# Patient Record
Sex: Female | Born: 1940
Health system: Southern US, Community
[De-identification: ages and names within clinical notes are randomized; demographics above are authoritative.]

## PROBLEM LIST (undated history)

## (undated) DIAGNOSIS — I6529 Occlusion and stenosis of unspecified carotid artery: Secondary | ICD-10-CM

## (undated) DIAGNOSIS — R011 Cardiac murmur, unspecified: Secondary | ICD-10-CM

## (undated) DIAGNOSIS — I639 Cerebral infarction, unspecified: Secondary | ICD-10-CM

## (undated) DIAGNOSIS — K920 Hematemesis: Secondary | ICD-10-CM

## (undated) DIAGNOSIS — G8190 Hemiplegia, unspecified affecting unspecified side: Secondary | ICD-10-CM

## (undated) DIAGNOSIS — E785 Hyperlipidemia, unspecified: Secondary | ICD-10-CM

## (undated) DIAGNOSIS — K2970 Gastritis, unspecified, without bleeding: Secondary | ICD-10-CM

## (undated) DIAGNOSIS — E079 Disorder of thyroid, unspecified: Secondary | ICD-10-CM

## (undated) DIAGNOSIS — K219 Gastro-esophageal reflux disease without esophagitis: Secondary | ICD-10-CM

## (undated) DIAGNOSIS — I2510 Atherosclerotic heart disease of native coronary artery without angina pectoris: Secondary | ICD-10-CM

## (undated) DIAGNOSIS — F349 Persistent mood [affective] disorder, unspecified: Secondary | ICD-10-CM

## (undated) DIAGNOSIS — I10 Essential (primary) hypertension: Secondary | ICD-10-CM

## (undated) DIAGNOSIS — K2210 Ulcer of esophagus without bleeding: Secondary | ICD-10-CM

## (undated) DIAGNOSIS — J309 Allergic rhinitis, unspecified: Secondary | ICD-10-CM

## (undated) DIAGNOSIS — I129 Hypertensive chronic kidney disease with stage 1 through stage 4 chronic kidney disease, or unspecified chronic kidney disease: Secondary | ICD-10-CM

## (undated) DIAGNOSIS — H3530 Unspecified macular degeneration: Secondary | ICD-10-CM

## (undated) DIAGNOSIS — F0390 Unspecified dementia without behavioral disturbance: Secondary | ICD-10-CM

## (undated) HISTORY — DX: Hemiplegia, unspecified affecting unspecified side: G81.90

## (undated) HISTORY — DX: Persistent mood (affective) disorder, unspecified: F34.9

## (undated) HISTORY — PX: TONSILLECTOMY: SUR1361

## (undated) HISTORY — DX: Hypertensive chronic kidney disease with stage 1 through stage 4 chronic kidney disease, or unspecified chronic kidney disease: I12.9

## (undated) HISTORY — DX: Atherosclerotic heart disease of native coronary artery without angina pectoris: I25.10

## (undated) HISTORY — PX: APPENDECTOMY: SHX54

## (undated) HISTORY — DX: Disorder of thyroid, unspecified: E07.9

## (undated) HISTORY — DX: Gastritis, unspecified, without bleeding: K29.70

## (undated) HISTORY — DX: Unspecified macular degeneration: H35.30

## (undated) HISTORY — DX: Hematemesis: K92.0

## (undated) HISTORY — DX: Occlusion and stenosis of unspecified carotid artery: I65.29

## (undated) HISTORY — DX: Ulcer of esophagus without bleeding: K22.10

## (undated) HISTORY — DX: Allergic rhinitis, unspecified: J30.9

## (undated) SURGERY — Surgical Case
Anesthesia: *Unknown

---

## 2003-06-11 ENCOUNTER — Encounter: Admission: RE | Admit: 2003-06-11 | Discharge: 2003-09-09 | Payer: Self-pay | Admitting: Internal Medicine

## 2003-09-13 ENCOUNTER — Encounter: Admission: RE | Admit: 2003-09-13 | Discharge: 2003-12-12 | Payer: Self-pay | Admitting: Internal Medicine

## 2004-01-01 ENCOUNTER — Encounter: Admission: RE | Admit: 2004-01-01 | Discharge: 2004-01-01 | Payer: Self-pay | Admitting: Internal Medicine

## 2004-02-25 ENCOUNTER — Encounter (INDEPENDENT_AMBULATORY_CARE_PROVIDER_SITE_OTHER): Payer: Self-pay | Admitting: *Deleted

## 2004-02-25 ENCOUNTER — Ambulatory Visit (HOSPITAL_COMMUNITY): Admission: RE | Admit: 2004-02-25 | Discharge: 2004-02-25 | Payer: Self-pay | Admitting: Gastroenterology

## 2004-08-02 ENCOUNTER — Emergency Department (HOSPITAL_COMMUNITY): Admission: EM | Admit: 2004-08-02 | Discharge: 2004-08-03 | Payer: Self-pay | Admitting: Emergency Medicine

## 2004-08-19 ENCOUNTER — Emergency Department (HOSPITAL_COMMUNITY): Admission: EM | Admit: 2004-08-19 | Discharge: 2004-08-19 | Payer: Self-pay | Admitting: Emergency Medicine

## 2005-07-03 ENCOUNTER — Encounter: Admission: RE | Admit: 2005-07-03 | Discharge: 2005-07-03 | Payer: Self-pay | Admitting: Internal Medicine

## 2006-08-24 ENCOUNTER — Encounter: Admission: RE | Admit: 2006-08-24 | Discharge: 2006-08-24 | Payer: Self-pay | Admitting: Internal Medicine

## 2007-08-13 ENCOUNTER — Emergency Department (HOSPITAL_COMMUNITY): Admission: EM | Admit: 2007-08-13 | Discharge: 2007-08-13 | Payer: Self-pay | Admitting: Emergency Medicine

## 2007-09-03 ENCOUNTER — Encounter: Admission: RE | Admit: 2007-09-03 | Discharge: 2007-09-03 | Payer: Self-pay | Admitting: Neurosurgery

## 2008-09-07 ENCOUNTER — Encounter: Admission: RE | Admit: 2008-09-07 | Discharge: 2008-09-07 | Payer: Self-pay | Admitting: Gastroenterology

## 2009-01-08 ENCOUNTER — Encounter: Admission: RE | Admit: 2009-01-08 | Discharge: 2009-01-08 | Payer: Self-pay | Admitting: Family Medicine

## 2009-01-16 ENCOUNTER — Encounter: Admission: RE | Admit: 2009-01-16 | Discharge: 2009-01-16 | Payer: Self-pay | Admitting: Family Medicine

## 2009-02-06 ENCOUNTER — Ambulatory Visit: Payer: Self-pay | Admitting: Vascular Surgery

## 2009-02-15 ENCOUNTER — Ambulatory Visit: Payer: Self-pay | Admitting: Vascular Surgery

## 2009-02-15 ENCOUNTER — Ambulatory Visit (HOSPITAL_COMMUNITY): Admission: RE | Admit: 2009-02-15 | Discharge: 2009-02-15 | Payer: Self-pay | Admitting: Vascular Surgery

## 2009-02-15 DIAGNOSIS — I6529 Occlusion and stenosis of unspecified carotid artery: Secondary | ICD-10-CM

## 2009-02-15 HISTORY — DX: Occlusion and stenosis of unspecified carotid artery: I65.29

## 2009-03-04 ENCOUNTER — Encounter: Admission: RE | Admit: 2009-03-04 | Discharge: 2009-03-04 | Payer: Self-pay | Admitting: Family Medicine

## 2009-08-28 ENCOUNTER — Ambulatory Visit: Payer: Self-pay | Admitting: Vascular Surgery

## 2009-12-05 ENCOUNTER — Encounter (HOSPITAL_BASED_OUTPATIENT_CLINIC_OR_DEPARTMENT_OTHER): Admission: RE | Admit: 2009-12-05 | Discharge: 2010-03-04 | Payer: Self-pay | Admitting: Internal Medicine

## 2009-12-23 ENCOUNTER — Ambulatory Visit: Payer: Self-pay | Admitting: Surgery

## 2010-03-25 ENCOUNTER — Encounter: Admission: RE | Admit: 2010-03-25 | Discharge: 2010-03-25 | Payer: Self-pay | Admitting: Family Medicine

## 2010-08-11 ENCOUNTER — Encounter: Payer: Self-pay | Admitting: Family Medicine

## 2010-09-11 ENCOUNTER — Other Ambulatory Visit: Payer: Self-pay

## 2010-09-25 ENCOUNTER — Other Ambulatory Visit (INDEPENDENT_AMBULATORY_CARE_PROVIDER_SITE_OTHER): Payer: Medicare Other

## 2010-09-25 DIAGNOSIS — I6529 Occlusion and stenosis of unspecified carotid artery: Secondary | ICD-10-CM

## 2010-10-03 NOTE — Procedures (Unsigned)
CAROTID DUPLEX EXAM  INDICATION:  Follow up carotid artery disease.  HISTORY: Diabetes:  Yes. Cardiac:  Heart murmur. Hypertension:  Yes. Smoking:  Previous. Previous Surgery:  No. CV History:  Patient is currently asymptomatic. Amaurosis Fugax No, Paresthesias No, Hemiparesis No.                                      RIGHT             LEFT Brachial systolic pressure:         160               162 Brachial Doppler waveforms:         WNL               WNL Vertebral direction of flow:        Antegrade         Antegrade DUPLEX VELOCITIES (cm/sec) CCA peak systolic                   33                55 ECA peak systolic                   106               107 ICA peak systolic                   Occluded          133 ICA end diastolic                                     48 PLAQUE MORPHOLOGY:                  Heterogenous      Heterogenous PLAQUE AMOUNT:                      Severe            Mild-to-moderate PLAQUE LOCATION:                    ICA               ICA, ECA  IMPRESSION: 1. Absent end-diastolic velocity within the right common carotid     artery, suggestive of stenosis distally. 2. Occluded right internal carotid with minimal flow detected     proximally. 3. Bilateral intimal thickening within the common carotid arteries. 4. Increased velocities within the right vertebral at 2.63 m/s,     suggestive of compensation flow due to occlusion.  The right     vertebral is prominent compared to the left. 5. The left carotid system appears hyperemic compared to the right,     suggestive of occlusion on the contralateral side. 6. The left internal carotid artery is 40% to 59% stenosis by Doppler     criteria distally, again suggestive of compensation due to     occlusion of the contralateral side. 7. Study is stable compared to previous.  ___________________________________________ Janetta Hora Fields, MD  OD/MEDQ  D:  09/25/2010  T:  09/25/2010   Job:  [id number]

## 2010-10-26 LAB — POCT I-STAT, CHEM 8
BUN: 29 mg/dL — ABNORMAL HIGH (ref 6–23)
Chloride: 106 mEq/L (ref 96–112)
Creatinine, Ser: 1.3 mg/dL — ABNORMAL HIGH (ref 0.4–1.2)
Glucose, Bld: 71 mg/dL (ref 70–99)
Hemoglobin: 14.3 g/dL (ref 12.0–15.0)
Potassium: 3.4 mEq/L — ABNORMAL LOW (ref 3.5–5.1)
Sodium: 141 mEq/L (ref 135–145)

## 2010-10-26 LAB — GLUCOSE, CAPILLARY

## 2010-12-02 NOTE — Op Note (Signed)
NAMEEVERLEY, EVORA              ACCOUNT NO.:  [id number]   MEDICAL RECORD NO.:  [id number]          PATIENT TYPE:  AMB   LOCATION:  SDS                          FACILITY:  [facility]   PHYSICIAN:  Charles E. Fields, MD  DATE OF BIRTH:  Dec 10, 1940   DATE OF PROCEDURE:  02/15/2009  DATE OF DISCHARGE:  02/15/2009                               OPERATIVE REPORT   PROCEDURES:  1. Arch aortogram.  2. Right and left carotid angiogram.   PREOPERATIVE DIAGNOSIS:  Possible right internal carotid artery  occlusion.   POSTOPERATIVE DIAGNOSIS:  Patent right internal carotid artery with  diffuse non-reconstructable distal disease.   INDICATIONS:  The patient has a history of prior stroke.  Recent carotid  duplex exam suggested a right internal carotid artery occlusion, but  this could not be definitively determined.  The patient presents today  for carotid angiogram for further delineation of her carotid anatomy.   OPERATIVE DETAILS:  After obtaining informed consent, the patient was  taken to the [facility] lab.  The patient was placed in supine position on the  angio table.  Right groin was prepped and draped in usual sterile  fashion.  Local anesthesia was infiltrated over the right common femoral  artery.  An introducer needle was used to cannulate the right common  femoral artery and a 0.035 Wholey wire advanced into the abdominal aorta  under fluoroscopic guidance.  Next, a 5-French sheath was placed over  the guidewire in the right common femoral artery.  This was thoroughly  flushed with heparinized saline.  A 5-French pigtail catheter was then  placed over the guidewire and these were advanced as a unit up into the  aortic arch.  Arch aortogram was obtained.  This shows a patent  innominate left common carotid and left subclavian artery.  The left  vertebral artery fills antegrade.  The right vertebral artery is quite  large approximately twice the diameter of the left vertebral artery, and  this was also patent.  The origin of the right common carotid artery is  also patent.   Next, the 5-French pigtail catheter was removed over guidewire and an H1  catheter advanced up into the aortic arch and the right innominate  artery was selectively catheterized.  Attempts were made using the H1  catheter to selectively catheterize the right common carotid artery;  however, the catheter kept advancing into the subclavian artery.  Therefore, the H1 catheter was removed over a guidewire and this was  exchanged for a Berenstein catheter.  The Berenstein catheter was easily  advanced up into the innominate artery and angiogram was performed,  which showed that the tip of the catheter was actually in the right  subclavian artery.  The vertebral artery was patent at its origin and  was quite large with multiple segments of 25%-50% stenosis in its  midportion.  The Berenstein catheter was thoroughly irrigated and  flushed after aspiration of blood to clear the catheter.  Catheter was  then pulled back down into the innominate artery and I was at this point  able to selectively catheterize the right  common carotid artery.  Selective injection of the right common carotid artery was obtained in  an AP and lateral projection.  Intracranial views in an AP and lateral  projection were also obtained.  Oblique views were also obtained of the  carotid artery to further define the anatomy.  This shows a patent right  common carotid artery and a patent, but diffusely diseased right  internal carotid artery.  The carotid bifurcation fills briskly.  However, the vessel distally is quite small and diffusely diseased.  There is minimal filling of the right cerebral hemisphere.  The vessel  is less than 1 mm in diameter and this diameter extends from the level  of the mandible all the way into the intracranial portion of the  internal carotid artery.  Again on several projections, there is minimal   bifurcation stenosis of the right carotid system, diffusely diseased  multisegmental stenosis, unreconstructable right internal carotid  artery.  The best view is the oblique view, which shows again a patent  right internal carotid artery with diffuse disease distally.   Next, the Berenstein catheter was pulled back down the aortic arch and  the left common carotid artery was selectively catheterized.  AP and  lateral projections were obtained of the carotid system as well as the  intracranial views.  The intracranial views to be interpreted later by  the neuroradiologist.  The left carotid bifurcation has minimal  stenosis.  There is a slight ulceration of the proximal aspect of this,  approximately 1 mm in depth.  The distal internal carotid artery is  relatively disease-free.   At this point, the Berenstein catheter was pulled back over a guidewire  and removed.  The 5-French sheath was thoroughly flushed with  heparinized saline.  Sheath was left in place to be pulled in the  holding area.  The patient tolerated the procedure well and there were  no complications.  The patient was taken to the holding area in stable  condition and at her neurologic baseline.   OPERATIVE FINDINGS:  1. Diffusely diseased right internal carotid artery with no      significant bifurcation stenosis, but unreconstructable distal      right internal carotid artery occlusive disease.  2. No significant left internal carotid artery stenosis.  3. Normal arch anatomy with no significant inflow stenosis.  4. Patent bilateral antegrade vertebral arteries with right vertebral      artery twice the diameter of the left vertebral artery and      dominant.      Janetta Hora. Fields, MD  Electronically Signed     CEF/MEDQ  D:  02/18/2009  T:  02/19/2009  Job:  [id number]

## 2010-12-02 NOTE — Assessment & Plan Note (Signed)
OFFICE VISIT   SANYIAH, KANZLER  DOB:  1940/10/13                                       02/06/2009  CHART#:[id number]   Ms. Krasner is a 70 year old female referred for evaluation of carotid  disease.  In 1998 she had a stroke which manifested itself as a right  hemiplegia.  She has no real significant residual at this point.  She  denies any symptoms of TIA, amaurosis or stroke recently.  She does have  a history of diabetes since 1998.  She has a history of smoking but quit  20 years ago.  Other atherosclerotic risk factors include elevated  cholesterol, hypertension.   PAST SURGICAL HISTORY:  She had a laser ablation in her right eye.  She  denies any other operations.   MEDICATIONS:  1. Glipizide 10 mg twice a day.  2. Metformin 500 with mg twice a day.  3. Pravastatin 20 mg half-tablet once a day.  4. Plavix 75 mg every other day.  5. Metoprolol 50 mg once a day.  6. Omeprazole 40 mg once a day.  7. Multivitamin.   ALLERGIES:  She states allergy to Allegra which causes swelling and  itching.   PAST MEDICAL HISTORY:  Also significant for macular degeneration.   FAMILY HISTORY:  Unremarkable.   SOCIAL HISTORY:  She is single.  Smoking history is as mentioned above.  She does not consume alcohol.   REVIEW OF SYSTEMS:  She is 4 feet 10 inches and 159 pounds.  She has had  some recent anorexia.  CARDIAC:  She has shortness of breath when lying flat occasionally.  GI:  She has some occasional dysphagia, diarrhea, constipation.  RENAL:  She has some urinary frequency.  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  ORTHOPEDIC:  She has multiple joint pain in her hands.  ENT:  She has some recent changes in her eye sight and hearing, both of  which are in decline.   PHYSICAL EXAM:  Vital signs:  Blood pressure 149/81 in the left arm,  138/67 right arm, pulse 75 and regular.  HEENT:  Unremarkable.   Neck has  2+ carotid pulses without bruit.  Chest:  Clear to auscultation.  Cardiac exam is regular rate and rhythm without murmur.  Abdomen:  Soft,  nontender, nondistended.  No masses.  Extremities:  She has 2+ brachial,  radial pulses bilaterally.  She has 2+ femoral pulses bilaterally.  She  has absent popliteal and pedal pulses bilaterally.  Neurologic:  Exam  shows upper extremity and lower extremity motor strength is essentially  5/5 with some slight discoordination of her right hand.  She also has a  mild right facial droop.   I reviewed her carotid duplex exam from [facility] dated January 16, 2009.  This suggested a 40-60% left internal carotid artery stenosis  with possible right internal carotid artery occlusion.  However, they  could not determine whether or not this was critical stenosis or  occlusion.   I believe the best option for Ms. Alton would be to do a carotid  arteriogram to make sure that the artery is occluded rather than just a  high-grade stenosis.  I did discuss with the patient and her daughter  today risks, benefits, possible complications of carotid arteriogram  including not limited to bleeding, infection, possible stroke.  They  understand and agree to proceed.  If the artery is occluded I do not  believe she would need any further intervention, if the artery has a  high-grade stenosis we need to consider carotid endarterectomy.  As far  as her lower extremities are concerned she did have absent popliteal and  pedal pulses bilaterally.  Does occasionally describe some pain in her  feet.  I do not believe she has critical limb ischemia but I believe  that she would benefit from bilateral ABIs and we will schedule this in  six months' time at time of a repeat carotid duplex exam.  We will  schedule a carotid duplex exam in advance, whether or not this would be  necessary will be based on her arteriographic findings.   Janetta Hora. Fields, MD   Electronically Signed   CEF/MEDQ  D:  02/07/2009  T:  02/07/2009  Job:  [id number]   cc:   Dr. Elizabeth Palau, [facility]

## 2010-12-02 NOTE — Procedures (Signed)
CAROTID DUPLEX EXAM   INDICATION:  Followup carotid artery disease.   HISTORY:  Diabetes:  Yes.  Cardiac:  No.  Hypertension:  Yes.  Smoking:  Previous.  Previous Surgery:  No.  CV History:  Stroke in 1998, asymptomatic now.  Amaurosis Fugax No, Paresthesias No, Hemiparesis No                                       RIGHT             LEFT  Brachial systolic pressure:         136               142  Brachial Doppler waveforms:         WNL               WNL  Vertebral direction of flow:        Antegrade         Antegrade  DUPLEX VELOCITIES (cm/sec)  CCA peak systolic                   42                67  ECA peak systolic                   79                109  ICA peak systolic                   55                131  ICA end diastolic                   8                 46  PLAQUE MORPHOLOGY:                  Homogeneous       Calcific  PLAQUE AMOUNT:                      Severe            Mild / moderate  PLAQUE LOCATION:                    ICA / ECA / CCA   ICA / ECA / CCA   IMPRESSION:  1. Right internal carotid artery shows evidence of narrow flow channel      with low, abnormal flow, consistent with angiogram 02/15/2009.  2. Left internal carotid artery shows evidence of 40%-59% stenosis      (low end of range).   ___________________________________________  Janetta Hora Fields, MD   AS/MEDQ  D:  08/28/2009  T:  08/28/2009  Job:  [id number]

## 2010-12-02 NOTE — Procedures (Signed)
DUPLEX DEEP VENOUS EXAM - LOWER EXTREMITY   INDICATION:  Questionable venous insufficiency.   HISTORY:  Edema:  No.  Trauma/Surgery:  Bruises on bilateral knees from a fall.  Pain:  Yes.  PE:  No.  Previous DVT:  No.  Anticoagulants:  No.  Other:  No.   DUPLEX EXAM:                CFV   SFV   PopV  PTV    GSV                R  L  R  L  R  L  R   L  R  L  Thrombosis    o  o  o  o  o  o  o   o  o  o  Spontaneous   +  +  +  +  +  +  +   +  +  +  Phasic        +  +  +  +  +  +  +   +  +  +  Augmentation  +  +  +  +  +  +  +   +  +  +  Compressible  +  +  +  +  +  +  +   +  +  +  Competent     +  +  +  +  +  +  +   +  +  0   Legend:  + - yes  o - no  p - partial  D - decreased   IMPRESSION:  There does not appear to be any evidence of deep venous  thrombus noted in the bilateral lower extremities.  There does appear to  be reflux noted in the left greater saphenous vein.    _____________________________  V. Charlena Cross, MD   CB/MEDQ  D:  12/23/2009  T:  12/23/2009  Job:  [id number]

## 2010-12-02 NOTE — Consult Note (Signed)
NAMELEGACY, CARRENDER              ACCOUNT NO.:  [id number]   MEDICAL RECORD NO.:  [id number]          PATIENT TYPE:  AMB   LOCATION:  SDS                          FACILITY:  [facility]   PHYSICIAN:  Marin Roberts, MDDATE OF BIRTH:  Aug 28, 1940   DATE OF CONSULTATION:  DATE OF DISCHARGE:  02/15/2009                                 CONSULTATION   REFERRING PHYSICIAN:  Janetta Hora. Fields, MD   REASON FOR CONSULTATION:  Intracranial interpretation of bilateral  carotid arteriogram, performed on February 15, 2009.   FINDINGS:  Injection of the right common carotid artery demonstrates a  markedly narrowed high cervical and skull base ICA.  There is focal  narrowing within the petrous segment, which may be slightly greater than  50%.  The supraclinoid right internal carotid artery terminates into  small branch vessels.  These do not have the appearance of the middle  cerebral artery, but rather small collateral vessels, compatible with a  moyamoya picture.  The ophthalmic artery is visualized.  No significant  A1 or M1 segment is noted.  The right external carotid artery branches  are intact.  There is no significant pial collateralization.  There is  some blush of contrast distal to the ICA, again compatible with moyamoya  picture.   Injection of the left common carotid artery demonstrates a normal distal  ICA segment within the skull base.  The A1 and M1 segments are normal.  The anterior communicating artery is patent.  The distal ACA vessels on  the right are normal.  There is some collateralization to MCA branch  vessels from the right ACA vessels.  The dural sinuses fill normally.  The left MCA branch vessels are unremarkable.   IMPRESSIONS:  1. Essentially occluded distal ICA at the level of the ICA terminus      with 2 small branch vessels and some blush, suggesting moyamoya.  2. Diffuse narrowing of the entire internal carotid artery, likely due      to a low-perfusion state  relative to the distal occlusion.  3. Potential high-grade stenosis within the petrous segment.  This is      not the limiting lesion.  4. Collateral flow to MCA branches from the right ACA branches which      fill via a patent anterior communicating artery.  The collateral      flow is via pial collaterals.  No intact right A1 segment is      identified.  5. No significant left-sided disease.      Marin Roberts, MD  Electronically Signed     CM/MEDQ  D:  02/15/2009  T:  02/16/2009  Job:  [id number]

## 2010-12-05 NOTE — Op Note (Signed)
[name] [name], [name] [name]                          ACCOUNT NO.:  [id number]   MEDICAL RECORD NO.:  [id number]                   PATIENT TYPE:  AMB   LOCATION:  ENDO                                 FACILITY:  [facility]   PHYSICIAN:  Anselmo Rod, M.D.               DATE OF BIRTH:  12-20-1940   DATE OF PROCEDURE:  02/25/2004  DATE OF DISCHARGE:                                 OPERATIVE REPORT   PROCEDURE PERFORMED:  Colonoscopy with biopsies x3.   ENDOSCOPIST:  Anselmo Rod, M.D.   INSTRUMENT USED:  Olympus video colonoscope.   INDICATION FOR PROCEDURE:  A 70 year old African-American female undergoing  a screening colonoscopy.  Rule out colonic polyps, masses, etc.   PREPROCEDURE PREPARATION:  Informed consent was procured from the patient.  The patient was fasted for eight hours prior to the procedure and prepped  with a bottle of magnesium citrate and a gallon of GoLYTELY the night prior  to the procedure.   PREPROCEDURE PHYSICAL:  VITAL SIGNS:  The patient had stable vital signs.  NECK:  Supple.  CHEST:  Clear to auscultation.  S1, S2 regular.  ABDOMEN:  Soft with normal bowel sounds.   DATE OF PROCEDURE:  The patient was placed in the left lateral decubitus  position and sedated with 60 mg of Demerol and 6 mg of Versed in slow  incremental doses.  Once the patient was adequately sedate and maintained on  low-flow oxygen and continuous cardiac monitoring, the Olympus video  colonoscope was advanced from the rectum to the cecum.  The appendiceal  orifice and the ileocecal valve were clearly visualized and photographed.  There was a large amount of residual stool in the colon.  Multiple washes  were done.  The patient's position was changed from the left lateral to the  supine position with gentle application of abdominal pressure to visualize  the rest of the colonic mucosa.  A small sessile polyp was biopsied from the  cecal base, but no other abnormalities were noted.   Retroflexion in the  rectum revealed small internal hemorrhoids.  There was no evidence of  diverticulosis.  The patient tolerated the procedure well without  complications.   IMPRESSION:  1. Small, nonbleeding internal hemorrhoids.  2. Small sessile polyp biopsied from the cecal base.  3. Large amount of residual stool in the colon, small lesions could have     been missed.   RECOMMENDATIONS:  1. Await pathology results.  2. Avoid nonsteroidals, including aspirin, for the next two weeks.  3. Outpatient follow-up in the next two weeks for further recommendations.     The patient is advised to restart her Plavix on February 27, 2004.  Anselmo Rod, M.D.    JNM/MEDQ  D:  02/25/2004  T:  02/25/2004  Job:  [id number]   cc:   Merlene Laughter. Renae Gloss, M.D.  [address]  [address]  [place]  [place] [address]  Fax: [phone]

## 2011-03-02 ENCOUNTER — Other Ambulatory Visit: Payer: Self-pay | Admitting: Family Medicine

## 2011-03-02 DIAGNOSIS — Z1231 Encounter for screening mammogram for malignant neoplasm of breast: Secondary | ICD-10-CM

## 2011-03-31 ENCOUNTER — Ambulatory Visit
Admission: RE | Admit: 2011-03-31 | Discharge: 2011-03-31 | Disposition: A | Discharge status: Home / Self Care | Payer: Medicare Other | Source: Ambulatory Visit | Attending: Family Medicine | Admitting: Family Medicine

## 2011-03-31 DIAGNOSIS — Z1231 Encounter for screening mammogram for malignant neoplasm of breast: Secondary | ICD-10-CM

## 2011-04-10 LAB — CBC
Hemoglobin: 13.2
MCV: 82.4
RBC: 4.7
WBC: 7.3

## 2011-04-10 LAB — BASIC METABOLIC PANEL
Chloride: 107
Creatinine, Ser: 1.14
GFR calc Af Amer: 58 — ABNORMAL LOW
Sodium: 140

## 2011-04-20 ENCOUNTER — Ambulatory Visit: Payer: Medicare Other | Attending: Family Medicine

## 2011-04-20 DIAGNOSIS — R5381 Other malaise: Secondary | ICD-10-CM | POA: Insufficient documentation

## 2011-04-20 DIAGNOSIS — R262 Difficulty in walking, not elsewhere classified: Secondary | ICD-10-CM | POA: Insufficient documentation

## 2011-04-20 DIAGNOSIS — IMO0001 Reserved for inherently not codable concepts without codable children: Secondary | ICD-10-CM | POA: Insufficient documentation

## 2011-05-27 ENCOUNTER — Emergency Department (HOSPITAL_COMMUNITY)
Admission: EM | Admit: 2011-05-27 | Discharge: 2011-05-27 | Disposition: A | Discharge status: Home / Self Care | Payer: Medicare Other | Attending: Emergency Medicine | Admitting: Emergency Medicine

## 2011-05-27 ENCOUNTER — Emergency Department (HOSPITAL_COMMUNITY): Payer: Medicare Other

## 2011-05-27 DIAGNOSIS — Z8673 Personal history of transient ischemic attack (TIA), and cerebral infarction without residual deficits: Secondary | ICD-10-CM | POA: Insufficient documentation

## 2011-05-27 DIAGNOSIS — E1165 Type 2 diabetes mellitus with hyperglycemia: Secondary | ICD-10-CM

## 2011-05-27 DIAGNOSIS — I10 Essential (primary) hypertension: Secondary | ICD-10-CM | POA: Insufficient documentation

## 2011-05-27 DIAGNOSIS — E119 Type 2 diabetes mellitus without complications: Secondary | ICD-10-CM | POA: Insufficient documentation

## 2011-05-27 DIAGNOSIS — N289 Disorder of kidney and ureter, unspecified: Secondary | ICD-10-CM | POA: Insufficient documentation

## 2011-05-27 DIAGNOSIS — R011 Cardiac murmur, unspecified: Secondary | ICD-10-CM | POA: Insufficient documentation

## 2011-05-27 HISTORY — DX: Gastro-esophageal reflux disease without esophagitis: K21.9

## 2011-05-27 HISTORY — DX: Essential (primary) hypertension: I10

## 2011-05-27 HISTORY — DX: Cerebral infarction, unspecified: I63.9

## 2011-05-27 HISTORY — DX: Hyperlipidemia, unspecified: E78.5

## 2011-05-27 LAB — BASIC METABOLIC PANEL
BUN: 40 mg/dL — ABNORMAL HIGH (ref 6–23)
Chloride: 96 mEq/L (ref 96–112)
Creatinine, Ser: 1.36 mg/dL — ABNORMAL HIGH (ref 0.50–1.10)
GFR calc non Af Amer: 38 mL/min — ABNORMAL LOW (ref >90)
Glucose, Bld: 479 mg/dL — ABNORMAL HIGH (ref 70–99)
Potassium: 4 mEq/L (ref 3.5–5.1)

## 2011-05-27 LAB — URINALYSIS, ROUTINE W REFLEX MICROSCOPIC
Bilirubin Urine: NEGATIVE
Ketones, ur: 15 mg/dL — AB
Specific Gravity, Urine: 1.01 (ref 1.005–1.030)
pH: 6 (ref 5.0–8.0)

## 2011-05-27 LAB — DIFFERENTIAL
Lymphs Abs: 1.5 10*3/uL (ref 0.7–4.0)
Monocytes Relative: 7 % (ref 3–12)
Neutro Abs: 4.2 10*3/uL (ref 1.7–7.7)
Neutrophils Relative %: 67 % (ref 43–77)

## 2011-05-27 LAB — CBC
HCT: 39.6 % (ref 36.0–46.0)
Hemoglobin: 13.5 g/dL (ref 12.0–15.0)
MCH: 27.7 pg (ref 26.0–34.0)
RBC: 4.87 MIL/uL (ref 3.87–5.11)

## 2011-05-27 LAB — GLUCOSE, CAPILLARY: Glucose-Capillary: 486 mg/dL — ABNORMAL HIGH (ref 70–99)

## 2011-05-27 LAB — URINE MICROSCOPIC-ADD ON

## 2011-05-27 MED ORDER — SODIUM CHLORIDE 0.9 % IV BOLUS (SEPSIS)
1000.0000 mL | Freq: Once | INTRAVENOUS | Status: AC
  Administered 2011-05-27: 1000 mL via INTRAVENOUS

## 2011-05-27 MED ORDER — INSULIN GLARGINE 100 UNIT/ML ~~LOC~~ SOLN
SUBCUTANEOUS | Status: AC
  Filled 2011-05-27: qty 1

## 2011-05-27 MED ORDER — SODIUM CHLORIDE 0.9 % IV BOLUS (SEPSIS)
500.0000 mL | Freq: Once | INTRAVENOUS | Status: AC
  Administered 2011-05-27 (×2): 500 mL via INTRAVENOUS

## 2011-05-27 MED ORDER — INSULIN REGULAR HUMAN 100 UNIT/ML IJ SOLN
10.0000 [IU] | Freq: Once | INTRAMUSCULAR | Status: AC
  Administered 2011-05-27: 10 [IU] via INTRAVENOUS

## 2011-05-27 NOTE — ED Notes (Signed)
Daughter at bedside left to do blood work and will return

## 2011-05-27 NOTE — ED Notes (Signed)
Family at bedside. 

## 2011-05-27 NOTE — ED Provider Notes (Signed)
History     CSN: [id number] Arrival date & time: 05/27/2011  9:43 AM   First MD Initiated Contact with Patient 05/27/11 1006      Chief Complaint  Patient presents with  . Hyperglycemia    (Consider location/radiation/quality/duration/timing/severity/associated sxs/prior treatment) The history is provided by the patient and a relative.  Pt is a 70 y/o F with long-standing hx Type 2 DM and HTN who c/o high blood sugar over the last several days. She does not take insulin regularly but does take glipizide and [place], which she states she has been compliant with taking. Her blood sugar typically runs in the 170s or lower but has been above 300 over the last several days. There has been no change in her diet, which she reports is "fairly health". No known recent illness. Denies chest pain, nausea, vomiting, abdominal pain. The patient denies any recent change in vision or numbness to her feet.   Past Medical History  Diagnosis Date  . Diabetes mellitus   . Hypertension   . CVA (cerebral infarction)   . GERD (gastroesophageal reflux disease)   . HLD (hyperlipidemia)     History reviewed. No pertinent past surgical history.  No family history on file.  History  Substance Use Topics  . Smoking status: Never Smoker   . Smokeless tobacco: Not on file  . Alcohol Use: No     Review of Systems  Constitutional: Negative for fever, chills and appetite change.  HENT: Negative for congestion, sore throat, neck pain and tinnitus.   Eyes: Negative for pain and redness.  Respiratory: Negative for cough, chest tightness and shortness of breath.   Cardiovascular: Negative for chest pain and leg swelling.  Gastrointestinal: Negative for nausea, vomiting, abdominal pain, diarrhea and constipation.  Genitourinary: Negative for dysuria and hematuria.  Skin: Negative for rash and wound.  Neurological: Positive for headaches. Negative for dizziness, weakness and numbness.  Hematological: Does  not bruise/bleed easily.  Psychiatric/Behavioral: Negative for behavioral problems and confusion.    Allergies  Allegra  Home Medications   Current Outpatient Rx  Name Route Sig Dispense Refill  . ACETAMINOPHEN ER 650 MG PO TBCR Oral Take 1,300 mg by mouth every 8 (eight) hours as needed. pain     . GLIPIZIDE 10 MG PO TABS Oral Take 20 mg by mouth daily.     Marland Kitchen HYDROCHLOROTHIAZIDE 25 MG PO TABS Oral Take 25 mg by mouth daily.      Marland Kitchen LOSARTAN POTASSIUM 50 MG PO TABS Oral Take 50 mg by mouth daily.      Marland Kitchen LOVASTATIN 20 MG PO TABS Oral Take 20 mg by mouth daily.      Marland Kitchen OMEPRAZOLE 40 MG PO CPDR Oral Take 40 mg by mouth daily.      Marland Kitchen SITAGLIPTIN PHOSPHATE 50 MG PO TABS Oral Take 50 mg by mouth daily.        BP 163/42  Pulse 92  Temp(Src) 97.9 F (36.6 C) (Oral)  SpO2 97%  Physical Exam  Constitutional: She is oriented to person, place, and time. She appears well-developed and well-nourished. No distress.  HENT:  Head: Normocephalic and atraumatic.  Eyes: EOM are normal. Pupils are equal, round, and reactive to light.  Neck: Normal range of motion. Neck supple.  Cardiovascular: Normal rate and regular rhythm.   Murmur heard.  Systolic murmur is present  Pulmonary/Chest: Effort normal and breath sounds normal. She has no wheezes.  Abdominal: Soft. Bowel sounds are normal. She exhibits no  distension. There is no tenderness.  Musculoskeletal: She exhibits no edema and no tenderness.  Lymphadenopathy:    She has no cervical adenopathy.  Neurological: She is alert and oriented to person, place, and time. She has normal strength. No cranial nerve deficit or sensory deficit. Coordination normal.  Skin: Skin is warm and dry. No rash noted.  Psychiatric: She has a normal mood and affect. Her behavior is normal.    ED Course  Procedures (including critical care time)  Labs Reviewed  GLUCOSE, CAPILLARY - Abnormal; Notable for the following:    Glucose-Capillary 486 (*)    All other  components within normal limits  BASIC METABOLIC PANEL - Abnormal; Notable for the following:    Sodium 133 (*)    Glucose, Bld 479 (*)    BUN 40 (*)    Creatinine, Ser 1.36 (*)    GFR calc non Af Amer 38 (*)    GFR calc Af Amer 45 (*)    All other components within normal limits  URINALYSIS, ROUTINE W REFLEX MICROSCOPIC - Abnormal; Notable for the following:    Color, Urine STRAW (*)    Glucose, UA >1000 (*)    Hgb urine dipstick TRACE (*)    Ketones, ur 15 (*)    All other components within normal limits  GLUCOSE, CAPILLARY - Abnormal; Notable for the following:    Glucose-Capillary 455 (*)    All other components within normal limits  URINE MICROSCOPIC-ADD ON - Abnormal; Notable for the following:    Bacteria, UA FEW (*)    All other components within normal limits  GLUCOSE, CAPILLARY - Abnormal; Notable for the following:    Glucose-Capillary 224 (*)    All other components within normal limits  CBC  DIFFERENTIAL  POCT CBG MONITORING  HEMOGLOBIN A1C   Dg Chest 2 View  05/27/2011  *RADIOLOGY REPORT*  Clinical Data: Shortness of breath, weakness  CHEST - 2 VIEW  Comparison: None  Findings: Atheromatous aorta.  Heart size upper limits normal. Lungs are clear.  No effusion.  Minimal spondylitic changes in the mid thoracic spine.  IMPRESSION: 1.  No acute disease  Original Report Authenticated By: Thora Lance III, M.D.     1. Type 2 diabetes mellitus with hyperglycemia   2. Renal insufficiency       MDM  Pt with hx of T2DM with no insulin tx presented with hyperglycemia. Trending blood sugars increased over last couple of weeks. Responded appropriately to IV insulin and NS bolus. Also with slight renal insufficieny with hx of same. Spoke with Dr Zachery Dauer (pt PMD) who will see pt in office tomorrow to re-evaluate medications and add insulin therapy.        Elwyn Reach [place], [place] 05/27/11 [phone]

## 2011-05-27 NOTE — ED Notes (Signed)
Pt's CBG 224 not 252 as charted

## 2011-05-27 NOTE — ED Notes (Signed)
Per ems...the patient requested transport due to high blood sugar.  EMS cbg 507.  Last night had nausea but has hx GERD.  20 g left AC.  300 cc NS in.

## 2011-05-27 NOTE — ED Notes (Addendum)
Pt c/o elevated blood sugar x2 days, pt noticed her sugar was "mildly" elevated yesterday, pt used at home medication as prescribed w/no relief, pt checked her BS this am and noticed it continued to elevate and felt her sugar was "too high." Pt also c/o fatigue, weakness, polyuria, and increased thirst x2 wks. Pt sts "my sugar has just been off for the last 2 wks."

## 2011-05-27 NOTE — ED Notes (Signed)
AVW:[id number]<XB> Expected date:05/27/11<BR> Expected time: 9:21 AM<BR> Means of arrival:Ambulance<BR> Comments:<BR>

## 2011-05-27 NOTE — ED Notes (Signed)
Pt's CBG has been >300 for the past few days.  EMS today was 507.  No N/V/D. No pain.

## 2011-05-27 NOTE — ED Provider Notes (Signed)
Medical screening examination/treatment/procedure(s) were performed by non-physician practitioner and as supervising physician I was immediately available for consultation/collaboration.   Lyanne Co, MD 05/27/11 [phone]

## 2011-05-27 NOTE — ED Notes (Signed)
Patient is resting comfortably. 

## 2011-09-22 ENCOUNTER — Encounter (HOSPITAL_COMMUNITY): Payer: Self-pay | Admitting: Emergency Medicine

## 2011-09-22 ENCOUNTER — Emergency Department (HOSPITAL_COMMUNITY): Payer: Medicare Other

## 2011-09-22 ENCOUNTER — Encounter (HOSPITAL_COMMUNITY): Payer: Self-pay | Admitting: *Deleted

## 2011-09-22 ENCOUNTER — Emergency Department (INDEPENDENT_AMBULATORY_CARE_PROVIDER_SITE_OTHER)
Admission: EM | Admit: 2011-09-22 | Discharge: 2011-09-22 | Disposition: A | Discharge status: Nursing Home (Includes ICF) | Payer: Medicare Other | Source: Home / Self Care | Attending: Emergency Medicine | Admitting: Emergency Medicine

## 2011-09-22 ENCOUNTER — Inpatient Hospital Stay (HOSPITAL_COMMUNITY)
Admission: EM | Admit: 2011-09-22 | Discharge: 2011-09-28 | DRG: 074 | Disposition: A | Discharge status: Skilled Nursing Facility | Payer: Medicare Other | Attending: Internal Medicine | Admitting: Internal Medicine

## 2011-09-22 DIAGNOSIS — Z79899 Other long term (current) drug therapy: Secondary | ICD-10-CM

## 2011-09-22 DIAGNOSIS — K3184 Gastroparesis: Secondary | ICD-10-CM | POA: Diagnosis present

## 2011-09-22 DIAGNOSIS — N189 Chronic kidney disease, unspecified: Secondary | ICD-10-CM | POA: Diagnosis present

## 2011-09-22 DIAGNOSIS — E1149 Type 2 diabetes mellitus with other diabetic neurological complication: Principal | ICD-10-CM | POA: Diagnosis present

## 2011-09-22 DIAGNOSIS — R112 Nausea with vomiting, unspecified: Secondary | ICD-10-CM | POA: Diagnosis present

## 2011-09-22 DIAGNOSIS — E860 Dehydration: Secondary | ICD-10-CM

## 2011-09-22 DIAGNOSIS — I10 Essential (primary) hypertension: Secondary | ICD-10-CM | POA: Diagnosis present

## 2011-09-22 DIAGNOSIS — E876 Hypokalemia: Secondary | ICD-10-CM | POA: Diagnosis not present

## 2011-09-22 DIAGNOSIS — I129 Hypertensive chronic kidney disease with stage 1 through stage 4 chronic kidney disease, or unspecified chronic kidney disease: Secondary | ICD-10-CM | POA: Diagnosis present

## 2011-09-22 DIAGNOSIS — K219 Gastro-esophageal reflux disease without esophagitis: Secondary | ICD-10-CM | POA: Diagnosis present

## 2011-09-22 DIAGNOSIS — E785 Hyperlipidemia, unspecified: Secondary | ICD-10-CM | POA: Diagnosis present

## 2011-09-22 DIAGNOSIS — E119 Type 2 diabetes mellitus without complications: Secondary | ICD-10-CM | POA: Diagnosis present

## 2011-09-22 DIAGNOSIS — Z8673 Personal history of transient ischemic attack (TIA), and cerebral infarction without residual deficits: Secondary | ICD-10-CM

## 2011-09-22 DIAGNOSIS — E1310 Other specified diabetes mellitus with ketoacidosis without coma: Secondary | ICD-10-CM | POA: Diagnosis present

## 2011-09-22 DIAGNOSIS — N390 Urinary tract infection, site not specified: Secondary | ICD-10-CM

## 2011-09-22 HISTORY — DX: Cardiac murmur, unspecified: R01.1

## 2011-09-22 HISTORY — DX: Cerebral infarction, unspecified: I63.9

## 2011-09-22 LAB — POCT URINALYSIS DIP (DEVICE)
Bilirubin Urine: NEGATIVE
Glucose, UA: 500 mg/dL — AB
Ketones, ur: 80 mg/dL — AB
Leukocytes, UA: NEGATIVE
Nitrite: NEGATIVE
Protein, ur: 30 mg/dL — AB
Specific Gravity, Urine: 1.015 (ref 1.005–1.030)
Urobilinogen, UA: 0.2 mg/dL (ref 0.0–1.0)
pH: 6 (ref 5.0–8.0)

## 2011-09-22 LAB — COMPREHENSIVE METABOLIC PANEL WITH GFR
ALT: 16 U/L (ref 0–35)
AST: 20 U/L (ref 0–37)
Albumin: 4 g/dL (ref 3.5–5.2)
Alkaline Phosphatase: 105 U/L (ref 39–117)
BUN: 19 mg/dL (ref 6–23)
CO2: 22 meq/L (ref 19–32)
Calcium: 9.6 mg/dL (ref 8.4–10.5)
Chloride: 98 meq/L (ref 96–112)
Creatinine, Ser: 0.94 mg/dL (ref 0.50–1.10)
GFR calc Af Amer: 69 mL/min — ABNORMAL LOW
GFR calc non Af Amer: 60 mL/min — ABNORMAL LOW
Glucose, Bld: 315 mg/dL — ABNORMAL HIGH (ref 70–99)
Potassium: 3.6 meq/L (ref 3.5–5.1)
Sodium: 137 meq/L (ref 135–145)
Total Bilirubin: 0.5 mg/dL (ref 0.3–1.2)
Total Protein: 8.3 g/dL (ref 6.0–8.3)

## 2011-09-22 LAB — POCT I-STAT 3, ART BLOOD GAS (G3+)
Acid-base deficit: 3 mmol/L — ABNORMAL HIGH (ref 0.0–2.0)
O2 Saturation: 96 %
TCO2: 21 mmol/L (ref 0–100)
pCO2 arterial: 32.4 mmHg — ABNORMAL LOW (ref 35.0–45.0)

## 2011-09-22 LAB — GLUCOSE, CAPILLARY

## 2011-09-22 LAB — DIFFERENTIAL
Eosinophils Absolute: 0 10*3/uL (ref 0.0–0.7)
Eosinophils Relative: 0 % (ref 0–5)
Lymphs Abs: 0.7 10*3/uL (ref 0.7–4.0)

## 2011-09-22 LAB — LACTIC ACID, PLASMA: Lactic Acid, Venous: 1.9 mmol/L (ref 0.5–2.2)

## 2011-09-22 LAB — CBC
MCH: 28.6 pg (ref 26.0–34.0)
MCV: 80.5 fL (ref 78.0–100.0)
Platelets: 178 10*3/uL (ref 150–400)
RBC: 5.18 MIL/uL — ABNORMAL HIGH (ref 3.87–5.11)

## 2011-09-22 LAB — POCT I-STAT TROPONIN I: Troponin i, poc: 0 ng/mL (ref 0.00–0.08)

## 2011-09-22 MED ORDER — ONDANSETRON HCL 4 MG PO TABS: 4.0000 mg | ORAL_TABLET | Freq: Four times a day (QID) | ORAL | Status: DC

## 2011-09-22 MED ORDER — ONDANSETRON 4 MG PO TBDP
4.0000 mg | ORAL_TABLET | Freq: Once | ORAL | Status: AC
  Administered 2011-09-22: 4 mg via ORAL

## 2011-09-22 MED ORDER — SODIUM CHLORIDE 0.9 % IV SOLN
INTRAVENOUS | Status: DC
  Administered 2011-09-22: 15:00:00 via INTRAVENOUS

## 2011-09-22 MED ORDER — SODIUM CHLORIDE 0.9 % IV BOLUS (SEPSIS)
1000.0000 mL | Freq: Once | INTRAVENOUS | Status: AC
  Administered 2011-09-22: 1000 mL via INTRAVENOUS

## 2011-09-22 MED ORDER — ONDANSETRON HCL 4 MG/2ML IJ SOLN
4.0000 mg | Freq: Once | INTRAMUSCULAR | Status: AC
  Administered 2011-09-22: 4 mg via INTRAVENOUS
  Filled 2011-09-22: qty 2

## 2011-09-22 MED ORDER — GI COCKTAIL ~~LOC~~
30.0000 mL | Freq: Once | ORAL | Status: AC
  Administered 2011-09-22: 30 mL via ORAL
  Filled 2011-09-22: qty 30

## 2011-09-22 MED ORDER — ONDANSETRON 4 MG PO TBDP
ORAL_TABLET | ORAL | Status: AC
  Filled 2011-09-22: qty 1

## 2011-09-22 NOTE — ED Notes (Signed)
[id number] Ready 

## 2011-09-22 NOTE — ED Notes (Signed)
[facility] here for transport

## 2011-09-22 NOTE — ED Notes (Signed)
Pt was brought in by [facility] from the urgent care with HTN and elevated blood gulcose. Pt alert, awake, oriented x4, skin is warm and dry, respiration is even and unlabored.

## 2011-09-22 NOTE — ED Provider Notes (Signed)
Chief Complaint  Patient presents with  . Emesis    History of Present Illness:   Mrs. [name] [name] is a 71 year old female with hypertension and diabetes. Since yesterday she had nausea and vomiting all by mouth intake. She has some lower abdominal pain, fever up to 100.3, chills, generalized weakness, fatigue, and slightly increased confusion. She's had a slight cough and shortness of breath. She denies any chest pain or hematemesis. She hasn't had any diarrhea. She denies any urinary symptoms. She has not been able to take any of her medications today including her insulin.  Review of Systems:  Other than noted above, the patient denies any of the following symptoms. Systemic:  No fever, chills, sweats, fatigue, myalgias, headache, or anorexia. Eye:  No redness, pain or drainage. ENT:  No earache, nasal congestion, rhinorrhea, sinus pressure, or sore throat. Lungs:  No cough, sputum production, wheezing, shortness of breath.  Cardiovascular:  No chest pain, palpitations, or syncope. GI:  No nausea, vomiting, abdominal pain or diarrhea. GU:  No dysuria, frequency, or hematuria. Skin:  No rash or pruritis.  PMFSH:  Past medical history, family history, social history, meds, and allergies were reviewed.  Physical Exam:   Vital signs:  BP 190/100  Pulse 102  Temp(Src) 99 F (37.2 C) (Oral)  Resp 22  SpO2 100% General:  Alert, in no distress, she appears very weak and listless. Eye:  PERRL, full EOMs.  Lids and conjunctivas were normal. ENT:  TMs and canals were normal, without erythema or inflammation.  Nasal mucosa was clear and uncongested, without drainage.  Mucous membranes were moist.  Pharynx was clear, without exudate or drainage.  There were no oral ulcerations or lesions. Neck:  Supple, no adenopathy, tenderness or mass. Thyroid was normal. Lungs:  No respiratory distress.  Lungs were clear to auscultation, without wheezes, rales or rhonchi.  Breath sounds were clear and equal  bilaterally. Heart:  Regular rhythm, without gallops, murmers or rubs. Abdomen:  Soft, flat, and non-tender to palpation.  No hepatosplenomagaly or mass. Skin:  Clear, warm, and dry, without rash or lesions.  Labs:   Results for orders placed during the hospital encounter of 09/22/11  GLUCOSE, CAPILLARY      Component Value Range   Glucose-Capillary 317 (*) 70 - 99 (mg/dL)   Comment 1 Documented in Chart     Comment 2 Notify RN    POCT URINALYSIS DIP (DEVICE)      Component Value Range   Glucose, UA 500 (*) NEGATIVE (mg/dL)   Bilirubin Urine NEGATIVE  NEGATIVE    Ketones, ur 80 (*) NEGATIVE (mg/dL)   Specific Gravity, Urine 1.015  1.005 - 1.030    Hgb urine dipstick TRACE (*) NEGATIVE    pH 6.0  5.0 - 8.0    Protein, ur 30 (*) NEGATIVE (mg/dL)   Urobilinogen, UA 0.2  0.0 - 1.0 (mg/dL)   Nitrite NEGATIVE  NEGATIVE    Leukocytes, UA NEGATIVE  NEGATIVE      Course in Urgent Care Center:   She was vomiting frequently at the urgent care Center, so she was given Zofran 4 mg sublingually. An IV was started with normal saline at 75 mL per hour and she will be transferred to the emergency department via [facility].   Assessment:   Diagnoses that have been ruled out:  None  Diagnoses that are still under consideration:  None  Final diagnoses:  UTI (lower urinary tract infection)  Dehydration  Hypertension  Type 2 diabetes mellitus  Plan:   1.  The patient will be sent to the emergency department via [facility].  Roque Lias, MD 09/22/11 1452

## 2011-09-22 NOTE — H&P (Signed)
[name] [name] is an 71 y.o. female.   PCP - Dr.Elizabeth Zachery Dauer.       Chief Complaint: Nausea and vomiting. HPI: 71 year female with history of diabetes mellitus type 2, hypertension, previous history of CVA, hyperlipidemia presented to the ER because of persistent nausea and vomiting over the last 2 days. Patient stated that she is not been able take anything in, as she throws up. In addition patient has been also having headache which is mostly in the frontal area but has no focal deficits or visual symptoms. Patient also has been experiencing some chills and fever. In the ER patient's exam was showing a benign abdomen x-rays did not show anything acute. Patient has been admitted for further workup. Patient denies any chest pain shortness of breath abdominal pain or diarrhea. Denies any focal deficits.  Past Medical History  Diagnosis Date  . Diabetes mellitus   . Hypertension   . CVA (cerebral infarction)   . GERD (gastroesophageal reflux disease)   . HLD (hyperlipidemia)   . Stroke     History reviewed. No pertinent past surgical history.  Family History  Problem Relation Age of Onset  . Diabetes Mother   . Diabetes Father   . Diabetes Sister    Social History:  reports that she has never smoked. She does not have any smokeless tobacco history on file. She reports that she does not drink alcohol or use illicit drugs.  Allergies:  Allergies  Allergen Reactions  . Allegra Rash    Medications Prior to Admission  Medication Dose Route Frequency Provider Last Rate Last Dose  . gi cocktail  30 mL Oral Once Particia Lather, MD   30 mL at 09/22/11 2013  . ondansetron (ZOFRAN) injection 4 mg  4 mg Intravenous Once Particia Lather, MD   4 mg at 09/22/11 1636  . ondansetron (ZOFRAN-ODT) disintegrating tablet 4 mg  4 mg Oral Once Roque Lias, MD   4 mg at 09/22/11 1421  . sodium chloride 0.9 % bolus 1,000 mL  1,000 mL Intravenous Once Particia Lather, MD   1,000 mL at 09/22/11 1641  .  DISCONTD: 0.9 %  sodium chloride infusion   Intravenous Continuous Roque Lias, MD 75 mL/hr at 09/22/11 1504     Medications Prior to Admission  Medication Sig Dispense Refill  . acetaminophen (TYLENOL ARTHRITIS PAIN) 650 MG CR tablet Take 650 mg by mouth every 8 (eight) hours as needed. For pain.      Marland Kitchen glipiZIDE (GLUCOTROL) 10 MG tablet Take 10 mg by mouth 2 (two) times daily.       . hydrochlorothiazide (HYDRODIURIL) 25 MG tablet Take 25 mg by mouth daily.        Marland Kitchen losartan (COZAAR) 50 MG tablet Take 50 mg by mouth daily.        Marland Kitchen lovastatin (MEVACOR) 20 MG tablet Take 20 mg by mouth daily.        Marland Kitchen omeprazole (PRILOSEC) 40 MG capsule Take 40 mg by mouth daily.          Results for orders placed during the hospital encounter of 09/22/11 (from the past 48 hour(s))  CBC     Status: Abnormal   Collection Time   09/22/11  4:31 PM      Component Value Range Comment   WBC 9.1  4.0 - 10.5 (K/uL)    RBC 5.18 (*) 3.87 - 5.11 (MIL/uL)    Hemoglobin 14.8  12.0 - 15.0 (g/dL)  HCT 41.7  36.0 - 46.0 (%)    MCV 80.5  78.0 - 100.0 (fL)    MCH 28.6  26.0 - 34.0 (pg)    MCHC 35.5  30.0 - 36.0 (g/dL)    RDW 16.1  09.6 - 04.5 (%)    Platelets 178  150 - 400 (K/uL)   DIFFERENTIAL     Status: Abnormal   Collection Time   09/22/11  4:31 PM      Component Value Range Comment   Neutrophils Relative 91 (*) 43 - 77 (%)    Neutro Abs 8.3 (*) 1.7 - 7.7 (K/uL)    Lymphocytes Relative 8 (*) 12 - 46 (%)    Lymphs Abs 0.7  0.7 - 4.0 (K/uL)    Monocytes Relative 1 (*) 3 - 12 (%)    Monocytes Absolute 0.1  0.1 - 1.0 (K/uL)    Eosinophils Relative 0  0 - 5 (%)    Eosinophils Absolute 0.0  0.0 - 0.7 (K/uL)    Basophils Relative 0  0 - 1 (%)    Basophils Absolute 0.0  0.0 - 0.1 (K/uL)   COMPREHENSIVE METABOLIC PANEL     Status: Abnormal   Collection Time   09/22/11  4:31 PM      Component Value Range Comment   Sodium 137  135 - 145 (mEq/L)    Potassium 3.6  3.5 - 5.1 (mEq/L)    Chloride 98  96 - 112  (mEq/L)    CO2 22  19 - 32 (mEq/L)    Glucose, Bld 315 (*) 70 - 99 (mg/dL)    BUN 19  6 - 23 (mg/dL)    Creatinine, Ser 4.09  0.50 - 1.10 (mg/dL)    Calcium 9.6  8.4 - 10.5 (mg/dL)    Total Protein 8.3  6.0 - 8.3 (g/dL)    Albumin 4.0  3.5 - 5.2 (g/dL)    AST 20  0 - 37 (U/L)    ALT 16  0 - 35 (U/L)    Alkaline Phosphatase 105  39 - 117 (U/L)    Total Bilirubin 0.5  0.3 - 1.2 (mg/dL)    GFR calc non Af Amer 60 (*) >90 (mL/min)    GFR calc Af Amer 69 (*) >90 (mL/min)   LACTIC ACID, PLASMA     Status: Normal   Collection Time   09/22/11  4:31 PM      Component Value Range Comment   Lactic Acid, Venous 1.9  0.5 - 2.2 (mmol/L)   POCT I-STAT TROPONIN I     Status: Normal   Collection Time   09/22/11  4:41 PM      Component Value Range Comment   Troponin i, poc 0.00  0.00 - 0.08 (ng/mL)    Comment 3            GLUCOSE, CAPILLARY     Status: Abnormal   Collection Time   09/22/11  4:51 PM      Component Value Range Comment   Glucose-Capillary 306 (*) 70 - 99 (mg/dL)    Dg Abd 1 View  02/17/1913  *RADIOLOGY REPORT*  Clinical Data: Fever, vomiting  ABDOMEN - 1 VIEW  Comparison: None.  Findings: Scattered air and stool throughout the bowel.  Negative for definite obstruction or ileus.  Lung bases clear. Atherosclerosis of the aorta and iliac bifurcation.  Mild degenerative changes of the spine.  No acute osseous finding.  No abnormal calcifications.  IMPRESSION: Nonobstructive bowel gas  pattern.  Original Report Authenticated By: Judie Petit. Ruel Favors, M.D.   Dg Chest Port 1 View  09/22/2011  *RADIOLOGY REPORT*  Clinical Data: Low grade fever and weakness.  PORTABLE CHEST - 1 VIEW  Comparison: Chest x-ray 05/27/2011.  Findings: Lung volumes are normal.  No consolidative airspace disease.  No pleural effusions.  No pneumothorax.  No pulmonary nodule or mass noted.  Pulmonary vasculature and the cardiomediastinal silhouette are within normal limits. Atherosclerotic calcifications within the aortic arch.  Multiple calcified granulomas in the apices bilaterally.  IMPRESSION: 1.  No radiographic evidence of acute cardiopulmonary disease. 2.  Atherosclerosis.  Original Report Authenticated By: Florencia Reasons, M.D.    Review of Systems  Constitutional: Positive for fever and chills.  Eyes: Negative.   Respiratory: Negative.   Cardiovascular: Negative.   Gastrointestinal: Positive for nausea and vomiting.  Genitourinary: Negative.   Musculoskeletal: Negative.   Skin: Negative.   Neurological: Positive for headaches.  Endo/Heme/Allergies: Negative.   Psychiatric/Behavioral: Negative.     Blood pressure 188/61, pulse 106, temperature 99.1 F (37.3 C), temperature source Oral, resp. rate 16, SpO2 100.00%. Physical Exam  Constitutional: She is oriented to person, place, and time. She appears well-developed and well-nourished. No distress.  HENT:  Head: Normocephalic and atraumatic.  Right Ear: External ear normal.  Left Ear: External ear normal.  Nose: Nose normal.  Mouth/Throat: Oropharynx is clear and moist. No oropharyngeal exudate.  Eyes: Conjunctivae are normal. Pupils are equal, round, and reactive to light. Right eye exhibits no discharge. Left eye exhibits no discharge. No scleral icterus.  Neck: Normal range of motion. Neck supple.  Cardiovascular: Normal rate and regular rhythm.   Respiratory: Effort normal and breath sounds normal. No respiratory distress. She has no wheezes. She has no rales.  GI: Soft. Bowel sounds are normal. She exhibits no distension. There is no tenderness. There is no rebound.  Musculoskeletal: Normal range of motion. She exhibits no edema and no tenderness.  Neurological: She is alert and oriented to person, place, and time.       Moves all extremities 5/5.  Skin: Skin is warm and dry. No rash noted. She is not diaphoretic. No erythema.  Psychiatric: Her behavior is normal.     Assessment/Plan #1. Nausea and vomiting - this most likely could be  viral. Her LFTs are normal. And she does not complain of any abdominal pain or diarrhea and abdomen on exam is benign. We will check a lipase level. At this time will place patient on clear liquid diet and advanced as tolerated. Hydrate to IV fluids. #2. Uncontrolled diabetes mellitus2 - patient does have mild anion gap and ketones in the urine. Her ABG shows a pH of 7.41. I think patient may not be in DKA but at this time and increase her Levemir dose from 7 units at bedtime to Lantus 10 units with sliding scale coverage and overnight and will check CBGs every 4 hours until a.m. after which may change to a.c. and at bedtime. Patient states he did not miss medications. We'll also continue hydration. #3. Headache - could be part of a viral process. We will check a CT head as her blood pressure is also uncontrolled. Check sedimentation rate. #4. Subjective feeling of fever and chills - patient is mildly febrile but has no leukocytosis or chest x-ray does not show any acute with urinalysis showing no features of UTI. At this time I will get blood cultures. #5. Uncontrolled hypertension  - continue her losartan  for now but hold off HCTZ. Patient will be on when necessary IV labetalol for systolic blood pressure more than 160. Her blood pressure may be uncontrolled due to the nausea and vomiting. #6. History of hyperlipidemia and previous stroke.  CODE STATUS  - full code.   Eduard Clos 09/22/2011, 9:51 PM

## 2011-09-22 NOTE — ED Provider Notes (Signed)
History     CSN: [id number]  Arrival date & time 09/22/11  1541   First MD Initiated Contact with Patient 09/22/11 1557      Chief Complaint  Patient presents with  . Fever    (Consider location/radiation/quality/duration/timing/severity/associated sxs/prior treatment) HPI History provided by the patient.  71 year old female with history of diabetes and hypertension presenting with complaint of nausea and vomiting. Patient's symptoms began gradually yesterday morning, have been constant, and moderate to severe. Patient reports frequent episodes of nausea and vomiting without bilious emesis or hematemesis, worse after eating. Patient reports associated generalized weakness, low-grade fever of 100.3 yesterday, poor PO intake with inability to take her medications, sweats and chills. Patient denies associated chest pain, shortness of breath, cough, abdominal pain, diarrhea, hematochezia, or urinary symptoms. Patient denies similar symptoms in the past and sick contacts. Patient was seen at urgent care earlier today, given Zofran ODT without significant improvement; thus, pt transferred here secondary to persistent vomiting and questionable UTI. Urgentcare UA with negative nitrite and leukocyte esterase and no micro performed; no documentation of antibiotics given.   Past Medical History  Diagnosis Date  . Diabetes mellitus   . Hypertension   . CVA (cerebral infarction)   . GERD (gastroesophageal reflux disease)   . HLD (hyperlipidemia)     No past surgical history on file.  Family History  Problem Relation Age of Onset  . Diabetes Mother   . Diabetes Father   . Diabetes Sister     History  Substance Use Topics  . Smoking status: Never Smoker   . Smokeless tobacco: Not on file  . Alcohol Use: No    OB History    Grav Para Term Preterm Abortions TAB SAB Ect Mult Living                  Review of Systems  Constitutional: Positive for fever, chills, diaphoresis, appetite  change and fatigue.  HENT: Positive for rhinorrhea. Negative for congestion and sore throat.   Eyes: Negative for pain and visual disturbance.  Respiratory: Negative for wheezing.        Pt denies cough and SOB, unlike noted in urgent care note.  Cardiovascular: Negative for chest pain and palpitations.  Gastrointestinal: Positive for nausea and vomiting. Negative for abdominal pain, diarrhea and blood in stool.  Genitourinary: Negative for dysuria, frequency and hematuria.  Musculoskeletal: Negative for back pain and gait problem.  Skin: Negative for rash and wound.  Neurological: Positive for weakness (generalized) and light-headedness. Negative for dizziness, syncope, numbness and headaches.  Psychiatric/Behavioral: Negative for confusion and agitation.  All other systems reviewed and are negative.    Allergies  Allegra  Home Medications   Current Outpatient Rx  Name Route Sig Dispense Refill  . ACETAMINOPHEN ER 650 MG PO TBCR Oral Take 650 mg by mouth every 8 (eight) hours as needed. For pain.    Marland Kitchen VITAMIN D3 2000 UNITS PO TABS Oral Take 1 tablet by mouth daily.    Marland Kitchen GLIPIZIDE 10 MG PO TABS Oral Take 10 mg by mouth 2 (two) times daily.     Marland Kitchen HYDROCHLOROTHIAZIDE 25 MG PO TABS Oral Take 25 mg by mouth daily.      . INSULIN DETEMIR 100 UNIT/ML [place] SOLN Subcutaneous Inject 7 Units into the skin every morning.     Marland Kitchen LOSARTAN POTASSIUM 50 MG PO TABS Oral Take 50 mg by mouth daily.      Marland Kitchen LOVASTATIN 20 MG PO TABS Oral Take  20 mg by mouth daily.      Marland Kitchen OMEPRAZOLE 40 MG PO CPDR Oral Take 40 mg by mouth daily.        BP 208/90  Pulse 99  Resp 16  SpO2 99%  Physical Exam  Nursing note and vitals reviewed. Constitutional: She is oriented to person, place, and time.       Mildly obese, awake but appears fatigued, GCS 15  HENT:  Head: Normocephalic and atraumatic.  Right Ear: External ear normal.  Left Ear: External ear normal.  Nose: Nose normal.       Mildly dry mucous membranes   Eyes: Conjunctivae are normal. Pupils are equal, round, and reactive to light. Right eye exhibits no discharge. Left eye exhibits no discharge. No scleral icterus.       Strabismus  Neck: Normal range of motion. Neck supple.  Cardiovascular: Regular rhythm and intact distal pulses.   Murmur (faint systolic murmur) heard.      Borderline tachycardic  Pulmonary/Chest: Effort normal and breath sounds normal. No respiratory distress. She has no wheezes. She has no rales. She exhibits no tenderness.  Abdominal: Soft. Bowel sounds are normal. She exhibits no distension. There is no tenderness. There is no guarding.       Obese  Musculoskeletal: Normal range of motion. She exhibits no edema.  Neurological: She is alert and oriented to person, place, and time.  Skin: Skin is warm and dry. No rash noted. She is not diaphoretic.  Psychiatric: She has a normal mood and affect. Judgment normal.    ED Course  Procedures (including critical care time)   Date: 09/22/2011  Rate: 99  Rhythm: normal sinus rhythm  QRS Axis: normal  Intervals: borderline QTc prolongation and first degree AV block, dif from prior on 02/16/09  ST/T Wave abnormalities: TW flattening in aVL; otherwise, no TW or ST abnl.    Labs Reviewed  CBC - Abnormal; Notable for the following:    RBC 5.18 (*)    All other components within normal limits  DIFFERENTIAL - Abnormal; Notable for the following:    Neutrophils Relative 91 (*)    Neutro Abs 8.3 (*)    Lymphocytes Relative 8 (*)    Monocytes Relative 1 (*)    All other components within normal limits  COMPREHENSIVE METABOLIC PANEL - Abnormal; Notable for the following:    Glucose, Bld 315 (*)    GFR calc non Af Amer 60 (*)    GFR calc Af Amer 69 (*)    All other components within normal limits  GLUCOSE, CAPILLARY - Abnormal; Notable for the following:    Glucose-Capillary 306 (*)    All other components within normal limits  POCT I-STAT 3, BLOOD GAS (G3+) - Abnormal;  Notable for the following:    pH, Arterial 7.410 (*)    pCO2 arterial 32.4 (*)    Acid-base deficit 3.0 (*)    All other components within normal limits  LACTIC ACID, PLASMA  POCT I-STAT TROPONIN I  BLOOD GAS, ARTERIAL   Dg Abd 1 View  09/22/2011  *RADIOLOGY REPORT*  Clinical Data: Fever, vomiting  ABDOMEN - 1 VIEW  Comparison: None.  Findings: Scattered air and stool throughout the bowel.  Negative for definite obstruction or ileus.  Lung bases clear. Atherosclerosis of the aorta and iliac bifurcation.  Mild degenerative changes of the spine.  No acute osseous finding.  No abnormal calcifications.  IMPRESSION: Nonobstructive bowel gas pattern.  Original Report Authenticated By: Judie Petit.  Ruel Favors, M.D.    Dg Chest Port 1 View  09/22/2011  *RADIOLOGY REPORT*  Clinical Data: Low grade fever and weakness.  PORTABLE CHEST - 1 VIEW  Comparison: Chest x-ray 05/27/2011.  Findings: Lung volumes are normal.  No consolidative airspace disease.  No pleural effusions.  No pneumothorax.  No pulmonary nodule or mass noted.  Pulmonary vasculature and the cardiomediastinal silhouette are within normal limits. Atherosclerotic calcifications within the aortic arch. Multiple calcified granulomas in the apices bilaterally.  IMPRESSION: 1.  No radiographic evidence of acute cardiopulmonary disease. 2.  Atherosclerosis.  Original Report Authenticated By: Florencia Reasons, M.D.     1. Nausea & vomiting   2. Dehydration       MDM  71 y.o. F with DM sent from Urgent Care 2/2 N/V, gen wkness, and poor PO intake.  Low grade fever to 100.3.  No CP, abd pain or urinary c/o.  Exam as above, appears fatigued and dehydrated; borderline tachy but HTN.  Initial glu 306 without Sn of DKA.  Labs otherwise with Nl WBC, lytes, creat (which is improved from prior), and Hgb.  EKG and trop without Sn of acute ischemia and pt without Sx's to suggest ACS.  Bolus IVF and zofran ordered.  Pt still nauseated and tachy after IVF; will  admit to [facility] for rehydration.  AAS ordered/pending.  Images without Sn of obstruction.  No acute issues prior to admission.      Particia Lather, MD 09/23/11 [phone]

## 2011-09-22 NOTE — ED Notes (Signed)
Onset yesterday N and V--several times--and temp of 100.3.She denies diarrhea.  No sick contacts.

## 2011-09-22 NOTE — ED Notes (Signed)
Patient returned from CT  Report called  Patient to floor

## 2011-09-22 NOTE — ED Notes (Signed)
[facility] called, ETA 15 minutes, per Dr Lorenz Coaster:  OK to wait for  them

## 2011-09-22 NOTE — ED Notes (Signed)
Pt arrived via EMS, ([facility]) from [facility] for UTI fever

## 2011-09-22 NOTE — Discharge Instructions (Signed)
We have determined that your problem requires further evaluation in the emergency department.  We will take care of your transport there.  Once at the emergency department, you will be evaluated by a provider and they will order whatever treatment or tests they deem necessary.  We cannot guarantee that they will do any specific test or do any specific treatment.  ° °

## 2011-09-22 NOTE — ED Notes (Signed)
Pt ambulated to and from restroom with 1 assist, pt given PO fluids

## 2011-09-22 NOTE — ED Notes (Signed)
Pt claimed that she feels a little better but felt nauseous with the fluid challenge

## 2011-09-22 NOTE — ED Notes (Signed)
Dr. Lorenz Coaster informed of this pt's VS, CBG, & condition. Pt attempting to drink a 2oz bottle of Pedialyte at this time.

## 2011-09-22 NOTE — ED Notes (Signed)
Waiting for internal med to come and write orders for admission.[name] [name]

## 2011-09-23 ENCOUNTER — Encounter (HOSPITAL_COMMUNITY): Payer: Self-pay | Admitting: General Practice

## 2011-09-23 LAB — LIPASE, BLOOD: Lipase: 29 U/L (ref 11–59)

## 2011-09-23 LAB — GLUCOSE, CAPILLARY
Glucose-Capillary: 251 mg/dL — ABNORMAL HIGH (ref 70–99)
Glucose-Capillary: 197 mg/dL — ABNORMAL HIGH (ref 70–99)
Glucose-Capillary: 118 mg/dL — ABNORMAL HIGH (ref 70–99)
Glucose-Capillary: 164 mg/dL — ABNORMAL HIGH (ref 70–99)

## 2011-09-23 LAB — COMPREHENSIVE METABOLIC PANEL
Albumin: 3.7 g/dL (ref 3.5–5.2)
BUN: 20 mg/dL (ref 6–23)
Creatinine, Ser: 1 mg/dL (ref 0.50–1.10)
Total Protein: 7.6 g/dL (ref 6.0–8.3)

## 2011-09-23 LAB — CBC
HCT: 41.3 % (ref 36.0–46.0)
Hemoglobin: 14 g/dL (ref 12.0–15.0)
MCV: 81 fL (ref 78.0–100.0)
RBC: 5.1 MIL/uL (ref 3.87–5.11)
WBC: 6.3 10*3/uL (ref 4.0–10.5)

## 2011-09-23 LAB — INFLUENZA PANEL BY PCR (TYPE A & B): Influenza A By PCR: NEGATIVE

## 2011-09-23 LAB — CARDIAC PANEL(CRET KIN+CKTOT+MB+TROPI)
Relative Index: INVALID (ref 0.0–2.5)
Troponin I: <0.3 ng/mL (ref <0.30)

## 2011-09-23 LAB — URINE CULTURE: Culture  Setup Time: 201303052313

## 2011-09-23 LAB — DIFFERENTIAL
Eosinophils Absolute: 0 10*3/uL (ref 0.0–0.7)
Eosinophils Relative: 0 % (ref 0–5)
Lymphs Abs: 0.9 10*3/uL (ref 0.7–4.0)
Monocytes Absolute: 0.4 10*3/uL (ref 0.1–1.0)
Monocytes Relative: 6 % (ref 3–12)

## 2011-09-23 LAB — HEMOGLOBIN A1C: Mean Plasma Glucose: 197 mg/dL — ABNORMAL HIGH (ref <117)

## 2011-09-23 LAB — SEDIMENTATION RATE: Sed Rate: 10 mm/hr (ref 0–22)

## 2011-09-23 MED ORDER — ACETAMINOPHEN 650 MG RE SUPP: 650.0000 mg | Freq: Four times a day (QID) | RECTAL | Status: DC | PRN

## 2011-09-23 MED ORDER — INSULIN ASPART 100 UNIT/ML ~~LOC~~ SOLN
0.0000 [IU] | Freq: Three times a day (TID) | SUBCUTANEOUS | Status: DC
  Administered 2011-09-24: 3 [IU] via SUBCUTANEOUS
  Administered 2011-09-24: 5 [IU] via SUBCUTANEOUS
  Administered 2011-09-25: 2 [IU] via SUBCUTANEOUS
  Administered 2011-09-25: 8 [IU] via SUBCUTANEOUS
  Administered 2011-09-25: 3 [IU] via SUBCUTANEOUS
  Administered 2011-09-26 (×2): 2 [IU] via SUBCUTANEOUS
  Administered 2011-09-26: 8 [IU] via SUBCUTANEOUS
  Administered 2011-09-27: 2 [IU] via SUBCUTANEOUS
  Administered 2011-09-27: 3 [IU] via SUBCUTANEOUS
  Administered 2011-09-27: 5 [IU] via SUBCUTANEOUS
  Administered 2011-09-28: 8 [IU] via SUBCUTANEOUS

## 2011-09-23 MED ORDER — SODIUM CHLORIDE 0.9 % IV SOLN
INTRAVENOUS | Status: DC
  Administered 2011-09-23: 01:00:00 via INTRAVENOUS

## 2011-09-23 MED ORDER — SODIUM CHLORIDE 0.9 % IV SOLN
INTRAVENOUS | Status: DC
  Administered 2011-09-23: 19:00:00 via INTRAVENOUS

## 2011-09-23 MED ORDER — INSULIN ASPART 100 UNIT/ML ~~LOC~~ SOLN
0.0000 [IU] | Freq: Three times a day (TID) | SUBCUTANEOUS | Status: DC
  Administered 2011-09-23: 9 [IU] via SUBCUTANEOUS
  Filled 2011-09-23: qty 3

## 2011-09-23 MED ORDER — SIMVASTATIN 20 MG PO TABS
20.0000 mg | ORAL_TABLET | Freq: Every day | ORAL | Status: DC
  Administered 2011-09-23 – 2011-09-27 (×5): 20 mg via ORAL
  Filled 2011-09-23 (×6): qty 1

## 2011-09-23 MED ORDER — INSULIN ASPART 100 UNIT/ML ~~LOC~~ SOLN
0.0000 [IU] | Freq: Every day | SUBCUTANEOUS | Status: DC
  Administered 2011-09-24: 2 [IU] via SUBCUTANEOUS
  Administered 2011-09-27: 3 [IU] via SUBCUTANEOUS

## 2011-09-23 MED ORDER — ONDANSETRON HCL 4 MG PO TABS: 4.0000 mg | ORAL_TABLET | Freq: Four times a day (QID) | ORAL | Status: DC | PRN

## 2011-09-23 MED ORDER — LABETALOL HCL 5 MG/ML IV SOLN
10.0000 mg | INTRAVENOUS | Status: DC | PRN
  Administered 2011-09-23: 10 mg via INTRAVENOUS
  Filled 2011-09-23: qty 4

## 2011-09-23 MED ORDER — SODIUM CHLORIDE 0.9 % IJ SOLN
3.0000 mL | Freq: Two times a day (BID) | INTRAMUSCULAR | Status: DC
  Administered 2011-09-23 – 2011-09-28 (×10): 3 mL via INTRAVENOUS

## 2011-09-23 MED ORDER — INSULIN REGULAR BOLUS VIA INFUSION
0.0000 [IU] | Freq: Three times a day (TID) | INTRAVENOUS | Status: DC
  Filled 2011-09-23: qty 10

## 2011-09-23 MED ORDER — INSULIN GLARGINE 100 UNIT/ML ~~LOC~~ SOLN: 15.0000 [IU] | Freq: Every day | SUBCUTANEOUS | Status: DC

## 2011-09-23 MED ORDER — SODIUM CHLORIDE 0.9 % IV SOLN
INTRAVENOUS | Status: DC
  Administered 2011-09-23: 15:00:00 via INTRAVENOUS
  Filled 2011-09-23: qty 1

## 2011-09-23 MED ORDER — GLIPIZIDE 10 MG PO TABS
10.0000 mg | ORAL_TABLET | Freq: Two times a day (BID) | ORAL | Status: DC
  Administered 2011-09-23: 10 mg via ORAL
  Filled 2011-09-23 (×3): qty 1

## 2011-09-23 MED ORDER — ONDANSETRON HCL 4 MG/2ML IJ SOLN
4.0000 mg | Freq: Four times a day (QID) | INTRAMUSCULAR | Status: DC | PRN
  Administered 2011-09-23 (×2): 4 mg via INTRAVENOUS
  Filled 2011-09-23 (×2): qty 2

## 2011-09-23 MED ORDER — INSULIN GLARGINE 100 UNIT/ML ~~LOC~~ SOLN
10.0000 [IU] | Freq: Every day | SUBCUTANEOUS | Status: DC
  Administered 2011-09-23: 10 [IU] via SUBCUTANEOUS
  Filled 2011-09-23: qty 3

## 2011-09-23 MED ORDER — DEXTROSE-NACL 5-0.45 % IV SOLN
INTRAVENOUS | Status: DC
  Administered 2011-09-23: 18:00:00 via INTRAVENOUS

## 2011-09-23 MED ORDER — METOCLOPRAMIDE HCL 5 MG/ML IJ SOLN
5.0000 mg | Freq: Three times a day (TID) | INTRAMUSCULAR | Status: DC
  Administered 2011-09-23 – 2011-09-24 (×2): 5 mg via INTRAVENOUS
  Filled 2011-09-23 (×6): qty 1

## 2011-09-23 MED ORDER — ACETAMINOPHEN 325 MG PO TABS
650.0000 mg | ORAL_TABLET | Freq: Four times a day (QID) | ORAL | Status: DC | PRN
  Filled 2011-09-23: qty 2

## 2011-09-23 MED ORDER — INSULIN GLARGINE 100 UNIT/ML ~~LOC~~ SOLN
15.0000 [IU] | Freq: Every day | SUBCUTANEOUS | Status: DC
  Administered 2011-09-23 – 2011-09-27 (×5): 15 [IU] via SUBCUTANEOUS

## 2011-09-23 MED ORDER — INSULIN GLARGINE 100 UNIT/ML ~~LOC~~ SOLN: 10.0000 [IU] | Freq: Every day | SUBCUTANEOUS | Status: DC

## 2011-09-23 MED ORDER — LOSARTAN POTASSIUM 50 MG PO TABS
50.0000 mg | ORAL_TABLET | Freq: Every day | ORAL | Status: DC
  Administered 2011-09-23 – 2011-09-28 (×6): 50 mg via ORAL
  Filled 2011-09-23 (×6): qty 1

## 2011-09-23 MED ORDER — DEXTROSE 50 % IV SOLN: 25.0000 mL | INTRAVENOUS | Status: DC | PRN

## 2011-09-23 MED ORDER — PANTOPRAZOLE SODIUM 40 MG PO TBEC
40.0000 mg | DELAYED_RELEASE_TABLET | Freq: Every day | ORAL | Status: DC
  Administered 2011-09-24 – 2011-09-28 (×5): 40 mg via ORAL
  Filled 2011-09-23 (×4): qty 1

## 2011-09-23 NOTE — Progress Notes (Signed)
Pt. Admits that she feels some better. Family present at bedside. PCR neg for influenza.

## 2011-09-23 NOTE — ED Provider Notes (Addendum)
I saw and evaluated the patient, reviewed the resident's note and I agree with the findings and plan.    Lyanne Co, MD 09/23/11 4098  Lyanne Co, MD 09/23/11 [phone]

## 2011-09-23 NOTE — Progress Notes (Signed)
Pt on glucomander #3 cbg  118. Dr. Joellen Jersey for triad hospitalist page.

## 2011-09-23 NOTE — Progress Notes (Signed)
Utilization review completed.  

## 2011-09-23 NOTE — Progress Notes (Signed)
Orders from Dr. Benjamine Mola to d/c iv D51/2 ns  And start ns @75mlhr . Order also to give Lantus 15 units subc.

## 2011-09-23 NOTE — Progress Notes (Signed)
Subjective: Patient still not feeling well, with nausea and vomiting.  Not able to keep anything down   Objective: Vital signs in last 24 hours: Filed Vitals:   09/22/11 2300 09/22/11 2354 09/23/11 0313 09/23/11 0639  BP: 199/75 189/83 156/71 126/69  Pulse: 113 111 101 86  Temp:  99.3 F (37.4 C) 100.3 F (37.9 C) 98.9 F (37.2 C)  TempSrc:  Oral Oral Oral  Resp:  20 18 18   Height:  4\' 10"  (1.473 m)    Weight:  57.471 kg (126 lb 11.2 oz)  57.561 kg (126 lb 14.4 oz)  SpO2: 100% 97% 97% 95%   Weight change:   Intake/Output Summary (Last 24 hours) at 09/23/11 0903 Last data filed at 09/23/11 0850  Gross per 24 hour  Intake 1546.67 ml  Output    450 ml  Net 1096.67 ml    Physical Exam: General: Awake, Oriented, appears sick HEENT: EOMI. Neck: Supple, no JVD CV: S1 and S2, RRR Lungs: Clear to ascultation bilaterally, no wheezing Abdomen: Soft, Nontender, Nondistended, +bowel sounds. Ext: Good pulses. Trace edema.   Lab Results:  [facility] 09/23/11 0131 09/22/11 1631  NA 134* 137  K 3.7 3.6  CL 96 98  CO2 20 22  GLUCOSE 359* 315*  BUN 20 19  CREATININE 1.00 0.94  CALCIUM 9.4 9.6  MG -- --  PHOS -- --    Basename 09/23/11 0131 09/22/11 1631  AST 16 20  ALT 15 16  ALKPHOS 92 105  BILITOT 0.4 0.5  PROT 7.6 8.3  ALBUMIN 3.7 4.0    Basename 09/23/11 0131  LIPASE 29  AMYLASE --    Basename 09/23/11 0131 09/22/11 1631  WBC 6.3 9.1  NEUTROABS 5.0 8.3*  HGB 14.0 14.8  HCT 41.3 41.7  MCV 81.0 80.5  PLT 179 178    Basename 09/23/11 0131  CKTOTAL 70  CKMB 1.9  CKMBINDEX --  TROPONINI <0.30   No components found with this basename: POCBNP:3 No results found for this basename: DDIMER:2 in the last 72 hours No results found for this basename: HGBA1C:2 in the last 72 hours No results found for this basename: CHOL:2,HDL:2,LDLCALC:2,TRIG:2,CHOLHDL:2,LDLDIRECT:2 in the last 72 hours No results found for this basename: TSH,T4TOTAL,FREET3,T3FREE,THYROIDAB  in the last 72 hours No results found for this basename: VITAMINB12:2,FOLATE:2,FERRITIN:2,TIBC:2,IRON:2,RETICCTPCT:2 in the last 72 hours  Micro Results: No results found for this or any previous visit (from the past 240 hour(s)).  Studies/Results: Dg Abd 1 View  09/22/2011  *RADIOLOGY REPORT*  Clinical Data: Fever, vomiting  ABDOMEN - 1 VIEW  Comparison: None.  Findings: Scattered air and stool throughout the bowel.  Negative for definite obstruction or ileus.  Lung bases clear. Atherosclerosis of the aorta and iliac bifurcation.  Mild degenerative changes of the spine.  No acute osseous finding.  No abnormal calcifications.  IMPRESSION: Nonobstructive bowel gas pattern.  Original Report Authenticated By: Judie Petit. Ruel Favors, M.D.   Ct Head Wo Contrast  09/22/2011  *RADIOLOGY REPORT*  Clinical Data: Headache.  Weakness.  Previous stroke.  CT HEAD WITHOUT CONTRAST  Technique:  Contiguous axial images were obtained from the base of the skull through the vertex without contrast.  Comparison: None.  Findings: There is no acute intracranial hemorrhage, infarction, or mass lesion.  There are multi focal old infarcts in the right middle cerebral artery distribution with secondary encephalomalacia. There are also small scattered areas of periventricular white matter lucency in the left cerebral hemisphere consistent with small vessel ischemic disease.  Mild  diffuse cerebral cortical atrophy.  Moderate cerebellar atrophy.  No osseous abnormality.  IMPRESSION:  1.  No acute intracranial abnormality. 2.  Old right middle cerebral artery infarcts with chronic small vessel ischemic changes in the left cerebral hemisphere.  Original Report Authenticated By: Gwynn Burly, M.D.   Dg Chest Port 1 View  09/22/2011  *RADIOLOGY REPORT*  Clinical Data: Low grade fever and weakness.  PORTABLE CHEST - 1 VIEW  Comparison: Chest x-ray 05/27/2011.  Findings: Lung volumes are normal.  No consolidative airspace disease.  No pleural  effusions.  No pneumothorax.  No pulmonary nodule or mass noted.  Pulmonary vasculature and the cardiomediastinal silhouette are within normal limits. Atherosclerotic calcifications within the aortic arch. Multiple calcified granulomas in the apices bilaterally.  IMPRESSION: 1.  No radiographic evidence of acute cardiopulmonary disease. 2.  Atherosclerosis.  Original Report Authenticated By: Florencia Reasons, M.D.    Medications: I have reviewed the patient's current medications. Scheduled Meds:   . gi cocktail  30 mL Oral Once  . glipiZIDE  10 mg Oral BID AC  . insulin aspart  0-9 Units Subcutaneous TID WC  . insulin glargine  10 Units Subcutaneous QHS  . losartan  50 mg Oral Daily  . ondansetron (ZOFRAN) IV  4 mg Intravenous Once  . pantoprazole  40 mg Oral Q1200  . simvastatin  20 mg Oral q1800  . sodium chloride  1,000 mL Intravenous Once  . sodium chloride  3 mL Intravenous Q12H  . DISCONTD: insulin glargine  10 Units Subcutaneous QHS   Continuous Infusions:   . sodium chloride 100 mL/hr at 09/23/11 0108   PRN Meds:.acetaminophen, acetaminophen, labetalol, ondansetron (ZOFRAN) IV, ondansetron  Assessment/Plan: 1. Nausea and vomiting - still having,  this most likely could be viral. Her LFTs are normal. And she does not complain of any abdominal pain or diarrhea and abdomen on exam is benign. IVF  #2. Uncontrolled diabetes mellitus 2 with pro DKA/HONK - patient does have anion gap and ketones in the urine. I think patient may  be in DKA with a gap of 18, will start insulin gtt and DKA protocol  #3. Headache - could be part of a viral process. Check influenza PCR  #4. Subjective feeling of fever and chills - r/o influenza .  #5. Uncontrolled hypertension - continue her losartan for now but hold off HCTZ. Patient will be on when necessary IV labetalol for systolic blood pressure more than 160. Her blood pressure may be uncontrolled due to the nausea and vomiting.   #6. History  of hyperlipidemia and previous stroke.   Will need PT consult as well  CODE STATUS - full code.    LOS: 1 day  Toccara Alford, DO 09/23/2011, 9:03 AM

## 2011-09-24 LAB — CBC
HCT: 41.1 % (ref 36.0–46.0)
MCV: 79.8 fL (ref 78.0–100.0)
RBC: 5.15 MIL/uL — ABNORMAL HIGH (ref 3.87–5.11)
WBC: 9.8 10*3/uL (ref 4.0–10.5)

## 2011-09-24 LAB — MAGNESIUM: Magnesium: 1.7 mg/dL (ref 1.5–2.5)

## 2011-09-24 LAB — GLUCOSE, CAPILLARY: Glucose-Capillary: 238 mg/dL — ABNORMAL HIGH (ref 70–99)

## 2011-09-24 LAB — BASIC METABOLIC PANEL
BUN: 13 mg/dL (ref 6–23)
CO2: 26 mEq/L (ref 19–32)
Chloride: 102 mEq/L (ref 96–112)
Creatinine, Ser: 0.95 mg/dL (ref 0.50–1.10)
Potassium: 2.8 mEq/L — ABNORMAL LOW (ref 3.5–5.1)

## 2011-09-24 MED ORDER — POTASSIUM CHLORIDE 10 MEQ/100ML IV SOLN
10.0000 meq | INTRAVENOUS | Status: AC
  Administered 2011-09-24 (×3): 10 meq via INTRAVENOUS
  Filled 2011-09-24 (×3): qty 100

## 2011-09-24 MED ORDER — METOCLOPRAMIDE HCL 5 MG PO TABS
5.0000 mg | ORAL_TABLET | Freq: Three times a day (TID) | ORAL | Status: DC
  Administered 2011-09-24 – 2011-09-28 (×17): 5 mg via ORAL
  Filled 2011-09-24 (×21): qty 1

## 2011-09-24 MED ORDER — HYDROCHLOROTHIAZIDE 25 MG PO TABS
25.0000 mg | ORAL_TABLET | Freq: Every day | ORAL | Status: DC
  Administered 2011-09-24 – 2011-09-25 (×2): 25 mg via ORAL
  Filled 2011-09-24 (×3): qty 1

## 2011-09-24 MED ORDER — POTASSIUM CHLORIDE CRYS ER 20 MEQ PO TBCR
40.0000 meq | EXTENDED_RELEASE_TABLET | Freq: Two times a day (BID) | ORAL | Status: DC
  Administered 2011-09-24 – 2011-09-28 (×9): 40 meq via ORAL
  Filled 2011-09-24 (×11): qty 2

## 2011-09-24 MED ORDER — INSULIN ASPART 100 UNIT/ML ~~LOC~~ SOLN
3.0000 [IU] | Freq: Three times a day (TID) | SUBCUTANEOUS | Status: DC
  Administered 2011-09-25 – 2011-09-28 (×7): 3 [IU] via SUBCUTANEOUS

## 2011-09-24 MED ORDER — LIVING WELL WITH DIABETES BOOK
Freq: Once | Status: AC
  Administered 2011-09-24: 1
  Filled 2011-09-24: qty 1

## 2011-09-24 NOTE — Progress Notes (Addendum)
Inpatient Diabetes Program Recommendations  AACE/ADA: New Consensus Statement on Inpatient Glycemic Control (2009)  Target Ranges:  Prepandial:   less than 140 mg/dL      Peak postprandial:   less than 180 mg/dL (1-2 hours)      Critically ill patients:  140 - 180 mg/dL    Inpatient Diabetes Program Recommendations Insulin - Meal Coverage: Add 3 units with meals for post prandial elevations  Thank you  Piedad Climes RN,BSN,CDE Inpatient Diabetes Coordinator   Add: inpatient education orders placed for patient to view DM videos on patient education network and 'Living Well with Diabetes' patient education workbook.

## 2011-09-24 NOTE — Progress Notes (Signed)
Utilization review complete 

## 2011-09-24 NOTE — Progress Notes (Signed)
Text Page to Verner Mould MD notified of K 2.8 orders received Marisa Cyphers RN

## 2011-09-24 NOTE — Progress Notes (Signed)
Subjective: Patient feeling better, able to eat clears- no nausea, still having lots of saliva    Objective: Vital signs in last 24 hours: Filed Vitals:   09/23/11 0639 09/23/11 1500 09/23/11 2039 09/24/11 0401  BP: 126/69 110/70 184/85 173/82  Pulse: 86 88 93 89  Temp: 98.9 F (37.2 C) 98.9 F (37.2 C) 99.1 F (37.3 C) 99.3 F (37.4 C)  TempSrc: Oral  Oral Oral  Resp: 18 18 18 18   Height:      Weight: 57.561 kg (126 lb 14.4 oz)   58.015 kg (127 lb 14.4 oz)  SpO2: 95% 95% 99% 98%   Weight change: 0.544 kg (1 lb 3.2 oz)  Intake/Output Summary (Last 24 hours) at 09/24/11 0850 Last data filed at 09/24/11 0646  Gross per 24 hour  Intake 1226.2 ml  Output   1750 ml  Net -523.8 ml    Physical Exam: General: Awake, Oriented, no acute distress today HEENT: EOMI. Neck: Supple, no JVD CV: S1 and S2, RRR Lungs: Clear to ascultation bilaterally, no wheezing Abdomen: Soft, Nontender, Nondistended, +bowel sounds. Ext: Good pulses. Trace edema.   Lab Results:  [facility] 09/24/11 0612 09/23/11 0131  NA 138 134*  K 2.8* 3.7  CL 102 96  CO2 26 20  GLUCOSE 102* 359*  BUN 13 20  CREATININE 0.95 1.00  CALCIUM 8.5 9.4  MG -- --  PHOS -- --    Basename 09/23/11 0131 09/22/11 1631  AST 16 20  ALT 15 16  ALKPHOS 92 105  BILITOT 0.4 0.5  PROT 7.6 8.3  ALBUMIN 3.7 4.0    Basename 09/23/11 0131  LIPASE 29  AMYLASE --    Basename 09/24/11 0612 09/23/11 0131 09/22/11 1631  WBC 9.8 6.3 --  NEUTROABS -- 5.0 8.3*  HGB 14.2 14.0 --  HCT 41.1 41.3 --  MCV 79.8 81.0 --  PLT 187 179 --    Basename 09/23/11 0131  CKTOTAL 70  CKMB 1.9  CKMBINDEX --  TROPONINI <0.30   No components found with this basename: POCBNP:3 No results found for this basename: DDIMER:2 in the last 72 hours  Basename 09/23/11 0131  HGBA1C 8.5*   No results found for this basename: CHOL:2,HDL:2,LDLCALC:2,TRIG:2,CHOLHDL:2,LDLDIRECT:2 in the last 72 hours No results found for this basename:  TSH,T4TOTAL,FREET3,T3FREE,THYROIDAB in the last 72 hours No results found for this basename: VITAMINB12:2,FOLATE:2,FERRITIN:2,TIBC:2,IRON:2,RETICCTPCT:2 in the last 72 hours  Micro Results: Recent Results (from the past 240 hour(s))  URINE CULTURE     Status: Normal   Collection Time   09/22/11  2:12 PM      Component Value Range Status Comment   Specimen Description URINE, CLEAN CATCH   Final    Special Requests NONE   Final    Culture  Setup Time [id number]   Final    Colony Count 50,000 COLONIES/ML   Final    Culture     Final    Value: Multiple bacterial morphotypes present, none predominant. Suggest appropriate recollection if clinically indicated.   Report Status 09/23/2011 FINAL   Final   CULTURE, BLOOD (ROUTINE X 2)     Status: Normal (Preliminary result)   Collection Time   09/23/11  1:30 AM      Component Value Range Status Comment   Specimen Description BLOOD RIGHT ARM   Final    Special Requests BOTTLES DRAWN AEROBIC AND ANAEROBIC [facility]   Final    Culture  Setup Time [id number]   Final    Culture  Final    Value:        BLOOD CULTURE RECEIVED NO GROWTH TO DATE CULTURE WILL BE HELD FOR 5 DAYS BEFORE ISSUING A FINAL NEGATIVE REPORT   Report Status PENDING   Incomplete   CULTURE, BLOOD (ROUTINE X 2)     Status: Normal (Preliminary result)   Collection Time   09/23/11  1:40 AM      Component Value Range Status Comment   Specimen Description BLOOD LEFT HAND   Final    Special Requests BOTTLES DRAWN AEROBIC AND ANAEROBIC [facility] EACH   Final    Culture  Setup Time [id number]   Final    Culture     Final    Value:        BLOOD CULTURE RECEIVED NO GROWTH TO DATE CULTURE WILL BE HELD FOR 5 DAYS BEFORE ISSUING A FINAL NEGATIVE REPORT   Report Status PENDING   Incomplete     Studies/Results: Dg Abd 1 View  09/22/2011  *RADIOLOGY REPORT*  Clinical Data: Fever, vomiting  ABDOMEN - 1 VIEW  Comparison: None.  Findings: Scattered air and stool throughout the bowel.  Negative for  definite obstruction or ileus.  Lung bases clear. Atherosclerosis of the aorta and iliac bifurcation.  Mild degenerative changes of the spine.  No acute osseous finding.  No abnormal calcifications.  IMPRESSION: Nonobstructive bowel gas pattern.  Original Report Authenticated By: Judie Petit. Ruel Favors, M.D.   Ct Head Wo Contrast  09/22/2011  *RADIOLOGY REPORT*  Clinical Data: Headache.  Weakness.  Previous stroke.  CT HEAD WITHOUT CONTRAST  Technique:  Contiguous axial images were obtained from the base of the skull through the vertex without contrast.  Comparison: None.  Findings: There is no acute intracranial hemorrhage, infarction, or mass lesion.  There are multi focal old infarcts in the right middle cerebral artery distribution with secondary encephalomalacia. There are also small scattered areas of periventricular white matter lucency in the left cerebral hemisphere consistent with small vessel ischemic disease.  Mild diffuse cerebral cortical atrophy.  Moderate cerebellar atrophy.  No osseous abnormality.  IMPRESSION:  1.  No acute intracranial abnormality. 2.  Old right middle cerebral artery infarcts with chronic small vessel ischemic changes in the left cerebral hemisphere.  Original Report Authenticated By: Gwynn Burly, M.D.   Dg Chest Port 1 View  09/22/2011  *RADIOLOGY REPORT*  Clinical Data: Low grade fever and weakness.  PORTABLE CHEST - 1 VIEW  Comparison: Chest x-ray 05/27/2011.  Findings: Lung volumes are normal.  No consolidative airspace disease.  No pleural effusions.  No pneumothorax.  No pulmonary nodule or mass noted.  Pulmonary vasculature and the cardiomediastinal silhouette are within normal limits. Atherosclerotic calcifications within the aortic arch. Multiple calcified granulomas in the apices bilaterally.  IMPRESSION: 1.  No radiographic evidence of acute cardiopulmonary disease. 2.  Atherosclerosis.  Original Report Authenticated By: Florencia Reasons, M.D.    Medications: I  have reviewed the patient's current medications. Scheduled Meds:    . insulin aspart  0-15 Units Subcutaneous TID WC  . insulin aspart  0-5 Units Subcutaneous QHS  . insulin glargine  15 Units Subcutaneous QHS  . losartan  50 mg Oral Daily  . metoCLOPramide  5 mg Oral TID AC & HS  . pantoprazole  40 mg Oral Q1200  . potassium chloride  10 mEq Intravenous Q1 Hr x 3  . potassium chloride  40 mEq Oral BID  . simvastatin  20 mg Oral q1800  . sodium  chloride  3 mL Intravenous Q12H  . DISCONTD: glipiZIDE  10 mg Oral BID AC  . DISCONTD: insulin aspart  0-9 Units Subcutaneous TID WC  . DISCONTD: insulin glargine  10 Units Subcutaneous QHS  . DISCONTD: insulin glargine  15 Units Subcutaneous QHS  . DISCONTD: insulin regular  0-10 Units Intravenous TID WC  . DISCONTD: metoCLOPramide (REGLAN) injection  5 mg Intravenous TID AC & HS   Continuous Infusions:    . sodium chloride 75 mL/hr at 09/23/11 1859  . DISCONTD: sodium chloride 100 mL/hr at 09/23/11 0108  . DISCONTD: dextrose 5 % and 0.45% NaCl 75 mL/hr at 09/23/11 1745  . DISCONTD: insulin (NOVOLIN-R) infusion 2.1 mL/hr at 09/23/11 1504   PRN Meds:.acetaminophen, acetaminophen, dextrose, labetalol, ondansetron (ZOFRAN) IV, ondansetron  Assessment/Plan: 1. Nausea and vomiting - this most likely could be viral. Her LFTs are normal. And she does not complain of any abdominal pain or diarrhea and abdomen on exam is benign. IVF  #2. Uncontrolled diabetes mellitus 2 with pro DKA/HONK - patient does have anion gap and ketones in the urine.  Probable DKA with a gap of 18 since resolved on insulin gtt- restarted lantus, SSI- Hga1c was 12 now 8.5 so patient is better controlled since 11/12  #3. Headache - could be part of a viral process. Check influenza PCR (negative)- resolved  #4. Subjective feeling of fever and chills - resolevd- ? Viral illness .  #5. Uncontrolled hypertension - continue her losartan and HCTZ. Patient will be on when  necessary IV labetalol for systolic blood pressure more than 160. Her blood pressure may be uncontrolled due to the nausea and vomiting.   #6. History of hyperlipidemia and previous stroke.   #7 hypokalemia- repleat and recheck, check Mg  Will need PT consult as well  CODE STATUS - full code.    LOS: 2 days  Virgie Chery, DO 09/24/2011, 8:50 AM

## 2011-09-24 NOTE — Progress Notes (Signed)
MD asked nursing staff to observe pt drawing up and giving her insulin to observe technique. Pt states she uses the PEN at home. Drew up 2 as opposed to 3 units so that was corrected. Pt did wipe off area properly on her leg with alcohol. Pt did not press very hard to ensure that needle was completely engaged under the skin and had to help pt push Insulin pen more firm to skin . Pt did expel all insulin needed and did count to ten. Needs reinforcement. Recommend that nursing continue to let pt give herself the insulin injections for further observation. Marisa Cyphers RN

## 2011-09-25 DIAGNOSIS — N189 Chronic kidney disease, unspecified: Secondary | ICD-10-CM | POA: Diagnosis present

## 2011-09-25 LAB — BASIC METABOLIC PANEL
CO2: 25 mEq/L (ref 19–32)
Calcium: 9.6 mg/dL (ref 8.4–10.5)
Creatinine, Ser: 1.31 mg/dL — ABNORMAL HIGH (ref 0.50–1.10)
Glucose, Bld: 134 mg/dL — ABNORMAL HIGH (ref 70–99)

## 2011-09-25 LAB — GLUCOSE, CAPILLARY
Glucose-Capillary: 199 mg/dL — ABNORMAL HIGH (ref 70–99)
Glucose-Capillary: 288 mg/dL — ABNORMAL HIGH (ref 70–99)

## 2011-09-25 MED ORDER — HYDRALAZINE HCL 25 MG PO TABS
25.0000 mg | ORAL_TABLET | Freq: Three times a day (TID) | ORAL | Status: DC
  Administered 2011-09-25 – 2011-09-27 (×6): 25 mg via ORAL
  Filled 2011-09-25 (×9): qty 1

## 2011-09-25 MED ORDER — INSULIN PEN STARTER KIT
1.0000 | Freq: Once | Status: AC
  Administered 2011-09-25: 1
  Filled 2011-09-25: qty 1

## 2011-09-25 NOTE — Progress Notes (Signed)
Gave Pt Diabetes kit and had pt draw up and administer Insulin with pt needles. Pt did much better than day before . Was able to draw up correct amount and administer all of Insulin dose using proper technique. Daughter here earlier spoke to her about getting her mother ( pt) a current eye exam & getting new glasses so she can see better to administer correct dosage of Insulin. Daughter stated pt lost her latest pair of glasses that were probably 72 year old & pt has been using a pair of glasses approx. 71 years old. Marisa Cyphers RN

## 2011-09-25 NOTE — Progress Notes (Signed)
   CARE MANAGEMENT NOTE 09/25/2011  Patient:  [name] [name],[name] [name]   Account Number:  [id number]  Date Initiated:  09/24/2011  Documentation initiated by:  Letha Cape  Subjective/Objective Assessment:   dx n/v  admit as observation- from home with family.     Action/Plan:   PT eval   Anticipated DC Date:  09/24/2011   Anticipated DC Plan:  HOME/SELF CARE  In-house referral  Clinical Social Worker      DC Planning Services  CM consult      Choice offered to / List presented to:             Status of service:  In process, will continue to follow Medicare Important Message given?   (If response is "NO", the following Medicare IM given date fields will be blank) Date Medicare IM given:   Date Additional Medicare IM given:    Discharge Disposition:    Per UR Regulation:    Comments:  09/25/11- 1415- Donn Pierini RN, BSN [phone] per PT notes recommending SNF for rehab, CSW consulted for possible SNF placement. CM to continue to follow.  09/24/11 9:30 Letha Cape RN, BSN [phone] patient is from home with family.  NCM will continue to follow for dc needs.

## 2011-09-25 NOTE — Progress Notes (Signed)
CSW spoke with pt and pt's dtr [name] [name].  Plan for SNF at d/c, dtr [place] int [place].  Provided list and began SNF process.  Full yellow assessment in chart.

## 2011-09-25 NOTE — Evaluation (Signed)
Physical Therapy Evaluation Patient Details Name: [name] [name] MRN: [id number] DOB: 05-08-1941 Today's Date: 09/25/2011  Problem List:  Patient Active Problem List  Diagnoses  . Nausea and vomiting  . HTN (hypertension)  . H/O: CVA (cardiovascular accident)  . Diabetes mellitus, type 2  . CKD (chronic kidney disease)    Past Medical History:  Past Medical History  Diagnosis Date  . Diabetes mellitus   . Hypertension   . CVA (cerebral infarction)   . GERD (gastroesophageal reflux disease)   . HLD (hyperlipidemia)   . Stroke   . Heart murmur    Past Surgical History:  Past Surgical History  Procedure Date  . Appendectomy   . Tonsillectomy     PT Assessment/Plan/Recommendation PT Assessment Clinical Impression Statement: Patient admitted with N/V and ? virus. Having difficulty learning to give herself insulin.  has visual problems due to needing new glasses.  Patient with poor safety awareness overall with poor judgement as well.  Patient will benefit from NHP to address safety issues prior to d/c home alone.   PT Recommendation/Assessment: Patient will need skilled PT in the acute care venue PT Problem List: Decreased activity tolerance;Decreased balance;Decreased mobility;Decreased safety awareness Barriers to Discharge: Decreased caregiver support PT Therapy Diagnosis : Difficulty walking;Generalized weakness PT Plan PT Frequency: Min 3X/week PT Treatment/Interventions: DME instruction;Gait training;Functional mobility training;Therapeutic activities;Therapeutic exercise;Balance training;Patient/family education PT Recommendation Follow Up Recommendations: Skilled nursing facility;Supervision/Assistance - 24 hour Equipment Recommended: Defer to next venue PT Goals  Acute Rehab PT Goals PT Goal Formulation: With patient Time For Goal Achievement: 2 weeks Pt will go Supine/Side to Sit: Independently PT Goal: Supine/Side to Sit - Progress: Goal set today Pt will go  Sit to Stand: Independently PT Goal: Sit to Stand - Progress: Goal set today Pt will go Stand to Sit: Independently PT Goal: Stand to Sit - Progress: Goal set today Pt will Ambulate: >150 feet;with modified independence;with least restrictive assistive device PT Goal: Ambulate - Progress: Goal set today Pt will Perform Home Exercise Program: Independently PT Goal: Perform Home Exercise Program - Progress: Goal set today  PT Evaluation Precautions/Restrictions  Precautions Precautions: Fall Required Braces or Orthoses: No Restrictions Weight Bearing Restrictions: No Prior Functioning  Home Living Lives With: Alone Type of Home: Apartment Home Layout: Multi-level;Full bath on main level;Able to live on main level with bedroom/bathroom (lives on second floor) Home Access: Elevator Bathroom Shower/Tub: Heritage manager Toilet: Standard Home Adaptive Equipment: Engineer, drilling - four wheeled Prior Function Level of Independence: Independent with basic ADLs;Independent with homemaking with ambulation;Independent with gait;Independent with transfers Driving: No Vocation: Retired Producer, television/film/video: Awake/alert Overall Cognitive Status: Appears within functional limits for tasks assessed Orientation Level: Oriented X4 Sensation/Coordination Sensation Light Touch: Appears Intact Stereognosis: Not tested Hot/Cold: Not tested Proprioception: Not tested Coordination Gross Motor Movements are Fluid and Coordinated: Yes Fine Motor Movements are Fluid and Coordinated: Yes Extremity Assessment RUE Assessment RUE Assessment: Within Functional Limits LUE Assessment LUE Assessment: Within Functional Limits RLE Assessment RLE Assessment: Within Functional Limits LLE Assessment LLE Assessment: Within Functional Limits Mobility (including Balance) Bed Mobility Bed Mobility: Yes Rolling Left: 5: Supervision Left Sidelying to Sit: 5:  Supervision Transfers Transfers: Yes Sit to Stand: 4: Min assist;With upper extremity assist;From bed (guard assist) Sit to Stand Details (indicate cue type and reason): Cues needed for safety - patient lacks safety awareness when ambulating and performing tasks Stand to Sit: 4: Min assist;With upper extremity assist;With armrests;To chair/3-in-1 Stand to Sit Details:  Needed min assist to control descent and to reach back with patient demonstrating poor safety awareness Ambulation/Gait Ambulation/Gait: Yes Ambulation/Gait Assistance: 4: Min assist Ambulation/Gait Assistance Details (indicate cue type and reason): Patient uses 4 wheeled walker and needs cues for safety with RW as she pushes it too far at times.  Poor safety with turns as well.  Patient reported that she normally does not use anything in her apartment as it is "cluttered" and the RW will not fit.  Patient very unsteady without the RW.   Ambulation Distance (Feet): 250 Feet Assistive device: 4-wheeled walker Gait Pattern: Step-through pattern;Decreased stride length;Trunk flexed Gait velocity: .75 ft/sec suggesting patient at risk of falls.   Stairs: No Corporate treasurer: No  Posture/Postural Control Posture/Postural Control: Postural limitations Postural Limitations: Flexed posture overall Balance Balance Assessed: Yes Static Standing Balance Static Standing - Balance Support: Bilateral upper extremity supported;During functional activity Static Standing - Level of Assistance: 5: Stand by assistance Static Standing - Comment/# of Minutes: Static stance with device is functional however without the device patient needs min assist   End of Session PT - End of Session Equipment Utilized During Treatment: Gait belt Activity Tolerance: Patient tolerated treatment well Patient left: in chair;with call bell in reach Nurse Communication: Mobility status for ambulation;Mobility status for  transfers General Behavior During Session: [name] for tasks performed Cognition: Impaired Cognitive Impairment: poor safety awareness and poor judgment.  Does not use good judgment and is unaware of her deficits especially with vision.  [name],[name] Bardsley 09/25/2011, 1:45 PM  [facility] Acute Rehabilitation [phone] (pager)

## 2011-09-25 NOTE — Progress Notes (Signed)
Insulin administration  Pt was instructed to perform self injection at administration time.  She turned the dial on the lantus to 13 units instead of the ordered 15 units.  She cleaned the site appropriately but she did not press the syringe into her skin enough to actually inject all of the medication.  This nurse was present at bedside and reinforced the correct dose of insulin and the correct placement of the insulin syringe for injection.  When administering the medication, she had a hard time pressing the dial completely down.  Again this was reinforced by this nurse.  Proper demonstration was then performed by this nurse as a reinforcement of education.  Patient continues to need further assistance with managing her insulin administration. Royden Purl RN

## 2011-09-25 NOTE — Progress Notes (Signed)
Subjective: Feeling much better, tolerating food Having trouble giving herself insulin Agreeable for SNF    Objective: Vital signs in last 24 hours: Filed Vitals:   09/24/11 0401 09/24/11 1346 09/24/11 2323 09/25/11 0523  BP: 173/82 175/87 159/96 157/60  Pulse: 89 100 100 98  Temp: 99.3 F (37.4 C) 98.5 F (36.9 C) 98.5 F (36.9 C) 99.3 F (37.4 C)  TempSrc: Oral  Oral Oral  Resp: 18 20 20 20   Height:      Weight: 58.015 kg (127 lb 14.4 oz)   57.153 kg (126 lb)  SpO2: 98% 99% 100% 100%   Weight change: -0.862 kg (-1 lb 14.4 oz)  Intake/Output Summary (Last 24 hours) at 09/25/11 1257 Last data filed at 09/25/11 1243  Gross per 24 hour  Intake    903 ml  Output   1305 ml  Net   -402 ml    Physical Exam: General: Awake, Oriented, no acute distress today HEENT: EOMI. Neck: Supple, no JVD CV: S1 and S2, RRR Lungs: Clear to ascultation bilaterally, no wheezing Abdomen: Soft, Nontender, Nondistended, +bowel sounds. Ext: Good pulses. Trace edema.   Lab Results:  Basename 09/25/11 0620 09/24/11 0905 09/24/11 0612  NA 139 -- 138  K 4.0 -- 2.8*  CL 104 -- 102  CO2 25 -- 26  GLUCOSE 134* -- 102*  BUN 16 -- 13  CREATININE 1.31* -- 0.95  CALCIUM 9.6 -- 8.5  MG -- 1.7 --  PHOS -- -- --    Basename 09/23/11 0131 09/22/11 1631  AST 16 20  ALT 15 16  ALKPHOS 92 105  BILITOT 0.4 0.5  PROT 7.6 8.3  ALBUMIN 3.7 4.0    Basename 09/23/11 0131  LIPASE 29  AMYLASE --    Basename 09/24/11 0612 09/23/11 0131 09/22/11 1631  WBC 9.8 6.3 --  NEUTROABS -- 5.0 8.3*  HGB 14.2 14.0 --  HCT 41.1 41.3 --  MCV 79.8 81.0 --  PLT 187 179 --    Basename 09/23/11 0131  CKTOTAL 70  CKMB 1.9  CKMBINDEX --  TROPONINI <0.30   No components found with this basename: POCBNP:3 No results found for this basename: DDIMER:2 in the last 72 hours  Basename 09/23/11 0131  HGBA1C 8.5*   No results found for this basename: CHOL:2,HDL:2,LDLCALC:2,TRIG:2,CHOLHDL:2,LDLDIRECT:2 in  the last 72 hours No results found for this basename: TSH,T4TOTAL,FREET3,T3FREE,THYROIDAB in the last 72 hours No results found for this basename: VITAMINB12:2,FOLATE:2,FERRITIN:2,TIBC:2,IRON:2,RETICCTPCT:2 in the last 72 hours  Micro Results: Recent Results (from the past 240 hour(s))  URINE CULTURE     Status: Normal   Collection Time   09/22/11  2:12 PM      Component Value Range Status Comment   Specimen Description URINE, CLEAN CATCH   Final    Special Requests NONE   Final    Culture  Setup Time [id number]   Final    Colony Count 50,000 COLONIES/ML   Final    Culture     Final    Value: Multiple bacterial morphotypes present, none predominant. Suggest appropriate recollection if clinically indicated.   Report Status 09/23/2011 FINAL   Final   CULTURE, BLOOD (ROUTINE X 2)     Status: Normal (Preliminary result)   Collection Time   09/23/11  1:30 AM      Component Value Range Status Comment   Specimen Description BLOOD RIGHT ARM   Final    Special Requests BOTTLES DRAWN AEROBIC AND ANAEROBIC 10CC EACH   Final  Culture  Setup Time [id number]   Final    Culture     Final    Value:        BLOOD CULTURE RECEIVED NO GROWTH TO DATE CULTURE WILL BE HELD FOR 5 DAYS BEFORE ISSUING A FINAL NEGATIVE REPORT   Report Status PENDING   Incomplete   CULTURE, BLOOD (ROUTINE X 2)     Status: Normal (Preliminary result)   Collection Time   09/23/11  1:40 AM      Component Value Range Status Comment   Specimen Description BLOOD LEFT HAND   Final    Special Requests BOTTLES DRAWN AEROBIC AND ANAEROBIC [facility] EACH   Final    Culture  Setup Time [id number]   Final    Culture     Final    Value: GRAM POSITIVE COCCI IN PAIRS AND CHAINS     Note: Gram Stain Report Called to,Read Back By and Verified With: TROCE HOOD RN @0800  ON 09/25/2011 BY MCLET   Report Status PENDING   Incomplete     Studies/Results: No results found.  Medications: I have reviewed the patient's current medications. Scheduled  Meds:    . hydrochlorothiazide  25 mg Oral Daily  . insulin aspart  0-15 Units Subcutaneous TID WC  . insulin aspart  0-5 Units Subcutaneous QHS  . insulin aspart  3 Units Subcutaneous TID WC  . insulin glargine  15 Units Subcutaneous QHS  . living well with diabetes book   Does not apply Once  . losartan  50 mg Oral Daily  . metoCLOPramide  5 mg Oral TID AC & HS  . pantoprazole  40 mg Oral Q1200  . potassium chloride  10 mEq Intravenous Q1 Hr x 3  . potassium chloride  40 mEq Oral BID  . simvastatin  20 mg Oral q1800  . sodium chloride  3 mL Intravenous Q12H   Continuous Infusions:    . DISCONTD: sodium chloride 75 mL/hr at 09/23/11 1859   PRN Meds:.acetaminophen, acetaminophen, dextrose, labetalol, ondansetron (ZOFRAN) IV, ondansetron  Assessment/Plan: 1. Nausea and vomiting - this most likely could be viral. Her LFTs are normal. And she does not complain of any abdominal pain or diarrhea and abdomen on exam is benign. IVF- resolved on 3/8  #2. Uncontrolled diabetes mellitus 2 with pro DKA/HONK - patient does have anion gap and ketones in the urine.  Probable DKA with a gap of 18 since resolved on insulin gtt- restarted lantus, SSI- Hga1c was 12 now 8.5 so patient is better controlled since 11/12- unable to safely give self medications, no family able to help so patient will need SNF  #3. Headache - could be part of a viral process. Check influenza PCR (negative)- resolved  #4. Subjective feeling of fever and chills - resolved- ? Viral illness .  #5. Uncontrolled hypertension - losartan and hydralazine- stop HCTZ due to CKD   #6. History of hyperlipidemia and previous stroke.   #7 hypokalemia- repleat and recheck, check Mg  #8 CKD- baseline per old labs about 1.36  #9: + BC 1/2- most likely contaminate, no fever, no WBCS- will continue to monitor   Will need PT consult   CODE STATUS - full code.   DISPO SNF when bed available    LOS: 3 days  Amalie Koran,  DO 09/25/2011, 12:57 PM

## 2011-09-26 NOTE — Progress Notes (Signed)
Observed pt administering insulin. Pt has difficulty pressing syringe to engage needle and has difficulty pressing plunger to adm insulin. She is able to correctly dial in the proper dose though.

## 2011-09-26 NOTE — Progress Notes (Signed)
Subjective: Eating small amounts of food Having trouble giving herself insulin- has trouble with her vision Agreeable for SNF    Objective: Vital signs in last 24 hours: Filed Vitals:   09/25/11 2039 09/25/11 2256 09/26/11 0414 09/26/11 0646  BP: 135/82 143/65 148/73 133/58  Pulse: 96  101   Temp: 99.2 F (37.3 C)  99.9 F (37.7 C)   TempSrc: Oral  Oral   Resp: 18  20   Height:      Weight:   57.289 kg (126 lb 4.8 oz)   SpO2: 100%  100%    Weight change: 0.136 kg (4.8 oz)  Intake/Output Summary (Last 24 hours) at 09/26/11 1107 Last data filed at 09/26/11 7829  Gross per 24 hour  Intake    700 ml  Output    575 ml  Net    125 ml    Physical Exam: General: Awake, Oriented, no acute distress today- soft spoken HEENT: EOMI. Neck: Supple, no JVD CV: S1 and S2, RRR Lungs: Clear to ascultation bilaterally, no wheezing Abdomen: Soft, Nontender, Nondistended, +bowel sounds. Ext: Good pulses. Trace edema.   Lab Results:  Basename 09/25/11 0620 09/24/11 0905 09/24/11 0612  NA 139 -- 138  K 4.0 -- 2.8*  CL 104 -- 102  CO2 25 -- 26  GLUCOSE 134* -- 102*  BUN 16 -- 13  CREATININE 1.31* -- 0.95  CALCIUM 9.6 -- 8.5  MG -- 1.7 --  PHOS -- -- --   No results found for this basename: AST:2,ALT:2,ALKPHOS:2,BILITOT:2,PROT:2,ALBUMIN:2 in the last 72 hours No results found for this basename: LIPASE:2,AMYLASE:2 in the last 72 hours  Basename 09/24/11 0612  WBC 9.8  NEUTROABS --  HGB 14.2  HCT 41.1  MCV 79.8  PLT 187   No results found for this basename: CKTOTAL:3,CKMB:3,CKMBINDEX:3,TROPONINI:3 in the last 72 hours No components found with this basename: POCBNP:3 No results found for this basename: DDIMER:2 in the last 72 hours No results found for this basename: HGBA1C:2 in the last 72 hours No results found for this basename: CHOL:2,HDL:2,LDLCALC:2,TRIG:2,CHOLHDL:2,LDLDIRECT:2 in the last 72 hours No results found for this basename: TSH,T4TOTAL,FREET3,T3FREE,THYROIDAB  in the last 72 hours No results found for this basename: VITAMINB12:2,FOLATE:2,FERRITIN:2,TIBC:2,IRON:2,RETICCTPCT:2 in the last 72 hours  Micro Results: Recent Results (from the past 240 hour(s))  URINE CULTURE     Status: Normal   Collection Time   09/22/11  2:12 PM      Component Value Range Status Comment   Specimen Description URINE, CLEAN CATCH   Final    Special Requests NONE   Final    Culture  Setup Time [id number]   Final    Colony Count 50,000 COLONIES/ML   Final    Culture     Final    Value: Multiple bacterial morphotypes present, none predominant. Suggest appropriate recollection if clinically indicated.   Report Status 09/23/2011 FINAL   Final   CULTURE, BLOOD (ROUTINE X 2)     Status: Normal (Preliminary result)   Collection Time   09/23/11  1:30 AM      Component Value Range Status Comment   Specimen Description BLOOD RIGHT ARM   Final    Special Requests BOTTLES DRAWN AEROBIC AND ANAEROBIC [facility]   Final    Culture  Setup Time [id number]   Final    Culture     Final    Value:        BLOOD CULTURE RECEIVED NO GROWTH TO DATE CULTURE WILL BE HELD FOR  5 DAYS BEFORE ISSUING A FINAL NEGATIVE REPORT   Report Status PENDING   Incomplete   CULTURE, BLOOD (ROUTINE X 2)     Status: Normal (Preliminary result)   Collection Time   09/23/11  1:40 AM      Component Value Range Status Comment   Specimen Description BLOOD LEFT HAND   Final    Special Requests BOTTLES DRAWN AEROBIC AND ANAEROBIC [facility] EACH   Final    Culture  Setup Time [id number]   Final    Culture     Final    Value: GRAM POSITIVE COCCI IN PAIRS AND CHAINS     Note: Gram Stain Report Called to,Read Back By and Verified With: TROCE HOOD RN @0800  ON 09/25/2011 BY MCLET   Report Status PENDING   Incomplete     Studies/Results: No results found.  Medications: I have reviewed the patient's current medications. Scheduled Meds:    . Flexpen Starter Kit  1 kit Other Once  . hydrALAZINE  25 mg Oral Q8H  .  insulin aspart  0-15 Units Subcutaneous TID WC  . insulin aspart  0-5 Units Subcutaneous QHS  . insulin aspart  3 Units Subcutaneous TID WC  . insulin glargine  15 Units Subcutaneous QHS  . losartan  50 mg Oral Daily  . metoCLOPramide  5 mg Oral TID AC & HS  . pantoprazole  40 mg Oral Q1200  . potassium chloride  40 mEq Oral BID  . simvastatin  20 mg Oral q1800  . sodium chloride  3 mL Intravenous Q12H  . DISCONTD: hydrochlorothiazide  25 mg Oral Daily   Continuous Infusions:   PRN Meds:.acetaminophen, acetaminophen, dextrose, ondansetron (ZOFRAN) IV, ondansetron, DISCONTD: labetalol  Assessment/Plan: 1. Nausea and vomiting - this most likely could be viral. Her LFTs are normal. And she does not complain of any abdominal pain or diarrhea and abdomen on exam is benign. IVF- resolved on 3/8  #2. Uncontrolled diabetes mellitus 2 with pro DKA/HONK - patient does have anion gap and ketones in the urine.  Probable DKA with a gap of 18 since resolved on insulin gtt- restarted lantus, SSI- Hga1c was 12 now 8.5 so patient is better controlled since 11/12- unable to safely give self medications, no family able to help so patient will need SNF  #3. Headache - could be part of a viral process. Check influenza PCR (negative)- resolved  #4. Subjective feeling of fever and chills - resolved- ? Viral illness .  #5. Uncontrolled hypertension - losartan and hydralazine- stop HCTZ due to CKD   #6. History of hyperlipidemia and previous stroke.   #7 hypokalemia- repleat and recheck, check Mg  #8 CKD- baseline per old labs about 1.36- recheck BMP  #9: + BC 1/2- most likely contaminate, no fever, no WBCS- will continue to monitor    CODE STATUS - full code.   DISPO SNF when bed available    LOS: 4 days  Tameka Hoiland, DO 09/26/2011, 11:07 AM

## 2011-09-27 LAB — GLUCOSE, CAPILLARY
Glucose-Capillary: 143 mg/dL — ABNORMAL HIGH (ref 70–99)
Glucose-Capillary: 266 mg/dL — ABNORMAL HIGH (ref 70–99)
Glucose-Capillary: 147 mg/dL — ABNORMAL HIGH (ref 70–99)
Glucose-Capillary: 223 mg/dL — ABNORMAL HIGH (ref 70–99)
Glucose-Capillary: 143 mg/dL — ABNORMAL HIGH (ref 70–99)

## 2011-09-27 LAB — BASIC METABOLIC PANEL
BUN: 21 mg/dL (ref 6–23)
CO2: 21 mEq/L (ref 19–32)
Calcium: 9.3 mg/dL (ref 8.4–10.5)
Glucose, Bld: 180 mg/dL — ABNORMAL HIGH (ref 70–99)
Sodium: 133 mEq/L — ABNORMAL LOW (ref 135–145)

## 2011-09-27 MED ORDER — METOPROLOL TARTRATE 12.5 MG HALF TABLET
12.5000 mg | ORAL_TABLET | Freq: Two times a day (BID) | ORAL | Status: DC
  Administered 2011-09-27 – 2011-09-28 (×3): 12.5 mg via ORAL
  Filled 2011-09-27 (×4): qty 1

## 2011-09-27 MED ORDER — POLYETHYLENE GLYCOL 3350 17 G PO PACK
17.0000 g | PACK | Freq: Every day | ORAL | Status: DC
  Administered 2011-09-27 – 2011-09-28 (×2): 17 g via ORAL
  Filled 2011-09-27 (×2): qty 1

## 2011-09-27 MED ORDER — GLYCERIN (LAXATIVE) 2.1 G RE SUPP
1.0000 | Freq: Every day | RECTAL | Status: DC | PRN
  Administered 2011-09-27: 18:00:00 via RECTAL
  Filled 2011-09-27: qty 1

## 2011-09-27 MED ORDER — HYDRALAZINE HCL 10 MG PO TABS
10.0000 mg | ORAL_TABLET | Freq: Three times a day (TID) | ORAL | Status: DC
  Administered 2011-09-27 – 2011-09-28 (×4): 10 mg via ORAL
  Filled 2011-09-27 (×6): qty 1

## 2011-09-27 NOTE — Progress Notes (Signed)
Subjective: Eating small amounts of food Having trouble giving herself insulin- has trouble with her vision Agreeable for SNF    Objective: Vital signs in last 24 hours: Filed Vitals:   09/26/11 0646 09/26/11 1400 09/26/11 2004 09/27/11 0427  BP: 133/58 140/63 126/73 151/89  Pulse:  103 85 99  Temp:  98.3 F (36.8 C) 98.9 F (37.2 C) 99.1 F (37.3 C)  TempSrc:  Oral Oral   Resp:  20 20 20   Height:      Weight:    57.8 kg (127 lb 6.8 oz)  SpO2:  100% 100% 100%   Weight change: 0.511 kg (1 lb 2 oz)  Intake/Output Summary (Last 24 hours) at 09/27/11 1133 Last data filed at 09/27/11 0600  Gross per 24 hour  Intake    590 ml  Output    926 ml  Net   -336 ml    Physical Exam: General: Awake, Oriented, no acute distress today- soft spoken HEENT: EOMI. Neck: Supple, no JVD CV: S1 and S2, RRR Lungs: Clear to ascultation bilaterally, no wheezing Abdomen: Soft, Nontender, Nondistended, +bowel sounds. Ext: Good pulses. Trace edema.   Lab Results:  Basename 09/27/11 0514 09/25/11 0620  NA 133* 139  K 4.8 4.0  CL 101 104  CO2 21 25  GLUCOSE 180* 134*  BUN 21 16  CREATININE 1.36* 1.31*  CALCIUM 9.3 9.6  MG -- --  PHOS -- --   No results found for this basename: AST:2,ALT:2,ALKPHOS:2,BILITOT:2,PROT:2,ALBUMIN:2 in the last 72 hours No results found for this basename: LIPASE:2,AMYLASE:2 in the last 72 hours No results found for this basename: WBC:2,NEUTROABS:2,HGB:2,HCT:2,MCV:2,PLT:2 in the last 72 hours No results found for this basename: CKTOTAL:3,CKMB:3,CKMBINDEX:3,TROPONINI:3 in the last 72 hours No components found with this basename: POCBNP:3 No results found for this basename: DDIMER:2 in the last 72 hours No results found for this basename: HGBA1C:2 in the last 72 hours No results found for this basename: CHOL:2,HDL:2,LDLCALC:2,TRIG:2,CHOLHDL:2,LDLDIRECT:2 in the last 72 hours No results found for this basename: TSH,T4TOTAL,FREET3,T3FREE,THYROIDAB in the last 72  hours No results found for this basename: VITAMINB12:2,FOLATE:2,FERRITIN:2,TIBC:2,IRON:2,RETICCTPCT:2 in the last 72 hours  Micro Results: Recent Results (from the past 240 hour(s))  URINE CULTURE     Status: Normal   Collection Time   09/22/11  2:12 PM      Component Value Range Status Comment   Specimen Description URINE, CLEAN CATCH   Final    Special Requests NONE   Final    Culture  Setup Time [id number]   Final    Colony Count 50,000 COLONIES/ML   Final    Culture     Final    Value: Multiple bacterial morphotypes present, none predominant. Suggest appropriate recollection if clinically indicated.   Report Status 09/23/2011 FINAL   Final   CULTURE, BLOOD (ROUTINE X 2)     Status: Normal (Preliminary result)   Collection Time   09/23/11  1:30 AM      Component Value Range Status Comment   Specimen Description BLOOD RIGHT ARM   Final    Special Requests BOTTLES DRAWN AEROBIC AND ANAEROBIC [facility] EACH   Final    Culture  Setup Time [id number]   Final    Culture     Final    Value:        BLOOD CULTURE RECEIVED NO GROWTH TO DATE CULTURE WILL BE HELD FOR 5 DAYS BEFORE ISSUING A FINAL NEGATIVE REPORT   Report Status PENDING   Incomplete   CULTURE, BLOOD (  ROUTINE X 2)     Status: Normal (Preliminary result)   Collection Time   09/23/11  1:40 AM      Component Value Range Status Comment   Specimen Description BLOOD LEFT HAND   Final    Special Requests BOTTLES DRAWN AEROBIC AND ANAEROBIC [facility]   Final    Culture  Setup Time [id number]   Final    Culture     Final    Value: GRAM POSITIVE COCCI IN PAIRS AND CHAINS     Note: Gram Stain Report Called to,Read Back By and Verified With: TROCE HOOD RN @0800  ON 09/25/2011 BY MCLET   Report Status PENDING   Incomplete     Studies/Results: No results found.  Medications: I have reviewed the patient's current medications. Scheduled Meds:    . hydrALAZINE  10 mg Oral Q8H  . insulin aspart  0-15 Units Subcutaneous TID WC  . insulin  aspart  0-5 Units Subcutaneous QHS  . insulin aspart  3 Units Subcutaneous TID WC  . insulin glargine  15 Units Subcutaneous QHS  . losartan  50 mg Oral Daily  . metoCLOPramide  5 mg Oral TID AC & HS  . metoprolol tartrate  12.5 mg Oral BID  . pantoprazole  40 mg Oral Q1200  . potassium chloride  40 mEq Oral BID  . simvastatin  20 mg Oral q1800  . sodium chloride  3 mL Intravenous Q12H  . DISCONTD: hydrALAZINE  25 mg Oral Q8H   Continuous Infusions:   PRN Meds:.acetaminophen, acetaminophen, dextrose, ondansetron (ZOFRAN) IV, ondansetron  Assessment/Plan: 1. Nausea and vomiting - this most likely could be viral. Her LFTs are normal. And she does not complain of any abdominal pain or diarrhea and abdomen on exam is benign. IVF- resolved on 3/8  #2. Uncontrolled diabetes mellitus 2 with pro DKA/HONK - patient does have anion gap and ketones in the urine.  Probable DKA with a gap of 18 since resolved on insulin gtt- restarted lantus, SSI- Hga1c was 12 now 8.5 so patient is better controlled since 11/12- unable to safely give self medications, no family able to help so patient will need SNF  #3. Headache - could be part of a viral process. Check influenza PCR (negative)- resolved  #4. Subjective feeling of fever and chills - resolved- ? Viral illness .  #5. Uncontrolled hypertension - losartan and hydralazine- stop HCTZ due to CKD   #6. History of hyperlipidemia and previous stroke.   #7 hypokalemia- repleat and recheck, check Mg  #8 CKD- baseline per old labs about 1.36- recheck BMP  #9: + BC 1/2- most likely contaminate, no fever, no WBCS- will continue to monitor    CODE STATUS - full code.   DISPO SNF when bed available    LOS: 5 days  [name] Saperstein, DO 09/27/2011, 11:33 AM

## 2011-09-27 NOTE — Progress Notes (Signed)
Bed Offers given.  Pt stated she would share with family. Milus Banister MSW,LCSW w/e Coverage [phone]

## 2011-09-27 NOTE — Progress Notes (Signed)
Again observed pt giving insulin . Seems confused at times as to how to dial up the insulin. Then is unable to press syringe hard enough to engage needle. Pt much more alert today. Pi ambulated down hall with aid of rolling walker without distress.

## 2011-09-28 LAB — GLUCOSE, CAPILLARY
Glucose-Capillary: 286 mg/dL — ABNORMAL HIGH (ref 70–99)
Glucose-Capillary: 103 mg/dL — ABNORMAL HIGH (ref 70–99)
Glucose-Capillary: 253 mg/dL — ABNORMAL HIGH (ref 70–99)

## 2011-09-28 MED ORDER — INSULIN GLARGINE 100 UNIT/ML ~~LOC~~ SOLN: 15.0000 [IU] | Freq: Every day | SUBCUTANEOUS | Status: DC

## 2011-09-28 MED ORDER — METOCLOPRAMIDE HCL 5 MG PO TABS: 5.0000 mg | ORAL_TABLET | Freq: Three times a day (TID) | ORAL | Status: DC

## 2011-09-28 MED ORDER — HYDRALAZINE HCL 10 MG PO TABS: 10.0000 mg | ORAL_TABLET | Freq: Three times a day (TID) | ORAL | Status: AC

## 2011-09-28 MED ORDER — GLYCERIN (LAXATIVE) 2.1 G RE SUPP: 1.0000 | Freq: Every day | RECTAL | Status: DC | PRN

## 2011-09-28 MED ORDER — METOPROLOL TARTRATE 12.5 MG HALF TABLET: 12.5000 mg | ORAL_TABLET | Freq: Two times a day (BID) | ORAL | Status: DC

## 2011-09-28 MED ORDER — INSULIN ASPART 100 UNIT/ML ~~LOC~~ SOLN: 3.0000 [IU] | Freq: Three times a day (TID) | SUBCUTANEOUS | Status: DC

## 2011-09-28 MED ORDER — INSULIN ASPART 100 UNIT/ML ~~LOC~~ SOLN: 0.0000 [IU] | Freq: Three times a day (TID) | SUBCUTANEOUS | Status: DC

## 2011-09-28 MED ORDER — INSULIN ASPART 100 UNIT/ML ~~LOC~~ SOLN: 0.0000 [IU] | Freq: Every day | SUBCUTANEOUS | Status: DC

## 2011-09-28 NOTE — Discharge Summary (Addendum)
Discharge Summary  [name] [name] MR#: [id number]  DOB:Dec 13, 1940  Date of Admission: 09/22/2011 Date of Discharge: 09/28/2011  Patient's PCP: Gaye Alken, MD, MD  Attending Physician:Odalis Jordan     Discharge Diagnoses: Gastroparesis Nausea/Vomiting resolved Diabetes- uncontrolled, insulin dependant HTN CKD   Brief Admitting History and Physical 71 year female with history of diabetes mellitus type 2, hypertension, previous history of CVA, hyperlipidemia presented to the ER because of persistent nausea and vomiting over the last 2 days. Patient stated that she is not been able take anything in, as she throws up. In addition patient has been also having headache which is mostly in the frontal area but has no focal deficits or visual symptoms. Patient also has been experiencing some chills and fever. In the ER patient's exam was showing a benign abdomen x-rays did not show anything acute. Patient has been admitted for further workup. Patient denies any chest pain shortness of breath abdominal pain or diarrhea. Denies any focal deficits   Discharge Medications Medication List  As of 09/28/2011 12:44 PM   STOP taking these medications         glipiZIDE 10 MG tablet      hydrochlorothiazide 25 MG tablet      insulin detemir 100 UNIT/ML injection         TAKE these medications         Glycerin (Adult) 2.1 G Supp   Place 1 suppository rectally daily as needed.      hydrALAZINE 10 MG tablet   Commonly known as: APRESOLINE   Take 1 tablet (10 mg total) by mouth every 8 (eight) hours.      insulin aspart 100 UNIT/ML injection   Commonly known as: novoLOG   Inject 0-15 Units into the skin 3 (three) times daily with meals.      insulin aspart 100 UNIT/ML injection   Commonly known as: novoLOG   Inject 0-5 Units into the skin at bedtime.      insulin aspart 100 UNIT/ML injection   Commonly known as: novoLOG   Inject 3 Units into the skin 3 (three) times daily  with meals.      insulin glargine 100 UNIT/ML injection   Commonly known as: LANTUS   Inject 15 Units into the skin at bedtime.      losartan 50 MG tablet   Commonly known as: COZAAR   Take 50 mg by mouth daily.      lovastatin 20 MG tablet   Commonly known as: MEVACOR   Take 20 mg by mouth daily.      metoCLOPramide 5 MG tablet   Commonly known as: REGLAN   Take 1 tablet (5 mg total) by mouth 4 (four) times daily -  before meals and at bedtime.      metoprolol tartrate 12.5 mg Tabs   Commonly known as: LOPRESSOR   Take 0.5 tablets (12.5 mg total) by mouth 2 (two) times daily.      omeprazole 40 MG capsule   Commonly known as: PRILOSEC   Take 40 mg by mouth daily.      TYLENOL ARTHRITIS PAIN 650 MG CR tablet   Generic drug: acetaminophen   Take 650 mg by mouth every 8 (eight) hours as needed. For pain.      Vitamin D3 2000 UNITS Tabs   Take 1 tablet by mouth daily.            Hospital Course: #1. Nausea and vomiting - resolved, this most likely  could be viral. Her LFTs are normal. And she does not complain of any abdominal pain or diarrhea and abdomen on exam is benign. IVF- resolved on 3/8 or could have been a component of diabetic gastroparesis  #2. Uncontrolled diabetes mellitus 2 with pro DKA/HONK - patient did have anion gap and ketones in the urine. Probable DKA with a gap of 18 since resolved on insulin gtt- restarted lantus, SSI- Hga1c was 12 now 8.5 so patient is better controlled since 11/12- unable to safely give self medications, no family able to help so patient will need SNF  #3. Headache - could be part of a viral process. Check influenza PCR (negative)- resolved  #4. Subjective feeling of fever and chills - resolved- ? Viral illness  #5. Uncontrolled hypertension - losartan and hydralazine and metoprolol- stop HCTZ due to CKD  #6. History of hyperlipidemia and previous stroke.  #7 hypokalemia- repleat and recheck, check Mg  #8 CKD-stage 3 baseline per old  labs about 1.36- recheck BMP  #9: + BC 1/2- most likely contaminate, no fever, no WBCS- will continue to monitor     Day of Discharge BP 143/78  Pulse 80  Temp(Src) 99.3 F (37.4 C) (Oral)  Resp 19  Ht 4\' 10"  (1.473 m)  Wt 57.199 kg (126 lb 1.6 oz)  BMI 26.36 kg/m2  SpO2 100%  Results for orders placed during the hospital encounter of 09/22/11 (from the past 48 hour(s))  GLUCOSE, CAPILLARY     Status: Abnormal   Collection Time   09/26/11  4:09 PM      Component Value Range Comment   Glucose-Capillary 147 (*) 70 - 99 (mg/dL)    Comment 1 Documented in Chart      Comment 2 Notify RN     GLUCOSE, CAPILLARY     Status: Abnormal   Collection Time   09/26/11  9:03 PM      Component Value Range Comment   Glucose-Capillary 132 (*) 70 - 99 (mg/dL)    Comment 1 Notify RN     BASIC METABOLIC PANEL     Status: Abnormal   Collection Time   09/27/11  5:14 AM      Component Value Range Comment   Sodium 133 (*) 135 - 145 (mEq/L)    Potassium 4.8  3.5 - 5.1 (mEq/L)    Chloride 101  96 - 112 (mEq/L)    CO2 21  19 - 32 (mEq/L)    Glucose, Bld 180 (*) 70 - 99 (mg/dL)    BUN 21  6 - 23 (mg/dL)    Creatinine, Ser 1.61 (*) 0.50 - 1.10 (mg/dL)    Calcium 9.3  8.4 - 10.5 (mg/dL)    GFR calc non Af Amer 38 (*) >90 (mL/min)    GFR calc Af Amer 44 (*) >90 (mL/min)   GLUCOSE, CAPILLARY     Status: Abnormal   Collection Time   09/27/11  6:13 AM      Component Value Range Comment   Glucose-Capillary 173 (*) 70 - 99 (mg/dL)    Comment 1 Notify RN     GLUCOSE, CAPILLARY     Status: Abnormal   Collection Time   09/27/11 11:10 AM      Component Value Range Comment   Glucose-Capillary 223 (*) 70 - 99 (mg/dL)    Comment 1 Documented in Chart      Comment 2 Notify RN     GLUCOSE, CAPILLARY     Status: Abnormal  Collection Time   09/27/11  4:16 PM      Component Value Range Comment   Glucose-Capillary 143 (*) 70 - 99 (mg/dL)    Comment 1 Documented in Chart      Comment 2 Notify RN     GLUCOSE,  CAPILLARY     Status: Abnormal   Collection Time   09/27/11 10:21 PM      Component Value Range Comment   Glucose-Capillary 286 (*) 70 - 99 (mg/dL)   GLUCOSE, CAPILLARY     Status: Abnormal   Collection Time   09/28/11  6:13 AM      Component Value Range Comment   Glucose-Capillary 103 (*) 70 - 99 (mg/dL)    Comment 1 Notify RN      Comment 2 Documented in Chart     GLUCOSE, CAPILLARY     Status: Abnormal   Collection Time   09/28/11 11:42 AM      Component Value Range Comment   Glucose-Capillary 253 (*) 70 - 99 (mg/dL)    Comment 1 Notify RN       Dg Abd 1 View  09/22/2011  *RADIOLOGY REPORT*  Clinical Data: Fever, vomiting  ABDOMEN - 1 VIEW  Comparison: None.  Findings: Scattered air and stool throughout the bowel.  Negative for definite obstruction or ileus.  Lung bases clear. Atherosclerosis of the aorta and iliac bifurcation.  Mild degenerative changes of the spine.  No acute osseous finding.  No abnormal calcifications.  IMPRESSION: Nonobstructive bowel gas pattern.  Original Report Authenticated By: Judie Petit. Ruel Favors, M.D.   Ct Head Wo Contrast  09/22/2011  *RADIOLOGY REPORT*  Clinical Data: Headache.  Weakness.  Previous stroke.  CT HEAD WITHOUT CONTRAST  Technique:  Contiguous axial images were obtained from the base of the skull through the vertex without contrast.  Comparison: None.  Findings: There is no acute intracranial hemorrhage, infarction, or mass lesion.  There are multi focal old infarcts in the right middle cerebral artery distribution with secondary encephalomalacia. There are also small scattered areas of periventricular white matter lucency in the left cerebral hemisphere consistent with small vessel ischemic disease.  Mild diffuse cerebral cortical atrophy.  Moderate cerebellar atrophy.  No osseous abnormality.  IMPRESSION:  1.  No acute intracranial abnormality. 2.  Old right middle cerebral artery infarcts with chronic small vessel ischemic changes in the left cerebral  hemisphere.  Original Report Authenticated By: Gwynn Burly, M.D.   Dg Chest Port 1 View  09/22/2011  *RADIOLOGY REPORT*  Clinical Data: Low grade fever and weakness.  PORTABLE CHEST - 1 VIEW  Comparison: Chest x-ray 05/27/2011.  Findings: Lung volumes are normal.  No consolidative airspace disease.  No pleural effusions.  No pneumothorax.  No pulmonary nodule or mass noted.  Pulmonary vasculature and the cardiomediastinal silhouette are within normal limits. Atherosclerotic calcifications within the aortic arch. Multiple calcified granulomas in the apices bilaterally.  IMPRESSION: 1.  No radiographic evidence of acute cardiopulmonary disease. 2.  Atherosclerosis.  Original Report Authenticated By: Florencia Reasons, M.D.     Disposition: SNF  Diet: cardiac/diabetic  Activity: as tolerated   Follow-up Appts: PCP once released from SNF  Discharge Orders    Future Appointments: Provider: Department: Dept Phone: Center:   10/08/2011 1:00 PM [facility] [phone] [facility]   10/08/2011 2:00 PM Sherren Kerns, MD [facility] [phone] [facility]     Future Orders Please Complete By Expires   Diet - low sodium heart healthy  Diet Carb Modified      Increase activity slowly      Discharge instructions      Comments:   Check blood sugar QID  Lantus+ novolog+ SSI       Time spent on discharge, talking to the patient, and coordinating care: 38 mins.   SignedMarlin Canary, DO 09/28/2011, 12:44 PM

## 2011-09-28 NOTE — Progress Notes (Addendum)
Subjective: Eating small amounts of food Having trouble giving herself insulin- has trouble with her vision Agreeable for SNF    Objective: Vital signs in last 24 hours: Filed Vitals:   09/27/11 0427 09/27/11 1400 09/27/11 2245 09/28/11 0514  BP: 151/89 121/61 124/88 143/78  Pulse: 99 88 82 80  Temp: 99.1 F (37.3 C) 98.2 F (36.8 C) 97.9 F (36.6 C) 99.3 F (37.4 C)  TempSrc:  Oral Oral Oral  Resp: 20 18 18 19   Height:      Weight: 57.8 kg (127 lb 6.8 oz)   57.199 kg (126 lb 1.6 oz)  SpO2: 100% 100% 98% 100%   Weight change: -0.601 kg (-1 lb 5.2 oz)  Intake/Output Summary (Last 24 hours) at 09/28/11 1022 Last data filed at 09/28/11 0857  Gross per 24 hour  Intake    600 ml  Output    500 ml  Net    100 ml    Physical Exam: General: Awake, Oriented, no acute distress today- soft spoken HEENT: EOMI. Neck: Supple, no JVD CV: S1 and S2, RRR Lungs: Clear to ascultation bilaterally, no wheezing Abdomen: Soft, Nontender, Nondistended, +bowel sounds. Ext: Good pulses. Trace edema.   Lab Results:  [facility] 09/27/11 0514  NA 133*  K 4.8  CL 101  CO2 21  GLUCOSE 180*  BUN 21  CREATININE 1.36*  CALCIUM 9.3  MG --  PHOS --   No results found for this basename: AST:2,ALT:2,ALKPHOS:2,BILITOT:2,PROT:2,ALBUMIN:2 in the last 72 hours No results found for this basename: LIPASE:2,AMYLASE:2 in the last 72 hours No results found for this basename: WBC:2,NEUTROABS:2,HGB:2,HCT:2,MCV:2,PLT:2 in the last 72 hours No results found for this basename: CKTOTAL:3,CKMB:3,CKMBINDEX:3,TROPONINI:3 in the last 72 hours No components found with this basename: POCBNP:3 No results found for this basename: DDIMER:2 in the last 72 hours No results found for this basename: HGBA1C:2 in the last 72 hours No results found for this basename: CHOL:2,HDL:2,LDLCALC:2,TRIG:2,CHOLHDL:2,LDLDIRECT:2 in the last 72 hours No results found for this basename: TSH,T4TOTAL,FREET3,T3FREE,THYROIDAB in the  last 72 hours No results found for this basename: VITAMINB12:2,FOLATE:2,FERRITIN:2,TIBC:2,IRON:2,RETICCTPCT:2 in the last 72 hours  Micro Results: Recent Results (from the past 240 hour(s))  URINE CULTURE     Status: Normal   Collection Time   09/22/11  2:12 PM      Component Value Range Status Comment   Specimen Description URINE, CLEAN CATCH   Final    Special Requests NONE   Final    Culture  Setup Time [id number]   Final    Colony Count 50,000 COLONIES/ML   Final    Culture     Final    Value: Multiple bacterial morphotypes present, none predominant. Suggest appropriate recollection if clinically indicated.   Report Status 09/23/2011 FINAL   Final   CULTURE, BLOOD (ROUTINE X 2)     Status: Normal (Preliminary result)   Collection Time   09/23/11  1:30 AM      Component Value Range Status Comment   Specimen Description BLOOD RIGHT ARM   Final    Special Requests BOTTLES DRAWN AEROBIC AND ANAEROBIC 10CC EACH   Final    Culture  Setup Time [id number]   Final    Culture     Final    Value:        BLOOD CULTURE RECEIVED NO GROWTH TO DATE CULTURE WILL BE HELD FOR 5 DAYS BEFORE ISSUING A FINAL NEGATIVE REPORT   Report Status PENDING   Incomplete   CULTURE, BLOOD (ROUTINE X 2)  Status: Normal (Preliminary result)   Collection Time   09/23/11  1:40 AM      Component Value Range Status Comment   Specimen Description BLOOD LEFT HAND   Final    Special Requests BOTTLES DRAWN AEROBIC AND ANAEROBIC [facility] EACH   Final    Culture  Setup Time [id number]   Final    Culture     Final    Value: GRAM POSITIVE COCCI IN PAIRS AND CHAINS     Note: Gram Stain Report Called to,Read Back By and Verified With: TROCE HOOD RN @0800  ON 09/25/2011 BY MCLET   Report Status PENDING   Incomplete     Studies/Results: No results found.  Medications: I have reviewed the patient's current medications. Scheduled Meds:    . hydrALAZINE  10 mg Oral Q8H  . insulin aspart  0-15 Units Subcutaneous TID WC  .  insulin aspart  0-5 Units Subcutaneous QHS  . insulin aspart  3 Units Subcutaneous TID WC  . insulin glargine  15 Units Subcutaneous QHS  . losartan  50 mg Oral Daily  . metoCLOPramide  5 mg Oral TID AC & HS  . metoprolol tartrate  12.5 mg Oral BID  . pantoprazole  40 mg Oral Q1200  . polyethylene glycol  17 g Oral Daily  . potassium chloride  40 mEq Oral BID  . simvastatin  20 mg Oral q1800  . sodium chloride  3 mL Intravenous Q12H  . DISCONTD: hydrALAZINE  25 mg Oral Q8H   Continuous Infusions:   PRN Meds:.acetaminophen, acetaminophen, dextrose, Glycerin (Adult), ondansetron (ZOFRAN) IV, ondansetron  Assessment/Plan: 1. Nausea and vomiting - resolved,  this most likely could be viral. Her LFTs are normal. And she does not complain of any abdominal pain or diarrhea and abdomen on exam is benign. IVF- resolved on 3/8 or could have been a component of diabetic gastroparesis  #2. Uncontrolled diabetes mellitus 2 with pro DKA/HONK - patient does have anion gap and ketones in the urine.  Probable DKA with a gap of 18 since resolved on insulin gtt- restarted lantus, SSI- Hga1c was 12 now 8.5 so patient is better controlled since 11/12- unable to safely give self medications, no family able to help so patient will need SNF  #3. Headache - could be part of a viral process. Check influenza PCR (negative)- resolved  #4. Subjective feeling of fever and chills - resolved- ? Viral illness .  #5. Uncontrolled hypertension - losartan and hydralazine- stop HCTZ due to CKD   #6. History of hyperlipidemia and previous stroke.   #7 hypokalemia- repleat and recheck, check Mg  #8 CKD- stage III- baseline per old labs about 1.36- recheck BMP  #9: + BC 1/2- most likely contaminate, no fever, no WBCS- will continue to monitor    CODE STATUS - full code.   DISPO SNF when bed available    LOS: 6 days  Stefen Juba, DO 09/28/2011, 10:22 AM

## 2011-09-28 NOTE — Plan of Care (Signed)
Problem: Problem: Diabetes Management Progression Goal: INCREASE DIABETES KNOWLEDGE Outcome: Adequate for Discharge 3/7 Pt given Diabetes booklet went over S/S of high and lows and how to treat. Got starter kit from pharmacy in order to get pt. Needles for pens. Watching and observing pt correctly dialing up Insulin and administering it to herself. Wrote up in progress notes on how pt did. Marisa Cyphers RN

## 2011-09-28 NOTE — Progress Notes (Signed)
Clinical Child psychotherapist submitted all appropriate paperwork to SNF-[facility], [place].  CSW confirmed dc time with pt, family, RN, and SNF.  CSW arranged transport.  CSW to sign off at time of dc.  Angelia Mould, MSW, [place] [phone]

## 2011-09-28 NOTE — Progress Notes (Signed)
Pt discarged to [facility] nursing facility per ambulance with all belongings. Daughter here aware of pending D/C. Went over all D/C info, med rec, follow up appts, and goals for Pt getting stronger with PT, injecting herself with insulin at least twice a day, and her daughter taking her to get new glasses w/n next 2 weeks. All paperwork given to ambulance crew. Marisa Cyphers RN

## 2011-09-28 NOTE — Clinical Documentation Improvement (Signed)
CKD DOCUMENTATION CLARIFICATION QUERY   THIS DOCUMENT IS NOT A PERMANENT PART OF THE MEDICAL RECORD  Please update your documentation within the medical record to reflect your response to this query.                                                                                        09/28/11   Dr. Benjamine Mola and/or Associates,  In a better effort to capture your patient's severity of illness, reflect appropriate length of stay and utilization of resources, a review of the patient medical record has revealed the following indicators.    Based on your clinical judgment, please document the STAGE of the patient's CKD in the progress notes and discharge summary:   - CKD Stage I -  GFR > OR = 90  - CKD Stage II - GFR 60-80  - CKD Stage III - GFR 30-59  - CKD Stage IV - GFR 15-29  - CKD Stage V - GFR < 15  - ESRD (End Stage Renal Disease)  - Other condition  - Unable Clinically determine  Clinical Indicators:  CKD- baseline per old labs about 1.36- recheck BMP Brettany Sydney, DO  09/28/2011, 10:22 AM    In responding to this query please exercise your independent judgment.  The fact that a query is asked, does not imply that any particular answer is desired or expected.    Reviewed: additional documentation in the medical record   Thank You,  Jerral Ralph  RN BSN Certified Clinical Documentation Specialist: Cell   [phone]  Health Information Management [place]  TO RESPOND TO THE THIS QUERY, FOLLOW THE INSTRUCTIONS BELOW:  1. If needed, update documentation for the patient's encounter via the notes activity.  2. Access this query again and click edit on the In [employer].  3. After updating, or not, click F2 to complete all highlighted (required) fields concerning your review. Select "additional documentation in the medical record" OR "no additional documentation provided".  4. Click Sign note button.  5. The deficiency will fall out of your In Basket *Please  let us know if you are not able to complete this workflow by phone or e-mail (listed below).

## 2011-09-28 NOTE — Progress Notes (Signed)
Clinical Social Worker phoned pt's dtr Okey Regal [phone]) to review bed offers.  Dtr stated she resided in the [facility] area and was interested in this area.  CSW reviewed [facility] and faxed pt out to [facility]. Dtr expressed interest in [facility] (contracted with pt's insurance) and [facility] (NOT contracted with pt's insurance).  CSW phoned both facilities and relayed that pt was ready for dc and they were preferred facilities.  Both to phone CSW back and inform of bed offer.  CSW to continue to follow and assist as needed.  Angelia Mould, MSW, [place] [phone]

## 2011-09-28 NOTE — Plan of Care (Signed)
Problem: Phase II Progression Outcomes Goal: Progress activity as tolerated unless otherwise ordered Outcome: Completed/Met Date Met:  09/28/11 Amb with walker 2 to 3 times a day in hallway

## 2011-09-28 NOTE — Plan of Care (Signed)
Problem: Phase III Progression Outcomes Goal: Other Phase III Outcomes/Goals Outcome: Adequate for Discharge Draw up and inject self with correct amt of Insulin using insulin pen needs reinforcement T Print production planner

## 2011-09-28 NOTE — Progress Notes (Signed)
Physical Therapy Treatment Patient Details Name: [name] [name] MRN: [id number] DOB: 1941/07/10 Today's Date: 09/28/2011  PT Assessment/Plan  PT - Assessment/Plan Comments on Treatment Session: Improvements made in strength, activity tolerance and overall safety, but could still benefit from ST SNF for rehab, before home independent. PT Plan: Discharge plan remains appropriate Follow Up Recommendations: Skilled nursing facility Equipment Recommended: Defer to next venue PT Goals  Acute Rehab PT Goals PT Goal Formulation: With patient PT Goal: Supine/Side to Sit - Progress: Other (comment) Pt will go Sit to Stand: with upper extremity assist PT Goal: Sit to Stand - Progress: Progressing toward goal PT Goal: Stand to Sit - Progress: Progressing toward goal PT Goal: Ambulate - Progress: Progressing toward goal PT Goal: Perform Home Exercise Program - Progress: Not met  PT Treatment Precautions/Restrictions  Precautions Precautions: Fall Required Braces or Orthoses: No Restrictions Weight Bearing Restrictions: No Mobility (including Balance) Bed Mobility Bed Mobility: No Transfers Sit to Stand: 5: Supervision Sit to Stand Details (indicate cue type and reason): no safety cues needed today, used safe hand placement Stand to Sit: 5: Supervision Ambulation/Gait Ambulation/Gait: Yes Ambulation/Gait Assistance: 5: Supervision Ambulation/Gait Assistance Details (indicate cue type and reason): used RW approp. including setting brakes to sit on the rollator seat Ambulation Distance (Feet): 240 Feet Assistive device: Parallel bars;4-wheeled walker Gait Pattern: Step-through pattern;Decreased step length - left;Decreased stance time - left;Decreased stride length (very mild HP gait) Gait velocity: very little discernable change in speed when asked  Posture/Postural Control Posture/Postural Control: No significant limitations Postural Limitations: less flexed than on  eval Balance Balance Assessed: Yes Static Sitting Balance Static Sitting - Balance Support: No upper extremity supported;Feet supported;Feet unsupported Static Sitting - Level of Assistance: 6: Modified independent (Device/Increase time) Static Sitting - Comment/# of Minutes: 5 Static Standing Balance Static Standing - Balance Support: Left upper extremity supported;Right upper extremity supported;During functional activity Static Standing - Level of Assistance: 5: Stand by assistance Exercise    End of Session PT - End of Session Activity Tolerance: Patient tolerated treatment well Patient left: in chair;with call bell in reach Nurse Communication: Mobility status for transfers;Mobility status for ambulation General Behavior During Session: [facility] for tasks performed Cognition: [facility] for tasks performed Cognitive Impairment: improved signs of safety awareness and judgement  Crista Nuon, Eliseo Gum 09/28/2011, 10:47 AM  09/28/2011  Kirkwood Bing, PT [phone] (pager)

## 2011-09-28 NOTE — Progress Notes (Signed)
Report called to [facility] nursing facility in [place]. Spoke with Loraine Leriche, RN. Marisa Cyphers RN

## 2011-09-29 LAB — CULTURE, BLOOD (ROUTINE X 2): Culture  Setup Time: 201303060905

## 2011-09-29 NOTE — Progress Notes (Signed)
   CARE MANAGEMENT NOTE 09/29/2011  Patient:  Canipe,Yessenia A   Account Number:  [id number]  Date Initiated:  09/24/2011  Documentation initiated by:  Letha Cape  Subjective/Objective Assessment:   dx n/v  admit as observation- from home with family.     Action/Plan:   PT eval   Anticipated DC Date:  09/24/2011   Anticipated DC Plan:  HOME/SELF CARE  In-house referral  Clinical Social Worker      DC Planning Services  CM consult      Choice offered to / List presented to:             Status of service:  Completed, signed off Medicare Important Message given?   (If response is "NO", the following Medicare IM given date fields will be blank) Date Medicare IM given:   Date Additional Medicare IM given:    Discharge Disposition:  SKILLED NURSING FACILITY  Per UR Regulation:    If discussed at Long Length of Stay Meetings, dates discussed:    Comments:  09/28/11- Donn Pierini RN, BSN [phone] Pt discharged to SNF, CSW followed for placement needs.  09/25/11- 1415- Donn Pierini RN, BSN [phone] per PT notes recommending SNF for rehab, CSW consulted for possible SNF placement. CM to continue to follow.  09/24/11 9:30 Letha Cape RN, BSN [phone] patient is from home with family.  NCM will continue to follow for dc needs.

## 2011-09-30 LAB — CULTURE, BLOOD (ROUTINE X 2): Culture  Setup Time: 201303060905

## 2011-10-01 ENCOUNTER — Encounter: Payer: Self-pay | Admitting: Vascular Surgery

## 2011-10-07 ENCOUNTER — Encounter: Payer: Self-pay | Admitting: Vascular Surgery

## 2011-10-08 ENCOUNTER — Ambulatory Visit (INDEPENDENT_AMBULATORY_CARE_PROVIDER_SITE_OTHER): Payer: Medicare Other | Admitting: *Deleted

## 2011-10-08 ENCOUNTER — Encounter: Payer: Self-pay | Admitting: Neurosurgery

## 2011-10-08 ENCOUNTER — Ambulatory Visit (INDEPENDENT_AMBULATORY_CARE_PROVIDER_SITE_OTHER): Payer: Medicare Other | Admitting: Neurosurgery

## 2011-10-08 VITALS — BP 147/77 | HR 78 | Resp 16 | Ht 59.0 in | Wt 134.0 lb

## 2011-10-08 DIAGNOSIS — I6529 Occlusion and stenosis of unspecified carotid artery: Secondary | ICD-10-CM

## 2011-10-08 NOTE — Progress Notes (Signed)
[facility] HISTORY AND PHYSICAL   CC: Dr. Dareen Piano Referring Physician:Fields  History of Present Illness: This is a 71 year old female patient of Dr. fields that is seen today for her one-year carotid duplex. The patient reports no change in her condition although she is a poor historian. She apparently is residing in a SNF but has indicated to our staff she is "homeless". The patient did have a left brain CVA in 1998 which resulted in right hemiparesis. She also was 100% occluded on the right when she was here last year for her carotid duplex.  Past Medical History  Diagnosis Date  . Diabetes mellitus   . Hypertension   . CVA (cerebral infarction)   . GERD (gastroesophageal reflux disease)   . HLD (hyperlipidemia)   . Heart murmur   . Carotid artery occlusion 02/15/2009  . Stroke     ROS: [x]  Positive   [ ]  Denies    General: [ ]  Weight loss, [ ]  Fever, [ ]  chills Neurologic: [ ]  Dizziness, [ ]  Blackouts, [ ]  Seizure [ ]  Stroke, [ ]  "Mini stroke", [ ]  Slurred speech, [ ]  Temporary blindness; [ ]  weakness in arms or legs, [ ]  Hoarseness Cardiac: [ ]  Chest pain/pressure, [ ]  Shortness of breath at rest [ ]  Shortness of breath with exertion, [ ]  Atrial fibrillation or irregular heartbeat Vascular: [ ]  Pain in legs with walking, [ ]  Pain in legs at rest, [ ]  Pain in legs at night,  [ ]  Non-healing ulcer, [ ]  Blood clot in vein/DVT,   Pulmonary: [ ]  Home oxygen, [ ]  Productive cough, [ ]  Coughing up blood, [ ]  Asthma,  [ ]  Wheezing Musculoskeletal:  [ ]  Arthritis, [ ]  Low back pain, [ ]  Joint pain Hematologic: [ ]  Easy Bruising, [ ]  Anemia; [ ]  Hepatitis Gastrointestinal: [ ]  Blood in stool, [ ]  Gastroesophageal Reflux/heartburn, [ ]  Trouble swallowing Urinary: [ ]  chronic Kidney disease, [ ]  on HD - [ ]  MWF or [ ]  TTHS, [ ]  Burning with urination, [ ]  Difficulty urinating Skin: [ ]  Rashes, [ ]  Wounds Psychological: [ ]  Anxiety, [ ]  Depression   Social  History History  Substance Use Topics  . Smoking status: Former Smoker    Types: Cigarettes    Quit date: 07/20/1985  . Smokeless tobacco: Never Used  . Alcohol Use: No    Family History Family History  Problem Relation Age of Onset  . Diabetes Mother   . Hypertension Mother   . Diabetes Father   . Hypertension Father   . Diabetes Sister   . Hypertension Sister     Allergies  Allergen Reactions  . Allegra Rash    Current Outpatient Prescriptions  Medication Sig Dispense Refill  . acetaminophen (TYLENOL ARTHRITIS PAIN) 650 MG CR tablet Take 650 mg by mouth every 8 (eight) hours as needed. For pain.      . Cholecalciferol (VITAMIN D3) 2000 UNITS TABS Take 1 tablet by mouth daily.      . hydrALAZINE (APRESOLINE) 10 MG tablet Take 1 tablet (10 mg total) by mouth every 8 (eight) hours.      . insulin aspart (NOVOLOG) 100 UNIT/ML injection Inject 0-15 Units into the skin 3 (three) times daily with meals.  1 vial    . insulin aspart (NOVOLOG) 100 UNIT/ML injection Inject 0-5 Units into the skin at bedtime.  1 vial    . insulin aspart (NOVOLOG) 100 UNIT/ML injection Inject 3  Units into the skin 3 (three) times daily with meals.  1 vial    . insulin glargine (LANTUS) 100 UNIT/ML injection Inject 15 Units into the skin at bedtime.  10 mL    . losartan (COZAAR) 50 MG tablet Take 50 mg by mouth daily.        Marland Kitchen lovastatin (MEVACOR) 20 MG tablet Take 20 mg by mouth daily.        . metoCLOPramide (REGLAN) 5 MG tablet Take 1 tablet (5 mg total) by mouth 4 (four) times daily -  before meals and at bedtime.      . metoprolol tartrate (LOPRESSOR) 12.5 mg TABS Take 0.5 tablets (12.5 mg total) by mouth 2 (two) times daily.      Marland Kitchen omeprazole (PRILOSEC) 40 MG capsule Take 40 mg by mouth daily.        . Glycerin, Adult, 2.1 G SUPP Place 1 suppository rectally daily as needed.        Physical Examination  Filed Vitals:   10/08/11 1413  BP: 147/77  Pulse: 78  Resp:     Body mass index is  27.06 kg/(m^2).  General:  WDWN in NAD Gait: Normal HEENT: WNL Eyes: Pupils equal Pulmonary: normal non-labored breathing , without Rales, rhonchi,  wheezing Cardiac: RRR, without  Murmurs, rubs or gallops; Abdomen: soft, NT, no masses Skin: no rashes, ulcers noted  Vascular Exam Pulses: 1+ bilateral radial pulses. Carotid bruits there is a mild carotid bruit here heard on the right as well as a significant bruit on the left. Extremities without ischemic changes, no Gangrene , no cellulitis; no open wounds;  Musculoskeletal: no muscle wasting or atrophy   Neurologic: A&O X 3; Appropriate Affect ; SENSATION: normal; MOTOR FUNCTION:  moving all extremities equally. Speech is fluent/normal  Non-Invasive Vascular Imaging CAROTID DUPLEX 10/08/2011  Right ICA 0ccluded stenosis Left ICA 20 - 39 % stenosis   ASSESSMENT/PLAN: 71 year-old female with mild dementia and history of CVA seen for carotid duplex followup which indicates a total occlusion of the right with no ICA flow detectable, on the left her peak systolic is 82 her end-diastolic is 33. She will return in one year for repeat carotid duplex and will be seen in my clinic. She will continue antiplatelet therapy until that time under the care of her primary care doctor.  Lauree Chandler ANP   Clinic MD: Darrick Penna

## 2011-10-19 NOTE — Procedures (Unsigned)
CAROTID DUPLEX EXAM  INDICATION:  Follow up carotid disease history with occlusion of right ICA with possible recanalization.  HISTORY: Diabetes:  Yes Cardiac:  No Hypertension:  Yes Smoking:  Previous Previous Surgery:  No CV History: Amaurosis Fugax: No, Paresthesias: No, Hemiparesis: No                                      RIGHT             LEFT Brachial systolic pressure:         180               180 Brachial Doppler waveforms:         WNL               WNL Vertebral direction of flow:        Antegrade         Antegrade DUPLEX VELOCITIES (cm/sec) CCA peak systolic                   39                59 ECA peak systolic                   110               148 ICA peak systolic                   0                 82 ICA end diastolic                   0                 33 PLAQUE MORPHOLOGY:                  Soft/heterogeneous                  Heterogeneous PLAQUE AMOUNT:                      100%              Mild PLAQUE LOCATION:                    ICA               ICA  IMPRESSION: 1. No detectable flow in the right ICA with significant soft plaque     observed at the origin. 2. Right CCA is highly resistant. 3. Left ICA is 1% to 39% stenosed. 4. Bilateral vertebral arteries are within normal limits.  ___________________________________________ Janetta Hora Fields, MD  LT/MEDQ  D:  10/08/2011  T:  10/08/2011  Job:  [id number]

## 2012-09-29 ENCOUNTER — Other Ambulatory Visit: Payer: Self-pay | Admitting: *Deleted

## 2012-10-13 ENCOUNTER — Other Ambulatory Visit: Payer: Medicare Other

## 2012-10-13 ENCOUNTER — Ambulatory Visit: Payer: Medicare Other | Admitting: Vascular Surgery

## 2012-11-10 ENCOUNTER — Emergency Department: Payer: Self-pay | Admitting: Emergency Medicine

## 2012-11-10 LAB — CBC
HCT: 40 % (ref 35.0–47.0)
HGB: 12.9 g/dL (ref 12.0–16.0)
MCV: 84 fL (ref 80–100)
RBC: 4.79 10*6/uL (ref 3.80–5.20)
RDW: 14.5 % (ref 11.5–14.5)
WBC: 5.7 10*3/uL (ref 3.6–11.0)

## 2012-11-10 LAB — URINALYSIS, COMPLETE
Bilirubin,UR: NEGATIVE
Glucose,UR: >=500 mg/dL (ref 0–75)
Nitrite: NEGATIVE
Ph: 6 (ref 4.5–8.0)
Protein: NEGATIVE
Specific Gravity: 1.013 (ref 1.003–1.030)
Squamous Epithelial: 2
WBC UR: 8 /HPF (ref 0–5)

## 2012-11-10 LAB — COMPREHENSIVE METABOLIC PANEL
Albumin: 3.3 g/dL — ABNORMAL LOW (ref 3.4–5.0)
Alkaline Phosphatase: 134 U/L (ref 50–136)
Anion Gap: 4 — ABNORMAL LOW (ref 7–16)
Co2: 28 mmol/L (ref 21–32)
EGFR (African American): >60
EGFR (Non-African Amer.): >60
Glucose: 169 mg/dL — ABNORMAL HIGH (ref 65–99)
Potassium: 4.2 mmol/L (ref 3.5–5.1)

## 2012-11-10 LAB — TROPONIN I: Troponin-I: <0.02 ng/mL

## 2013-04-05 ENCOUNTER — Ambulatory Visit: Payer: Self-pay | Admitting: Internal Medicine

## 2013-04-11 ENCOUNTER — Ambulatory Visit: Payer: Self-pay | Admitting: Internal Medicine

## 2013-05-18 ENCOUNTER — Inpatient Hospital Stay: Payer: Self-pay | Admitting: Internal Medicine

## 2013-05-18 LAB — URINALYSIS, COMPLETE
Blood: NEGATIVE
Glucose,UR: >=500 mg/dL (ref 0–75)
Ph: 5 (ref 4.5–8.0)

## 2013-05-18 LAB — CBC WITH DIFFERENTIAL/PLATELET
Eosinophil %: 1 %
HCT: 42.4 % (ref 35.0–47.0)
MCH: 27.1 pg (ref 26.0–34.0)
MCV: 82 fL (ref 80–100)
Monocyte %: 8.9 %
Neutrophil %: 73.8 %
Platelet: 177 10*3/uL (ref 150–440)
RDW: 14.5 % (ref 11.5–14.5)
WBC: 6.5 10*3/uL (ref 3.6–11.0)

## 2013-05-18 LAB — BASIC METABOLIC PANEL
Calcium, Total: 9 mg/dL (ref 8.5–10.1)
EGFR (African American): 42 — ABNORMAL LOW
Glucose: 447 mg/dL — ABNORMAL HIGH (ref 65–99)
Potassium: 3.9 mmol/L (ref 3.5–5.1)

## 2013-05-18 LAB — HEMOGLOBIN A1C: Hemoglobin A1C: 9.3 % — ABNORMAL HIGH (ref 4.2–6.3)

## 2013-05-19 DIAGNOSIS — I359 Nonrheumatic aortic valve disorder, unspecified: Secondary | ICD-10-CM

## 2013-05-19 LAB — BASIC METABOLIC PANEL
Calcium, Total: 8.5 mg/dL (ref 8.5–10.1)
Co2: 27 mmol/L (ref 21–32)
Sodium: 141 mmol/L (ref 136–145)

## 2013-05-19 LAB — CBC WITH DIFFERENTIAL/PLATELET
Eosinophil #: 0.2 10*3/uL (ref 0.0–0.7)
Eosinophil %: 3 %
HCT: 36.8 % (ref 35.0–47.0)
Lymphocyte %: 25.9 %
MCH: 27 pg (ref 26.0–34.0)
MCV: 81 fL (ref 80–100)
Monocyte %: 11.5 %
Neutrophil #: 3.3 10*3/uL (ref 1.4–6.5)
Platelet: 165 10*3/uL (ref 150–440)
RDW: 14.5 % (ref 11.5–14.5)

## 2013-05-19 LAB — LIPID PANEL
Cholesterol: 124 mg/dL (ref 0–200)
HDL Cholesterol: 51 mg/dL (ref 40–60)
Ldl Cholesterol, Calc: 59 mg/dL (ref 0–100)

## 2013-05-20 LAB — URINE CULTURE

## 2013-05-22 LAB — PLATELET COUNT: Platelet: 196 10*3/uL (ref 150–440)

## 2013-11-15 ENCOUNTER — Emergency Department: Payer: Self-pay | Admitting: Emergency Medicine

## 2013-11-15 LAB — CBC WITH DIFFERENTIAL/PLATELET
BASOS PCT: 1.1 %
Basophil #: 0.1 10*3/uL (ref 0.0–0.1)
Eosinophil #: 0.1 10*3/uL (ref 0.0–0.7)
Eosinophil %: 2.2 %
HCT: 42.1 % (ref 35.0–47.0)
HGB: 13.3 g/dL (ref 12.0–16.0)
LYMPHS PCT: 27.8 %
Lymphocyte #: 1.8 10*3/uL (ref 1.0–3.6)
MCH: 28.5 pg (ref 26.0–34.0)
MCHC: 31.6 g/dL — ABNORMAL LOW (ref 32.0–36.0)
MCV: 90 fL (ref 80–100)
MONO ABS: 0.8 x10 3/mm (ref 0.2–0.9)
MONOS PCT: 11.9 %
NEUTROS ABS: 3.7 10*3/uL (ref 1.4–6.5)
NEUTROS PCT: 57 %
PLATELETS: 172 10*3/uL (ref 150–440)
RBC: 4.66 10*6/uL (ref 3.80–5.20)
RDW: 13.6 % (ref 11.5–14.5)
WBC: 6.5 10*3/uL (ref 3.6–11.0)

## 2013-11-15 LAB — COMPREHENSIVE METABOLIC PANEL
ALBUMIN: 3.5 g/dL (ref 3.4–5.0)
ANION GAP: 4 — AB (ref 7–16)
Alkaline Phosphatase: 116 U/L
BILIRUBIN TOTAL: 0.2 mg/dL (ref 0.2–1.0)
BUN: 34 mg/dL — AB (ref 7–18)
Calcium, Total: 8.8 mg/dL (ref 8.5–10.1)
Chloride: 106 mmol/L (ref 98–107)
Co2: 32 mmol/L (ref 21–32)
Creatinine: 1.56 mg/dL — ABNORMAL HIGH (ref 0.60–1.30)
EGFR (Non-African Amer.): 33 — ABNORMAL LOW
GFR CALC AF AMER: 38 — AB
Glucose: 223 mg/dL — ABNORMAL HIGH (ref 65–99)
OSMOLALITY: 298 (ref 275–301)
Potassium: 4.4 mmol/L (ref 3.5–5.1)
SGOT(AST): 25 U/L (ref 15–37)
SGPT (ALT): 26 U/L (ref 12–78)
Sodium: 142 mmol/L (ref 136–145)
Total Protein: 7.5 g/dL (ref 6.4–8.2)

## 2013-11-15 LAB — TROPONIN I

## 2013-11-15 LAB — LIPASE, BLOOD: Lipase: 123 U/L (ref 73–393)

## 2013-11-16 LAB — URINALYSIS, COMPLETE
BACTERIA: NONE SEEN
BILIRUBIN, UR: NEGATIVE
BLOOD: NEGATIVE
Glucose,UR: 150 mg/dL (ref 0–75)
KETONE: NEGATIVE
LEUKOCYTE ESTERASE: NEGATIVE
Nitrite: NEGATIVE
PH: 5 (ref 4.5–8.0)
PROTEIN: NEGATIVE
RBC, UR: NONE SEEN /HPF (ref 0–5)
SPECIFIC GRAVITY: 1.019 (ref 1.003–1.030)
Squamous Epithelial: 1
WBC UR: 1 /HPF (ref 0–5)

## 2014-01-03 IMAGING — US US CAROTID DUPLEX BILAT
1 series · 14 of 24 positions shown · non-contrast
Comparison: none

REASON FOR EXAM: CVA/TIA
COMMENTS:

[Series 1: us carotid duplex bilat · 0.07mm/px · 14 of 72 slices shown]
[im 1/72]
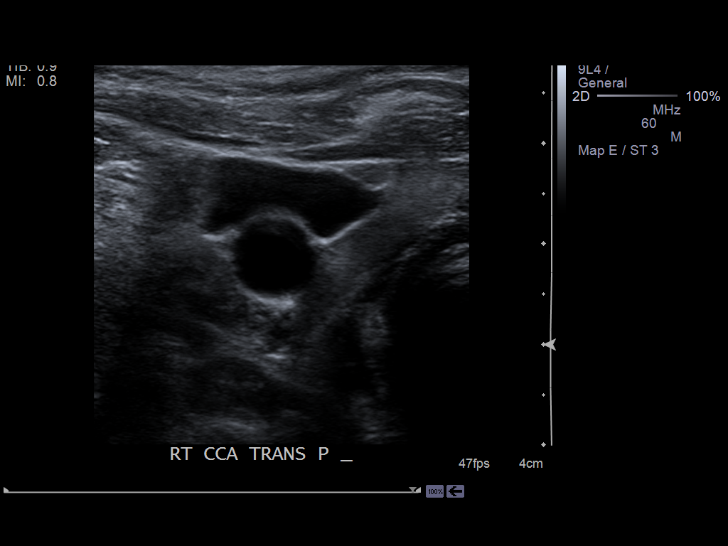
[im 7/72]
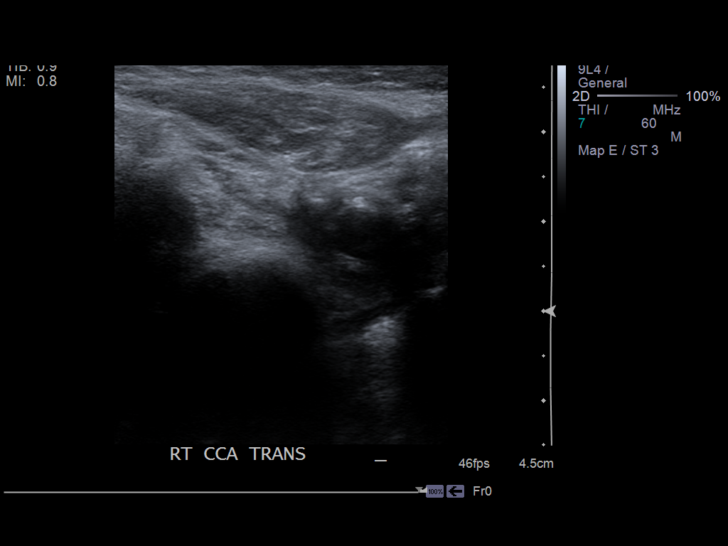
[im 13/72]
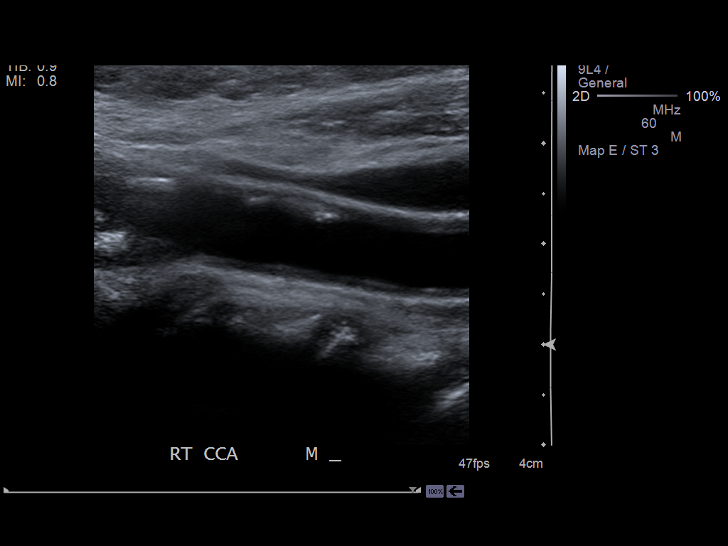
[im 19/72]
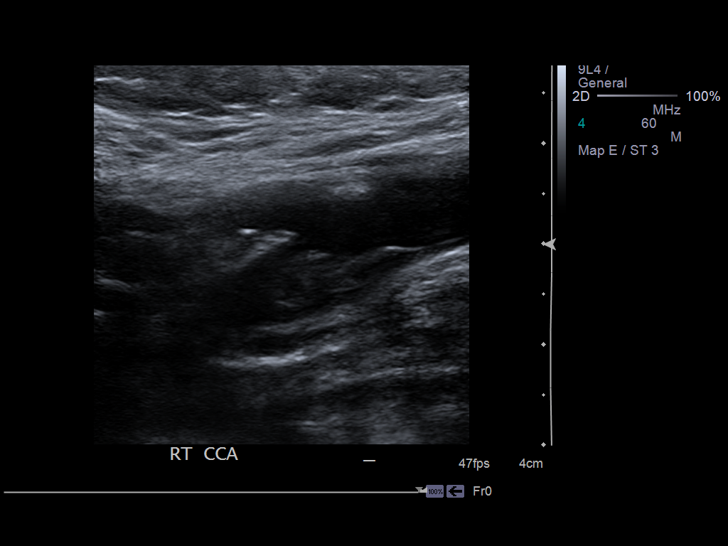
[im 22/72]
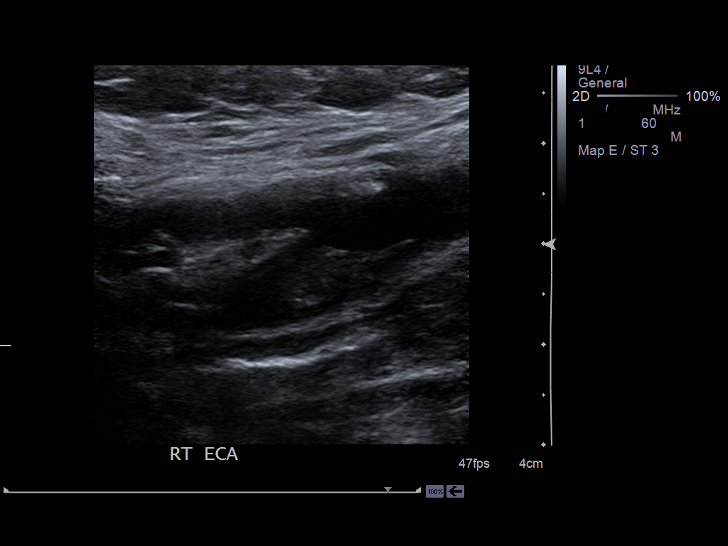
[im 28/72]
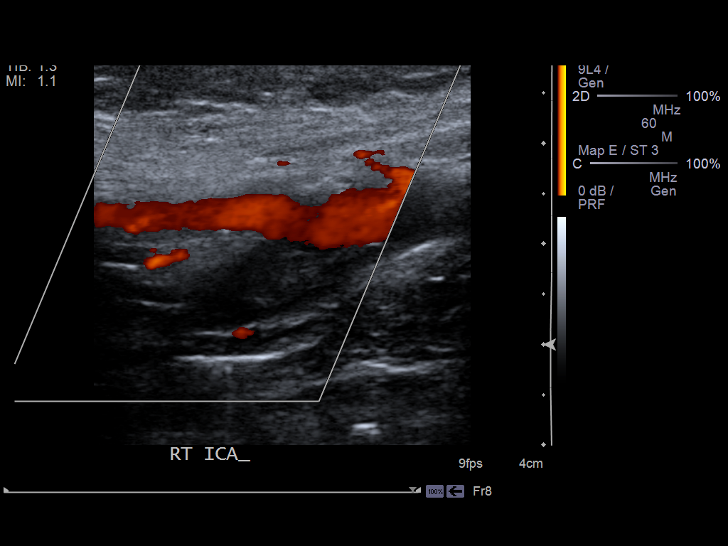
[im 34/72]
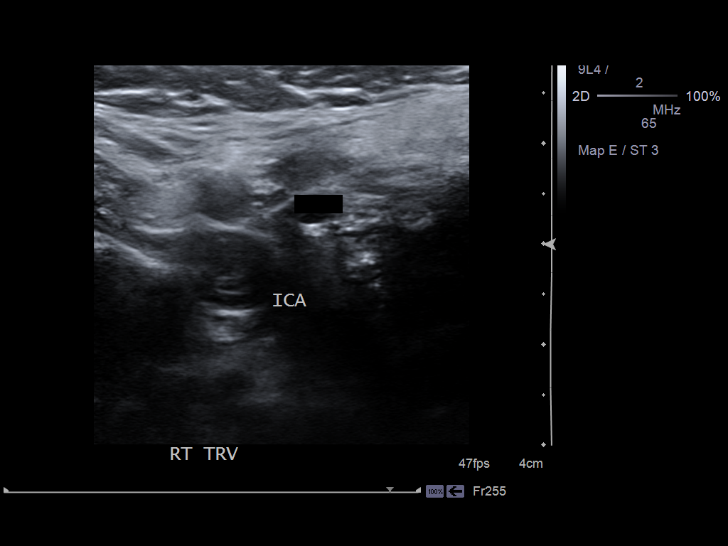
[im 38/72]
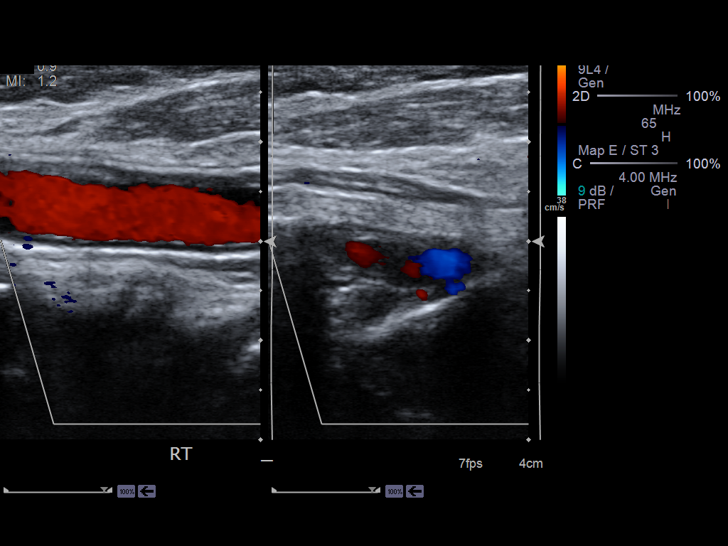
[im 44/72]
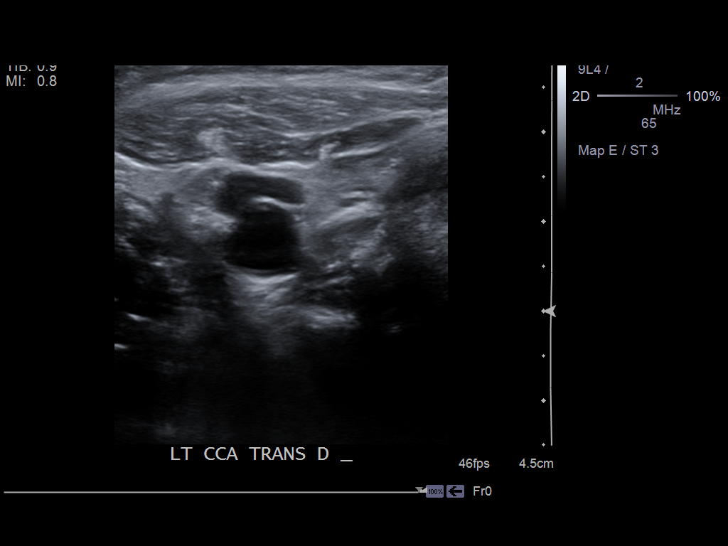
[im 50/72]
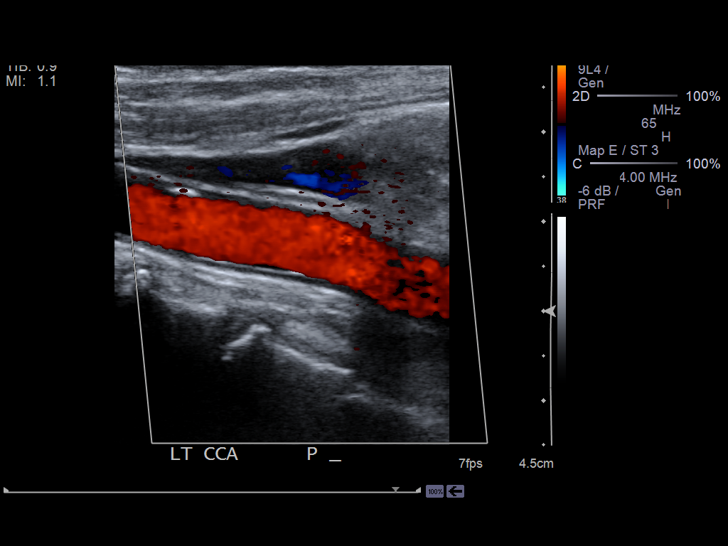
[im 56/72]
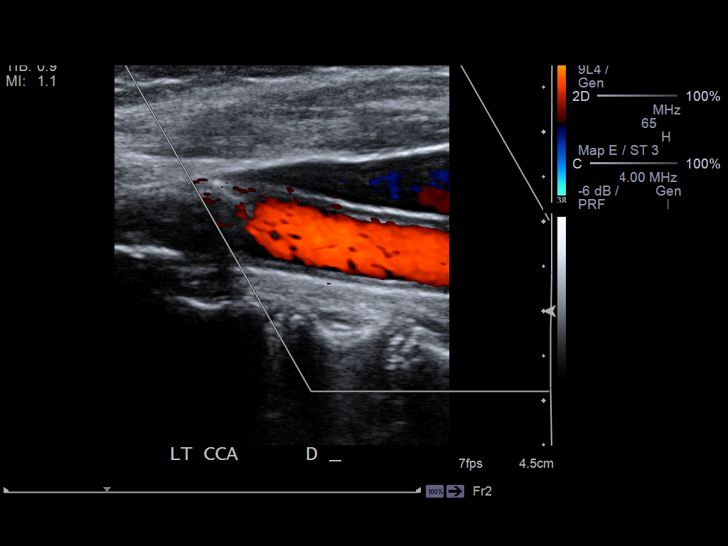
[im 59/72]
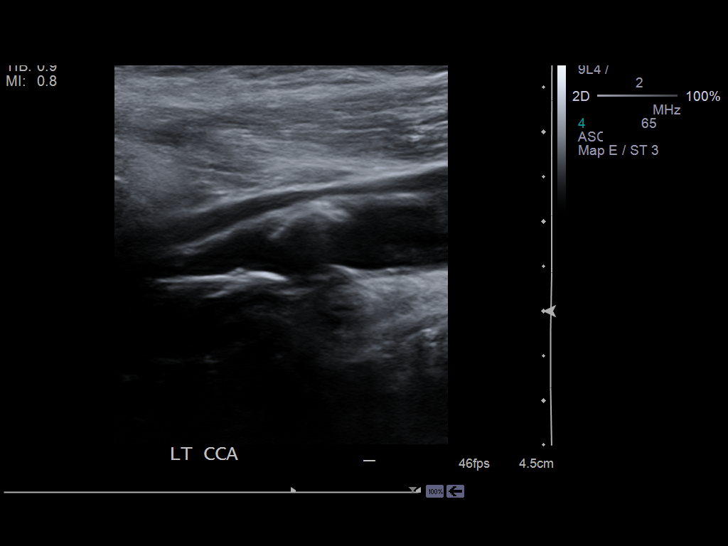
[im 65/72]
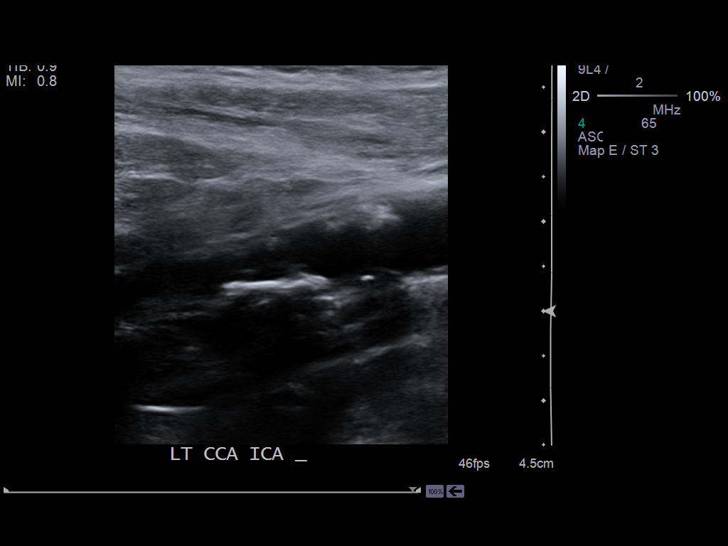
[im 72/72]
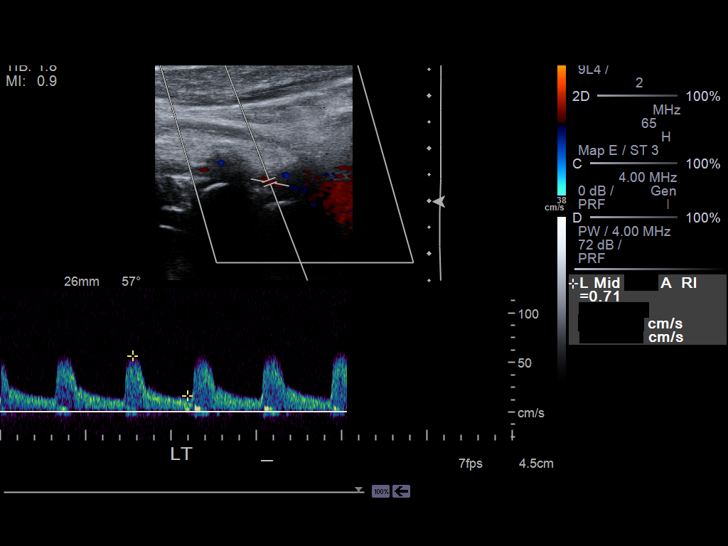

[14 of 24 positions shown; findings below may reference images not displayed]

PROCEDURE:     US  - US CAROTID DOPPLER BILATERAL  - May 18, 2013  [DATE]

RESULT:     Carotid Doppler interrogation is performed. The study
demonstrates antegrade flow in the right common carotid artery to the
carotid bifurcation and continuing into the external carotid with complete
occlusion demonstrated in the right internal carotid origin. The left
carotid system shows prominent plaque in the left carotid bulb extending
into the left internal carotid which appears to be less than 50%. The peak
systolic velocities on the right are not useful given the occlusion of the
right internal carotid. The left carotid peak systolic velocities are normal
with an internal to common carotid peak systolic velocity ratio of 1.15.
There is antegrade flow in both vertebral arteries without evidence of flow
reversal.
IMPRESSION: 1. Complete occlusion of the right internal carotid artery. Atherosclerotic
disease in the left carotid system without a high degree of stenosis. Close
monitoring and followup is recommended given the occlusion on the right.
Antegrade flow seen in both vertebrals without flow reversal.

[REDACTED]

## 2014-08-18 ENCOUNTER — Emergency Department: Payer: Self-pay | Admitting: Student

## 2014-08-18 LAB — BASIC METABOLIC PANEL
ANION GAP: 5 — AB (ref 7–16)
BUN: 22 mg/dL — ABNORMAL HIGH (ref 7–18)
CALCIUM: 8.5 mg/dL (ref 8.5–10.1)
CHLORIDE: 107 mmol/L (ref 98–107)
Co2: 29 mmol/L (ref 21–32)
Creatinine: 1.33 mg/dL — ABNORMAL HIGH (ref 0.60–1.30)
EGFR (African American): 50 — ABNORMAL LOW
EGFR (Non-African Amer.): 41 — ABNORMAL LOW
Glucose: 184 mg/dL — ABNORMAL HIGH (ref 65–99)
OSMOLALITY: 289 (ref 275–301)
POTASSIUM: 3.8 mmol/L (ref 3.5–5.1)
Sodium: 141 mmol/L (ref 136–145)

## 2014-08-18 LAB — CBC
HCT: 38.1 % (ref 35.0–47.0)
HGB: 12.3 g/dL (ref 12.0–16.0)
MCH: 28.5 pg (ref 26.0–34.0)
MCHC: 32.2 g/dL (ref 32.0–36.0)
MCV: 88 fL (ref 80–100)
Platelet: 169 10*3/uL (ref 150–440)
RBC: 4.31 10*6/uL (ref 3.80–5.20)
RDW: 15.5 % — ABNORMAL HIGH (ref 11.5–14.5)
WBC: 13.4 10*3/uL — ABNORMAL HIGH (ref 3.6–11.0)

## 2014-09-26 ENCOUNTER — Emergency Department: Payer: Self-pay | Admitting: Emergency Medicine

## 2014-11-09 NOTE — H&P (Signed)
PATIENT NAME:  [name] [name], [name] [name] MR#:  [id number] DATE OF BIRTH:  [birth year]  DATE OF ADMISSION:  05/18/2013  PRIMARY CARE PHYSICIAN: Dr. Juluis RainierElizabeth Barnes.   CHIEF COMPLAINT: Headache, blurry vision and altered mental status.   HISTORY OF PRESENT ILLNESS: This is a 74 year old female who resides at [facility]. Presents to the hospital due to having difficulty seeing out of her right eye along with persistent headache and being more confused than usual. The patient says that she has had a headache now for almost a few months, but the blurry vision has gotten worse in the past few days. According to the daughter, the patient also seems more confused than usual. The patient's daughter called EMS. When EMS arrived, the patient was noted to be significantly hypertensive with systolic blood pressures in the 170s. She was also noted to be severely hyperglycemic with blood sugars over 400. She was brought to the ER for further evaluation. The patient was noted to have a urinary tract infection, and her CT scan of the head also showed a possible subacute CVA in the left cerebellar hemisphere. Hospitalist service was contacted for further treatment and evaluation.   REVIEW OF SYSTEMS:  CONSTITUTIONAL: No documented fever. No weight gain. No weight loss.  EYES: Positive blurry double vision.  ENT: No tinnitus. No postnasal drip. No redness of the oropharynx.  RESPIRATORY: No cough. No wheeze. No hemoptysis. No dyspnea.  CARDIOVASCULAR: No chest pain. No orthopnea. No palpitations. No syncope.  GASTROINTESTINAL: No nausea. No vomiting. No diarrhea. No abdominal pain. No melena or hematochezia.  GENITOURINARY: No dysuria. No hematuria.  ENDOCRINE: No polyuria or nocturia. No heat or cold intolerance.  HEMATOLOGIC: No anemia. No bruising. No bleeding.  INTEGUMENTARY: No rashes. No lesions.  MUSCULOSKELETAL: No arthritis. No swelling. No gout.  NEUROLOGIC: No numbness or tingling. No ataxia.  No seizure-type activity.  PSYCHIATRIC: No anxiety. No insomnia. No ADD.   PAST MEDICAL HISTORY: Consistent with diabetes, hypertension, hyperlipidemia, history of previous CVA, diabetic neuropathy, GERD.   ALLERGIES: ALLEGRA which causes swelling.   SOCIAL HISTORY: Used to be a smoker, quit many, many years ago. No alcohol abuse. No illicit drug abuse. [facility].   FAMILY HISTORY: Both mother and father are deceased. Both mother and father had diabetes and high blood pressure.   CURRENT MEDICATIONS: As follows: Neurontin 300 mg at bedtime, Lantus 18 units at bedtime, lovastatin 20 mg daily, Reglan 5 mg q.i.d., metoprolol tartrate 12.5 mg b.i.d., NovoLog Flex Pen 8 units with breakfast, 6 units with lunch and 8 units with dinner, omeprazole 40 mg daily.   PHYSICAL EXAMINATION: Presently is as follows:  VITAL SIGNS: Temperature is 98.5, pulse 79, respirations 18, blood pressure 148/71, sat is 100% on room air.  GENERAL: She is a pleasant-appearing female in no apparent distress.  HEENT: Atraumatic, normocephalic. Her extraocular muscles are intact. Her pupils are equal and reactive to light. Sclerae anicteric. No conjunctival injection. No pharyngeal erythema.  NECK: Supple. There is no jugular venous distention. No bruits. No lymphadenopathy or thyromegaly.  HEART: Regular rate and rhythm. No murmurs. No rubs. No clicks.  LUNGS: Clear to auscultation bilaterally. No rales or rhonchi. No wheezes.  ABDOMEN: Soft, flat, nontender, nondistended. Has good bowel sounds. No hepatosplenomegaly appreciated.  EXTREMITIES: No evidence of any cyanosis, clubbing or peripheral edema. Has +2 pedal and radial pulses bilaterally.  NEUROLOGICAL: The patient is alert, awake and oriented x 3. No focal motor or sensory deficits  appreciated bilaterally.  SKIN: Moist and warm with no rashes appreciated.  LYMPHATIC: There is no cervical or axillary lymphadenopathy.    LABORATORY EXAM: Shows serum glucose of 447, BUN 20, creatinine 1.4, sodium 133, potassium 3.9, chloride 101, bicarb 26. White cell count 6.5, hemoglobin 14, hematocrit 42.4, platelet count 177. Urinalysis shows trace leukocyte esterase with 21 white cells and 2+ bacteria. The patient also had a CT of the head done without contrast which showed changes of atrophy and chronic microvascular ischemic disease and old-appearing cerebral infarcts. Possible subacute infarct in the left cerebral hemisphere. Old small left occipital infarct.   ASSESSMENT AND PLAN: This is a 74 year old female with a history of diabetes, hypertension, hyperlipidemia, history of previous cerebrovascular accident, diabetic neuropathy and gastroesophageal reflux disease who presents to the hospital with blurry vision, headache and also altered mental status and a CT showing a subacute cerebellar cerebrovascular accident.   1. Altered mental status with headache and blurry vision: The exact etiology of this is unclear. Suspicious for stroke as her CT head shows a subacute cerebellar stroke, but it does not go along with her blurry vision. She did have an old occipital infarct. Also wondering if her mental status changes are related to metabolic encephalopathy from her urinary tract infection. For now, will continue aspirin and statin for the stroke. Will get an MRI of her brain. Get a carotid duplex and a 2-dimensional echocardiogram. Follow q.4 hour neurologic checks. Will get a physical therapy consult also to assess her mobility.  2. Urinary tract infection: I will give her intravenous ceftriaxone. Follow urine cultures.  3. Diabetes: The patient is noncompliant with her medications. Continue Lantus and sliding scale insulin for now. Check a hemoglobin A1c.  4. Hyperlipidemia: Continue lovastatin.  5. Gastroesophageal reflux disease: Continue omeprazole.  6. Diabetic neuropathy: Continue Neurontin.   As per the daughter and the  family, the patient probably needs a higher level of care. She currently resides at [facility] independent living. I will get case management involved for discharge planning and get a physical therapy consult to assess her mobility. The patient is a FULL CODE.   TIME SPENT: 50 minutes.   ____________________________ Rolly Pancake. Cherlynn Kaiser, MD vjs:gb D: 05/18/2013 18:17:20 ET T: 05/18/2013 18:36:23 ET JOB#: [id number]  cc: Rolly Pancake. Cherlynn Kaiser, MD, <Dictator> Houston Siren MD ELECTRONICALLY SIGNED 05/19/2013 14:36

## 2014-11-09 NOTE — Discharge Summary (Signed)
PATIENT NAME:  [name] [name], [name] [name] MR#:  [id number] DATE OF BIRTH:  February 13, 1941  DATE OF ADMISSION:  05/18/2013 DATE OF DISCHARGE:  05/23/2013    DISPOSITION: The patient is being discharged to a skilled rehab facility.   PRESENTING COMPLAINT: Unsteady gait and right eye vision loss.   DISCHARGE DIAGNOSES:  1. Altered mental status with headache and blurred vision due to multifocal subacute bilateral cerebellar cerebrovascular accident and right occipital subacute cerebrovascular accident, improved.  2. Escherichia coli urinary tract infection.  3. Type 2 diabetes.  4. Hyperlipidemia.  5. Diabetic neuropathy.  6. Gastroesophageal reflux disease.  7. Hypertension.   CONDITION ON DISCHARGE: Fair. Vitals stable. Blood pressure 128/74, saturation is 98% on room air, pulse is 74.   CODE STATUS: Full code.   LABORATORY DATA AT DISCHARGE: CBC within normal limits. Basic metabolic panel within normal limits except potassium of 3.3. Lipid profile within normal limits.   RADIOLOGICAL STUDIES:  CT of the head shows changes of atrophy with chronic microvascular ischemic disease, appearing old cerebral infarct.  Ultrasound carotid Doppler: Complete occlusion of the right internal carotid artery. Atherosclerotic disease of the left system without a high degree of stenosis. It is about 50%. MRI of the brain shows multifocal subacute ischemic changes in bilateral cerebellar hemisphere and right occipital region. Changes of atrophy noted.  Echo Doppler showed EF of 55% to 60%. Normal global left ventricular function. Trivial pericardial effusion, mild mitral valve regurgitation, mild aortic valve stenosis, impaired relaxation of LV diastolic filling.   MEDICATIONS AT DISCHARGE: 1. Tylenol 650 p.o. q.4 p.r.n.  2. Aspirin 325 p.o. daily.  3. Mevacor 20 mg with supper.  4. Prilosec 40 mg daily.  5. Neurontin 300 mg at bedtime.  6. Sliding scale insulin.  7. Amlodipine 10 mg daily.  8. Lopressor 25 mg  b.i.d.  9. Insulin aspart 8 units subcutaneous a.c. supper.  10. Insulin detemir 25 units q.24.  11. Keflex 250 b.i.d. for 3 more days for urinary tract infection.   CONSULTATION: None.  PROCEDURE: None.   BRIEF SUMMARY OF HOSPITAL COURSE: [name] [name] is a 74 year old Caucasian female with history of hypertension, diabetes, hyperlipidemia and history of previous CVA in the past along with diabetic neuropathy, who comes in from [facility] with blurred vision, headache and altered mental status. She was admitted with:   1. Multifocal subacute bilateral cerebellar cerebrovascular accident and right occipital subacute cerebrovascular accident. The patient had some altered mental status changes; however, her mentation has improved. She was started on aspirin, and her statins were continued for acute stroke. MRI of the brain results as above were noted. Cardiac Doppler showed complete right ICA stenosis which is known and left ICA 50% stenosis on previous carotid ultrasound. Echo showed EF of 60%. Physical therapy saw the patient and recommends rehab. The patient does not have much neuro deficits. Her right eye vision improved remarkably.  2. Escherichia coli urinary tract infection. The patient will complete a course with Keflex.  3. Diabetes. The patient was continued on insulin detemir and sliding scale. Her A1c is 9. 4. Hyperlipidemia. Continue lovastatin.  5. Gastroesophageal reflux disease. Continue omeprazole.  6. Diabetic neuropathy, on Neurontin.   7. Hospital stay otherwise remained stable.   CODE STATUS: The patient remains a full code.   The patient will be discharged to rehab upon bed availability.    TIME SPENT: 40 minutes.   ____________________________ Wylie Hail Allena Katz, MD sap:lb D: 05/23/2013 13:41:27 ET T: 05/23/2013 13:48:43 ET JOB#:  [id number]  cc: Lyndy Russman A. Allena KatzPatel, MD, <Dictator> Juluis RainierElizabeth Barnes, MD Willow OraSONA A Lorynn Moeser MD ELECTRONICALLY SIGNED 05/25/2013  13:32

## 2014-12-10 ENCOUNTER — Encounter: Payer: Self-pay | Admitting: *Deleted

## 2014-12-10 ENCOUNTER — Other Ambulatory Visit: Payer: Self-pay

## 2014-12-10 ENCOUNTER — Emergency Department
Admission: EM | Admit: 2014-12-10 | Discharge: 2014-12-10 | Disposition: A | Discharge status: Home / Self Care | Payer: Medicare Other | Attending: Emergency Medicine | Admitting: Emergency Medicine

## 2014-12-10 DIAGNOSIS — Z7982 Long term (current) use of aspirin: Secondary | ICD-10-CM | POA: Diagnosis not present

## 2014-12-10 DIAGNOSIS — Z87891 Personal history of nicotine dependence: Secondary | ICD-10-CM | POA: Diagnosis not present

## 2014-12-10 DIAGNOSIS — Z79899 Other long term (current) drug therapy: Secondary | ICD-10-CM | POA: Insufficient documentation

## 2014-12-10 DIAGNOSIS — E119 Type 2 diabetes mellitus without complications: Secondary | ICD-10-CM | POA: Diagnosis not present

## 2014-12-10 DIAGNOSIS — Z794 Long term (current) use of insulin: Secondary | ICD-10-CM | POA: Diagnosis not present

## 2014-12-10 DIAGNOSIS — N189 Chronic kidney disease, unspecified: Secondary | ICD-10-CM | POA: Diagnosis not present

## 2014-12-10 DIAGNOSIS — I129 Hypertensive chronic kidney disease with stage 1 through stage 4 chronic kidney disease, or unspecified chronic kidney disease: Secondary | ICD-10-CM | POA: Diagnosis not present

## 2014-12-10 DIAGNOSIS — R42 Dizziness and giddiness: Secondary | ICD-10-CM

## 2014-12-10 LAB — BASIC METABOLIC PANEL
Anion gap: 7 (ref 5–15)
BUN: 21 mg/dL — AB (ref 6–20)
CALCIUM: 8.6 mg/dL — AB (ref 8.9–10.3)
CO2: 30 mmol/L (ref 22–32)
CREATININE: 1.33 mg/dL — AB (ref 0.44–1.00)
Chloride: 104 mmol/L (ref 101–111)
GFR, EST AFRICAN AMERICAN: 44 mL/min — AB (ref >60)
GFR, EST NON AFRICAN AMERICAN: 38 mL/min — AB (ref >60)
GLUCOSE: 248 mg/dL — AB (ref 65–99)
Potassium: 4.4 mmol/L (ref 3.5–5.1)
Sodium: 141 mmol/L (ref 135–145)

## 2014-12-10 LAB — URINALYSIS COMPLETE WITH MICROSCOPIC (ARMC ONLY)
Bilirubin Urine: NEGATIVE
GLUCOSE, UA: 50 mg/dL — AB
Hgb urine dipstick: NEGATIVE
KETONES UR: NEGATIVE mg/dL
Leukocytes, UA: NEGATIVE
NITRITE: NEGATIVE
Protein, ur: NEGATIVE mg/dL
SPECIFIC GRAVITY, URINE: 1.006 (ref 1.005–1.030)
pH: 7 (ref 5.0–8.0)

## 2014-12-10 LAB — CBC WITH DIFFERENTIAL/PLATELET
BASOS ABS: 0.1 10*3/uL (ref 0–0.1)
Basophils Relative: 1 %
EOS PCT: 3 %
Eosinophils Absolute: 0.2 10*3/uL (ref 0–0.7)
HCT: 37.6 % (ref 35.0–47.0)
Hemoglobin: 12 g/dL (ref 12.0–16.0)
Lymphocytes Relative: 27 %
Lymphs Abs: 1.9 10*3/uL (ref 1.0–3.6)
MCH: 28.4 pg (ref 26.0–34.0)
MCHC: 32 g/dL (ref 32.0–36.0)
MCV: 88.9 fL (ref 80.0–100.0)
MONOS PCT: 13 %
Monocytes Absolute: 0.9 10*3/uL (ref 0.2–0.9)
Neutro Abs: 4 10*3/uL (ref 1.4–6.5)
Neutrophils Relative %: 56 %
Platelets: 163 10*3/uL (ref 150–440)
RBC: 4.22 MIL/uL (ref 3.80–5.20)
RDW: 15.4 % — AB (ref 11.5–14.5)
WBC: 7.1 10*3/uL (ref 3.6–11.0)

## 2014-12-10 LAB — TROPONIN I

## 2014-12-10 MED ORDER — MECLIZINE HCL 25 MG PO TABS: 25.0000 mg | ORAL_TABLET | Freq: Three times a day (TID) | ORAL | Status: DC | PRN

## 2014-12-10 MED ORDER — MECLIZINE HCL 25 MG PO TABS
25.0000 mg | ORAL_TABLET | Freq: Once | ORAL | Status: AC
  Administered 2014-12-10: 25 mg via ORAL

## 2014-12-10 MED ORDER — MECLIZINE HCL 25 MG PO TABS
ORAL_TABLET | ORAL | Status: AC
  Administered 2014-12-10: 25 mg via ORAL
  Filled 2014-12-10: qty 1

## 2014-12-10 NOTE — ED Notes (Signed)
Called report to [facility] and stressed that pateint needs help getting up due to dizziness to prevent fall.  Pt dressed and ready to go.  Awaiting ems.

## 2014-12-10 NOTE — ED Provider Notes (Addendum)
[facility] Emergency Department Provider Note  ____________________________________________  Time seen: 2:05 PM on EMS arrival  I have reviewed the triage vital signs and the nursing notes.   HISTORY  Chief Complaint Dizziness    HPI [name] [name] is a 74 y.o. female who reports dizziness for 2 days. She notes that this morning she was trying to sit up in bed to watch TV and became very dizzy. Syncope or fall. No chest pain shortness of breath headache change in vision numbness tingling or weakness. Denies any urinary symptoms or abdominal pain. No vomiting or diarrhea. She's been eating and drinking normally recently.Patient notes that the dizziness occurs when she turns on her side or sits up.  On report from the patient's daughter, there is a concern that the patient's blood pressure has been low. No other reported symptoms   Past Medical History  Diagnosis Date  . Diabetes mellitus   . Hypertension   . CVA (cerebral infarction)   . GERD (gastroesophageal reflux disease)   . HLD (hyperlipidemia)   . Heart murmur   . Carotid artery occlusion 02/15/2009  . Stroke     Patient Active Problem List   Diagnosis Date Noted  . Occlusion and stenosis of carotid artery without mention of cerebral infarction 10/08/2011  . CKD (chronic kidney disease) 09/25/2011  . Nausea and vomiting 09/22/2011  . HTN (hypertension) 09/22/2011  . H/O: CVA (cardiovascular accident) 09/22/2011  . Diabetes mellitus, type 2 09/22/2011    Past Surgical History  Procedure Laterality Date  . Appendectomy    . Tonsillectomy      Current Outpatient Rx  Name  Route  Sig  Dispense  Refill  . allopurinol (ZYLOPRIM) 100 MG tablet   Oral   Take 100 mg by mouth daily.         Marland Kitchen amLODipine (NORVASC) 10 MG tablet   Oral   Take 10 mg by mouth daily.         Marland Kitchen aspirin 325 MG tablet   Oral   Take 325 mg by mouth daily.         . divalproex (DEPAKOTE) 250 MG DR  tablet   Oral   Take 250 mg by mouth 2 (two) times daily.         . furosemide (LASIX) 40 MG tablet   Oral   Take 40 mg by mouth daily.         Marland Kitchen gabapentin (NEURONTIN) 300 MG capsule   Oral   Take 300 mg by mouth at bedtime.         . insulin aspart (NOVOLOG) 100 UNIT/ML injection   Subcutaneous   Inject into the skin 3 (three) times daily before meals.         . insulin glargine (LANTUS) 100 UNIT/ML injection   Subcutaneous   Inject 16 Units into the skin daily.         . insulin NPH Human (HUMULIN N,NOVOLIN N) 100 UNIT/ML injection   Subcutaneous   Inject 12 Units into the skin daily before breakfast.         . levothyroxine (SYNTHROID, LEVOTHROID) 88 MCG tablet   Oral   Take 88 mcg by mouth daily before breakfast.         . metoCLOPramide (REGLAN) 5 MG tablet   Oral   Take 5 mg by mouth 4 (four) times daily.         . potassium citrate (UROCIT-K) 10 MEQ (1080 MG)  SR tablet   Oral   Take 10 mEq by mouth daily before breakfast.         . QUEtiapine (SEROQUEL) 25 MG tablet   Oral   Take 25 mg by mouth at bedtime.         Marland Kitchen acetaminophen (TYLENOL ARTHRITIS PAIN) 650 MG CR tablet   Oral   Take 650 mg by mouth every 8 (eight) hours as needed. For pain.         . Cholecalciferol (VITAMIN D3) 2000 UNITS TABS   Oral   Take 1 tablet by mouth daily.         . Glycerin, Adult, 2.1 G SUPP   Rectal   Place 1 suppository rectally daily as needed.         Marland Kitchen losartan (COZAAR) 50 MG tablet   Oral   Take 50 mg by mouth daily.           Marland Kitchen lovastatin (MEVACOR) 20 MG tablet   Oral   Take 20 mg by mouth daily.           . metoprolol tartrate (LOPRESSOR) 12.5 mg TABS   Oral   Take 0.5 tablets (12.5 mg total) by mouth 2 (two) times daily. Patient taking differently: Take 25 mg by mouth 2 (two) times daily.          Marland Kitchen omeprazole (PRILOSEC) 40 MG capsule   Oral   Take 40 mg by mouth daily.             Allergies Fexofenadine  hcl  Family History  Problem Relation Age of Onset  . Diabetes Mother   . Hypertension Mother   . Diabetes Father   . Hypertension Father   . Diabetes Sister   . Hypertension Sister     Social History History  Substance Use Topics  . Smoking status: Former Smoker    Types: Cigarettes    Quit date: 07/20/1985  . Smokeless tobacco: Never Used  . Alcohol Use: No    Review of Systems  Constitutional: No fever or chills. No weight changes Eyes:No blurry vision or double vision.  ENT: No sore throat. Cardiovascular: No chest pain. Respiratory: No dyspnea or cough. Gastrointestinal: Negative for abdominal pain, vomiting and diarrhea.  No BRBPR or melena. Genitourinary: Negative for dysuria, urinary retention, bloody urine, or difficulty urinating. Musculoskeletal: Negative for back pain. No joint swelling or pain. Skin: Negative for rash. Neurological: Negative for headaches, focal weakness or numbness. Positive dizziness described as room spinning worse with movement Psychiatric:No anxiety or depression.   Endocrine:No hot/cold intolerance, changes in energy, or sleep difficulty.  10-point ROS otherwise negative.  ____________________________________________   PHYSICAL EXAM:  VITAL SIGNS: ED Triage Vitals  Enc Vitals Group     BP 12/10/14 1407 170/107 mmHg     Pulse Rate 12/10/14 1407 80     Resp 12/10/14 1407 16     Temp 12/10/14 1407 98.7 F (37.1 C)     Temp Source 12/10/14 1407 Oral     SpO2 12/10/14 1407 100 %     Weight 12/10/14 1407 157 lb 10.1 oz (71.501 kg)     Height 12/10/14 1407  (1.448 m)     Head Cir --      Peak Flow --      Pain Score --      Pain Loc --      Pain Edu? --      Excl. in GC? --  Constitutional: Alert and oriented to person and place. Well appearing and in no distress. Eyes: No scleral icterus. No conjunctival pallor. PERRL. EOMI ENT   Head: Normocephalic and atraumatic.   Nose: No congestion/rhinnorhea. No  septal hematoma   Mouth/Throat: MMM, no pharyngeal erythema. No peritonsillar mass. No uvula shift.   Neck: No stridor. No SubQ emphysema. No meningismus. Hematological/Lymphatic/Immunilogical: No cervical lymphadenopathy. Cardiovascular: RRR. Normal and symmetric distal pulses are present in all extremities. No murmurs, rubs, or gallops. Respiratory: Normal respiratory effort without tachypnea nor retractions. Breath sounds are clear and equal bilaterally. No wheezes/rales/rhonchi. Gastrointestinal: Soft and nontender. No distention. There is no CVA tenderness.  No rebound, rigidity, or guarding. Genitourinary: deferred Musculoskeletal: Nontender with normal range of motion in all extremities. No joint effusions.  No lower extremity tenderness.  No edema. Neurologic:   Normal speech and language.  CN 2-10 normal. Motor grossly intact. No pronator drift.  Normal gait. No gross focal neurologic deficits are appreciated.  Skin:  Skin is warm, dry and intact. No rash noted.  No petechiae, purpura, or bullae. Psychiatric: Mood and affect are normal. Speech and behavior are normal. Patient exhibits appropriate insight and judgment.  ____________________________________________    LABS (pertinent positives/negatives) (all labs ordered are listed, but only abnormal results are displayed) Labs Reviewed  URINALYSIS COMPLETEWITH MICROSCOPIC ([facility])  - Abnormal; Notable for the following:    Color, Urine STRAW (*)    APPearance CLEAR (*)    Glucose, UA 50 (*)    Bacteria, UA RARE (*)    Squamous Epithelial / LPF 0-5 (*)    All other components within normal limits  BASIC METABOLIC PANEL - Abnormal; Notable for the following:    Glucose, Bld 248 (*)    BUN 21 (*)    Creatinine, Ser 1.33 (*)    Calcium 8.6 (*)    GFR calc non Af Amer 38 (*)    GFR calc Af Amer 44 (*)    All other components within normal limits  CBC WITH DIFFERENTIAL/PLATELET - Abnormal; Notable for the following:     RDW 15.4 (*)    All other components within normal limits  TROPONIN I   ____________________________________________   EKG  EKG interpreted by me  Date: 12/10/2014  Rate: 78  Rhythm: normal sinus rhythm  QRS Axis: normal  Intervals: normal  ST/T Wave abnormalities: normal  Conduction Disutrbances: none  Narrative Interpretation: unremarkable      ____________________________________________    RADIOLOGY    ____________________________________________   PROCEDURES  ____________________________________________   INITIAL IMPRESSION / ASSESSMENT AND PLAN / ED COURSE  Pertinent labs & imaging results that were available during my care of the patient were reviewed by me and considered in my medical decision making (see chart for details).  No acute distress. Orthostatic vital signs negative. Vital signs are unremarkable except for hypertension. No evidence of sepsis or pneumonia stroke ICH meningitis. I will check urinalysis and labs to evaluate the patient's dizziness. She is overall well appearing and I doubt significant medical illness at this time, so if her workup is unremarkable at the quick and discharge her home.  ----------------------------------------- 4:18 PM on 12/10/2014 -----------------------------------------  Workup is unremarkable. No UTI, electrolytes unremarkable. The patient is eating and feeling fine in the room. Vital signs remained stable. We'll discharge her home and have her follow up with her primary care for further monitoring of her symptoms.  ____________________________________________   FINAL CLINICAL IMPRESSION(S) / ED DIAGNOSES  Final diagnoses:  None  acute dizziness, initial encounter Benign positional vertigo   Sharman CheekPhillip Breckan Cafiero, MD 12/10/14 1619  Sharman CheekPhillip Ariadne Rissmiller, MD 12/10/14 [phone]

## 2014-12-10 NOTE — ED Notes (Signed)
Pt given crackers with peanut butter, applesauce, and milk. OK per Dr. Scotty CourtStafford.

## 2014-12-10 NOTE — Discharge Instructions (Signed)
Dizziness °Dizziness is a common problem. It is a feeling of unsteadiness or light-headedness. You may feel like you are about to faint. Dizziness can lead to injury if you stumble or fall. A person of any age group can suffer from dizziness, but dizziness is more common in older adults. °CAUSES  °Dizziness can be caused by many different things, including: °· Middle ear problems. °· Standing for too long. °· Infections. °· An allergic reaction. °· Aging. °· An emotional response to something, such as the sight of blood. °· Side effects of medicines. °· Tiredness. °· Problems with circulation or blood pressure. °· Excessive use of alcohol or medicines, or illegal drug use. °· Breathing too fast (hyperventilation). °· An irregular heart rhythm (arrhythmia). °· A low red blood cell count (anemia). °· Pregnancy. °· Vomiting, diarrhea, fever, or other illnesses that cause body fluid loss (dehydration). °· Diseases or conditions such as Parkinson's disease, high blood pressure (hypertension), diabetes, and thyroid problems. °· Exposure to extreme heat. °DIAGNOSIS  °Your health care provider will ask about your symptoms, perform a physical exam, and perform an electrocardiogram (ECG) to record the electrical activity of your heart. Your health care provider may also perform other heart or blood tests to determine the cause of your dizziness. These may include: °· Transthoracic echocardiogram (TTE). During echocardiography, sound waves are used to evaluate how blood flows through your heart. °· Transesophageal echocardiogram (TEE). °· Cardiac monitoring. This allows your health care provider to monitor your heart rate and rhythm in real time. °· Holter monitor. This is a portable device that records your heartbeat and can help diagnose heart arrhythmias. It allows your health care provider to track your heart activity for several days if needed. °· Stress tests by exercise or by giving medicine that makes the heart beat  faster. °TREATMENT  °Treatment of dizziness depends on the cause of your symptoms and can vary greatly. °HOME CARE INSTRUCTIONS  °· Drink enough fluids to keep your urine clear or pale yellow. This is especially important in very hot weather. In older adults, it is also important in cold weather. °· Take your medicine exactly as directed if your dizziness is caused by medicines. When taking blood pressure medicines, it is especially important to get up slowly. °¨ Rise slowly from chairs and steady yourself until you feel okay. °¨ In the morning, first sit up on the side of the bed. When you feel okay, stand slowly while holding onto something until you know your balance is fine. °· Move your legs often if you need to stand in one place for a long time. Tighten and relax your muscles in your legs while standing. °· Have someone stay with you for 1-2 days if dizziness continues to be a problem. Do this until you feel you are well enough to stay alone. Have the person call your health care provider if he or she notices changes in you that are concerning. °· Do not drive or use heavy machinery if you feel dizzy. °· Do not drink alcohol. °SEEK IMMEDIATE MEDICAL CARE IF:  °· Your dizziness or light-headedness gets worse. °· You feel nauseous or vomit. °· You have problems talking, walking, or using your arms, hands, or legs. °· You feel weak. °· You are not thinking clearly or you have trouble forming sentences. It may take a friend or family member to notice this. °· You have chest pain, abdominal pain, shortness of breath, or sweating. °· Your vision changes. °· You notice   any bleeding. °· You have side effects from medicine that seems to be getting worse rather than better. °MAKE SURE YOU:  °· Understand these instructions. °· Will watch your condition. °· Will get help right away if you are not doing well or get worse. °Document Released: 12/30/2000 Document Revised: 07/11/2013 Document Reviewed: 01/23/2011 °ExitCare®  Patient Information ©2015 [employer]. This information is not intended to replace advice given to you by your health care provider. Make sure you discuss any questions you have with your health care provider. ° °Benign Positional Vertigo °Vertigo means you feel like you or your surroundings are moving when they are not. Benign positional vertigo is the most common form of vertigo. Benign means that the cause of your condition is not serious. Benign positional vertigo is more common in older adults. °CAUSES  °Benign positional vertigo is the result of an upset in the labyrinth system. This is an area in the middle ear that helps control your balance. This may be caused by a viral infection, head injury, or repetitive motion. However, often no specific cause is found. °SYMPTOMS  °Symptoms of benign positional vertigo occur when you move your head or eyes in different directions. Some of the symptoms may include: °· Loss of balance and falls. °· Vomiting. °· Blurred vision. °· Dizziness. °· Nausea. °· Involuntary eye movements (nystagmus). °DIAGNOSIS  °Benign positional vertigo is usually diagnosed by physical exam. If the specific cause of your benign positional vertigo is unknown, your caregiver may perform imaging tests, such as magnetic resonance imaging (MRI) or computed tomography (CT). °TREATMENT  °Your caregiver may recommend movements or procedures to correct the benign positional vertigo. Medicines such as meclizine, benzodiazepines, and medicines for nausea may be used to treat your symptoms. In rare cases, if your symptoms are caused by certain conditions that affect the inner ear, you may need surgery. °HOME CARE INSTRUCTIONS  °· Follow your caregiver's instructions. °· Move slowly. Do not make sudden body or head movements. °· Avoid driving. °· Avoid operating heavy machinery. °· Avoid performing any tasks that would be dangerous to you or others during a vertigo episode. °· Drink enough fluids to keep  your urine clear or pale yellow. °SEEK IMMEDIATE MEDICAL CARE IF:  °· You develop problems with walking, weakness, numbness, or using your arms, hands, or legs. °· You have difficulty speaking. °· You develop severe headaches. °· Your nausea or vomiting continues or gets worse. °· You develop visual changes. °· Your family or friends notice any behavioral changes. °· Your condition gets worse. °· You have a fever. °· You develop a stiff neck or sensitivity to light. °MAKE SURE YOU:  °· Understand these instructions. °· Will watch your condition. °· Will get help right away if you are not doing well or get worse. °Document Released: 04/13/2006 Document Revised: 09/28/2011 Document Reviewed: 03/26/2011 °ExitCare® Patient Information ©2015 [employer]. This information is not intended to replace advice given to you by your health care provider. Make sure you discuss any questions you have with your health care provider. ° °

## 2014-12-10 NOTE — ED Notes (Signed)
Called pts daughter, Okey RegalCarol at [phone] to update that pt is at ED. Daughter reports [facility] reported to her that pts blood pressure had been low over the past 2 days and this was the reason for sending pt to ED.

## 2014-12-10 NOTE — ED Notes (Signed)
pts daughter called and reported being close to hospital. Daughter reports coming to ED and being willing to take pt back to [facility]. EMS cancelled and awaiting daughter to transport pt.

## 2014-12-10 NOTE — ED Notes (Signed)
Pt provided with food tray.

## 2014-12-10 NOTE — ED Notes (Signed)
Pt arrived via EMS from [facility] due to dizziness for the past 2 days. Pt reports increased dizziness with attempts to sit up or stand up. Pt attempted to sit up today, became dizzy and began calling out. Pt denies LOC or syncopal episodes. Denies falling. Pt alert and oriented x4

## 2015-05-24 ENCOUNTER — Emergency Department: Payer: Medicare Other

## 2015-05-24 ENCOUNTER — Inpatient Hospital Stay: Payer: Medicare Other

## 2015-05-24 ENCOUNTER — Inpatient Hospital Stay
Admit: 2015-05-24 | Discharge: 2015-05-24 | Disposition: A | Discharge status: Home / Self Care | Payer: Medicare Other | Attending: Internal Medicine | Admitting: Internal Medicine

## 2015-05-24 ENCOUNTER — Inpatient Hospital Stay
Admission: EM | Admit: 2015-05-24 | Discharge: 2015-05-31 | DRG: 871 | Disposition: A | Discharge status: Skilled Nursing Facility | Payer: Medicare Other | Attending: Internal Medicine | Admitting: Internal Medicine

## 2015-05-24 DIAGNOSIS — N3000 Acute cystitis without hematuria: Secondary | ICD-10-CM | POA: Diagnosis present

## 2015-05-24 DIAGNOSIS — R404 Transient alteration of awareness: Secondary | ICD-10-CM | POA: Diagnosis not present

## 2015-05-24 DIAGNOSIS — I129 Hypertensive chronic kidney disease with stage 1 through stage 4 chronic kidney disease, or unspecified chronic kidney disease: Secondary | ICD-10-CM | POA: Diagnosis not present

## 2015-05-24 DIAGNOSIS — K219 Gastro-esophageal reflux disease without esophagitis: Secondary | ICD-10-CM | POA: Diagnosis present

## 2015-05-24 DIAGNOSIS — E1151 Type 2 diabetes mellitus with diabetic peripheral angiopathy without gangrene: Secondary | ICD-10-CM | POA: Diagnosis present

## 2015-05-24 DIAGNOSIS — E1122 Type 2 diabetes mellitus with diabetic chronic kidney disease: Secondary | ICD-10-CM | POA: Diagnosis present

## 2015-05-24 DIAGNOSIS — B9620 Unspecified Escherichia coli [E. coli] as the cause of diseases classified elsewhere: Secondary | ICD-10-CM | POA: Diagnosis present

## 2015-05-24 DIAGNOSIS — F0280 Dementia in other diseases classified elsewhere without behavioral disturbance: Secondary | ICD-10-CM | POA: Diagnosis present

## 2015-05-24 DIAGNOSIS — H02401 Unspecified ptosis of right eyelid: Secondary | ICD-10-CM | POA: Diagnosis present

## 2015-05-24 DIAGNOSIS — F329 Major depressive disorder, single episode, unspecified: Secondary | ICD-10-CM | POA: Diagnosis present

## 2015-05-24 DIAGNOSIS — Z794 Long term (current) use of insulin: Secondary | ICD-10-CM

## 2015-05-24 DIAGNOSIS — N189 Chronic kidney disease, unspecified: Secondary | ICD-10-CM | POA: Diagnosis present

## 2015-05-24 DIAGNOSIS — I69351 Hemiplegia and hemiparesis following cerebral infarction affecting right dominant side: Secondary | ICD-10-CM

## 2015-05-24 DIAGNOSIS — E785 Hyperlipidemia, unspecified: Secondary | ICD-10-CM | POA: Diagnosis not present

## 2015-05-24 DIAGNOSIS — I214 Non-ST elevation (NSTEMI) myocardial infarction: Secondary | ICD-10-CM | POA: Diagnosis present

## 2015-05-24 DIAGNOSIS — Z7982 Long term (current) use of aspirin: Secondary | ICD-10-CM

## 2015-05-24 DIAGNOSIS — I160 Hypertensive urgency: Secondary | ICD-10-CM | POA: Diagnosis not present

## 2015-05-24 DIAGNOSIS — A4151 Sepsis due to Escherichia coli [E. coli]: Secondary | ICD-10-CM | POA: Diagnosis present

## 2015-05-24 DIAGNOSIS — N179 Acute kidney failure, unspecified: Secondary | ICD-10-CM | POA: Diagnosis present

## 2015-05-24 DIAGNOSIS — Z7984 Long term (current) use of oral hypoglycemic drugs: Secondary | ICD-10-CM

## 2015-05-24 DIAGNOSIS — A419 Sepsis, unspecified organism: Secondary | ICD-10-CM

## 2015-05-24 DIAGNOSIS — N390 Urinary tract infection, site not specified: Secondary | ICD-10-CM

## 2015-05-24 DIAGNOSIS — Z87891 Personal history of nicotine dependence: Secondary | ICD-10-CM | POA: Diagnosis not present

## 2015-05-24 DIAGNOSIS — G309 Alzheimer's disease, unspecified: Secondary | ICD-10-CM | POA: Diagnosis not present

## 2015-05-24 DIAGNOSIS — Z79899 Other long term (current) drug therapy: Secondary | ICD-10-CM | POA: Diagnosis not present

## 2015-05-24 DIAGNOSIS — G9341 Metabolic encephalopathy: Secondary | ICD-10-CM | POA: Diagnosis present

## 2015-05-24 DIAGNOSIS — Z23 Encounter for immunization: Secondary | ICD-10-CM | POA: Diagnosis not present

## 2015-05-24 DIAGNOSIS — I248 Other forms of acute ischemic heart disease: Secondary | ICD-10-CM | POA: Diagnosis present

## 2015-05-24 DIAGNOSIS — Z888 Allergy status to other drugs, medicaments and biological substances status: Secondary | ICD-10-CM | POA: Diagnosis not present

## 2015-05-24 DIAGNOSIS — I639 Cerebral infarction, unspecified: Secondary | ICD-10-CM

## 2015-05-24 DIAGNOSIS — R1110 Vomiting, unspecified: Secondary | ICD-10-CM

## 2015-05-24 DIAGNOSIS — R4182 Altered mental status, unspecified: Secondary | ICD-10-CM | POA: Diagnosis present

## 2015-05-24 DIAGNOSIS — R410 Disorientation, unspecified: Secondary | ICD-10-CM

## 2015-05-24 HISTORY — DX: Unspecified dementia, unspecified severity, without behavioral disturbance, psychotic disturbance, mood disturbance, and anxiety: F03.90

## 2015-05-24 LAB — CBC WITH DIFFERENTIAL/PLATELET
BASOS ABS: 0 10*3/uL (ref 0–0.1)
Basophils Relative: 1 %
EOS ABS: 0 10*3/uL (ref 0–0.7)
Eosinophils Relative: 0 %
HCT: 36.6 % (ref 35.0–47.0)
Hemoglobin: 11.7 g/dL — ABNORMAL LOW (ref 12.0–16.0)
Lymphocytes Relative: 5 %
Lymphs Abs: 0.5 10*3/uL — ABNORMAL LOW (ref 1.0–3.6)
MCH: 28.3 pg (ref 26.0–34.0)
MCHC: 31.9 g/dL — ABNORMAL LOW (ref 32.0–36.0)
MCV: 88.5 fL (ref 80.0–100.0)
MONO ABS: 1.1 10*3/uL — AB (ref 0.2–0.9)
Monocytes Relative: 13 %
Neutro Abs: 7 10*3/uL — ABNORMAL HIGH (ref 1.4–6.5)
Neutrophils Relative %: 81 %
PLATELETS: 192 10*3/uL (ref 150–440)
RBC: 4.13 MIL/uL (ref 3.80–5.20)
RDW: 15.3 % — ABNORMAL HIGH (ref 11.5–14.5)
WBC: 8.7 10*3/uL (ref 3.6–11.0)

## 2015-05-24 LAB — COMPREHENSIVE METABOLIC PANEL
ALK PHOS: 65 U/L (ref 38–126)
ALT: 11 U/L — AB (ref 14–54)
AST: 21 U/L (ref 15–41)
Albumin: 3 g/dL — ABNORMAL LOW (ref 3.5–5.0)
Anion gap: 7 (ref 5–15)
BILIRUBIN TOTAL: 0.4 mg/dL (ref 0.3–1.2)
BUN: 19 mg/dL (ref 6–20)
CO2: 29 mmol/L (ref 22–32)
CREATININE: 1.64 mg/dL — AB (ref 0.44–1.00)
Calcium: 8.7 mg/dL — ABNORMAL LOW (ref 8.9–10.3)
Chloride: 104 mmol/L (ref 101–111)
GFR calc Af Amer: 34 mL/min — ABNORMAL LOW (ref >60)
GFR, EST NON AFRICAN AMERICAN: 30 mL/min — AB (ref >60)
GLUCOSE: 151 mg/dL — AB (ref 65–99)
Potassium: 3.9 mmol/L (ref 3.5–5.1)
Sodium: 140 mmol/L (ref 135–145)
TOTAL PROTEIN: 6.6 g/dL (ref 6.5–8.1)

## 2015-05-24 LAB — URINALYSIS COMPLETE WITH MICROSCOPIC (ARMC ONLY)
BACTERIA UA: NONE SEEN
Bilirubin Urine: NEGATIVE
GLUCOSE, UA: NEGATIVE mg/dL
NITRITE: NEGATIVE
Protein, ur: 100 mg/dL — AB
Specific Gravity, Urine: 1.01 (ref 1.005–1.030)
pH: 7 (ref 5.0–8.0)

## 2015-05-24 LAB — CBC
HCT: 35.6 % (ref 35.0–47.0)
HEMOGLOBIN: 11.6 g/dL — AB (ref 12.0–16.0)
MCH: 28.5 pg (ref 26.0–34.0)
MCHC: 32.4 g/dL (ref 32.0–36.0)
MCV: 87.9 fL (ref 80.0–100.0)
Platelets: 194 10*3/uL (ref 150–440)
RBC: 4.05 MIL/uL (ref 3.80–5.20)
RDW: 15.4 % — ABNORMAL HIGH (ref 11.5–14.5)
WBC: 8.6 10*3/uL (ref 3.6–11.0)

## 2015-05-24 LAB — LACTIC ACID, PLASMA
LACTIC ACID, VENOUS: 1.4 mmol/L (ref 0.5–2.0)
LACTIC ACID, VENOUS: 1 mmol/L (ref 0.5–2.0)

## 2015-05-24 LAB — TROPONIN I
TROPONIN I: 0.42 ng/mL — AB (ref <0.031)
Troponin I: 0.43 ng/mL — ABNORMAL HIGH (ref <0.031)
Troponin I: 0.62 ng/mL — ABNORMAL HIGH (ref <0.031)

## 2015-05-24 LAB — CREATININE, SERUM
CREATININE: 1.61 mg/dL — AB (ref 0.44–1.00)
GFR, EST AFRICAN AMERICAN: 35 mL/min — AB (ref >60)
GFR, EST NON AFRICAN AMERICAN: 30 mL/min — AB (ref >60)

## 2015-05-24 LAB — GLUCOSE, CAPILLARY
GLUCOSE-CAPILLARY: 161 mg/dL — AB (ref 65–99)
Glucose-Capillary: 172 mg/dL — ABNORMAL HIGH (ref 65–99)
Glucose-Capillary: 262 mg/dL — ABNORMAL HIGH (ref 65–99)

## 2015-05-24 LAB — HEMOGLOBIN A1C: Hgb A1c MFr Bld: 7.5 % — ABNORMAL HIGH (ref 4.0–6.0)

## 2015-05-24 LAB — VALPROIC ACID LEVEL: Valproic Acid Lvl: 45 ug/mL — ABNORMAL LOW (ref 50.0–100.0)

## 2015-05-24 LAB — TSH: TSH: 4.129 u[IU]/mL (ref 0.350–4.500)

## 2015-05-24 MED ORDER — ONDANSETRON HCL 4 MG PO TABS: 4.0000 mg | ORAL_TABLET | Freq: Four times a day (QID) | ORAL | Status: DC | PRN

## 2015-05-24 MED ORDER — DEXTROSE 5 % IV SOLN
1.0000 g | INTRAVENOUS | Status: DC
  Administered 2015-05-24: 1 g via INTRAVENOUS
  Filled 2015-05-24 (×5): qty 10

## 2015-05-24 MED ORDER — INSULIN GLARGINE 100 UNIT/ML ~~LOC~~ SOLN
14.0000 [IU] | Freq: Every day | SUBCUTANEOUS | Status: DC
  Administered 2015-05-24 – 2015-05-25 (×2): 14 [IU] via SUBCUTANEOUS
  Filled 2015-05-24 (×3): qty 0.14

## 2015-05-24 MED ORDER — METOPROLOL TARTRATE 25 MG PO TABS
25.0000 mg | ORAL_TABLET | Freq: Two times a day (BID) | ORAL | Status: DC
  Administered 2015-05-24 – 2015-05-31 (×12): 25 mg via ORAL
  Filled 2015-05-24 (×13): qty 1

## 2015-05-24 MED ORDER — PIPERACILLIN-TAZOBACTAM 3.375 G IVPB
3.3750 g | Freq: Once | INTRAVENOUS | Status: AC
  Administered 2015-05-24: 3.375 g via INTRAVENOUS
  Filled 2015-05-24: qty 50

## 2015-05-24 MED ORDER — POLYETHYLENE GLYCOL 3350 17 G PO PACK: 17.0000 g | PACK | Freq: Every day | ORAL | Status: DC | PRN

## 2015-05-24 MED ORDER — ASPIRIN 325 MG PO TABS
325.0000 mg | ORAL_TABLET | ORAL | Status: DC
  Administered 2015-05-25 – 2015-05-31 (×7): 325 mg via ORAL
  Filled 2015-05-24 (×9): qty 1

## 2015-05-24 MED ORDER — ALLOPURINOL 100 MG PO TABS
100.0000 mg | ORAL_TABLET | Freq: Every day | ORAL | Status: DC
  Administered 2015-05-25 – 2015-05-31 (×6): 100 mg via ORAL
  Filled 2015-05-24 (×7): qty 1

## 2015-05-24 MED ORDER — SODIUM CHLORIDE 0.9 % IV SOLN
INTRAVENOUS | Status: DC
  Administered 2015-05-24: 11:00:00 via INTRAVENOUS

## 2015-05-24 MED ORDER — PANTOPRAZOLE SODIUM 40 MG PO TBEC: 40.0000 mg | DELAYED_RELEASE_TABLET | Freq: Every day | ORAL | Status: DC

## 2015-05-24 MED ORDER — LEVOTHYROXINE SODIUM 88 MCG PO TABS
88.0000 ug | ORAL_TABLET | Freq: Every day | ORAL | Status: DC
  Administered 2015-05-25 – 2015-05-31 (×7): 88 ug via ORAL
  Filled 2015-05-24 (×7): qty 1

## 2015-05-24 MED ORDER — ASPIRIN 81 MG PO CHEW
324.0000 mg | CHEWABLE_TABLET | Freq: Once | ORAL | Status: AC
  Administered 2015-05-24: 324 mg via ORAL
  Filled 2015-05-24: qty 4

## 2015-05-24 MED ORDER — ONDANSETRON HCL 4 MG/2ML IJ SOLN
4.0000 mg | Freq: Four times a day (QID) | INTRAMUSCULAR | Status: DC | PRN
  Administered 2015-05-26 (×3): 4 mg via INTRAVENOUS
  Filled 2015-05-24 (×3): qty 2

## 2015-05-24 MED ORDER — ACETAMINOPHEN 325 MG PO TABS
650.0000 mg | ORAL_TABLET | Freq: Four times a day (QID) | ORAL | Status: DC | PRN
  Filled 2015-05-24: qty 2

## 2015-05-24 MED ORDER — QUETIAPINE FUMARATE 25 MG PO TABS
25.0000 mg | ORAL_TABLET | Freq: Every day | ORAL | Status: DC
  Administered 2015-05-24 – 2015-05-26 (×3): 25 mg via ORAL
  Filled 2015-05-24 (×3): qty 1

## 2015-05-24 MED ORDER — PRAVASTATIN SODIUM 40 MG PO TABS
40.0000 mg | ORAL_TABLET | Freq: Every day | ORAL | Status: DC
  Administered 2015-05-24 – 2015-05-30 (×6): 40 mg via ORAL
  Filled 2015-05-24 (×6): qty 1

## 2015-05-24 MED ORDER — ACETAMINOPHEN 650 MG RE SUPP: 650.0000 mg | Freq: Four times a day (QID) | RECTAL | Status: DC | PRN

## 2015-05-24 MED ORDER — HEPARIN SODIUM (PORCINE) 5000 UNIT/ML IJ SOLN
5000.0000 [IU] | Freq: Three times a day (TID) | INTRAMUSCULAR | Status: DC
  Administered 2015-05-24 – 2015-05-27 (×9): 5000 [IU] via SUBCUTANEOUS
  Filled 2015-05-24 (×9): qty 1

## 2015-05-24 MED ORDER — SODIUM CHLORIDE 0.9 % IJ SOLN
3.0000 mL | Freq: Two times a day (BID) | INTRAMUSCULAR | Status: DC
  Administered 2015-05-24 – 2015-05-30 (×12): 3 mL via INTRAVENOUS

## 2015-05-24 MED ORDER — DEXTROSE 5 % IV SOLN: 1.0000 g | INTRAVENOUS | Status: DC

## 2015-05-24 MED ORDER — PANTOPRAZOLE SODIUM 40 MG PO TBEC
40.0000 mg | DELAYED_RELEASE_TABLET | Freq: Every day | ORAL | Status: DC
  Administered 2015-05-25 – 2015-05-31 (×7): 40 mg via ORAL
  Filled 2015-05-24 (×7): qty 1

## 2015-05-24 MED ORDER — MECLIZINE HCL 25 MG PO TABS: 25.0000 mg | ORAL_TABLET | Freq: Three times a day (TID) | ORAL | Status: DC | PRN

## 2015-05-24 MED ORDER — INSULIN ASPART 100 UNIT/ML ~~LOC~~ SOLN
0.0000 [IU] | Freq: Every day | SUBCUTANEOUS | Status: DC
  Administered 2015-05-24: 3 [IU] via SUBCUTANEOUS
  Administered 2015-05-25: 2 [IU] via SUBCUTANEOUS
  Administered 2015-05-26: 3 [IU] via SUBCUTANEOUS
  Administered 2015-05-27 – 2015-05-29 (×2): 2 [IU] via SUBCUTANEOUS
  Filled 2015-05-24: qty 2
  Filled 2015-05-24: qty 3
  Filled 2015-05-24 (×2): qty 2
  Filled 2015-05-24: qty 3

## 2015-05-24 MED ORDER — INSULIN ASPART 100 UNIT/ML ~~LOC~~ SOLN
0.0000 [IU] | Freq: Three times a day (TID) | SUBCUTANEOUS | Status: DC
  Administered 2015-05-24 (×2): 2 [IU] via SUBCUTANEOUS
  Administered 2015-05-25: 3 [IU] via SUBCUTANEOUS
  Administered 2015-05-25: 2 [IU] via SUBCUTANEOUS
  Administered 2015-05-26 (×3): 7 [IU] via SUBCUTANEOUS
  Administered 2015-05-27 (×2): 5 [IU] via SUBCUTANEOUS
  Filled 2015-05-24: qty 3
  Filled 2015-05-24: qty 5
  Filled 2015-05-24: qty 2
  Filled 2015-05-24: qty 7
  Filled 2015-05-24: qty 2
  Filled 2015-05-24: qty 5
  Filled 2015-05-24: qty 7
  Filled 2015-05-24: qty 2
  Filled 2015-05-24: qty 7

## 2015-05-24 NOTE — ED Notes (Signed)
Patient transported to CT 

## 2015-05-24 NOTE — H&P (Signed)
[facility] - [place] at [facility]   PATIENT NAME: [name] [name]    MR#:  [id number]  DATE OF BIRTH:  05-24-1941  DATE OF ADMISSION:  05/24/2015  PRIMARY CARE PHYSICIAN: No PCP Per Patient   REQUESTING/REFERRING PHYSICIAN: Dr Huel Cote  CHIEF COMPLAINT:   Chief Complaint  Patient presents with  . Altered Mental Status    HISTORY OF PRESENT ILLNESS:  [name]  is a 74 y.o. 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 History from EMS is that she is usually alert and oriented 4, ambulatory. On the day prior to presentation she became less responsive and mobile. On presentation to the emergency room she is lethargic, febrile and hypotensive. She has a urinary tract infection as well as an elevated troponin. She is being admitted for altered mental status, urinary tract infection and NSTEMI.  PAST MEDICAL HISTORY:   Past Medical History  Diagnosis Date  . Diabetes mellitus   . Hypertension   . CVA (cerebral infarction)   . GERD (gastroesophageal reflux disease)   . HLD (hyperlipidemia)   . Heart murmur   . Carotid artery occlusion 02/15/2009  . Stroke (HCC)   . Dementia     PAST SURGICAL HISTORY:   Past Surgical History  Procedure Laterality Date  . Appendectomy    . Tonsillectomy      SOCIAL HISTORY:   Social History  Substance Use Topics  . Smoking status: Former Smoker    Types: Cigarettes    Quit date: 07/20/1985  . Smokeless tobacco: Never Used  . Alcohol Use: No    FAMILY HISTORY:   Family History  Problem Relation Age of Onset  . Diabetes Mother   . Hypertension Mother   . Diabetes Father   . Hypertension Father   . Diabetes Sister   . Hypertension Sister     DRUG ALLERGIES:   Allergies  Allergen Reactions  . Fexofenadine Hcl Rash    REVIEW OF SYSTEMS:    ROS unable to obtain due to altered mental status  MEDICATIONS AT HOME:   Prior to Admission medications   Medication Sig Start Date End Date Taking? Authorizing Provider  allopurinol (ZYLOPRIM) 100 MG tablet Take 100 mg by mouth daily.   Yes Historical Provider, MD  amLODipine (NORVASC) 10 MG tablet Take 10 mg by mouth daily.   Yes Historical Provider, MD  aspirin 325 MG tablet Take 325 mg by mouth every morning.    Yes Historical Provider, MD  Calcium Carb-Cholecalciferol (CALCIUM 600+D3) 600-400 MG-UNIT TABS Take 1 tablet by mouth 2 (two) times daily with a meal.   Yes Historical Provider, MD  divalproex (DEPAKOTE) 250 MG DR tablet Take 250 mg by mouth 2 (two) times daily.   Yes Historical Provider, MD  furosemide (LASIX) 40 MG tablet Take 40 mg by mouth daily.   Yes Historical Provider, MD  gabapentin (NEURONTIN) 300 MG capsule Take 300 mg by mouth at bedtime.   Yes Historical Provider, MD  insulin aspart (NOVOLOG) 100 UNIT/ML injection Inject 7-10 Units into the skin 3 (three) times daily before meals. 7 units before breakfast, 10 units before lunch, and 10 units before supper   Yes Historical Provider, MD  insulin glargine (LANTUS) 100 UNIT/ML injection Inject 14 Units into the skin at bedtime.    Yes Historical Provider, MD  levothyroxine (SYNTHROID, LEVOTHROID) 88 MCG tablet  Take 88 mcg by mouth daily before breakfast.   Yes Historical Provider, MD  lovastatin (MEVACOR) 20 MG tablet Take 20 mg by mouth every evening.    Yes Historical Provider, MD  meclizine (ANTIVERT) 25 MG tablet Take 1 tablet (25 mg total) by mouth 3 (three) times daily as needed for dizziness or nausea. 12/10/14  Yes Sharman Cheek, MD  metoCLOPramide (REGLAN) 5 MG tablet Take 5 mg by mouth 4 (four) times daily -  before meals and at bedtime.    Yes Historical Provider, MD  metoprolol tartrate (LOPRESSOR) 25 MG tablet Take 25 mg by mouth 2 (two) times daily.   Yes Historical Provider, MD  omeprazole (PRILOSEC) 20  MG capsule Take 20 mg by mouth 2 (two) times daily.   Yes Historical Provider, MD  potassium chloride (K-DUR) 10 MEQ tablet Take 10 mEq by mouth daily. With furosemide   Yes Historical Provider, MD  QUEtiapine (SEROQUEL) 25 MG tablet Take 25 mg by mouth at bedtime.   Yes Historical Provider, MD  traMADol-acetaminophen (ULTRACET) 37.5-325 MG tablet Take 1 tablet by mouth every 8 (eight) hours as needed for moderate pain or severe pain.   Yes Historical Provider, MD      VITAL SIGNS:  Blood pressure 111/47, pulse 90, temperature 101.5 F (38.6 C), temperature source Rectal, resp. rate 14, height 4\' 9"  (1.448 m), weight 70.761 kg (156 lb), SpO2 100 %.  PHYSICAL EXAMINATION:  GENERAL:  74 y.o.-year-old patient lying in the bed with no acute distress.  EYES: Keeps both eyelids halfway closed, states that she cannot see the upper half of the visual field, right eye not tracking with left, pupils are equal round and reactive, no icterus HEENT: Head atraumatic, normocephalic. Oropharynx and nasopharynx clear. Mucous membranes dry. Edentulous. Will not open her mouth very wide  NECK:  Supple, no jugular venous distention. No thyroid enlargement, no tenderness.  LUNGS: Normal breath sounds bilaterally, no wheezing, rales,rhonchi or crepitation. No use of accessory muscles of respiration.  CARDIOVASCULAR: S1, S2 normal.3/6 systolic ejection murmur  murmurs,  no rubs, or gallops.  ABDOMEN: Soft, nontender, nondistended. Bowel sounds present. No organomegaly or mass. Guarding no rebound  EXTREMITIES:Trace bilaterall edema,  no cyanosis, or clubbing. Both ankles are wrapped in Ace bandages, there are no skin wounds  NEUROLOGIC: Cranial nerves seem to be intact, she does not participate much with neurologic examination, right eye may be deviating to the right, she is able to move her upper extremities very slowly strength 4+ out of 5, she is not moving the lower extremities, sensation seems to be intact   PSYCHIATRIC: The patient is sleepy  and oriented  2 person  SKIN: No obvious rash, lesion, or ulcer.   LABORATORY PANEL:   CBC  Recent Labs Lab 05/24/15 0808  WBC 8.7  HGB 11.7*  HCT 36.6  PLT 192   ------------------------------------------------------------------------------------------------------------------  Chemistries   Recent Labs Lab 05/24/15 0753  NA 140  K 3.9  CL 104  CO2 29  GLUCOSE 151*  BUN 19  CREATININE 1.64*  CALCIUM 8.7*  AST 21  ALT 11*  ALKPHOS 65  BILITOT 0.4   ------------------------------------------------------------------------------------------------------------------  Cardiac Enzymes  Recent Labs Lab 05/24/15 0753  TROPONINI 0.42*   ------------------------------------------------------------------------------------------------------------------  RADIOLOGY:  Ct Head Wo Contrast  05/24/2015  CLINICAL DATA:  Altered mental status. Patient became immobile yesterday. EXAM: CT HEAD WITHOUT CONTRAST TECHNIQUE: Contiguous axial images were obtained from the base of the skull through the vertex without contrast. COMPARISON:  05/19/2013 FINDINGS: There is extensive encephalomalacia in the cerebellar hemispheres compatible with prior ischemic changes. Stable mild atrophy. There appears to be encephalomalacia in the right MCA territory suggesting an old infarct. Suspect old ischemic changes in the left basal ganglia/ left internal capsule genu. No evidence for acute hemorrhage, mass lesion, midline shift or hydrocephalus. Limited evaluation for an acute infarct based on the old ischemic changes. Visualized paranasal sinuses are clear. Visualized mastoid air cells are clear. No acute bone abnormality. IMPRESSION: Negative for acute hemorrhage. Evidence for old ischemic changes, particularly involving the cerebellar hemispheres and right MCA territory. If there is concern for a new infarct, consider further evaluation with MRI. Electronically Signed    By: Richarda Overlie M.D.   On: 05/24/2015 08:37   Dg Chest Port 1 View  05/24/2015  CLINICAL DATA:  Altered mental status. EXAM: PORTABLE CHEST 1 VIEW COMPARISON:  May 18, 2013. FINDINGS: Stable cardiomediastinal silhouette. No pneumothorax or pleural effusion is noted. Both lungs are clear. The visualized skeletal structures are unremarkable. IMPRESSION: No acute cardiopulmonary abnormality seen. Electronically Signed   By: Lupita Raider, M.D.   On: 05/24/2015 08:21    EKG:   Orders placed or performed during the hospital encounter of 05/24/15  . ED EKG  . ED EKG    IMPRESSION AND PLAN:   #1 altered mental status: Etiology unclear at this time. Very likely metabolic encephalopathy due to urinary tract infection. Due to her history of stroke and current immobility of lower extremities I will pursue MRI of the brain. Will also check a TSH and a Depakote level. Will hold her sedating medications at this time. She will need a physical therapy evaluation prior to discharge.  #2  NSTEMI: Presenting troponin 0.42. Last measured troponin is 0.03. Possibly due to cardiac strain in the setting of infection and hypotension. EKG with no significant changes at this time. She has received 325 of aspirin. She is actually on this medication daily. Continue beta blocker as well. Monitor on telemetry. Cycle cardiac enzymes. She has multiple risk factors including age, hypertension, diabetes and known peripheral vascular disease. We'll obtain 2-D echocardiogram as well as cardiology consultation.  #3 urinary tract infection: Urine and blood cultures are pending. She has been given Zosyn in the emergency room. I will switch to Rocephin.  #4 acute kidney injury: baseline creatinine is 1.3, today 1.6. She is hypotensive. We will continue IV fluids. Continue to monitor renal function. Hold nephrotoxins. Hold diuretics at this time  #5 diabetes mellitus type 2: Check hemoglobin A1c. Continue Lantus. Continue  sliding scale.  #6 hypertension: She is actually hypotensive at this time likely due to infection. Hold antihypertensive agents.  #7 dementia: I have spoken with the patient's daughter. At baseline the patient is alert and oriented 4. She uses a walker. She has right-sided weakness. She did have behavioral disturbance initially 2 years ago when first moving to her living facility. She has been on Depakote and Seroquel since that time. She has not had any recent behavioral disturbance.  All the records are reviewed and case discussed with ED provider. Management plans discussed with the patient, family and they are in agreement. Care plan discussed with the patient's daughter, [name], who is her power of attorney.  CODE STATUS: Full  TOTAL TIME TAKING CARE OF THIS PATIENT: 45 minutes.  Greater than 50% of time spent in coordination of care and counseling.  Elby Showers M.D on 05/24/2015 at 10:48 AM  Between 7am  to 6pm - Pager - [phone]  After 6pm go to [email or web address] - password [email or web address] [facility]  [facility]  Office  [phone]  CC: Primary care physician; No PCP Per Patient

## 2015-05-24 NOTE — Progress Notes (Signed)
MD Clent RidgesWalsh was notified that pt was wheezy. Order was to stop IVF. Pt down for MRI.

## 2015-05-24 NOTE — Progress Notes (Signed)
*  PRELIMINARY RESULTS* Echocardiogram 2D Echocardiogram has been performed.  [name] [name] 05/24/2015, 3:19 PM

## 2015-05-24 NOTE — ED Notes (Signed)
Pt arrives to ED via [facility] from [facility] with cc of AMS. According to EMS, pt was discovered to have AMS 05/23/15 approx 1300. Pt baseline of ambulatory and alert and oriented X4. Pt became immobile yesterday at 1300. Pt denies any pain. Pt able to state name and place but disoriented to time and situation. Pt hx of CVA, deficits of R side. 160 CBG per EMS. 110/60 BP, 96% on RA. 20G to L hand started by EMS, saline locked.

## 2015-05-24 NOTE — ED Provider Notes (Signed)
Time Seen: Approximately 49 I have reviewed the triage notes  Chief Complaint: Altered Mental Status   History of Present Illness: [name] [name] is a 74 y.o. female who was transported here by EMS from [facility] for evaluation of altered mental status. Patient has a known history of dementia as well as normally ambulatory with a walker and is interactive and verbal. The patient's been noted not to be any of those above-mentioned symptoms and  not having any new focal weakness at this time the patient with her history of dementia and essentially is nonverbal at this time and is unable to offer any history or review of systems. Information on her history and past medical history etc. is been acquired from the past records and EMS report.   Past Medical History  Diagnosis Date  . Diabetes mellitus   . Hypertension   . CVA (cerebral infarction)   . GERD (gastroesophageal reflux disease)   . HLD (hyperlipidemia)   . Heart murmur   . Carotid artery occlusion 02/15/2009  . Stroke (HCC)   . Dementia     Patient Active Problem List   Diagnosis Date Noted  . Altered mental status 05/24/2015  . NSTEMI (non-ST elevated myocardial infarction) (HCC) 05/24/2015  . Occlusion and stenosis of carotid artery without mention of cerebral infarction 10/08/2011  . CKD (chronic kidney disease) 09/25/2011  . Nausea and vomiting 09/22/2011  . HTN (hypertension) 09/22/2011  . H/O: CVA (cardiovascular accident) 09/22/2011  . Diabetes mellitus, type 2 (HCC) 09/22/2011    Past Surgical History  Procedure Laterality Date  . Appendectomy    . Tonsillectomy      Past Surgical History  Procedure Laterality Date  . Appendectomy    . Tonsillectomy      No current outpatient prescriptions on file.  Allergies:  Fexofenadine hcl  Family History: Family History  Problem Relation Age of Onset  . Diabetes Mother   . Hypertension Mother   . Diabetes Father   . Hypertension  Father   . Diabetes Sister   . Hypertension Sister     Social History: Social History  Substance Use Topics  . Smoking status: Former Smoker    Types: Cigarettes    Quit date: 07/20/1985  . Smokeless tobacco: Never Used  . Alcohol Use: No     Review of Systems:  Review of systems is obtained from the medical record and EMS report 10 point review of systems was performed and was otherwise negative:  Constitutional: No fever Eyes: No visual disturbances ENT: No sore throat, ear pain Cardiac: No chest pain Respiratory: No shortness of breath, wheezing, or stridor Abdomen: No abdominal pain, no vomiting, No diarrhea Endocrine: No weight loss, No night sweats Extremities: No peripheral edema, cyanosis Skin: No rashes, easy bruising Neurologic: No new focal weakness, trouble with speech or swollowing Urologic: No dysuria, Hematuria, or urinary frequency   Physical Exam:  ED Triage Vitals  Enc Vitals Group     BP 05/24/15 0757 101/71 mmHg     Pulse Rate 05/24/15 0757 93     Resp 05/24/15 0757 17     Temp 05/24/15 0841 101.5 F (38.6 C)     Temp Source 05/24/15 0841 Rectal     SpO2 05/24/15 0757 99 %     Weight 05/24/15 0757 156 lb (70.761 kg)     Height 05/24/15 0757  (1.448 m)     Head Cir --      Peak Flow --  Pain Score --      Pain Loc --      Pain Edu? --      Excl. in GC? --     General: Awake , Alert , and Oriented times 1. Patient's nonverbal although will occasionally follow commands Head: Normal cephalic , atraumatic Eyes: Pupils equal , round, reactive to light Nose/Throat: No nasal drainage, patent upper airway without erythema or exudate.  Neck: Supple, Full range of motion, No anterior adenopathy or palpable thyroid masses Lungs: Clear to ascultation without wheezes , rhonchi, or rales. Diminished bilaterally at the bases Heart: Regular rate, regular rhythm without murmurs , gallops , or rubs Abdomen: Soft, non tender without rebound,  guarding , or rigidity; bowel sounds positive and symmetric in all 4 quadrants. No organomegaly .        Extremities: Mild edema in both lower extremities with no obvious infectious rash Neurologic: Nonambulatory Motor symmetric motor weakness in the right upper and lower extremity sensory intact Skin: warm, dry, no rashes   Labs:   All laboratory work was reviewed including any pertinent negatives or positives listed below:  Labs Reviewed  COMPREHENSIVE METABOLIC PANEL - Abnormal; Notable for the following:    Glucose, Bld 151 (*)    Creatinine, Ser 1.64 (*)    Calcium 8.7 (*)    Albumin 3.0 (*)    ALT 11 (*)    GFR calc non Af Amer 30 (*)    GFR calc Af Amer 34 (*)    All other components within normal limits  CBC WITH DIFFERENTIAL/PLATELET - Abnormal; Notable for the following:    Hemoglobin 11.7 (*)    MCHC 31.9 (*)    RDW 15.3 (*)    Neutro Abs 7.0 (*)    Lymphs Abs 0.5 (*)    Monocytes Absolute 1.1 (*)    All other components within normal limits  TROPONIN I - Abnormal; Notable for the following:    Troponin I 0.42 (*)    All other components within normal limits  URINALYSIS COMPLETEWITH MICROSCOPIC ([facility] ONLY) - Abnormal; Notable for the following:    Color, Urine YELLOW (*)    APPearance TURBID (*)    Ketones, ur TRACE (*)    Hgb urine dipstick 1+ (*)    Protein, ur 100 (*)    Leukocytes, UA 3+ (*)    Squamous Epithelial / LPF 0-5 (*)    All other components within normal limits  VALPROIC ACID LEVEL - Abnormal; Notable for the following:    Valproic Acid Lvl 45 (*)    All other components within normal limits  GLUCOSE, CAPILLARY - Abnormal; Notable for the following:    Glucose-Capillary 172 (*)    All other components within normal limits  URINE CULTURE  CULTURE, BLOOD (ROUTINE X 2)  CULTURE, BLOOD (ROUTINE X 2)  LACTIC ACID, PLASMA  TSH  LACTIC ACID, PLASMA  HEMOGLOBIN A1C  TROPONIN I  TROPONIN I  CBC  CREATININE, SERUM   of note is an elevated  troponin level. Urinalysis appears to be positive for infection. Blood culture and urine culture are pending  EKG:  ED ECG REPORT I, Jennye Moccasin, the attending physician, personally viewed and interpreted this ECG.  Date: 05/24/2015 EKG Time: 0755 Rate: 93 Rhythm: normal sinus rhythm QRS Axis: normal Intervals: normal ST/T Wave abnormalities: normal Conduction Disutrbances: none Narrative Interpretation: unremarkable  CT HEAD WITHOUT CONTRAST  TECHNIQUE: Contiguous axial images were obtained from the base of the skull through  the vertex without contrast.  COMPARISON: 05/19/2013  FINDINGS: There is extensive encephalomalacia in the cerebellar hemispheres compatible with prior ischemic changes. Stable mild atrophy. There appears to be encephalomalacia in the right MCA territory suggesting an old infarct. Suspect old ischemic changes in the left basal ganglia/ left internal capsule genu. No evidence for acute hemorrhage, mass lesion, midline shift or hydrocephalus. Limited evaluation for an acute infarct based on the old ischemic changes. Visualized paranasal sinuses are clear. Visualized mastoid air cells are clear. No acute bone abnormality.  IMPRESSION: Negative for acute hemorrhage.  Evidence for old ischemic changes, particularly involving the cerebellar hemispheres and right MCA territory. If there is concern for a new infarct, consider further evaluation with MRI.   Electronically Signed By: Richarda OverlieAdam Henn M.D. On: 05/24/2015 08:37          DG Chest Port 1 View (Final result) Result time: 05/24/15 08:21:33   Final result by Rad Results In Interface (05/24/15 08:21:33)   Narrative:   CLINICAL DATA: Altered mental status.  EXAM: PORTABLE CHEST 1 VIEW  COMPARISON: May 18, 2013.  FINDINGS: Stable cardiomediastinal silhouette. No pneumothorax or pleural effusion is noted. Both lungs are clear. The visualized skeletal structures are  unremarkable.  IMPRESSION: No acute cardiopulmonary abnormality seen.      Radiology:         I personally reviewed the radiologic studies     Critical Care: CRITICAL CARE Performed by: Jennye MoccasinBrian S Quigley   Total critical care time: 43 minutes minutes  Critical care time was exclusive of separately billable procedures and treating other patients.  Critical care was necessary to treat or prevent imminent or life-threatening deterioration.  Critical care was time spent personally by me on the following activities: development of treatment plan with patient and/or surrogate as well as nursing, discussions with consultants, evaluation of patient's response to treatment, examination of patient, obtaining history from patient or surrogate, ordering and performing treatments and interventions, ordering and review of laboratory studies, ordering and review of radiographic studies, pulse oximetry and re-evaluation of patient's condition. Initiation of treatment for altered mental status and sepsis  ED course:  Patient was evaluated mostly for altered mental status and was initiated on a septic workup when she was found to have a fever rectally. In our urine catheterization was obtained which showed very turbid urine which was sent for evaluation and appears to show an signs of infection. Patient has blood cultures and urine cultures pending at this time was initiated on IV antibiotic therapy. Her troponin is elevated though no obvious ischemic findings are noted on her EKG. Review of past troponin levels did not show any indication of previously elevated levels. Patient's case was reviewed with the hospitalist team, further disposition and management depends upon their evaluation    Assessment:  Urosepsis Elevated troponin   Final Clinical Impression: * Final diagnoses:  Sepsis, due to unspecified organism [facility](HCC)  Acute urinary tract infection     Plan:  Patient's case was  reviewed with the hospitalist team, further disposition and management depends upon her evaluation.           Jennye MoccasinBrian S Quigley, MD 05/24/15 [phone]

## 2015-05-24 NOTE — ED Notes (Signed)
Troponin 0.42 verbally acknowledged to Dr Huel CoteQuigley

## 2015-05-24 NOTE — Progress Notes (Signed)
Skin checked by Mya Woodard RN 

## 2015-05-24 NOTE — Progress Notes (Signed)
Room air. NSR. Takes meds crushed in apple sauce. MRI was neg. Family was updated. Neuro consult added. IVF stopped due to wheezing. Pt refused lunch tray. Incontinent. Pt has no further concerns at this time.

## 2015-05-24 NOTE — Progress Notes (Signed)
MD Clent RidgesWalsh was notified that MRI was neg. Neuro consult was added. No further orders.

## 2015-05-25 LAB — CBC
HEMATOCRIT: 34.9 % — AB (ref 35.0–47.0)
HEMOGLOBIN: 11.2 g/dL — AB (ref 12.0–16.0)
MCH: 28.2 pg (ref 26.0–34.0)
MCHC: 32.1 g/dL (ref 32.0–36.0)
MCV: 87.9 fL (ref 80.0–100.0)
Platelets: 166 10*3/uL (ref 150–440)
RBC: 3.97 MIL/uL (ref 3.80–5.20)
RDW: 15.5 % — AB (ref 11.5–14.5)
WBC: 12.5 10*3/uL — AB (ref 3.6–11.0)

## 2015-05-25 LAB — BASIC METABOLIC PANEL
ANION GAP: 4 — AB (ref 5–15)
BUN: 17 mg/dL (ref 6–20)
CALCIUM: 8.5 mg/dL — AB (ref 8.9–10.3)
CHLORIDE: 109 mmol/L (ref 101–111)
CO2: 28 mmol/L (ref 22–32)
Creatinine, Ser: 1.43 mg/dL — ABNORMAL HIGH (ref 0.44–1.00)
GFR calc non Af Amer: 35 mL/min — ABNORMAL LOW (ref >60)
GFR, EST AFRICAN AMERICAN: 41 mL/min — AB (ref >60)
Glucose, Bld: 196 mg/dL — ABNORMAL HIGH (ref 65–99)
POTASSIUM: 3.5 mmol/L (ref 3.5–5.1)
Sodium: 141 mmol/L (ref 135–145)

## 2015-05-25 LAB — GLUCOSE, CAPILLARY
GLUCOSE-CAPILLARY: 238 mg/dL — AB (ref 65–99)
GLUCOSE-CAPILLARY: 192 mg/dL — AB (ref 65–99)
GLUCOSE-CAPILLARY: 250 mg/dL — AB (ref 65–99)
Glucose-Capillary: 124 mg/dL — ABNORMAL HIGH (ref 65–99)

## 2015-05-25 LAB — TROPONIN I: TROPONIN I: 0.41 ng/mL — AB (ref <0.031)

## 2015-05-25 MED ORDER — DEXTROSE 5 % IV SOLN
2.0000 g | INTRAVENOUS | Status: DC
  Administered 2015-05-25 – 2015-05-26 (×2): 2 g via INTRAVENOUS
  Filled 2015-05-25 (×3): qty 2

## 2015-05-25 MED ORDER — TRAMADOL-ACETAMINOPHEN 37.5-325 MG PO TABS: 1.0000 | ORAL_TABLET | Freq: Three times a day (TID) | ORAL | Status: DC | PRN

## 2015-05-25 MED ORDER — DIVALPROEX SODIUM 250 MG PO DR TAB
250.0000 mg | DELAYED_RELEASE_TABLET | Freq: Two times a day (BID) | ORAL | Status: DC
  Administered 2015-05-25 – 2015-05-31 (×12): 250 mg via ORAL
  Filled 2015-05-25 (×13): qty 1

## 2015-05-25 MED ORDER — GABAPENTIN 600 MG PO TABS
300.0000 mg | ORAL_TABLET | Freq: Every day | ORAL | Status: DC
  Administered 2015-05-25: 300 mg via ORAL
  Filled 2015-05-25: qty 1

## 2015-05-25 NOTE — Progress Notes (Signed)
Patient is currently on ceftriaxone 1g q 24 hours. Urine and blood clutures show gram neg rods. Per protocol, ceftriaxone 2g is used for bacteremia. Therefore per policy, ceftriaxone has been increased to 2g daily. Follow up blood/urine cultures Olene FlossMelissa D Zeyna Mkrtchyan, Pharm.D Clinical Pharmacist

## 2015-05-25 NOTE — Progress Notes (Signed)
Morrie SheldonAshley, caregiver [facility] and daughter both called for updates; passwords provided and both updated on pt condition.

## 2015-05-25 NOTE — Progress Notes (Signed)
[facility] - [place] at [facility]   PATIENT NAME: [name] [name]    MR#:  [id number]  DATE OF BIRTH:  04/30/1941  SUBJECTIVE:  CHIEF COMPLAINT:   Chief Complaint  Patient presents with  . Altered Mental Status   - Patient is pleasantly confused. Answering simple questions and also following commands at this time. -Right eyelid ptosis noted. And also right upper extremity strength is lower than the left one.  REVIEW OF SYSTEMS:  Review of Systems  Constitutional: Negative for fever and chills.  HENT: Negative for ear discharge and ear pain.   Eyes: Negative for blurred vision and double vision.  Respiratory: Negative for cough, shortness of breath and wheezing.   Cardiovascular: Negative for chest pain, palpitations and leg swelling.  Gastrointestinal: Negative for nausea, vomiting, abdominal pain, diarrhea and constipation.  Genitourinary: Negative for dysuria.  Musculoskeletal: Negative for myalgias.  Neurological: Positive for focal weakness. Negative for dizziness, speech change, seizures and headaches.  Psychiatric/Behavioral: Negative for depression.    DRUG ALLERGIES:   Allergies  Allergen Reactions  . Fexofenadine Hcl Rash    VITALS:  Blood pressure 150/57, pulse 80, temperature 98.7 F (37.1 C), temperature source Oral, resp. rate 18, height  (1.448 m), weight 67.132 kg (148 lb), SpO2 100 %.  PHYSICAL EXAMINATION:  Physical Exam  GENERAL:  74 y.o.-year-old patient lying in the bed with no acute distress.  EYES: Ptosis of right eyelid noted. Pupils equal, round, reactive to light and accommodation. No scleral icterus. Extraocular muscles intact.  HEENT: Head atraumatic, normocephalic. Oropharynx and nasopharynx clear.  NECK:  Supple, no jugular venous distention. No thyroid enlargement, no tenderness.  LUNGS: Normal breath sounds bilaterally, no wheezing, rales,rhonchi or crepitation. No use of accessory muscles of respiration.   CARDIOVASCULAR: S1, S2 normal. No  rubs, or gallops. G/6 systolic murmur is present ABDOMEN: Soft, nontender, nondistended. Bowel sounds present. No organomegaly or mass.  EXTREMITIES: No pedal edema, cyanosis, or clubbing.  NEUROLOGIC: Cranial nerves II through XII are intact. Ptosis of right eyelid noted. Right hand placed like flexion deformity at the wrist. However went following commands she has good hand grip but the strength of right upper extremity is noted to be 4/5. Patient is left-handed according to her. Left upper extremity strength is 5/ 5. Bilateral lower extremities are 4/5 in bed. Sensation intact. Gait not checked.  PSYCHIATRIC: The patient is alert and pleasantly confused.  SKIN: No obvious rash, lesion, or ulcer.    LABORATORY PANEL:   CBC  Recent Labs Lab 05/25/15 0233  WBC 12.5*  HGB 11.2*  HCT 34.9*  PLT 166   ------------------------------------------------------------------------------------------------------------------  Chemistries   Recent Labs Lab 05/24/15 0753  05/25/15 0233  NA 140  --  141  K 3.9  --  3.5  CL 104  --  109  CO2 29  --  28  GLUCOSE 151*  --  196*  BUN 19  --  17  CREATININE 1.64*  < > 1.43*  CALCIUM 8.7*  --  8.5*  AST 21  --   --   ALT 11*  --   --   ALKPHOS 65  --   --   BILITOT 0.4  --   --   < > = values in this interval not displayed. ------------------------------------------------------------------------------------------------------------------  Cardiac Enzymes  Recent Labs Lab 05/25/15 0233  TROPONINI 0.41*   ------------------------------------------------------------------------------------------------------------------  RADIOLOGY:  Ct Head Wo Contrast  05/24/2015  CLINICAL DATA:  Altered mental status.  Patient became immobile yesterday. EXAM: CT HEAD WITHOUT CONTRAST TECHNIQUE: Contiguous axial images were obtained from the base of the skull through the vertex without contrast. COMPARISON:  05/19/2013  FINDINGS: There is extensive encephalomalacia in the cerebellar hemispheres compatible with prior ischemic changes. Stable mild atrophy. There appears to be encephalomalacia in the right MCA territory suggesting an old infarct. Suspect old ischemic changes in the left basal ganglia/ left internal capsule genu. No evidence for acute hemorrhage, mass lesion, midline shift or hydrocephalus. Limited evaluation for an acute infarct based on the old ischemic changes. Visualized paranasal sinuses are clear. Visualized mastoid air cells are clear. No acute bone abnormality. IMPRESSION: Negative for acute hemorrhage. Evidence for old ischemic changes, particularly involving the cerebellar hemispheres and right MCA territory. If there is concern for a new infarct, consider further evaluation with MRI. Electronically Signed   By: Richarda OverlieAdam  Henn M.D.   On: 05/24/2015 08:37   Mr Brain Wo Contrast  05/24/2015  CLINICAL DATA:  74 year old female with altered mental status. History of prior infarct. Lethargy, fever, hypotension. Initial encounter. EXAM: MRI HEAD WITHOUT CONTRAST TECHNIQUE: Multiplanar, multiecho pulse sequences of the brain and surrounding structures were obtained without intravenous contrast. COMPARISON:  Head CT without contrast 0821 hours today. Brain MRI 05/19/2013. FINDINGS: Study is intermittently degraded by motion artifact despite repeated imaging attempts. No restricted diffusion or evidence of acute infarction. Major intracranial vascular flow voids are stable, with absence of the right ICA siphon and terminus flow signal. Widespread chronic infarcts throughout both cerebellar hemispheres. Widespread right hemisphere encephalomalacia, sparing the right ACA territory and right deep gray matter nuclei. Left corona radiata chronic lacunar infarct with confluent T2 and FLAIR hyperintensity. No ventriculomegaly. No acute intracranial mass effect. Cerebral volume appears stable. Pituitary and cervicomedullary  junction grossly normal. Normal bone marrow signal. Mastoids and paranasal sinuses appear clear. Orbit and scalp soft tissues appear stable. IMPRESSION: 1.  No acute intracranial abnormality identify. 2. Very advanced chronic ischemic disease, stable since 2014. Electronically Signed   By: Odessa FlemingH  Hall M.D.   On: 05/24/2015 14:55   Dg Chest Port 1 View  05/24/2015  CLINICAL DATA:  Altered mental status. EXAM: PORTABLE CHEST 1 VIEW COMPARISON:  May 18, 2013. FINDINGS: Stable cardiomediastinal silhouette. No pneumothorax or pleural effusion is noted. Both lungs are clear. The visualized skeletal structures are unremarkable. IMPRESSION: No acute cardiopulmonary abnormality seen. Electronically Signed   By: Lupita RaiderJames  Green Jr, M.D.   On: 05/24/2015 08:21    EKG:   Orders placed or performed during the hospital encounter of 05/24/15  . ED EKG  . ED EKG    ASSESSMENT AND PLAN:   74 year old female with history of dementia, diabetes, hypertension, history of CVA with minimal right-sided weakness presents from [facility] with altered mental status.  #1 altered mental status-cigarette or metabolic encephalopathy from sepsis. -Patient has likely that creamy and also urinary tract infection. -Continue gentle hydration. IV antibiotics and continue to monitor. -CT of the head negative for any acute intracranial findings.  #2 sepsis-secondary to urinary tract infection. -Blood and urine cultures are pending -Blood cultures so far growing gram-negative rods. -Continue Rocephin for now.  #3 CK D-stable, at baseline.  #4 dementia with depression-mental status seems to be improving. -Continue her Seroquel. Also on Depakote. -Home pain medications have been restarted  #5 diabetes mellitus-on Lantus and sliding scale insulin.  #6 history of CVA-residual right-sided weakness. -MRI with old left coronary data infarcts. No acute infarct noted. -  Continue to monitor at this  time. Physical therapy consult  When able to. Patient at baseline ambulates without any trouble.  #7 DVT prophylaxis - on subcutaneous heparin   All the records are reviewed and case discussed with Care Management/Social Workerr. Management plans discussed with the patient, family and they are in agreement.  CODE STATUS: Full code  TOTAL TIME TAKING CARE OF THIS PATIENT: 38 minutes.   POSSIBLE D/C IN 2-3 DAYS, DEPENDING ON CLINICAL CONDITION.   Enid Baas M.D on 05/25/2015 at 1:46 PM  Between 7am to 6pm - Pager - [phone]  After 6pm go to [email or web address] - password [email or web address] [facility]  [facility]  Office  [phone]  CC: Primary care physician; No PCP Per Patient

## 2015-05-25 NOTE — Consult Note (Signed)
[facility]  CARDIOLOGY CONSULT NOTE  Patient ID: [name] [name] MRN: [id number] DOB/AGE: 23-Dec-1940 74 y.o.  Admit date: 05/24/2015 Referring Physician Dr. Nemiah Commanderkalisetti Primary Physician   Primary Cardiologist   Reason for Consultation:  Elevated troponin  HPI: Pt is a 74 yo female with history of diabetes mellitus, hypertension, hyperlipidemia, cva in 2014 as well as dementia residing in a memory unit. She was admitted after being noted to be less responsive and verbal. She is usually oriented and ambulatory with assistance. She was noted by her family to be less responsive and was brought to the er. She was noted to have uti and elevated serum troponin. Pt is not able to give a history. EKG showed nsr with no ischemic changes. CXR showed no acute cardiopulmonary abnormality. Head ct and brain mri showed old ischemic changes with no acute intrcranial abnormalities noted. She had stable advanced chronic ischemic disease.  URine showed 3+ leukocytes with tntc wbc. Culture is pending. Echo showed normal lv function with no rwma, no high grade valvular disease. Troponin has remained at 0.42-0.62.   ROS Review of Systems - History obtained from child, chart review and unobtainable from patient due to mental status General ROS: positive for  - altered mental status Respiratory ROS: no cough, shortness of breath, or wheezing Cardiovascular ROS: no chest pain or dyspnea on exertion Gastrointestinal ROS: no abdominal pain, change in bowel habits, or black or bloody stools Neurological ROS: positive for - gait disturbance and weakness   Past Medical History  Diagnosis Date  . Diabetes mellitus   . Hypertension   . CVA (cerebral infarction)   . GERD (gastroesophageal reflux disease)   . HLD (hyperlipidemia)   . Heart murmur   . Carotid artery occlusion 02/15/2009  . Stroke (HCC)   . Dementia     Family History  Problem Relation Age of Onset  . Diabetes  Mother   . Hypertension Mother   . Diabetes Father   . Hypertension Father   . Diabetes Sister   . Hypertension Sister     Social History   Social History  . Marital Status: Single    Spouse Name: N/A  . Number of Children: N/A  . Years of Education: N/A   Occupational History  . Not on file.   Social History Main Topics  . Smoking status: Former Smoker    Types: Cigarettes    Quit date: 07/20/1985  . Smokeless tobacco: Never Used  . Alcohol Use: No  . Drug Use: No  . Sexual Activity: No   Other Topics Concern  . Not on file   Social History Narrative    Past Surgical History  Procedure Laterality Date  . Appendectomy    . Tonsillectomy       Prescriptions prior to admission  Medication Sig Dispense Refill Last Dose  . allopurinol (ZYLOPRIM) 100 MG tablet Take 100 mg by mouth daily.   05/23/2015 at 0800  . amLODipine (NORVASC) 10 MG tablet Take 10 mg by mouth daily.   05/23/2015 at 0800  . aspirin 325 MG tablet Take 325 mg by mouth every morning.    05/23/2015 at 0800  . Calcium Carb-Cholecalciferol (CALCIUM 600+D3) 600-400 MG-UNIT TABS Take 1 tablet by mouth 2 (two) times daily with a meal.   05/23/2015 at 0800  . divalproex (DEPAKOTE) 250 MG DR tablet Take 250 mg by mouth 2 (two) times daily.   05/23/2015 at 2000  . furosemide (LASIX)  40 MG tablet Take 40 mg by mouth daily.   05/23/2015 at 0800  . gabapentin (NEURONTIN) 300 MG capsule Take 300 mg by mouth at bedtime.   05/23/2015 at 2000  . insulin aspart (NOVOLOG) 100 UNIT/ML injection Inject 7-10 Units into the skin 3 (three) times daily before meals. 7 units before breakfast, 10 units before lunch, and 10 units before supper   05/23/2015 at 2030  . insulin glargine (LANTUS) 100 UNIT/ML injection Inject 14 Units into the skin at bedtime.    05/23/2015 at 2000  . levothyroxine (SYNTHROID, LEVOTHROID) 88 MCG tablet Take 88 mcg by mouth daily before breakfast.   05/24/2015 at 0600  . lovastatin (MEVACOR) 20 MG tablet Take 20  mg by mouth every evening.    05/23/2015 at 2000  . meclizine (ANTIVERT) 25 MG tablet Take 1 tablet (25 mg total) by mouth 3 (three) times daily as needed for dizziness or nausea. 30 tablet 1 PRN  . metoCLOPramide (REGLAN) 5 MG tablet Take 5 mg by mouth 4 (four) times daily -  before meals and at bedtime.    05/24/2015 at 0630  . metoprolol tartrate (LOPRESSOR) 25 MG tablet Take 25 mg by mouth 2 (two) times daily.   05/23/2015 at 2000  . omeprazole (PRILOSEC) 20 MG capsule Take 20 mg by mouth 2 (two) times daily.   05/24/2015 at 0700  . potassium chloride (K-DUR) 10 MEQ tablet Take 10 mEq by mouth daily. With furosemide   05/23/2015 at 0800  . QUEtiapine (SEROQUEL) 25 MG tablet Take 25 mg by mouth at bedtime.   05/23/2015 at 2000  . traMADol-acetaminophen (ULTRACET) 37.5-325 MG tablet Take 1 tablet by mouth every 8 (eight) hours as needed for moderate pain or severe pain.   PRN    Physical Exam: Blood pressure 132/69, pulse 77, temperature 98.1 F (36.7 C), temperature source Oral, resp. rate 19, height  (1.448 m), weight 67.132 kg (148 lb), SpO2 97 %.    General appearance: slowed mentation Resp: clear to auscultation bilaterally Cardio: regular rate and rhythm GI: soft, non-tender; bowel sounds normal; no masses,  no organomegaly Neurologic: Mental status: Alert, oriented, thought content appropriate, alertness: lethargic, orientation: not oriented Labs:   Lab Results  Component Value Date   WBC 12.5* 05/25/2015   HGB 11.2* 05/25/2015   HCT 34.9* 05/25/2015   MCV 87.9 05/25/2015   PLT 166 05/25/2015    Recent Labs Lab 05/24/15 0753  05/25/15 0233  NA 140  --  141  K 3.9  --  3.5  CL 104  --  109  CO2 29  --  28  BUN 19  --  17  CREATININE 1.64*  < > 1.43*  CALCIUM 8.7*  --  8.5*  PROT 6.6  --   --   BILITOT 0.4  --   --   ALKPHOS 65  --   --   ALT 11*  --   --   AST 21  --   --   GLUCOSE 151*  --  196*  < > = values in this interval not displayed. Lab Results   Component Value Date   CKTOTAL 70 09/23/2011   CKMB 1.9 09/23/2011   TROPONINI 0.41* 05/25/2015      Radiology: no acute cariopulmonary disease on cxr. No acute change on head ct or mir EKG: nsr with no ischemia  ASSESSMENT AND PLAN:  Pt with history of cva and dementia who was admitted with altered mental status.  Note incidentally to have mild troponin elevation. Also has uti. EF is normal. Does not appear to have had an acute coronary event as the etiology of her event. Likely altered mental status is secondary to uti. Would continue to treat uti. Not a candidate for invasive cardiac evaluation or anticoagulation. Further cardiaac workup would depend on improvement after trement of uti.  Signed: Dalia Heading MD, [facility] 05/25/2015, 9:03 AM

## 2015-05-26 ENCOUNTER — Inpatient Hospital Stay: Payer: Medicare Other

## 2015-05-26 LAB — BASIC METABOLIC PANEL
Anion gap: 13 (ref 5–15)
BUN: 17 mg/dL (ref 6–20)
CO2: 25 mmol/L (ref 22–32)
CREATININE: 1.21 mg/dL — AB (ref 0.44–1.00)
Calcium: 9.5 mg/dL (ref 8.9–10.3)
Chloride: 102 mmol/L (ref 101–111)
GFR calc Af Amer: 50 mL/min — ABNORMAL LOW (ref >60)
GFR, EST NON AFRICAN AMERICAN: 43 mL/min — AB (ref >60)
Glucose, Bld: 333 mg/dL — ABNORMAL HIGH (ref 65–99)
Potassium: 3.8 mmol/L (ref 3.5–5.1)
SODIUM: 140 mmol/L (ref 135–145)

## 2015-05-26 LAB — URINE CULTURE

## 2015-05-26 LAB — GLUCOSE, CAPILLARY
GLUCOSE-CAPILLARY: 337 mg/dL — AB (ref 65–99)
Glucose-Capillary: 302 mg/dL — ABNORMAL HIGH (ref 65–99)
Glucose-Capillary: 347 mg/dL — ABNORMAL HIGH (ref 65–99)
Glucose-Capillary: 270 mg/dL — ABNORMAL HIGH (ref 65–99)

## 2015-05-26 LAB — CBC
HCT: 46.1 % (ref 35.0–47.0)
Hemoglobin: 14.8 g/dL (ref 12.0–16.0)
MCH: 28.4 pg (ref 26.0–34.0)
MCHC: 32.2 g/dL (ref 32.0–36.0)
MCV: 88.1 fL (ref 80.0–100.0)
PLATELETS: 195 10*3/uL (ref 150–440)
RBC: 5.23 MIL/uL — ABNORMAL HIGH (ref 3.80–5.20)
RDW: 15.7 % — ABNORMAL HIGH (ref 11.5–14.5)
WBC: 9.6 10*3/uL (ref 3.6–11.0)

## 2015-05-26 MED ORDER — AMLODIPINE BESYLATE 5 MG PO TABS
5.0000 mg | ORAL_TABLET | Freq: Every day | ORAL | Status: DC
  Administered 2015-05-27: 5 mg via ORAL
  Filled 2015-05-26 (×2): qty 1

## 2015-05-26 MED ORDER — CLONIDINE HCL 0.1 MG/24HR TD PTWK
0.1000 mg | MEDICATED_PATCH | Freq: Once | TRANSDERMAL | Status: DC
  Administered 2015-05-26: 0.1 mg via TRANSDERMAL
  Filled 2015-05-26: qty 1

## 2015-05-26 MED ORDER — ONDANSETRON HCL 4 MG/2ML IJ SOLN
4.0000 mg | Freq: Four times a day (QID) | INTRAMUSCULAR | Status: DC
  Administered 2015-05-26 – 2015-05-28 (×6): 4 mg via INTRAVENOUS
  Filled 2015-05-26 (×6): qty 2

## 2015-05-26 MED ORDER — LABETALOL HCL 5 MG/ML IV SOLN
10.0000 mg | Freq: Once | INTRAVENOUS | Status: AC
  Administered 2015-05-26: 10 mg via INTRAVENOUS
  Filled 2015-05-26: qty 4

## 2015-05-26 MED ORDER — INFLUENZA VAC SPLIT QUAD 0.5 ML IM SUSY
0.5000 mL | PREFILLED_SYRINGE | INTRAMUSCULAR | Status: AC
  Administered 2015-05-27: 0.5 mL via INTRAMUSCULAR
  Filled 2015-05-26: qty 0.5

## 2015-05-26 MED ORDER — INSULIN GLARGINE 100 UNIT/ML ~~LOC~~ SOLN
20.0000 [IU] | Freq: Every day | SUBCUTANEOUS | Status: DC
  Administered 2015-05-26: 20 [IU] via SUBCUTANEOUS
  Filled 2015-05-26 (×2): qty 0.2

## 2015-05-26 NOTE — Clinical Social Work Note (Signed)
Clinical Social Work Assessment  Patient Details  Name: [name] [name] MRN: [id number] Date of Birth: 1941/02/18  Date of referral:  05/26/15               Reason for consult:  Facility Placement, Other (Comment Required) (From [facility]. )                Permission sought to share information with:  Chartered certified accountant granted to share information::  Yes, Verbal Permission Granted  Name::      [name] [name]::   [name] [name]   Relationship::     Contact Information:     Housing/Transportation Living arrangements for the past 2 months:  [place] of Information:  [name] [name], Adult Children Patient Interpreter Needed:  None Criminal Activity/Legal Involvement Pertinent to Current Situation/Hospitalization:  No - Comment as needed Significant Relationships:  Adult Children Lives with:  Facility Resident Do you feel safe going back to the place where you live?  Yes Need for family participation in patient care:  Yes (Comment)  Care giving concerns: Patient is a resident at [facility].    Social Worker assessment / plan: Holiday representative ([name] [name]) met with patient and her daughter [name] [name] (Marin) [phone], son in law and grandson were at bedside. Patient was asleep during assessment. CSW introduced self and explained role of CSW department. Daughter reported that patient has been at [facility] for over a year now. Daughter is concerned that patient has been declining for the past 2 months and may need to go to SNF for short term. CSW explained that patient will need a 3 night qualifying inpatient stay in order for Medicare to pay for rehab. Tonight 05/26/15 is patient's 3rd night. Daughter is agreeable to SNF search in [facility] and does not want [facility]. Daughter reported that patient has been to [name] before and may consider going there.   FL2 complete and faxed out.  CSW will continue to follow and assist as needed.   Employment status:  Retired Forensic scientist:  Information systems manager, [employer] PT Recommendations:  [place] / Referral to community resources:  [name] [name]  Patient/Family's Response to care: Daughter is agreeable to [facility] in [place].   Patient/Family's Understanding of and Emotional Response to Diagnosis, Current Treatment, and Prognosis: Daughter was pleasant throughout assessment and thanked CSW for visit.   Emotional Assessment Appearance:  Appears stated age Attitude/Demeanor/Rapport:  Unable to Assess Affect (typically observed):  Unable to Assess Orientation:  Oriented to Self, Fluctuating Orientation (Suspected and/or reported Sundowners) Alcohol / Substance use:  Not Applicable Psych involvement (Current and /or in the community):  No (Comment)  Discharge Needs  Concerns to be addressed:  Discharge Planning Concerns Readmission within the last 30 days:  No Current discharge risk:  Cognitively Impaired, Dependent with Mobility Barriers to Discharge:  Continued Medical Work up   [name] Freshwater, LCSW 05/26/2015, 4:26 PM

## 2015-05-26 NOTE — Evaluation (Signed)
Physical Therapy Evaluation Patient Details Name: [name] CalamitySophie A [name] MRN: [id number] DOB: 17-Dec-1940 Today's Date: 05/26/2015   History of Present Illness  Pt here with AMS, sepsis  Clinical Impression   Pt is very limited with how much she is able to participate.  She is not able to do very much in the way of AROM and is not at all able to maintain sitting balance. She is not able to answer questions and is largely distant t/o the exam.  She has been having high blood sugars as well as troponin (though this is trending down).   Will put pt on a 3 day trial basis dependent on her ability to meaningfully participate.     Follow Up Recommendations SNF    Equipment Recommendations       Recommendations for Other Services       Precautions / Restrictions Precautions Precautions: Fall Restrictions Weight Bearing Restrictions: No      Mobility  Bed Mobility Overal bed mobility: Needs Assistance Bed Mobility: Supine to Sit;Sit to Supine     Supine to sit: Total assist Sit to supine: Total assist   General bed mobility comments: Pt did poorly with attempts at sitting and is unable to keep herself in sitting w/o at least mod assist.  She showed essentially no righting reaction and was unable to use UEs effectively to maintain balance.   Transfers                 General transfer comment: unable  Ambulation/Gait                Stairs            Wheelchair Mobility    Modified Rankin (Stroke Patients Only)       Balance                                             Pertinent Vitals/Pain Pain Assessment:  (unable to rate, does not indicate pain with movement)    Home Living Family/patient expects to be discharged to::  (unable to answer, she is from an ALF)               Home Equipment: Walker - 2 wheels      Prior Function           Comments: Apparently she was ambulatory with a walker and was able to be relatively  active     Hand Dominance        Extremity/Trunk Assessment   Upper Extremity Assessment: RUE deficits/detail RUE Deficits / Details: Pt with essentially no R UE AROM, limited AROM on L with heavy cuing/assist         Lower Extremity Assessment: LLE deficits/detail   LLE Deficits / Details: essenitally no AROM on R and very limited AROM on L.  Pt's ankles appear to lack DF to neutral and she is generally unaware with AAROM assist to test strength     Communication   Communication:  (essentially non-verbal, able to very weakly say her name)  Cognition Arousal/Alertness: Lethargic Behavior During Therapy: Flat affect Overall Cognitive Status: Impaired/Different from baseline                      General Comments      Exercises        Assessment/Plan    PT  Assessment Patient needs continued PT services (per ability to participate)  PT Diagnosis Difficulty walking;Generalized weakness;Altered mental status   PT Problem List Decreased strength;Decreased range of motion;Decreased activity tolerance;Decreased balance;Decreased mobility;Decreased coordination;Decreased cognition;Decreased safety awareness;Decreased knowledge of use of DME  PT Treatment Interventions DME instruction;Gait training;Functional mobility training;Stair training;Therapeutic activities;Therapeutic exercise;Balance training;Neuromuscular re-education   PT Goals (Current goals can be found in the Care Plan section) Acute Rehab PT Goals Patient Stated Goal: unable to state PT Goal Formulation: Patient unable to participate in goal setting Time For Goal Achievement: 06/09/15 Potential to Achieve Goals: Poor    Frequency Min 2X/week (3 day trial per progress)   Barriers to discharge        Co-evaluation               End of Session                 Time: 8413-2440 PT Time Calculation (min) (ACUTE ONLY): 15 min   Charges:   PT Evaluation $Initial PT Evaluation Tier I: 1  Procedure     PT G Codes:       Loran Senters, PT, DPT [phone]  Malachi Pro 05/26/2015, 2:04 PM

## 2015-05-26 NOTE — Progress Notes (Signed)
[facility] [place] at [facility]   PATIENT NAME: [name] [name]    MR#:  [id number]  DATE OF BIRTH:  1941/06/10  SUBJECTIVE:  CHIEF COMPLAINT:   Chief Complaint  Patient presents with  . Altered Mental Status   - More sleepy this morning. Had 3 episodes of emesis last night and received medications. -Blood pressure was elevated all night  REVIEW OF SYSTEMS:  Review of Systems  Constitutional: Negative for fever and chills.  HENT: Negative for ear discharge and ear pain.   Eyes: Negative for blurred vision and double vision.  Respiratory: Negative for cough, shortness of breath and wheezing.   Cardiovascular: Negative for chest pain, palpitations and leg swelling.  Gastrointestinal: Negative for nausea, vomiting, abdominal pain, diarrhea and constipation.  Genitourinary: Negative for dysuria.  Musculoskeletal: Negative for myalgias.  Neurological: Positive for focal weakness. Negative for dizziness, speech change, seizures and headaches.       Right hand weakness which is chronic from previous stroke  Psychiatric/Behavioral: Negative for depression.    DRUG ALLERGIES:   Allergies  Allergen Reactions  . Fexofenadine Hcl Rash    VITALS:  Blood pressure 142/76, pulse 101, temperature 99.5 F (37.5 C), temperature source Oral, resp. rate 16, height  (1.448 m), weight 67.132 kg (148 lb), SpO2 97 %.  PHYSICAL EXAMINATION:  Physical Exam  GENERAL:  74 y.o.-year-old patient lying in the bed with no acute distress.  EYES: Ptosis of right eyelid noted. Pupils equal, round, reactive to light and accommodation. No scleral icterus. Extraocular muscles intact.  HEENT: Head atraumatic, normocephalic. Oropharynx and nasopharynx clear.  NECK:  Supple, no jugular venous distention. No thyroid enlargement, no tenderness.  LUNGS: Normal breath sounds bilaterally, no wheezing, rales,rhonchi or crepitation. No use of accessory muscles of respiration.   CARDIOVASCULAR: S1, S2 normal. No  rubs, or gallops. G/6 systolic murmur is present ABDOMEN: Soft, nontender, nondistended. Bowel sounds present. No organomegaly or mass.  EXTREMITIES: No pedal edema, cyanosis, or clubbing.  NEUROLOGIC: Cranial nerves II through XII are intact. Ptosis of right eyelid noted. Right hand placed like flexion deformity at the wrist. However was following commands she has good hand grip but the strength of right upper extremity is noted to be 4/5. Patient is left-handed according to her. Left upper extremity strength is 5/ 5. Bilateral lower extremities are 4/5 in bed. Sensation intact. Gait not checked.  PSYCHIATRIC: The patient is alert and pleasantly confused.  SKIN: No obvious rash, lesion, or ulcer.    LABORATORY PANEL:   CBC  Recent Labs Lab 05/26/15 0237  WBC 9.6  HGB 14.8  HCT 46.1  PLT 195   ------------------------------------------------------------------------------------------------------------------  Chemistries   Recent Labs Lab 05/24/15 0753  05/26/15 0237  NA 140  < > 140  K 3.9  < > 3.8  CL 104  < > 102  CO2 29  < > 25  GLUCOSE 151*  < > 333*  BUN 19  < > 17  CREATININE 1.64*  < > 1.21*  CALCIUM 8.7*  < > 9.5  AST 21  --   --   ALT 11*  --   --   ALKPHOS 65  --   --   BILITOT 0.4  --   --   < > = values in this interval not displayed. ------------------------------------------------------------------------------------------------------------------  Cardiac Enzymes  Recent Labs Lab 05/25/15 0233  TROPONINI 0.41*   ------------------------------------------------------------------------------------------------------------------  RADIOLOGY:  Mr Brain Wo Contrast  05/24/2015  CLINICAL  DATA:  74 year old female with altered mental status. History of prior infarct. Lethargy, fever, hypotension. Initial encounter. EXAM: MRI HEAD WITHOUT CONTRAST TECHNIQUE: Multiplanar, multiecho pulse sequences of the brain and surrounding  structures were obtained without intravenous contrast. COMPARISON:  Head CT without contrast 0821 hours today. Brain MRI 05/19/2013. FINDINGS: Study is intermittently degraded by motion artifact despite repeated imaging attempts. No restricted diffusion or evidence of acute infarction. Major intracranial vascular flow voids are stable, with absence of the right ICA siphon and terminus flow signal. Widespread chronic infarcts throughout both cerebellar hemispheres. Widespread right hemisphere encephalomalacia, sparing the right ACA territory and right deep gray matter nuclei. Left corona radiata chronic lacunar infarct with confluent T2 and FLAIR hyperintensity. No ventriculomegaly. No acute intracranial mass effect. Cerebral volume appears stable. Pituitary and cervicomedullary junction grossly normal. Normal bone marrow signal. Mastoids and paranasal sinuses appear clear. Orbit and scalp soft tissues appear stable. IMPRESSION: 1.  No acute intracranial abnormality identify. 2. Very advanced chronic ischemic disease, stable since 2014. Electronically Signed   By: Odessa FlemingH  Hall M.D.   On: 05/24/2015 14:55    EKG:   Orders placed or performed during the hospital encounter of 05/24/15  . ED EKG  . ED EKG    ASSESSMENT AND PLAN:   74 year old female with history of dementia, diabetes, hypertension, history of CVA with minimal right-sided weakness presents from [facility] with altered mental status.  #1 altered mental status- metabolic encephalopathy from sepsis. -Patient has underlying dementia and also urinary tract infection. -Continue gentle hydration. IV antibiotics and continue to monitor. -CT of the head negative for any acute intracranial findings.  #2 sepsis-secondary to urinary tract infection. -Blood and urine cultures are gram-negative rods-likely E.coli, As noted on urine cultures -Continue Rocephin for now. Change to Keflex once blood cultures are resulted  #3  malignant hypertension-likely cause of her emesis last night. -She is on metoprolol, added Norvasc today  #4 dementia with depression-mental status seems to be improving. -Continue her Seroquel. Also on Depakote. -Home pain medications have been restarted  #5 diabetes mellitus-on Lantus and sliding scale insulin. Increase Lantus dose as sugars have been elevated  #6 history of CVA-residual right-sided weakness. -MRI with old left coronary data infarcts. No acute infarct noted. -Continue to monitor at this time. Physical therapy consulted. Patient at baseline ambulates without any trouble.  #7 DVT prophylaxis - on subcutaneous heparin  #8 elevated troponin-likely demand ischemia from her sepsis. No evidence of any acute MI Troponin level has stabilized  Physical therapy consult requested   All the records are reviewed and case discussed with Care Management/Social Workerr. Management plans discussed with the patient, family and they are in agreement.  CODE STATUS: Full code  TOTAL TIME TAKING CARE OF THIS PATIENT: 38 minutes.   POSSIBLE D/C IN 2-3 DAYS, DEPENDING ON CLINICAL CONDITION.   Enid BaasKALISETTI,[name] [name] M.D on 05/26/2015 at 9:40 AM  Between 7am to 6pm - Pager - [phone]  After 6pm go to [email or web address] - password [email or web address] [facility]  [facility]  Office  [phone]  CC: Primary care physician; No PCP Per Patient

## 2015-05-26 NOTE — [place] FL2 (Signed)
[place] MEDICAID FL2 LEVEL OF CARE SCREENING TOOL     IDENTIFICATION  Patient Name: [name] [name] Birthdate: Oct 05, 1940 Sex: female Admission Date (Current Location): 05/24/2015  [place] and [place] Number:  [facility] )  ([id number]) Facility and Address:  [facility], [address] [place], [place] [address]      Provider Number: [id number] [phone])  Attending Physician Name and Address:  Enid Baas, MD  Relative Name and Phone Number:       Current Level of Care: Hospital Recommended Level of Care: Skilled Nursing Facility Prior Approval Number:    Date Approved/Denied:   PASRR Number:  ( [id number] )  Discharge Plan: SNF    Current Diagnoses: Patient Active Problem List   Diagnosis Date Noted  . Altered mental status 05/24/2015  . NSTEMI (non-ST elevated myocardial infarction) (HCC) 05/24/2015  . Occlusion and stenosis of carotid artery without mention of cerebral infarction 10/08/2011  . CKD (chronic kidney disease) 09/25/2011  . Nausea and vomiting 09/22/2011  . HTN (hypertension) 09/22/2011  . H/O: CVA (cardiovascular accident) 09/22/2011  . Diabetes mellitus, type 2 (HCC) 09/22/2011    Orientation ACTIVITIES/SOCIAL BLADDER RESPIRATION    Self  Active Incontinent Normal  BEHAVIORAL SYMPTOMS/MOOD NEUROLOGICAL BOWEL NUTRITION STATUS   (none)  (none ) Incontinent Diet (Clear Liquid )  PHYSICIAN VISITS COMMUNICATION OF NEEDS Height & Weight Skin  30 days Verbally  (144.8 cm) 148 lbs. Normal          AMBULATORY STATUS RESPIRATION    Assist extensive Normal      Personal Care Assistance Level of Assistance  Bathing, Feeding, Dressing Bathing Assistance: Limited assistance Feeding assistance: Independent Dressing Assistance: Limited assistance      Functional Limitations Info  Sight, Hearing, Speech Sight Info: Adequate Hearing Info: Adequate Speech Info: Adequate       SPECIAL CARE  FACTORS FREQUENCY  PT (By licensed PT)     PT Frequency:  (5)             Additional Factors Info  Code Status, Allergies, Insulin Sliding Scale Code Status Info:  (Full Code. ) Allergies Info:  (Fexofenadine Hcl)   Insulin Sliding Scale Info:  (Novolog insulin injection at bedtime. )       Current Medications (05/26/2015): Current Facility-Administered Medications  Medication Dose Route Frequency Provider Last Rate Last Dose  . acetaminophen (TYLENOL) tablet 650 mg  650 mg Oral Q6H PRN Gale Journey, MD       Or  . acetaminophen (TYLENOL) suppository 650 mg  650 mg Rectal Q6H PRN Gale Journey, MD      . allopurinol (ZYLOPRIM) tablet 100 mg  100 mg Oral Daily Gale Journey, MD   100 mg at 05/25/15 0913  . amLODipine (NORVASC) tablet 5 mg  5 mg Oral Daily Enid Baas, MD   5 mg at 05/26/15 1010  . aspirin tablet 325 mg  325 mg Oral BH-q7a Gale Journey, MD   325 mg at 05/26/15 2536  . cefTRIAXone (ROCEPHIN) 2 g in dextrose 5 % 50 mL IVPB  2 g Intravenous Q24H Enid Baas, MD   2 g at 05/26/15 1544  . cloNIDine (CATAPRES - Dosed in mg/24 hr) patch 0.1 mg  0.1 mg Transdermal Once Enid Baas, MD   0.1 mg at 05/26/15 1220  . divalproex (DEPAKOTE) DR tablet 250 mg  250 mg Oral Q12H Enid Baas, MD   250 mg at 05/25/15 2230  . heparin injection  5,000 Units  5,000 Units Subcutaneous 3 times per day Gale Journeyatherine P Walsh, MD   5,000 Units at 05/26/15 1432  . [START ON 05/27/2015] Influenza vac split quadrivalent PF (FLUARIX) injection 0.5 mL  0.5 mL Intramuscular Tomorrow-1000 Enid Baasadhika Shonice Wrisley, MD      . insulin aspart (novoLOG) injection 0-5 Units  0-5 Units Subcutaneous QHS Gale Journeyatherine P Walsh, MD   2 Units at 05/25/15 2229  . insulin aspart (novoLOG) injection 0-9 Units  0-9 Units Subcutaneous TID WC Gale Journeyatherine P Walsh, MD   7 Units at 05/26/15 1221  . insulin glargine (LANTUS) injection 20 Units  20 Units Subcutaneous QHS Enid Baasadhika Nathalie Cavendish, MD      .  levothyroxine (SYNTHROID, LEVOTHROID) tablet 88 mcg  88 mcg Oral QAC breakfast Gale Journeyatherine P Walsh, MD   88 mcg at 05/26/15 [id number]  . meclizine (ANTIVERT) tablet 25 mg  25 mg Oral TID PRN Gale Journeyatherine P Walsh, MD      . metoprolol tartrate (LOPRESSOR) tablet 25 mg  25 mg Oral BID Gale Journeyatherine P Walsh, MD   25 mg at 05/25/15 2230  . ondansetron (ZOFRAN) tablet 4 mg  4 mg Oral Q6H PRN Gale Journeyatherine P Walsh, MD       Or  . ondansetron [facility](ZOFRAN) injection 4 mg  4 mg Intravenous Q6H PRN Gale Journeyatherine P Walsh, MD   4 mg at 05/26/15 [id number]  . ondansetron (ZOFRAN) injection 4 mg  4 mg Intravenous 4 times per day Enid Baasadhika Xzander Gilham, MD   4 mg at 05/26/15 1432  . pantoprazole (PROTONIX) EC tablet 40 mg  40 mg Oral QAC breakfast Gale Journeyatherine P Walsh, MD   40 mg at 05/26/15 [id number]  . polyethylene glycol (MIRALAX / GLYCOLAX) packet 17 g  17 g Oral Daily PRN Gale Journeyatherine P Walsh, MD      . pravastatin (PRAVACHOL) tablet 40 mg  40 mg Oral q1800 Gale Journeyatherine P Walsh, MD   40 mg at 05/25/15 1706  . QUEtiapine (SEROQUEL) tablet 25 mg  25 mg Oral QHS Gale Journeyatherine P Walsh, MD   25 mg at 05/25/15 2230  . sodium chloride 0.9 % injection 3 mL  3 mL Intravenous Q12H Gale Journeyatherine P Walsh, MD   3 mL at 05/26/15 1012  . traMADol-acetaminophen (ULTRACET) 37.5-325 MG per tablet 1 tablet  1 tablet Oral Q8H PRN Enid Baasadhika Yuvraj Pfeifer, MD       Do not use this list as official medication orders. Please verify with discharge summary.  Discharge Medications:   Medication List    ASK your doctor about these medications        allopurinol 100 MG tablet  Commonly known as:  ZYLOPRIM  Take 100 mg by mouth daily.     amLODipine 10 MG tablet  Commonly known as:  NORVASC  Take 10 mg by mouth daily.     aspirin 325 MG tablet  Take 325 mg by mouth every morning.     CALCIUM 600+D3 600-400 MG-UNIT Tabs  Generic drug:  Calcium Carb-Cholecalciferol  Take 1 tablet by mouth 2 (two) times daily with a meal.     divalproex 250 MG DR tablet  Commonly known as:  DEPAKOTE   Take 250 mg by mouth 2 (two) times daily.     furosemide 40 MG tablet  Commonly known as:  LASIX  Take 40 mg by mouth daily.     gabapentin 300 MG capsule  Commonly known as:  NEURONTIN  Take 300 mg by mouth at bedtime.     insulin aspart 100  UNIT/ML injection  Commonly known as:  novoLOG  Inject 7-10 Units into the skin 3 (three) times daily before meals. 7 units before breakfast, 10 units before lunch, and 10 units before supper     insulin glargine 100 UNIT/ML injection  Commonly known as:  LANTUS  Inject 14 Units into the skin at bedtime.     levothyroxine 88 MCG tablet  Commonly known as:  SYNTHROID, LEVOTHROID  Take 88 mcg by mouth daily before breakfast.     lovastatin 20 MG tablet  Commonly known as:  MEVACOR  Take 20 mg by mouth every evening.     meclizine 25 MG tablet  Commonly known as:  ANTIVERT  Take 1 tablet (25 mg total) by mouth 3 (three) times daily as needed for dizziness or nausea.     metoCLOPramide 5 MG tablet  Commonly known as:  REGLAN  Take 5 mg by mouth 4 (four) times daily -  before meals and at bedtime.     metoprolol tartrate 25 MG tablet  Commonly known as:  LOPRESSOR  Take 25 mg by mouth 2 (two) times daily.     omeprazole 20 MG capsule  Commonly known as:  PRILOSEC  Take 20 mg by mouth 2 (two) times daily.     potassium chloride 10 MEQ tablet  Commonly known as:  K-DUR  Take 10 mEq by mouth daily. With furosemide     QUEtiapine 25 MG tablet  Commonly known as:  SEROQUEL  Take 25 mg by mouth at bedtime.     traMADol-acetaminophen 37.5-325 MG tablet  Commonly known as:  ULTRACET  Take 1 tablet by mouth every 8 (eight) hours as needed for moderate pain or severe pain.        Relevant Imaging Results:  Relevant Lab Results:  Recent Labs    Additional Information  (SSN: [id number])  Haig Prophet, LCSW

## 2015-05-26 NOTE — Clinical Social Work Placement (Signed)
   CLINICAL SOCIAL WORK PLACEMENT  NOTE  Date:  05/26/2015  Patient Details  Name: [name] [name] MRN: [id number] Date of Birth: 1941/02/19  Clinical Social Work is seeking post-discharge placement for this patient at the Skilled  Nursing Facility level of care (*CSW will initial, date and re-position this form in  chart as items are completed):  Yes   Patient/family provided with [facility] Clinical Social Work Department's list of facilities offering this level of care within the geographic area requested by the patient (or if unable, by the patient's family).  Yes   Patient/family informed of their freedom to choose among providers that offer the needed level of care, that participate in Medicare, Medicaid or managed care program needed by the patient, have an available bed and are willing to accept the patient.  Yes   Patient/family informed of [facility]'s ownership interest in [facility] and [facility], as well as of the fact that they are under no obligation to receive care at these facilities.  PASRR submitted to EDS on       PASRR number received on       Existing PASRR number confirmed on 05/26/15     FL2 transmitted to all facilities in geographic area requested by pt/family on 05/26/15     FL2 transmitted to all facilities within larger geographic area on       Patient informed that his/her managed care company has contracts with or will negotiate with certain facilities, including the following:            Patient/family informed of bed offers received.  Patient chooses bed at       Physician recommends and patient chooses bed at      Patient to be transferred to   on  .  Patient to be transferred to facility by       Patient family notified on   of transfer.  Name of family member notified:        PHYSICIAN Please sign FL2     Additional Comment:    _______________________________________________ Haig ProphetMorgan, Willard Farquharson G, LCSW 05/26/2015, 4:25  PM

## 2015-05-26 NOTE — Progress Notes (Signed)
Pt's BP 195/89 this morning, MD, Sheryle Hailiamond notified, order for labetalol IV 10 mg once received. Monitor for allergic reaction, per MD.

## 2015-05-26 NOTE — Progress Notes (Addendum)
Pt had 3 emesis episodes on shift, Zofran PRN administered. Will continue to monitor.

## 2015-05-27 DIAGNOSIS — A4151 Sepsis due to Escherichia coli [E. coli]: Secondary | ICD-10-CM | POA: Diagnosis not present

## 2015-05-27 DIAGNOSIS — R404 Transient alteration of awareness: Secondary | ICD-10-CM

## 2015-05-27 LAB — GLUCOSE, CAPILLARY
GLUCOSE-CAPILLARY: 164 mg/dL — AB (ref 65–99)
Glucose-Capillary: 276 mg/dL — ABNORMAL HIGH (ref 65–99)
Glucose-Capillary: 259 mg/dL — ABNORMAL HIGH (ref 65–99)
Glucose-Capillary: 212 mg/dL — ABNORMAL HIGH (ref 65–99)

## 2015-05-27 MED ORDER — INSULIN GLARGINE 100 UNIT/ML ~~LOC~~ SOLN
25.0000 [IU] | Freq: Every day | SUBCUTANEOUS | Status: DC
  Administered 2015-05-27 – 2015-05-29 (×2): 25 [IU] via SUBCUTANEOUS
  Filled 2015-05-27 (×4): qty 0.25

## 2015-05-27 MED ORDER — LEVOFLOXACIN 250 MG PO TABS
250.0000 mg | ORAL_TABLET | Freq: Every day | ORAL | Status: DC
  Administered 2015-05-27 – 2015-05-30 (×4): 250 mg via ORAL
  Filled 2015-05-27 (×4): qty 1

## 2015-05-27 MED ORDER — INSULIN ASPART 100 UNIT/ML ~~LOC~~ SOLN
0.0000 [IU] | Freq: Three times a day (TID) | SUBCUTANEOUS | Status: DC
  Administered 2015-05-27 – 2015-05-28 (×2): 3 [IU] via SUBCUTANEOUS
  Administered 2015-05-28 – 2015-05-29 (×2): 5 [IU] via SUBCUTANEOUS
  Administered 2015-05-29: 3 [IU] via SUBCUTANEOUS
  Administered 2015-05-30: 5 [IU] via SUBCUTANEOUS
  Administered 2015-05-30: 2 [IU] via SUBCUTANEOUS
  Administered 2015-05-31: 5 [IU] via SUBCUTANEOUS
  Filled 2015-05-27: qty 3
  Filled 2015-05-27 (×2): qty 5
  Filled 2015-05-27: qty 3
  Filled 2015-05-27: qty 5
  Filled 2015-05-27: qty 3
  Filled 2015-05-27: qty 2
  Filled 2015-05-27: qty 5

## 2015-05-27 MED ORDER — ENOXAPARIN SODIUM 40 MG/0.4ML ~~LOC~~ SOLN
40.0000 mg | SUBCUTANEOUS | Status: DC
  Administered 2015-05-27 – 2015-05-28 (×2): 40 mg via SUBCUTANEOUS
  Filled 2015-05-27 (×2): qty 0.4

## 2015-05-27 MED ORDER — AMLODIPINE BESYLATE 10 MG PO TABS
10.0000 mg | ORAL_TABLET | Freq: Every day | ORAL | Status: DC
  Administered 2015-05-28 – 2015-05-31 (×4): 10 mg via ORAL
  Filled 2015-05-27 (×4): qty 1

## 2015-05-27 NOTE — Progress Notes (Signed)
Physical Therapy Treatment Patient Details Name: [name] [name] MRN: [id number] DOB: 1940-11-03 Today's Date: 05/27/2015    History of Present Illness Pt here with AMS, sepsis related to UTI    PT Comments    Patient remains non-verbal throughout session, but does display ability to respond to simple, one-step commands approx 75% time when allowed significant increase in time (to process and complete).  Demonstrates ability to actively mobilize bilat LEs with at least 3-/5 strength this date, though noted with very heavy posterior trunk lean with all upright positions.  Sitting balance improves with training, but unable to maintain with additional functional activities of fatigue. Will continue to benefit from skilled PT activities to promote recovery of functional mobility as medical status improves.   Follow Up Recommendations  SNF     Equipment Recommendations       Recommendations for Other Services       Precautions / Restrictions Precautions Precautions: Fall Restrictions Weight Bearing Restrictions: No    Mobility  Bed Mobility Overal bed mobility: Needs Assistance Bed Mobility: Supine to Sit;Sit to Supine     Supine to sit: Max assist Sit to supine: Total assist;+2 for physical assistance   General bed mobility comments: patient able to self-assist with bilat UEs, but requires full support for truncal elevation  Transfers Overall transfer level: Needs assistance Equipment used: 2 person hand held assist Transfers: Sit to/from Stand Sit to Stand: Mod assist;Max assist;+2 physical assistance         General transfer comment: maintains forward flexed posture with heavy posterior trunk lean/weight shift; very fearful and resistant to attempts at facilitation by therapist  Ambulation/Gait             General Gait Details: unable/unsafe   Stairs            Wheelchair Mobility    Modified Rankin (Stroke Patients Only)       Balance  Overall balance assessment: Needs assistance Sitting-balance support: No upper extremity supported;Feet supported Sitting balance-Leahy Scale: Poor Sitting balance - Comments: initially, max assist for unsupported sitting; improves to cga/min assist with training     Standing balance-Leahy Scale: Poor Standing balance comment: very heavy posterior trunk lean/weight shift; very fearful of attempted correction by therapist                    Cognition Arousal/Alertness: Lethargic Behavior During Therapy: Flat affect Overall Cognitive Status: Impaired/Different from baseline (patient non-verbal throughout session; intermittently follows 75% simple, one-step commands with significant increase in time for processing)                      Exercises Other Exercises Other Exercises: Unsupported sitting balance, max assist initially for heavy posterior weight shift.  Positioned elevated hospital table anterior to patient with bilat UEs propped on surface (to promote forward trunk lean, lumbar ext/ant pelvic tilt) to facilitate improved anterior weight translation.  Progressed to include L UE dynamic reaching to further enhance forward weight shift; requiring hand-over-hand assist 50% time.  Very delayed processing.  With accommodation to position, able to maintain position with min assist (when UEs and environmental support removed), but unable to integrate additional activity into position. Other Exercises: Sit/stand x3 with bilat HHA, mod/max assist +2 for safety.   Able to clear buttocks from bed surface, but unable to achieve full stance due to heavy posterior weight shfit; patietn physically resisting weight shifting or balance correction attempts.     General Comments  Pertinent Vitals/Pain Pain Assessment: No/denies pain    Home Living                      Prior Function            PT Goals (current goals can now be found in the care plan section) Acute  Rehab PT Goals Patient Stated Goal: unable to state PT Goal Formulation: Patient unable to participate in goal setting Time For Goal Achievement: 06/09/15 Potential to Achieve Goals: Fair Progress towards PT goals: Progressing toward goals    Frequency  Min 2X/week    PT Plan Current plan remains appropriate    Co-evaluation             End of Session Equipment Utilized During Treatment: Gait belt Activity Tolerance: Patient tolerated treatment well Patient left: in bed;with call bell/phone within reach (CNA present for hygiene; to connect alarm when complete)     Time: 1610-9604 PT Time Calculation (min) (ACUTE ONLY): 23 min  Charges:  $Therapeutic Activity: 8-22 mins $Neuromuscular Re-education: 8-22 mins                    G Codes:      Luis Nickles H. Manson Passey, PT, DPT, NCS 05/27/2015, 1:46 PM [phone]

## 2015-05-27 NOTE — Progress Notes (Signed)
Patient resting in bed at this time, no episode of emesis this shift, no family at bedside, patient remains alert and confused and withdrawn. VSS mood calm no acute distress noted.

## 2015-05-27 NOTE — Progress Notes (Signed)
Inpatient Diabetes Program Recommendations  AACE/ADA: New Consensus Statement on Inpatient Glycemic Control (2015)  Target Ranges:  Prepandial:   less than 140 mg/dL      Peak postprandial:   less than 180 mg/dL (1-2 hours)      Critically ill patients:  140 - 180 mg/dL   Review of Glycemic Control  Results for [name] [name], [name] [name] (MRN [id number]) as of 05/27/2015 13:05  Ref. Range 05/26/2015 11:34 05/26/2015 16:04 05/26/2015 20:55 05/27/2015 07:27 05/27/2015 11:19  Glucose-Capillary Latest Ref Range: 65-99 mg/dL 782302 (H) 956347 (H) 213270 (H) 276 (H) 259 (H)    Diabetes history: Type 2, A1C 7.5% on 05/24/15 Outpatient Diabetes medications: Lantus 14 units qhs, Humalog insulin 7 units with breakfast, 10 units with lunch and 10 units with supper.  Current orders for Inpatient glycemic control: Lantus 20 units qhs, Novolog correction insulin 0-9 units tid and 0-5 units qhs  Inpatient Diabetes Program Recommendations: post prandial blood sugars remain elevated despite current  Novolog correction insulin.  Consider adding Novolog insulin 6 units tid with meals (hold if she eats less than 50 %)-  Consider increasing Novolog correction insulin to 0-15 units tid.   Susette RacerJulie Aloura Matsuoka, RN, BA, MHA, CDE Diabetes Coordinator Inpatient Diabetes Program  [phone] (Team Pager) [phone] [facility] Office) 05/27/2015 1:08 PM

## 2015-05-27 NOTE — Clinical Social Work Note (Signed)
Patient's daughter called CSW and stated that she would first like the bed at [facility] if they can potentially keep her long term if she cannot return to [facility]. Her second choice would be [facility]. CSW has spoken to Sue Lushndrea at [facility] and they do have some long term beds available but reserve the right to approve or decline a patient after patient arrives. York SpanielMonica Bryse Blanchette MSW,LCSW [phone]

## 2015-05-27 NOTE — Consult Note (Signed)
CC: AMS  HPI: [name] [name] is an 74 y.o. female 74 year old female with history of dementia, diabetes, hypertension, history of CVA with minimal right-sided weakness presents from [facility] with altered mental status.  Past Medical History  Diagnosis Date  . Diabetes mellitus   . Hypertension   . CVA (cerebral infarction)   . GERD (gastroesophageal reflux disease)   . HLD (hyperlipidemia)   . Heart murmur   . Carotid artery occlusion 02/15/2009  . Stroke (HCC)   . Dementia     Past Surgical History  Procedure Laterality Date  . Appendectomy    . Tonsillectomy      Family History  Problem Relation Age of Onset  . Diabetes Mother   . Hypertension Mother   . Diabetes Father   . Hypertension Father   . Diabetes Sister   . Hypertension Sister     Social History:  reports that she quit smoking about 29 years ago. Her smoking use included Cigarettes. She has never used smokeless tobacco. She reports that she does not drink alcohol or use illicit drugs.  Allergies  Allergen Reactions  . Fexofenadine Hcl Rash    Medications: I have reviewed the patient's current medications.    Physical Examination: Blood pressure 156/65, pulse 70, temperature 97 F (36.1 C), temperature source Axillary, resp. rate 18, height  (1.448 m), weight 148 lb (67.132 kg), SpO2 96 %.  Able to tell me first name but not last Very slow to follow commands Generalized weakness b/l upper and lower extremities.  No focal deficits.    Laboratory Studies:   Basic Metabolic Panel:  Recent Labs Lab 05/24/15 0753 05/24/15 1232 05/25/15 0233 05/26/15 0237  NA 140  --  141 140  K 3.9  --  3.5 3.8  CL 104  --  109 102  CO2 29  --  28 25  GLUCOSE 151*  --  196* 333*  BUN 19  --  17 17  CREATININE 1.64* 1.61* 1.43* 1.21*  CALCIUM 8.7*  --  8.5* 9.5    Liver Function Tests:  Recent Labs Lab 05/24/15 0753  AST 21  ALT 11*  ALKPHOS 65  BILITOT 0.4   PROT 6.6  ALBUMIN 3.0*   No results for input(s): LIPASE, AMYLASE in the last 168 hours. No results for input(s): AMMONIA in the last 168 hours.  CBC:  Recent Labs Lab 05/24/15 0808 05/24/15 1232 05/25/15 0233 05/26/15 0237  WBC 8.7 8.6 12.5* 9.6  NEUTROABS 7.0*  --   --   --   HGB 11.7* 11.6* 11.2* 14.8  HCT 36.6 35.6 34.9* 46.1  MCV 88.5 87.9 87.9 88.1  PLT 192 194 166 195    Cardiac Enzymes:  Recent Labs Lab 05/24/15 0753 05/24/15 1232 05/24/15 2042 05/25/15 0233  TROPONINI 0.42* 0.43* 0.62* 0.41*    BNP: Invalid input(s): POCBNP  CBG:  Recent Labs Lab 05/26/15 1134 05/26/15 1604 05/26/15 2055 05/27/15 0727 05/27/15 1119  GLUCAP 302* 347* 270* 276* 259*    Microbiology: Results for orders placed or performed during the hospital encounter of 05/24/15  Urine culture     Status: None   Collection Time: 05/24/15  8:07 AM  Result Value Ref Range Status   Specimen Description URINE, CATHETERIZED  Final   Special Requests NONE  Final   Culture >=100,000 COLONIES/mL ESCHERICHIA COLI  Final   Report Status 05/26/2015 FINAL  Final   Organism ID, Bacteria ESCHERICHIA COLI  Final  Susceptibility   Escherichia coli - MIC*    AMPICILLIN >=32 RESISTANT Resistant     CEFTAZIDIME <=1 SENSITIVE Sensitive     CEFAZOLIN <=4 SENSITIVE Sensitive     CEFTRIAXONE <=1 SENSITIVE Sensitive     CIPROFLOXACIN <=0.25 SENSITIVE Sensitive     GENTAMICIN <=1 SENSITIVE Sensitive     IMIPENEM <=0.25 SENSITIVE Sensitive     TRIMETH/SULFA <=20 SENSITIVE Sensitive     NITROFURANTOIN Value in next row Sensitive      SENSITIVE<=16    PIP/TAZO Value in next row Sensitive      SENSITIVE<=4    LEVOFLOXACIN Value in next row Sensitive      SENSITIVE<=0.12    * >=100,000 COLONIES/mL ESCHERICHIA COLI  Culture, blood (routine x 2)     Status: None (Preliminary result)   Collection Time: 05/24/15  8:08 AM  Result Value Ref Range Status   Specimen Description BLOOD LEFT AC   Final   Special Requests BOTTLES DRAWN AEROBIC AND ANAEROBIC  2CC  Final   Culture  Setup Time   Final    GRAM NEGATIVE RODS ANAEROBIC BOTTLE ONLY CRITICAL RESULT CALLED TO, READ BACK BY AND VERIFIED WITH: TIFFANY MADDOX AT 2229 05/24/15 SDR CONFIRMED BY RW    Culture ESCHERICHIA COLI ANAEROBIC BOTTLE ONLY   Final   Report Status PENDING  Incomplete   Organism ID, Bacteria ESCHERICHIA COLI  Final      Susceptibility   Escherichia coli - MIC*    AMPICILLIN >=32      CEFTAZIDIME <=1 SENSITIVE Sensitive     CEFAZOLIN <=4 SENSITIVE Sensitive     CEFTRIAXONE <=1 SENSITIVE Sensitive     CIPROFLOXACIN <=0.25 SENSITIVE Sensitive     GENTAMICIN <=1 SENSITIVE Sensitive     IMIPENEM <=0.25 SENSITIVE Sensitive     TRIMETH/SULFA <=20 SENSITIVE Sensitive     PIP/TAZO Value in next row Sensitive      SENSITIVE<=4    ERTAPENEM Value in next row Sensitive      SENSITIVE<=0.5    LEVOFLOXACIN Value in next row Sensitive      SENSITIVE<=0.12    * ESCHERICHIA COLI  Culture, blood (routine x 2)     Status: None (Preliminary result)   Collection Time: 05/24/15  8:56 AM  Result Value Ref Range Status   Specimen Description BLOOD RIGHT AC  Final   Special Requests BOTTLES DRAWN AEROBIC AND ANAEROBIC  1CC  Final   Culture  Setup Time   Final    GRAM NEGATIVE RODS IN BOTH AEROBIC AND ANAEROBIC BOTTLES CRITICAL RESULT CALLED TO, READ BACK BY AND VERIFIED WITH: TIFFANY MADDOX AT 2229 05/24/15 SDR CONFIRMED BY RW    Culture   Final    ESCHERICHIA COLI IN BOTH AEROBIC AND ANAEROBIC BOTTLES    Report Status PENDING  Incomplete   Organism ID, Bacteria ESCHERICHIA COLI  Final      Susceptibility   Escherichia coli - MIC*    AMPICILLIN >=32 RESISTANT Resistant     CEFTAZIDIME <=1 SENSITIVE Sensitive     CEFAZOLIN <=4 SENSITIVE Sensitive     CEFTRIAXONE <=1 SENSITIVE Sensitive     CIPROFLOXACIN <=0.25 SENSITIVE Sensitive     GENTAMICIN <=1 SENSITIVE Sensitive     IMIPENEM <=0.25 SENSITIVE Sensitive      TRIMETH/SULFA <=20 SENSITIVE Sensitive     PIP/TAZO Value in next row Sensitive      SENSITIVE<=4    ERTAPENEM Value in next row Sensitive      SENSITIVE<=0.5  LEVOFLOXACIN Value in next row Sensitive      SENSITIVE<=0.12    * ESCHERICHIA COLI    Coagulation Studies: No results for input(s): LABPROT, INR in the last 72 hours.  Urinalysis:  Recent Labs Lab 05/24/15 0807  COLORURINE YELLOW*  LABSPEC 1.010  PHURINE 7.0  GLUCOSEU NEGATIVE  HGBUR 1+*  BILIRUBINUR NEGATIVE  KETONESUR TRACE*  PROTEINUR 100*  NITRITE NEGATIVE  LEUKOCYTESUR 3+*    Lipid Panel:     Component Value Date/Time   CHOL 124 05/19/2013 0429   TRIG 68 05/19/2013 0429   HDL 51 05/19/2013 0429   VLDL 14 05/19/2013 0429   LDLCALC 59 05/19/2013 0429    HgbA1C:  Lab Results  Component Value Date   HGBA1C 7.5* 05/24/2015    Urine Drug Screen:  No results found for: LABOPIA, COCAINSCRNUR, LABBENZ, AMPHETMU, THCU, LABBARB  Alcohol Level: No results for input(s): ETH in the last 168 hours.   Imaging: Dg Abd 1 View  05/26/2015  CLINICAL DATA:  Lethargy, confusion, vomiting. EXAM: ABDOMEN - 1 VIEW COMPARISON:  None. FINDINGS: Paucity of intestinal gas. No dilated bowel loops visualized. Mild dextroconvex lumbar scoliosis and degenerative changes. Atheromatous arterial calcifications. IMPRESSION: No acute abnormality. Electronically Signed   By: Beckie Salts M.D.   On: 05/26/2015 12:39     Assessment/Plan:  74 year old female with history of dementia, diabetes, hypertension, history of CVA with minimal right-sided weakness presents from [facility] with altered mental status.  Baseline dementia that has worsened in the setting of encephalopathy/infection  I suspect mental status will improve once infection clears  No need for any further imaging from neuro stand point.  Pauletta Browns  05/27/2015, 2:41 PM

## 2015-05-27 NOTE — Progress Notes (Signed)
[facility] [place] [facility]   PATIENT NAME: [name] RibasSophie [name]    MR#:  [id number]  DATE OF BIRTH:  [birth year]  SUBJECTIVE:  CHIEF COMPLAINT:   Chief Complaint  Patient presents with  . Altered Mental Status   - No further emesis, continues to be sleepy, arousable and answers some simple questions and follows simple commands - remains on liquid diet  REVIEW OF SYSTEMS:  Review of Systems  Unable to perform ROS: mental acuity  Neurological:       Right hand weakness which is chronic from previous stroke    DRUG ALLERGIES:   Allergies  Allergen Reactions  . Fexofenadine Hcl Rash    VITALS:  Blood pressure 156/65, pulse 70, temperature 97 F (36.1 C), temperature source Axillary, resp. rate 18, height 4\' 9"  (1.448 m), weight 67.132 kg (148 lb), SpO2 96 %.  PHYSICAL EXAMINATION:  Physical Exam  GENERAL:  74 y.o.-year-old patient lying in the bed with no acute distress. Sleepy, arousable. EYES: Ptosis of right eyelid noted. Pupils equal, round, reactive to light and accommodation. No scleral icterus. Extraocular muscles intact.  HEENT: Head atraumatic, normocephalic. Oropharynx and nasopharynx clear.  NECK:  Supple, no jugular venous distention. No thyroid enlargement, no tenderness.  LUNGS: Normal breath sounds bilaterally, no wheezing, rales,rhonchi or crepitation. No use of accessory muscles of respiration.  CARDIOVASCULAR: S1, S2 normal. No  rubs, or gallops. G/6 systolic murmur is present ABDOMEN: Soft, nontender, nondistended. Bowel sounds present. No organomegaly or mass.  EXTREMITIES: No pedal edema, cyanosis, or clubbing.  NEUROLOGIC: Cranial nerves II through XII are intact. Ptosis of right eyelid noted. Right hand placed like flexion deformity at the wrist. However was following commands she has good hand grip but the strength of right upper extremity is noted to be 4/5. Patient is left-handed according to her. Left upper extremity  strength is 5/ 5. Bilateral lower extremities are 3-4/5 in bed. Sensation intact. Gait not checked.  PSYCHIATRIC: The patient is sleepy, but arousable and following some simple commands, pleasantly confused.  SKIN: No obvious rash, lesion, or ulcer.    LABORATORY PANEL:   CBC  Recent Labs Lab 05/26/15 0237  WBC 9.6  HGB 14.8  HCT 46.1  PLT 195   ------------------------------------------------------------------------------------------------------------------  Chemistries   Recent Labs Lab 05/24/15 0753  05/26/15 0237  NA 140  < > 140  K 3.9  < > 3.8  CL 104  < > 102  CO2 29  < > 25  GLUCOSE 151*  < > 333*  BUN 19  < > 17  CREATININE 1.64*  < > 1.21*  CALCIUM 8.7*  < > 9.5  AST 21  --   --   ALT 11*  --   --   ALKPHOS 65  --   --   BILITOT 0.4  --   --   < > = values in this interval not displayed. ------------------------------------------------------------------------------------------------------------------  Cardiac Enzymes  Recent Labs Lab 05/25/15 0233  TROPONINI 0.41*   ------------------------------------------------------------------------------------------------------------------  RADIOLOGY:  Dg Abd 1 View  05/26/2015  CLINICAL DATA:  Lethargy, confusion, vomiting. EXAM: ABDOMEN - 1 VIEW COMPARISON:  None. FINDINGS: Paucity of intestinal gas. No dilated bowel loops visualized. Mild dextroconvex lumbar scoliosis and degenerative changes. Atheromatous arterial calcifications. IMPRESSION: No acute abnormality. Electronically Signed   By: Beckie SaltsSteven  Reid M.D.   On: 05/26/2015 12:39    EKG:   Orders placed or performed during the hospital encounter of 05/24/15  .  ED EKG  . ED EKG    ASSESSMENT AND PLAN:   74 year old female with history of dementia, diabetes, hypertension, history of CVA with minimal right-sided weakness presents from [facility] with altered mental status.  #1 Altered mental status- metabolic encephalopathy  from sepsis. -Patient has underlying dementia and also urinary tract infection. -cont antibiotics and continue to monitor. -CT of the head and MRI brain negative for any acute intracranial findings. - appreciate neuro consult, underlying dementia- worsening as well  #2 sepsis-secondary to urinary tract infection. -Blood and urine cultures growing E.coli, pansensitive -on rocephin- change to q daily levaquin  #3 malignant hypertension-. Still elevated blood pressure. Better than yesterday. 1 dose of clonidine patch, - also on metoprolol and Norvasc  #4 dementia with depression-mental status fluctuating -Continue her Seroquel. Also on Depakote. -Lethargy partly from her metabolic conditions. Not sure if dementia is worsening.  #5 diabetes mellitus-on Lantus and sliding scale insulin. Increase Lantus dose as sugars have been elevated, also SSI  #6 History of CVA-residual right-sided weakness. -MRI with old left coronary radiata infarcts. No acute infarct noted. -Continue to monitor at this time. Physical therapy consulted. Patient at baseline ambulates without any trouble.  #7 DVT prophylaxis - on lovenox  #8 elevated troponin-likely demand ischemia from her sepsis. No evidence of any acute MI Troponin level has stabilized ECHO with no wall motion abnormality  #9 Nausea/vomiting- none today, KUB with no obstruction - cont clear liquid diet for now  Physical therapy consult requested. Recommended SNF   All the records are reviewed and case discussed with Care Management/Social Workerr. Management plans discussed with the patient, family and they are in agreement.  CODE STATUS: Full code  TOTAL TIME TAKING CARE OF THIS PATIENT: 38 minutes.   POSSIBLE D/C IN 1-2 DAYS, DEPENDING ON CLINICAL CONDITION.   Enid Baas M.D on 05/27/2015 at 2:19 PM  Between 7am to 6pm - Pager - [phone]  After 6pm go to [email or web address] - password [email or web address] [facility]  [place]   Hospitalists  Office  [phone]  CC: Primary care physician; No PCP Per Patient

## 2015-05-27 NOTE — Care Management (Signed)
Patient admitted from [facility].  Open with [facility].  PT recommending SNF placement.  CSW following

## 2015-05-27 NOTE — Clinical Social Work Note (Signed)
Bed offers extended to patient's daughter this morning. She is going to think about the offers and then call CSW in a little bit with her decision. [place] SpanielMonica Rami Budhu MSW,LCSW [phone]

## 2015-05-28 LAB — CULTURE, BLOOD (ROUTINE X 2)

## 2015-05-28 LAB — GLUCOSE, CAPILLARY
GLUCOSE-CAPILLARY: 196 mg/dL — AB (ref 65–99)
GLUCOSE-CAPILLARY: 109 mg/dL — AB (ref 65–99)
Glucose-Capillary: 246 mg/dL — ABNORMAL HIGH (ref 65–99)
Glucose-Capillary: 130 mg/dL — ABNORMAL HIGH (ref 65–99)

## 2015-05-28 NOTE — Progress Notes (Addendum)
Inpatient Diabetes Program Recommendations  AACE/ADA: New Consensus Statement on Inpatient Glycemic Control (2015)  Target Ranges:  Prepandial:   less than 140 mg/dL      Peak postprandial:   less than 180 mg/dL (1-2 hours)      Critically ill patients:  140 - 180 mg/dL   Review of Glycemic Control  Results for [name] [name], Patrice A (MRN [id number]) as of 05/28/2015 12:51  Ref. Range 05/27/2015 11:19 05/27/2015 16:37 05/27/2015 20:30 05/28/2015 07:40 05/28/2015 12:01  Glucose-Capillary Latest Ref Range: 65-99 mg/dL 629259 (H) 528164 (H) 413212 (H) 196 (H) 246 (H)   Diabetes history: Type 2, A1C 7.5% on 05/24/15 Outpatient Diabetes medications: Lantus 14 units qhs, Humalog insulin 7 units with breakfast, 10 units with lunch and 10 units with supper.  Current orders for Inpatient glycemic control: Lantus 25 units qhs, Novolog correction insulin 0-15 units tid and 0-5 units qhs  Inpatient Diabetes Program Recommendations: post prandial blood sugars remain elevated despite current Novolog correction insulin. Consider adding Novolog insulin 6 units tid with meals (hold if she eats less than 50 %)- patient takes 7 units with breakfast, 10 units with lunch and 10 units with supper   Susette RacerJulie Kischa Altice, RN, [place], [place]HA, CDE Diabetes Coordinator Inpatient Diabetes Program  [phone] (Team Pager) [phone] [facility] Office) 05/28/2015 12:50 PM

## 2015-05-28 NOTE — Progress Notes (Signed)
[facility] [place] [facility]   PATIENT NAME: [name] RibasSophie [name]    MR#:  [id number]  DATE OF BIRTH:  Aug 10, 1940  SUBJECTIVE:  CHIEF COMPLAINT:   Chief Complaint  Patient presents with  . Altered Mental Status   - No further nausea or vomiting noted yesterday. Patient is more alert. Has  soft speech. Oriented to self at this time. -Following commands. Blood pressure is better controlled.   REVIEW OF SYSTEMS:  Review of Systems  Unable to perform ROS: mental acuity  Neurological:       Right hand weakness which is chronic from previous stroke    DRUG ALLERGIES:   Allergies  Allergen Reactions  . Fexofenadine Hcl Rash    VITALS:  Blood pressure 147/66, pulse 83, temperature 98.4 F (36.9 C), temperature source Oral, resp. rate 18, height 4\' 9"  (1.448 m), weight 67.132 kg (148 lb), SpO2 95 %.  PHYSICAL EXAMINATION:  Physical Exam  GENERAL:  74 y.o.-year-old patient lying in the bed with no acute distress. Sleepy, arousable. EYES: Ptosis of right eyelid noted. Pupils equal, round, reactive to light and accommodation. No scleral icterus. Extraocular muscles intact.  HEENT: Head atraumatic, normocephalic. Oropharynx and nasopharynx clear.  NECK:  Supple, no jugular venous distention. No thyroid enlargement, no tenderness.  LUNGS: Normal breath sounds bilaterally, no wheezing, rales,rhonchi or crepitation. No use of accessory muscles of respiration.  CARDIOVASCULAR: S1, S2 normal. No  rubs, or gallops. G/6 systolic murmur is present ABDOMEN: Soft, nontender, nondistended. Bowel sounds present. No organomegaly or mass.  EXTREMITIES: No pedal edema, cyanosis, or clubbing.  NEUROLOGIC: Cranial nerves II through XII are intact. Ptosis of right eyelid noted. Right hand placed like flexion deformity at the wrist. However was following commands she has good hand grip but the strength of right upper extremity is noted to be 4/5.  Left upper extremity strength  is 5/ 5. Left lower extremity is 4/5 and right lower extremity is 3/5 in bed. Sensation intact. Gait not checked.  PSYCHIATRIC: The patient is alert and following some simple commands, oriented to self, pleasantly confused.  SKIN: No obvious rash, lesion, or ulcer.    LABORATORY PANEL:   CBC  Recent Labs Lab 05/26/15 0237  WBC 9.6  HGB 14.8  HCT 46.1  PLT 195   ------------------------------------------------------------------------------------------------------------------  Chemistries   Recent Labs Lab 05/24/15 0753  05/26/15 0237  NA 140  < > 140  K 3.9  < > 3.8  CL 104  < > 102  CO2 29  < > 25  GLUCOSE 151*  < > 333*  BUN 19  < > 17  CREATININE 1.64*  < > 1.21*  CALCIUM 8.7*  < > 9.5  AST 21  --   --   ALT 11*  --   --   ALKPHOS 65  --   --   BILITOT 0.4  --   --   < > = values in this interval not displayed. ------------------------------------------------------------------------------------------------------------------  Cardiac Enzymes  Recent Labs Lab 05/25/15 0233  TROPONINI 0.41*   ------------------------------------------------------------------------------------------------------------------  RADIOLOGY:  Dg Abd 1 View  05/26/2015  CLINICAL DATA:  Lethargy, confusion, vomiting. EXAM: ABDOMEN - 1 VIEW COMPARISON:  None. FINDINGS: Paucity of intestinal gas. No dilated bowel loops visualized. Mild dextroconvex lumbar scoliosis and degenerative changes. Atheromatous arterial calcifications. IMPRESSION: No acute abnormality. Electronically Signed   By: Beckie SaltsSteven  Reid M.D.   On: 05/26/2015 12:39    EKG:   Orders placed or  performed during the hospital encounter of 05/24/15  . ED EKG  . ED EKG    ASSESSMENT AND PLAN:   74 year old female with history of dementia, diabetes, hypertension, history of CVA with minimal right-sided weakness presents from [facility] with altered mental status.  #1 Altered mental status- metabolic  encephalopathy from sepsis. -Patient has underlying dementia and also urinary tract infection. -cont antibiotics and continue to monitor. -CT of the head and MRI brain negative for any acute intracranial findings. - appreciate neuro consult, underlying dementia- worsening as well with the infection. -Continue to hold her Seroquel, tramadol and also gabapentin. Patient is slightly more alert today.  #2 sepsis-secondary to urinary tract infection. -Blood and urine cultures growing E.coli, pansensitive -Antibiotics changed to oral Levaquin-Will need 2 weeks of antibiotics  #3 malignant hypertension-. Improving -1 dose of clonidine patch, - also on metoprolol and Norvasc  #4 dementia with depression-mental status fluctuating -on Depakote.  #5 diabetes mellitus-on Lantus and sliding scale insulin. Increased Lantus dose as sugars have been elevated, also SSI  #6 History of CVA-residual right-sided weakness. -MRI with old left coronary radiata infarcts. No acute infarct noted. -Continue to monitor at this time. Physical therapy consulted. Patient at baseline ambulates without any trouble.  #7 DVT prophylaxis - on lovenox  #8 elevated troponin-likely demand ischemia from her sepsis. No evidence of any acute MI Troponin level has stabilized ECHO with no wall motion abnormality  #9 Nausea/vomiting- resolved now, KUB with no obstruction - Advance diet  Physical therapy consult requested. Recommended SNF Possible discharge tomorrow if mental status remains alert   All the records are reviewed and case discussed with Care Management/Social Workerr. Management plans discussed with the patient, family and they are in agreement.  CODE STATUS: Full code  TOTAL TIME TAKING CARE OF THIS PATIENT: 38 minutes.   POSSIBLE D/C IN 1-2 DAYS, DEPENDING ON CLINICAL CONDITION.   Enid Baas M.D on 05/28/2015 at 9:21 AM  Between 7am to 6pm - Pager - [phone]  After 6pm go to  [email or web address] - password [email or web address] [facility]  [facility]  Office  [phone]  CC: Primary care physician; No PCP Per Patient

## 2015-05-28 NOTE — Clinical Social Work Note (Signed)
CSW met with patient's daughter this morning and informed her about [facility] and she is very happy about their bed offer and is accepting it. Will facilitate discharge when time.  Shela Leff MSW,LCSW [phone]

## 2015-05-29 LAB — BASIC METABOLIC PANEL
Anion gap: 11 (ref 5–15)
BUN: 52 mg/dL — AB (ref 6–20)
CO2: 28 mmol/L (ref 22–32)
CREATININE: 2.07 mg/dL — AB (ref 0.44–1.00)
Calcium: 9.3 mg/dL (ref 8.9–10.3)
Chloride: 108 mmol/L (ref 101–111)
GFR calc Af Amer: 26 mL/min — ABNORMAL LOW (ref >60)
GFR, EST NON AFRICAN AMERICAN: 22 mL/min — AB (ref >60)
GLUCOSE: 202 mg/dL — AB (ref 65–99)
POTASSIUM: 3.3 mmol/L — AB (ref 3.5–5.1)
SODIUM: 147 mmol/L — AB (ref 135–145)

## 2015-05-29 LAB — GLUCOSE, CAPILLARY
GLUCOSE-CAPILLARY: 187 mg/dL — AB (ref 65–99)
GLUCOSE-CAPILLARY: 229 mg/dL — AB (ref 65–99)
Glucose-Capillary: 203 mg/dL — ABNORMAL HIGH (ref 65–99)
Glucose-Capillary: 99 mg/dL (ref 65–99)

## 2015-05-29 MED ORDER — METOPROLOL TARTRATE 25 MG PO TABS: 25.0000 mg | ORAL_TABLET | Freq: Two times a day (BID) | ORAL | Status: DC

## 2015-05-29 MED ORDER — INSULIN GLARGINE 100 UNIT/ML ~~LOC~~ SOLN: 25.0000 [IU] | Freq: Every day | SUBCUTANEOUS | Status: DC

## 2015-05-29 MED ORDER — FUROSEMIDE 40 MG PO TABS: 20.0000 mg | ORAL_TABLET | Freq: Every day | ORAL | Status: DC

## 2015-05-29 MED ORDER — ACETAMINOPHEN 325 MG PO TABS: 650.0000 mg | ORAL_TABLET | Freq: Four times a day (QID) | ORAL | Status: DC | PRN

## 2015-05-29 MED ORDER — ENOXAPARIN SODIUM 30 MG/0.3ML ~~LOC~~ SOLN
30.0000 mg | SUBCUTANEOUS | Status: DC
  Administered 2015-05-30: 30 mg via SUBCUTANEOUS
  Filled 2015-05-29: qty 0.3

## 2015-05-29 MED ORDER — LEVOFLOXACIN 250 MG PO TABS: 250.0000 mg | ORAL_TABLET | Freq: Every day | ORAL | Status: DC

## 2015-05-29 MED ORDER — CLONIDINE HCL 0.1 MG/24HR TD PTWK: 0.1000 mg | MEDICATED_PATCH | Freq: Once | TRANSDERMAL | Status: DC

## 2015-05-29 NOTE — Discharge Summary (Addendum)
[facility] [place] [facility]   PATIENT NAME: [name] RibasSophie [name]    MR#:  [id number]  DATE OF BIRTH:  [birth year]  DATE OF ADMISSION:  05/24/2015 ADMITTING PHYSICIAN: Gale Journeyatherine P Walsh, MD  DATE OF DISCHARGE: 05/31/2015 PRIMARY CARE PHYSICIAN: No PCP Per Patient    ADMISSION DIAGNOSIS:  CVA (cerebral infarction) [I63.9] Acute urinary tract infection [N39.0] Sepsis, due to unspecified organism (HCC) [A41.9]  DISCHARGE DIAGNOSIS:  Principal Problem:   Acute delirium Active Problems:   Altered mental status   NSTEMI (non-ST elevated myocardial infarction) (HCC)   Alzheimer's disease  acute cystitis  SECONDARY DIAGNOSIS:   Past Medical History  Diagnosis Date  . Diabetes mellitus   . Hypertension   . CVA (cerebral infarction)   . GERD (gastroesophageal reflux disease)   . HLD (hyperlipidemia)   . Heart murmur   . Carotid artery occlusion 02/15/2009  . Stroke (HCC)   . Dementia     HOSPITAL COURSE:   74 year old female with history of dementia, diabetes, hypertension, history of CVA with minimal right-sided weakness presents from [facility] with altered mental status.  # Altered mental status- metabolic encephalopathy from sepsis. Secondary to bacteremia with Escherichia coli and UTI with Escherichia coli -Significantly improved, patient is more alert and oriented during my examination -Patient has underlying dementia and also urinary tract infection. -cont antibiotics levofloxacin for 2 weeks total and continue to monitor. -CT of the head and MRI brain negative for any acute intracranial findings. - appreciate neuro consult, underlying dementia-  -Continue to hold her tramadol . we will consider resuming them at discharge. -Resumed Seroquel and Neurontin, her home medications  #Acute kidney injury-prerenal Provided IV fluids Creatinine trended down from 2.0-1.4 Patient is clinically improving Avoid  nephrotoxins   # sepsis-secondary to urinary tract infection and bacteremia. -Blood and urine cultures growing E.coli, pansensitive -Antibiotics changed to oral Levaquin-Will need 2 weeks of antibiotics  #3 malignant hypertension-. Improving -1 dose of clonidine patch, - also on metoprolol and Norvasc  #4 dementia with depression, with behavioral disturbances -Resumed her home medications Seroquel and Neurontin -Psych consult is placed-Dr. clapacs thinks her acute delirium is because of the acute cystitis and has no further recommendations -on Depakote.  #5 diabetes mellitus-on Lantus and sliding scale insulin. Increased Lantus dose to 27 units as sugars have been elevated, also SSI  #6 History of CVA-residual right-sided weakness. -MRI with old left coronary radiata infarcts. No acute infarct noted. -Continue to monitor at this time. Physical therapy consulted, recommending skilled nursing facility. As [facility] social worker [facility]   #7 DVT prophylaxis - on lovenox  #8 elevated troponin-likely demand ischemia from her sepsis. No evidence of any acute MI Troponin level has stabilized ECHO with no wall motion abnormality Outpatient follow-up with cardiology, Dr. Lady Garyfath is recommended  #9 Nausea/vomiting- resolved now, KUB with no obstruction - Advanced diet, patient tolerated well  Physical therapy consult requested. Recommended SNF Discharging patient to skilled nursing care. Daughter is agreeable    DISCHARGE CONDITIONS:   Fair  CONSULTS OBTAINED:  Treatment Team:  Dalia HeadingKenneth A Fath, MD Audery AmelJohn T Clapacs, MD Ramonita LabAruna Grayden Burley, MD   PROCEDURES none  DRUG ALLERGIES:   Allergies  Allergen Reactions  . Fexofenadine Hcl Rash    DISCHARGE MEDICATIONS:   Current Discharge Medication List    START taking these medications   Details  acetaminophen (TYLENOL) 325 MG tablet Take 2 tablets (650 mg total) by mouth  every 6 (six) hours as needed for mild pain  (or Fever >/= 101).    cloNIDine (CATAPRES - DOSED IN MG/24 HR) 0.1 mg/24hr patch Place 1 patch (0.1 mg total) onto the skin once. Qty: 4 patch, Refills: 12    levofloxacin (LEVAQUIN) 750 MG tablet Take 1 tablet (750 mg total) by mouth every other day. Qty: 7 tablet, Refills: 0      CONTINUE these medications which have CHANGED   Details  amLODipine (NORVASC) 10 MG tablet Take 0.5 tablets (5 mg total) by mouth daily. Qty: 15 tablet, Refills: 0    furosemide (LASIX) 40 MG tablet Take 0.5 tablets (20 mg total) by mouth daily. Qty: 30 tablet, Refills: 0    insulin aspart (NOVOLOG) 100 UNIT/ML injection Inject 3 Units into the skin 3 (three) times daily with meals. Qty: 1 vial    insulin glargine (LANTUS) 100 UNIT/ML injection Inject 0.27 mLs (27 Units total) into the skin at bedtime. Qty: 10 mL, Refills: 11    metoprolol tartrate (LOPRESSOR) 25 MG tablet Take 1 tablet (25 mg total) by mouth 2 (two) times daily. Qty: 60 tablet, Refills: 0    traMADol-acetaminophen (ULTRACET) 37.5-325 MG tablet Take 1 tablet by mouth every 8 (eight) hours as needed for moderate pain or severe pain. Qty: 30 tablet, Refills: 0      CONTINUE these medications which have NOT CHANGED   Details  allopurinol (ZYLOPRIM) 100 MG tablet Take 100 mg by mouth daily.    aspirin 325 MG tablet Take 325 mg by mouth every morning.     Calcium Carb-Cholecalciferol (CALCIUM 600+D3) 600-400 MG-UNIT TABS Take 1 tablet by mouth 2 (two) times daily with a meal.    divalproex (DEPAKOTE) 250 MG DR tablet Take 250 mg by mouth 2 (two) times daily.    gabapentin (NEURONTIN) 300 MG capsule Take 300 mg by mouth at bedtime.    levothyroxine (SYNTHROID, LEVOTHROID) 88 MCG tablet Take 88 mcg by mouth daily before breakfast.    lovastatin (MEVACOR) 20 MG tablet Take 20 mg by mouth every evening.     meclizine (ANTIVERT) 25 MG tablet Take 1 tablet (25 mg total) by mouth 3 (three) times daily as needed for dizziness or  nausea. Qty: 30 tablet, Refills: 1    metoCLOPramide (REGLAN) 5 MG tablet Take 5 mg by mouth 4 (four) times daily -  before meals and at bedtime.     omeprazole (PRILOSEC) 20 MG capsule Take 20 mg by mouth 2 (two) times daily.    potassium chloride (K-DUR) 10 MEQ tablet Take 10 mEq by mouth daily. With furosemide    QUEtiapine (SEROQUEL) 25 MG tablet Take 25 mg by mouth at bedtime.         DISCHARGE INSTRUCTIONS:   Activity as recommended by physical therapy Diet healthy heart, diabetic Follow-up with primary care physician at facility in 3-5 days Follow-up with primary cardiologist in 1 month or sooner if needed   DIET:  Cardiac diet, diabetic  DISCHARGE CONDITION:  Fair  ACTIVITY:  Activity as tolerated, as per PT recommendations  OXYGEN:  Home Oxygen: No.   Oxygen Delivery: room air  DISCHARGE LOCATION:  nursing home   If you experience worsening of your admission symptoms, develop shortness of breath, life threatening emergency, suicidal or homicidal thoughts you must seek medical attention immediately by calling 911 or calling your MD immediately  if symptoms less severe.  You Must read complete instructions/literature along with all the possible adverse reactions/side effects  for all the Medicines you take and that have been prescribed to you. Take any new Medicines after you have completely understood and accpet all the possible adverse reactions/side effects.   Please note  You were cared for by a hospitalist during your hospital stay. If you have any questions about your discharge medications or the care you received while you were in the hospital after you are discharged, you can call the unit and asked to speak with the hospitalist on call if the hospitalist that took care of you is not available. Once you are discharged, your primary care physician will handle any further medical issues. Please note that NO REFILLS for any discharge medications will be  authorized once you are discharged, as it is imperative that you return to your primary care physician (or establish a relationship with a primary care physician if you do not have one) for your aftercare needs so that they can reassess your need for medications and monitor your lab values.     Today  Chief Complaint  Patient presents with  . Altered Mental Status   Patient is resting comfortably. Whispering, reports she is feeling fine, no family members at bedside No overnight events.  ROS: Limited given the patient's history of dementia, reports she is doing all right CONSTITUTIONAL: Denies fevers, chills. Denies any weakness.  EYES: Denies blurry vision RESPIRATORY: Denies cough, wheeze, shortness of breath.  CARDIOVASCULAR: Denies chest pain, palpitations, edema.  GASTROINTESTINAL: Denies nausea, vomiting, diarrhea, abdominal pain. Denies bright red blood per rectum.   VITAL SIGNS:  Blood pressure 122/51, pulse 61, temperature 98.2 F (36.8 C), temperature source Oral, resp. rate 18, height  (1.448 m), weight 67.132 kg (148 lb), SpO2 100 %.  I/O:    Intake/Output Summary (Last 24 hours) at 05/31/15 1312 Last data filed at 05/31/15 1250  Gross per 24 hour  Intake 998.33 ml  Output      0 ml  Net 998.33 ml    PHYSICAL EXAMINATION:  GENERAL:  74 y.o.-year-old patient lying in the bed with no acute distress.  EYES: Pupils equal, round, reactive to light and accommodation. No scleral icterus. Extraocular muscles intact.  HEENT: Head atraumatic, normocephalic. Oropharynx and nasopharynx clear.  NECK:  Supple, no jugular venous distention. No thyroid enlargement, no tenderness.  LUNGS: Normal breath sounds bilaterally, no wheezing, rales,rhonchi or crepitation. No use of accessory muscles of respiration.  CARDIOVASCULAR: S1, S2 normal. No murmurs, rubs, or gallops.  ABDOMEN: Soft, non-tender, non-distended. Bowel sounds present. No organomegaly or mass.  EXTREMITIES: No  pedal edema, cyanosis, or clubbing.  NEUROLOGIC: Cranial nerves II through XII are grossly intact. Muscle strength is close to her baseline Sensation intact. Gait not checked.  PSYCHIATRIC: The patient is alert and oriented to person. Has chronic history of dementia.  SKIN: No obvious rash, lesion, or ulcer.   DATA REVIEW:   CBC  Recent Labs Lab 05/26/15 0237  WBC 9.6  HGB 14.8  HCT 46.1  PLT 195    Chemistries   Recent Labs Lab 05/31/15 0425  NA 141  K 3.5  CL 110  CO2 26  GLUCOSE 97  BUN 40*  CREATININE 1.44*  CALCIUM 7.8*    Cardiac Enzymes  Recent Labs Lab 05/25/15 0233  TROPONINI 0.41*    Microbiology Results  Results for orders placed or performed during the hospital encounter of 05/24/15  Urine culture     Status: None   Collection Time: 05/24/15  8:07 AM  Result  Value Ref Range Status   Specimen Description URINE, CATHETERIZED  Final   Special Requests NONE  Final   Culture >=100,000 COLONIES/mL ESCHERICHIA COLI  Final   Report Status 05/26/2015 FINAL  Final   Organism ID, Bacteria ESCHERICHIA COLI  Final      Susceptibility   Escherichia coli - MIC*    AMPICILLIN >=32 RESISTANT Resistant     CEFTAZIDIME <=1 SENSITIVE Sensitive     CEFAZOLIN <=4 SENSITIVE Sensitive     CEFTRIAXONE <=1 SENSITIVE Sensitive     CIPROFLOXACIN <=0.25 SENSITIVE Sensitive     GENTAMICIN <=1 SENSITIVE Sensitive     IMIPENEM <=0.25 SENSITIVE Sensitive     TRIMETH/SULFA <=20 SENSITIVE Sensitive     NITROFURANTOIN Value in next row Sensitive      SENSITIVE<=16    PIP/TAZO Value in next row Sensitive      SENSITIVE<=4    LEVOFLOXACIN Value in next row Sensitive      SENSITIVE<=0.12    * >=100,000 COLONIES/mL ESCHERICHIA COLI  Culture, blood (routine x 2)     Status: None   Collection Time: 05/24/15  8:08 AM  Result Value Ref Range Status   Specimen Description BLOOD LEFT AC  Final   Special Requests BOTTLES DRAWN AEROBIC AND ANAEROBIC  2CC  Final   Culture  Setup  Time   Final    GRAM NEGATIVE RODS ANAEROBIC BOTTLE ONLY CRITICAL RESULT CALLED TO, READ BACK BY AND VERIFIED WITH: TIFFANY MADDOX AT 2229 05/24/15 SDR CONFIRMED BY RW    Culture ESCHERICHIA COLI ANAEROBIC BOTTLE ONLY   Final   Report Status 05/28/2015 FINAL  Final   Organism ID, Bacteria ESCHERICHIA COLI  Final      Susceptibility   Escherichia coli - MIC*    AMPICILLIN >=32      CEFTAZIDIME <=1 SENSITIVE Sensitive     CEFAZOLIN <=4 SENSITIVE Sensitive     CEFTRIAXONE <=1 SENSITIVE Sensitive     CIPROFLOXACIN <=0.25 SENSITIVE Sensitive     GENTAMICIN <=1 SENSITIVE Sensitive     IMIPENEM <=0.25 SENSITIVE Sensitive     TRIMETH/SULFA <=20 SENSITIVE Sensitive     PIP/TAZO Value in next row Sensitive      SENSITIVE<=4    ERTAPENEM Value in next row Sensitive      SENSITIVE<=0.5    LEVOFLOXACIN Value in next row Sensitive      SENSITIVE<=0.12    * ESCHERICHIA COLI  Culture, blood (routine x 2)     Status: None   Collection Time: 05/24/15  8:56 AM  Result Value Ref Range Status   Specimen Description BLOOD RIGHT AC  Final   Special Requests BOTTLES DRAWN AEROBIC AND ANAEROBIC  1CC  Final   Culture  Setup Time   Final    GRAM NEGATIVE RODS IN BOTH AEROBIC AND ANAEROBIC BOTTLES CRITICAL RESULT CALLED TO, READ BACK BY AND VERIFIED WITH: TIFFANY MADDOX AT 2229 05/24/15 SDR CONFIRMED BY RW    Culture   Final    ESCHERICHIA COLI IN BOTH AEROBIC AND ANAEROBIC BOTTLES    Report Status 05/28/2015 FINAL  Final   Organism ID, Bacteria ESCHERICHIA COLI  Final      Susceptibility   Escherichia coli - MIC*    AMPICILLIN >=32 RESISTANT Resistant     CEFTAZIDIME <=1 SENSITIVE Sensitive     CEFAZOLIN <=4 SENSITIVE Sensitive     CEFTRIAXONE <=1 SENSITIVE Sensitive     CIPROFLOXACIN <=0.25 SENSITIVE Sensitive     GENTAMICIN <=1 SENSITIVE Sensitive  IMIPENEM <=0.25 SENSITIVE Sensitive     TRIMETH/SULFA <=20 SENSITIVE Sensitive     PIP/TAZO Value in next row Sensitive      SENSITIVE<=4     ERTAPENEM Value in next row Sensitive      SENSITIVE<=0.5    LEVOFLOXACIN Value in next row Sensitive      SENSITIVE<=0.12    * ESCHERICHIA COLI    RADIOLOGY:  No results found.  EKG:   Orders placed or performed during the hospital encounter of 05/24/15  . ED EKG  . ED EKG  . EKG 12-Lead  . EKG 12-Lead      Management plans discussed with the patient, family and they are in agreement.  CODE STATUS:     Code Status Orders        Start     Ordered   05/24/15 1110  Full code   Continuous     05/24/15 1109      TOTAL TIME TAKING CARE OF THIS PATIENT: 45 minutes.    @[facility] @  on 05/31/2015 at 1:12 PM  Between 7am to 6pm - Pager - [phone]  After 6pm go to [email or web address] - password [email or web address] [facility]  [facility]  Office  [phone]  CC: Primary care physician; No PCP Per Patient

## 2015-05-29 NOTE — Clinical Social Work Note (Signed)
CSW spoke with patient's daughter regarding potential discharge to [facility] and stated that the physician will round tomorrow and more than likely [facility] Patient's daughter verbalized agreement and understanding of discharge for tomorrow.  York SpanielMonica Glenys Snader MSW,LCSW [phone]

## 2015-05-29 NOTE — Progress Notes (Signed)
Pharmacy Note - Enoxaparin  Patient with orders for enoxaparin 40mg  SQ Q24H for VTE prophylaxis  Estimated Creatinine Clearance: 18.8 mL/min (by C-G formula based on Cr of 2.07).  Will adjust to enoxaparin 30mg  SQ Q24H for CrCl < 3930ml/min per policy  Garlon HatchetJody Alaynah Schutter, PharmD Clinical Pharmacist  05/29/2015 1:41 PM

## 2015-05-29 NOTE — Progress Notes (Signed)
Patient's daughter uncomfortable with discharge plan today. Discharge has been cancelled. Social work will prepare patient to go to [facility] tomorrow most likely.

## 2015-05-29 NOTE — Progress Notes (Signed)
Physical Therapy Treatment Patient Details Name: [name] [name] MRN: [id number] DOB: 27-Jun-1941 Today's Date: 05/29/2015    History of Present Illness Pt here with AMS, sepsis related to UTI    PT Comments    Patient with improved alertness this date; whispering 1-2 word responses to therapist at times.  Continues with very heavy posterior trunk lean with all sitting/standing activities (though slightly improved in sitting today).  Able to complete bed/chair with RW, max assist +2 for safety.   Decreased responsiveness noted with transfer to chair (improved with return to sitting), but vitals stable and WFL once secured in sitting.  May benefit from orthostatic assessment during next session.  RN informed/aware. Do recommend use of total lift for return to bed with nursing; sling placed in room.  RN/CNA informed/aware.   Follow Up Recommendations  SNF     Equipment Recommendations       Recommendations for Other Services       Precautions / Restrictions Precautions Precautions: Fall Restrictions Weight Bearing Restrictions: No    Mobility  Bed Mobility Overal bed mobility: Needs Assistance Bed Mobility: Supine to Sit     Supine to sit: Total assist     General bed mobility comments: patient able to self-assist with bilat UEs, but requires full support for truncal elevation  Transfers Overall transfer level: Needs assistance Equipment used: Rolling walker (2 wheeled) Transfers: Sit to/from [employer] Sit to Stand: Mod assist Stand pivot transfers: Max assist;+2 physical assistance       General transfer comment: unable to shift weight or advance LEs with transfer attempts; poor standing balance  Ambulation/Gait             General Gait Details: unable/unsafe   Stairs            Wheelchair Mobility    Modified Rankin (Stroke Patients Only)       Balance Overall balance assessment: Needs assistance Sitting-balance support:  No upper extremity supported;Feet supported Sitting balance-Leahy Scale: Fair Sitting balance - Comments: fluctuates from mod to min assist at times, worsens with fatigue     Standing balance-Leahy Scale: Poor Standing balance comment: very heavy posterior trunk lean/weight shift (though improved from previous sesssion)                    Cognition Arousal/Alertness: Lethargic Behavior During Therapy: Flat affect Overall Cognitive Status: Impaired/Different from baseline (improved alertness, intermittently whispering 1-2 word responses; remains delayed)                      Exercises Other Exercises Other Exercises: Unsupported sitting edge of bed, worked on dynamic L UE reaching to promote lumbar extension/ant pelvic tilt and overall forward weight shift.  Patient able to self-initiate with functional task, but unable to generalize into functional activity (i.e., sit/stand).  Performance fluctuates from min to mod/max assit Other Exercises: Sit/stand x3 with RW, mod asisst +1 for forward weight shift, LE foot placemetn, lift off and dynamic balance.  Fatigues quickly with all standing efforts. Other Exercises: SPT, bed/recliner with RW, max assist +2--patient with very poor standing balance; poor weight shift and ability to unweight/advance LEs once upright.  Decreased responsiveness noted with transfer to chair (improved with return to sitting), but vitals stable and WFL once secured in sitting.  May benefit from orthostatic assessment during next session.  RN informed/aware.    General Comments        Pertinent Vitals/Pain Pain Assessment: No/denies pain  Home Living                      Prior Function            PT Goals (current goals can now be found in the care plan section) Acute Rehab PT Goals Patient Stated Goal: unable to state PT Goal Formulation: Patient unable to participate in goal setting Time For Goal Achievement: 06/09/15 Potential to  Achieve Goals: Fair Progress towards PT goals: Progressing toward goals    Frequency  Min 2X/week    PT Plan Current plan remains appropriate    Co-evaluation             End of Session Equipment Utilized During Treatment: Gait belt Activity Tolerance: Patient limited by fatigue Patient left: in chair;with call bell/phone within reach;with chair alarm set     Time: [id number] PT Time Calculation (min) (ACUTE ONLY): 39 min  Charges:  $Therapeutic Activity: 23-37 mins $Neuromuscular Re-education: 8-22 mins                    G Codes:      Kaliann Coryell H. Manson PasseyBrown, PT, DPT, NCS 05/29/2015, 1:20 PM [phone]

## 2015-05-29 NOTE — Progress Notes (Signed)
[facility] [place] [facility]   PATIENT NAME: [name] RibasSophie [name]    MR#:  [id number]  DATE OF BIRTH:  Dec 11, 1940  SUBJECTIVE:  CHIEF COMPLAINT:   Chief Complaint  Patient presents with  . Altered Mental Status   patient is out of bed to chair. Reports feeling better. Tolerating food as reported by the nurse. No nausea or vomiting for the past 2 days. Patient is whispering and answering most of the questions appropriately during my examination. According to the nurse patient was talking fine this morning with normal voice, but she has noticed her whispering intermittently. As per my conversation with the daughter patient is not back to her baseline -  REVIEW OF SYSTEMS:  Review of Systems  Constitutional: Negative for fever and chills.  Respiratory: Negative for cough and shortness of breath.   Cardiovascular: Negative for chest pain.  Gastrointestinal: Negative for nausea, vomiting and abdominal pain.  Skin: Negative for itching.  Neurological: Negative for tremors.       Right hand weakness which is chronic from previous stroke  Psychiatric/Behavioral: The patient is not nervous/anxious.     DRUG ALLERGIES:   Allergies  Allergen Reactions  . Fexofenadine Hcl Rash    VITALS:  Blood pressure 121/60, pulse 72, temperature 98.6 F (37 C), temperature source Oral, resp. rate 19, height 4\' 9"  (1.448 m), weight 67.132 kg (148 lb), SpO2 99 %.  PHYSICAL EXAMINATION:  Physical Exam  GENERAL:  74 y.o.-year-old patient lying in the bed with no acute distress.  EYES: Ptosis of right eyelid noted. Pupils equal, round, reactive to light and accommodation. No scleral icterus. Extraocular muscles intact.  HEENT: Head atraumatic, normocephalic. Oropharynx and nasopharynx clear.  NECK:  Supple, no jugular venous distention. No thyroid enlargement, no tenderness.  LUNGS: Normal breath sounds bilaterally, no wheezing, rales,rhonchi or crepitation. No use of  accessory muscles of respiration.  CARDIOVASCULAR: S1, S2 normal. No  rubs, or gallops. G/6 systolic murmur is present ABDOMEN: Soft, nontender, nondistended. Bowel sounds present. No organomegaly or mass.  EXTREMITIES: No pedal edema, cyanosis, or clubbing.  NEUROLOGIC: Cranial nerves II through XII are intact. Ptosis of right eyelid noted. Right hand placed like flexion deformity at the wrist. However was following commands she has good hand grip but the strength of right upper extremity is noted to be 4/5.  Left upper extremity strength is 5/ 5. Left lower extremity is 4/5 and right lower extremity is 3/5 in bed. Sensation intact. Gait not checked.  PSYCHIATRIC: The patient is alert and following most of the commands, oriented 2-3 SKIN: No obvious rash, lesion, or ulcer.    LABORATORY PANEL:   CBC  Recent Labs Lab 05/26/15 0237  WBC 9.6  HGB 14.8  HCT 46.1  PLT 195   ------------------------------------------------------------------------------------------------------------------  Chemistries   Recent Labs Lab 05/24/15 0753  05/29/15 0429  NA 140  < > 147*  K 3.9  < > 3.3*  CL 104  < > 108  CO2 29  < > 28  GLUCOSE 151*  < > 202*  BUN 19  < > 52*  CREATININE 1.64*  < > 2.07*  CALCIUM 8.7*  < > 9.3  AST 21  --   --   ALT 11*  --   --   ALKPHOS 65  --   --   BILITOT 0.4  --   --   < > = values in this interval not displayed. ------------------------------------------------------------------------------------------------------------------  Cardiac Enzymes  Recent  Labs Lab 05/25/15 0233  TROPONINI 0.41*   ------------------------------------------------------------------------------------------------------------------  RADIOLOGY:  No results found.  EKG:   Orders placed or performed during the hospital encounter of 05/24/15  . ED EKG  . ED EKG    ASSESSMENT AND PLAN:   74 year old female with history of dementia, diabetes, hypertension, history of CVA  with minimal right-sided weakness presents from [facility] with altered mental status.  #1 Altered mental status- metabolic encephalopathy from sepsis. Secondary to bacteremia with Escherichia coli and UTI with Escherichia coli -Significantly improved, I'll attend oriented during my examination -Patient has underlying dementia and also urinary tract infection. -cont antibiotics levofloxacin for 2 weeks total and continue to monitor. -CT of the head and MRI brain negative for any acute intracranial findings. - appreciate neuro consult, underlying dementia-  -Continue to hold her Seroquel, tramadol and also gabapentin,  we will consider resuming them at discharge. - Patient is  more alert today.  #2 sepsis-secondary to urinary tract infection and bacteremia. -Blood and urine cultures growing E.coli, pansensitive -Antibiotics changed to oral Levaquin-Will need 2 weeks of antibiotics  #3 malignant hypertension-. Improving -1 dose of clonidine patch, - also on metoprolol and Norvasc  #4 dementia with depression-mental status fluctuating -on Depakote.  #5 diabetes mellitus-on Lantus and sliding scale insulin. Increased Lantus dose as sugars have been elevated, also SSI  #6 History of CVA-residual right-sided weakness. -MRI with old left coronary radiata infarcts. No acute infarct noted. -Continue to monitor at this time. Physical therapy consulted, recommending skilled nursing facility. As per social worker [facility]   #7 DVT prophylaxis - on lovenox  #8 elevated troponin-likely demand ischemia from her sepsis. No evidence of any acute MI Troponin level has stabilized ECHO with no wall motion abnormality  #9 Nausea/vomiting- resolved now, KUB with no obstruction - Advance diet  Physical therapy consult requested. Recommended SNF Possible discharge tomorrow if mental status remains improving   All the records are reviewed and case  discussed with Care Management/Social Workerr [facility] Management plans discussed with the patient, and daughter Ms. Okey Regal [phone] via phone. Daughter has several questions, all of them were answered to her satisfaction   CODE STATUS: Full code  TOTAL TIME TAKING CARE OF THIS PATIENT: 45 minutes.  Greater than 50% time was spent on coordination of care and counseling  POSSIBLE D/C IN am DAYS, DEPENDING ON CLINICAL CONDITION.   Ramonita Lab M.D on 05/29/2015 at 2:37 PM  Between 7am to 6pm - Pager - [phone]  After 6pm go to [email or web address] - password [email or web address] [facility]  [place] [facility]  Office  [phone]  CC: Primary care physician; No PCP Per Patient

## 2015-05-30 DIAGNOSIS — R410 Disorientation, unspecified: Secondary | ICD-10-CM

## 2015-05-30 DIAGNOSIS — F0280 Dementia in other diseases classified elsewhere without behavioral disturbance: Secondary | ICD-10-CM

## 2015-05-30 DIAGNOSIS — G309 Alzheimer's disease, unspecified: Secondary | ICD-10-CM

## 2015-05-30 LAB — BASIC METABOLIC PANEL
Anion gap: 6 (ref 5–15)
BUN: 50 mg/dL — AB (ref 6–20)
CHLORIDE: 109 mmol/L (ref 101–111)
CO2: 26 mmol/L (ref 22–32)
Calcium: 8.6 mg/dL — ABNORMAL LOW (ref 8.9–10.3)
Creatinine, Ser: 1.8 mg/dL — ABNORMAL HIGH (ref 0.44–1.00)
GFR calc Af Amer: 31 mL/min — ABNORMAL LOW (ref >60)
GFR calc non Af Amer: 27 mL/min — ABNORMAL LOW (ref >60)
GLUCOSE: 281 mg/dL — AB (ref 65–99)
POTASSIUM: 3.6 mmol/L (ref 3.5–5.1)
Sodium: 141 mmol/L (ref 135–145)

## 2015-05-30 LAB — GLUCOSE, CAPILLARY
GLUCOSE-CAPILLARY: 149 mg/dL — AB (ref 65–99)
Glucose-Capillary: 97 mg/dL (ref 65–99)

## 2015-05-30 MED ORDER — INSULIN GLARGINE 100 UNIT/ML ~~LOC~~ SOLN
30.0000 [IU] | Freq: Every day | SUBCUTANEOUS | Status: DC
  Filled 2015-05-30 (×2): qty 0.3

## 2015-05-30 MED ORDER — SODIUM CHLORIDE 0.9 % IV SOLN
INTRAVENOUS | Status: DC
  Administered 2015-05-30 (×2): via INTRAVENOUS

## 2015-05-30 MED ORDER — QUETIAPINE FUMARATE 25 MG PO TABS: 25.0000 mg | ORAL_TABLET | Freq: Every day | ORAL | Status: DC

## 2015-05-30 MED ORDER — SODIUM CHLORIDE 0.9 % IJ SOLN: 3.0000 mL | INTRAMUSCULAR | Status: DC | PRN

## 2015-05-30 MED ORDER — LEVOFLOXACIN 750 MG PO TABS
750.0000 mg | ORAL_TABLET | ORAL | Status: DC
  Administered 2015-05-31: 750 mg via ORAL
  Filled 2015-05-30: qty 1

## 2015-05-30 MED ORDER — GABAPENTIN 600 MG PO TABS
300.0000 mg | ORAL_TABLET | Freq: Every day | ORAL | Status: DC
  Administered 2015-05-30: 300 mg via ORAL
  Filled 2015-05-30: qty 2

## 2015-05-30 MED ORDER — POTASSIUM CHLORIDE 20 MEQ PO PACK
40.0000 meq | PACK | Freq: Once | ORAL | Status: AC
  Administered 2015-05-30: 40 meq via ORAL
  Filled 2015-05-30: qty 2

## 2015-05-30 MED ORDER — INSULIN ASPART 100 UNIT/ML ~~LOC~~ SOLN
6.0000 [IU] | Freq: Three times a day (TID) | SUBCUTANEOUS | Status: DC
  Administered 2015-05-30 – 2015-05-31 (×2): 6 [IU] via SUBCUTANEOUS
  Filled 2015-05-30 (×2): qty 6

## 2015-05-30 NOTE — Progress Notes (Signed)
Patient has been alert but more sleepy today. Not speaking much other than a few whispers. Not eating solids well, but will eat ice cream and drink liquids. IV fluids running to improve kidney function. Daughter, Okey RegalCarol, has been updated about plan of care. Will continue to monitor.

## 2015-05-30 NOTE — Progress Notes (Signed)
[facility] [place] [facility]   PATIENT NAME: [name] RibasSophie [name]    MR#:  [id number]  DATE OF BIRTH:  1941-03-18  SUBJECTIVE:  CHIEF COMPLAINT:   Chief Complaint  Patient presents with  . Altered Mental Status   patient is out of bed to chair. Reports feeling better. Patient is having bizarre behavior as reported by the RN.  Tolerating food as reported by the nurse. -  REVIEW OF SYSTEMS:  Review of Systems  Constitutional: Negative for fever and chills.  Respiratory: Negative for cough and shortness of breath.   Cardiovascular: Negative for chest pain.  Gastrointestinal: Negative for nausea, vomiting and abdominal pain.  Skin: Negative for itching.  Neurological: Negative for tremors.       Right hand weakness which is chronic from previous stroke  Psychiatric/Behavioral: The patient is not nervous/anxious.     DRUG ALLERGIES:   Allergies  Allergen Reactions  . Fexofenadine Hcl Rash    VITALS:  Blood pressure 121/61, pulse 66, temperature 98 F (36.7 C), temperature source Oral, resp. rate 16, height 4\' 9"  (1.448 m), weight 67.132 kg (148 lb), SpO2 100 %.  PHYSICAL EXAMINATION:  Physical Exam  GENERAL:  74 y.o.-year-old patient lying in the bed with no acute distress.  EYES: Ptosis of right eyelid noted. Pupils equal, round, reactive to light and accommodation. No scleral icterus. Extraocular muscles intact.  HEENT: Head atraumatic, normocephalic. Oropharynx and nasopharynx clear.  NECK:  Supple, no jugular venous distention. No thyroid enlargement, no tenderness.  LUNGS: Normal breath sounds bilaterally, no wheezing, rales,rhonchi or crepitation. No use of accessory muscles of respiration.  CARDIOVASCULAR: S1, S2 normal. No  rubs, or gallops. G/6 systolic murmur is present ABDOMEN: Soft, nontender, nondistended. Bowel sounds present. No organomegaly or mass.  EXTREMITIES: No pedal edema, cyanosis, or clubbing.  NEUROLOGIC: Cranial nerves II  through XII are intact. Ptosis of right eyelid noted. Right hand placed like flexion deformity at the wrist. However was following commands she has good hand grip but the strength of right upper extremity is noted to be 4/5.  Left upper extremity strength is 5/ 5. Left lower extremity is 4/5 and right lower extremity is 3/5 in bed. Sensation intact. Gait not checked.  PSYCHIATRIC: The patient is alert and following most of the commands, oriented 2-3 SKIN: No obvious rash, lesion, or ulcer.    LABORATORY PANEL:   CBC  Recent Labs Lab 05/26/15 0237  WBC 9.6  HGB 14.8  HCT 46.1  PLT 195   ------------------------------------------------------------------------------------------------------------------  Chemistries   Recent Labs Lab 05/24/15 0753  05/30/15 1434  NA 140  < > 141  K 3.9  < > 3.6  CL 104  < > 109  CO2 29  < > 26  GLUCOSE 151*  < > 281*  BUN 19  < > 50*  CREATININE 1.64*  < > 1.80*  CALCIUM 8.7*  < > 8.6*  AST 21  --   --   ALT 11*  --   --   ALKPHOS 65  --   --   BILITOT 0.4  --   --   < > = values in this interval not displayed. ------------------------------------------------------------------------------------------------------------------  Cardiac Enzymes  Recent Labs Lab 05/25/15 0233  TROPONINI 0.41*   ------------------------------------------------------------------------------------------------------------------  RADIOLOGY:  No results found.  EKG:   Orders placed or performed during the hospital encounter of 05/24/15  . ED EKG  . ED EKG    ASSESSMENT AND PLAN:  74 year old female with history of dementia, diabetes, hypertension, history of CVA with minimal right-sided weakness presents from [facility] with altered mental status.  # Altered mental status- metabolic encephalopathy from sepsis. Secondary to bacteremia with Escherichia coli and UTI with Escherichia coli -Significantly improved, patient is more  alert and oriented during my examination -Patient has underlying dementia and also urinary tract infection. -cont antibiotics levofloxacin for 2 weeks total and continue to monitor. -CT of the head and MRI brain negative for any acute intracranial findings. - appreciate neuro consult, underlying dementia-  -Continue to hold her  tramadol .  we will consider resuming them at discharge. -Resume Seroquel and Neurontin, her home medications  #Acute kidney injury-prerenal Provide IV fluids Check BMP in a.m.   # sepsis-secondary to urinary tract infection and bacteremia. -Blood and urine cultures growing E.coli, pansensitive -Antibiotics changed to oral Levaquin-Will need 2 weeks of antibiotics  #3 malignant hypertension-. Improving -1 dose of clonidine patch, - also on metoprolol and Norvasc  #4 dementia with depression, with behavioral disturbances -Resume her home medications Seroquel and Neurontin -Psych consult is placed -on Depakote.  #5 diabetes mellitus-on Lantus and sliding scale insulin. Increased Lantus dose as sugars have been elevated, also SSI  #6 History of CVA-residual right-sided weakness. -MRI with old left coronary radiata infarcts. No acute infarct noted. -Continue to monitor at this time. Physical therapy consulted, recommending skilled nursing facility. As per social worker [facility]   #7 DVT prophylaxis - on lovenox  #8 elevated troponin-likely demand ischemia from her sepsis. No evidence of any acute MI Troponin level has stabilized ECHO with no wall motion abnormality  #9 Nausea/vomiting- resolved now, KUB with no obstruction - Advance diet  Physical therapy consult requested. Recommended SNF Possible discharge tomorrow if mental status remains improving   All the records are reviewed and case discussed with Care Management/Social Workerr [facility] Management plans discussed with the patient, and daughter Ms. Okey Regal  [phone] via phone. Daughter has several questions, all of them were answered to her satisfaction   CODE STATUS: Full code  TOTAL TIME TAKING CARE OF THIS PATIENT: 45 minutes.  Greater than 50% time was spent on coordination of care and counseling  POSSIBLE D/C IN am DAYS, DEPENDING ON CLINICAL CONDITION.   Ramonita Lab M.D on 05/30/2015 at 4:29 PM  Between 7am to 6pm - Pager - [phone]  After 6pm go to [email or web address] - password [email or web address] [facility]  [place] [facility]  Office  [phone]  CC: Primary care physician; No PCP Per Patient

## 2015-05-30 NOTE — Progress Notes (Signed)
Initial Nutrition Assessment  INTERVENTION:   Meals and Snacks: Cater to patient preferences; recommend downgrading diet to Dysphagia III (mecanical soft, easy to chew) Medical Food Supplement Therapy: pt drinking fluids well, recommend addition of Mighty Shakes TID with meals; pt also likes ice cream, will try Magic Cup at [employer] and [employer] Feeding Assistance: continue feeding assistance at meal times, encourage po intake, fluid intake  NUTRITION DIAGNOSIS:   Inadequate oral intake related to poor appetite, acute illness as evidenced by meal completion < 25%.  GOAL:   Patient will meet greater than or equal to 90% of their needs  MONITOR:    (Energy Intake, Anthropometrics, Electrolyte/Renal Profile, Digestive System)  REASON FOR ASSESSMENT:   LOS    ASSESSMENT:    Pt admitted with AMS due to metabolic encephalopathy due to sepsis, UTI; pt pointed to water cup at bedside and drank most of it but did not verbalize anything on visit today. Asked pt to open her mouth and pt did not do this  Past Medical History  Diagnosis Date  . Diabetes mellitus   . Hypertension   . CVA (cerebral infarction)   . GERD (gastroesophageal reflux disease)   . HLD (hyperlipidemia)   . Heart murmur   . Carotid artery occlusion 02/15/2009  . Stroke (HCC)   . Dementia      Diet Order:  DIET DYS 3 Room service appropriate?: Yes; Fluid consistency:: Thin  Energy Intake: recorded po intake s0% to sips/bites at meal times; spoke with Tacey RuizLeah RN who reports pt tends to eat softer foods like ice cream, apple sauce, pudding and drinks liquids really well but po intake has been poor  Food and Nutrition Related History: unable to assess as pt did not speak on visit today  Digestive System: unsure if pt is edentulous or has poor dentition as pt would not open mouth for writer but may benefit from easy to chew diet  Electrolyte and Renal Profile:  Recent Labs Lab 05/26/15 0237 05/29/15 0429  05/30/15 1434  BUN 17 52* 50*  CREATININE 1.21* 2.07* 1.80*  NA 140 147* 141  K 3.8 3.3* 3.6   Glucose Profile:   Recent Labs  05/29/15 1055 05/29/15 1707 05/29/15 2128  GLUCAP 203* 99 229*   Meds: NS at 100 ml/hr, ss novolog, lantus  Nutrition Focused Physical Exam:  Unable to complete Nutrition-Focused physical exam at this time.    Height:   Ht Readings from Last 1 Encounters:  05/24/15 4\' 9"  (1.448 m)    Weight: noted wt loss per weight encounters since May but weighed less in March that she weighs now  [employer]t Readings from Last 1 Encounters:  05/24/15 148 lb (67.132 kg)    Wt Readings from Last 10 Encounters:  05/24/15 148 lb (67.132 kg)  12/10/14 157 lb 10.1 oz (71.501 kg)  10/08/11 134 lb (60.782 kg)  10/01/11 134 lb (60.782 kg)  09/28/11 126 lb 1.6 oz (57.199 kg)   BMI:  Body mass index is 32.02 kg/(m^2).  Estimated Nutritional Needs:   Kcal:  1150-1380 kcals using IBW 46 kg  Protein:  46-55 g (1.0-1.2 g/kg)   Fluid:  1150-1380 mL   HIGH Care Level  Romelle Starcherate Leslyn Monda MS, RD, LDN [phone] Pager

## 2015-05-30 NOTE — Consult Note (Signed)
[facility] Face-to-Face Psychiatry Consult   Reason for Consult:  Consult for this 74 year old woman with a history of dementia currently in the hospital with urinary tract infection. Consult for dementia and "bizarre behavior" Referring Physician:  Gouru Patient Identification: [name] [name] MRN:  [id number] Principal Diagnosis: Acute delirium Diagnosis:   Patient Active Problem List   Diagnosis Date Noted  . Acute delirium [R41.0] 05/30/2015  . Alzheimer's disease [G30.9] 05/30/2015  . Altered mental status [R41.82] 05/24/2015  . NSTEMI (non-ST elevated myocardial infarction) ([place]) [I21.4] 05/24/2015  . Occlusion and stenosis of carotid artery without mention of cerebral infarction [I65.29] 10/08/2011  . CKD (chronic kidney disease) [N18.9] 09/25/2011  . Nausea and vomiting [787.0] 09/22/2011  . HTN (hypertension) [I10] 09/22/2011  . H/O: CVA (cardiovascular accident) [Z86.73] 09/22/2011  . Diabetes mellitus, type 2 ([place]) [E11.9] 09/22/2011    Total Time spent with patient: 45 minutes  Subjective:   [name] [name] is a 74 y.o. female patient admitted with patient is not able to give any history. History is all from her daughter who reports the patient was described as nonresponsive.Marland Kitchen  HPI:  74 year old woman. History from interview with the patient and her daughter. Review of the chart. Review of labs. Patient was found at her assisted living facility consciousness but not responding verbally. She was brought to the hospital. Daughter reports that since being here at the hospital the patient's mental state is not normal. She normally is able to have a conversation. Her short-term memory is poor but her longer term memory is intact. Since being at the hospital the patient is speaking in whispers and seems frequently confused. Workup has been done with MRI scan and there is no sign of a stroke. Patient does have a urinary tract infection. No other new stress. No recent symptoms to suggest  depression.  Social history: Patient resides at a residential assisted living facility. Family are involved actively with her.  Medical history: History of myocardial infarction in the past history of possible occlusion of a carotid artery. Chronic kidney disease hypertension diabetes  Substance abuse history: No history of substance abuse problems  Past Psychiatric History: No past psychiatric history identified no history of depression no psychiatric hospitalization no suicide attempts.  Risk to Self: Is patient at risk for suicide?: No Risk to Others:   Prior Inpatient Therapy:   Prior Outpatient Therapy:    Past Medical History:  Past Medical History  Diagnosis Date  . Diabetes mellitus   . Hypertension   . CVA (cerebral infarction)   . GERD (gastroesophageal reflux disease)   . HLD (hyperlipidemia)   . Heart murmur   . Carotid artery occlusion 02/15/2009  . Stroke ([place])   . Dementia     Past Surgical History  Procedure Laterality Date  . Appendectomy    . Tonsillectomy     Family History:  Family History  Problem Relation Age of Onset  . Diabetes Mother   . Hypertension Mother   . Diabetes Father   . Hypertension Father   . Diabetes Sister   . Hypertension Sister    Family Psychiatric  History: No known family history of any mental illness at all. Social History:  History  Alcohol Use No     History  Drug Use No    Social History   Social History  . Marital Status: Single    Spouse Name: N/A  . Number of Children: N/A  . Years of Education: N/A   Social History  Main Topics  . Smoking status: Former Smoker    Types: Cigarettes    Quit date: 07/20/1985  . Smokeless tobacco: Never Used  . Alcohol Use: No  . Drug Use: No  . Sexual Activity: No   Other Topics Concern  . None   Social History Narrative   Additional Social History:                          Allergies:   Allergies  Allergen Reactions  . Fexofenadine Hcl Rash     Labs:  Results for orders placed or performed during the hospital encounter of 05/24/15 (from the past 48 hour(s))  Glucose, capillary     Status: Abnormal   Collection Time: 05/28/15  9:11 PM  Result Value Ref Range   Glucose-Capillary 109 (H) 65 - 99 mg/dL  Basic metabolic panel     Status: Abnormal   Collection Time: 05/29/15  4:29 AM  Result Value Ref Range   Sodium 147 (H) 135 - 145 mmol/L   Potassium 3.3 (L) 3.5 - 5.1 mmol/L   Chloride 108 101 - 111 mmol/L   CO2 28 22 - 32 mmol/L   Glucose, Bld 202 (H) 65 - 99 mg/dL   BUN 52 (H) 6 - 20 mg/dL   Creatinine, Ser 2.07 (H) 0.44 - 1.00 mg/dL   Calcium 9.3 8.9 - 10.3 mg/dL   GFR calc non Af Amer 22 (L) >60 mL/min   GFR calc Af Amer 26 (L) >60 mL/min    Comment: (NOTE) The eGFR has been calculated using the CKD EPI equation. This calculation has not been validated in all clinical situations. eGFR's persistently <60 mL/min signify possible Chronic Kidney Disease.    Anion gap 11 5 - 15  Glucose, capillary     Status: Abnormal   Collection Time: 05/29/15  7:23 AM  Result Value Ref Range   Glucose-Capillary 187 (H) 65 - 99 mg/dL  Glucose, capillary     Status: Abnormal   Collection Time: 05/29/15 10:55 AM  Result Value Ref Range   Glucose-Capillary 203 (H) 65 - 99 mg/dL  Glucose, capillary     Status: None   Collection Time: 05/29/15  5:07 PM  Result Value Ref Range   Glucose-Capillary 99 65 - 99 mg/dL  Glucose, capillary     Status: Abnormal   Collection Time: 05/29/15  9:28 PM  Result Value Ref Range   Glucose-Capillary 229 (H) 65 - 99 mg/dL  Basic metabolic panel     Status: Abnormal   Collection Time: 05/30/15  2:34 PM  Result Value Ref Range   Sodium 141 135 - 145 mmol/L   Potassium 3.6 3.5 - 5.1 mmol/L   Chloride 109 101 - 111 mmol/L   CO2 26 22 - 32 mmol/L   Glucose, Bld 281 (H) 65 - 99 mg/dL   BUN 50 (H) 6 - 20 mg/dL   Creatinine, Ser 1.80 (H) 0.44 - 1.00 mg/dL   Calcium 8.6 (L) 8.9 - 10.3 mg/dL   GFR  calc non Af Amer 27 (L) >60 mL/min   GFR calc Af Amer 31 (L) >60 mL/min    Comment: (NOTE) The eGFR has been calculated using the CKD EPI equation. This calculation has not been validated in all clinical situations. eGFR's persistently <60 mL/min signify possible Chronic Kidney Disease.    Anion gap 6 5 - 15  Glucose, capillary     Status: Abnormal   Collection Time:  05/30/15  4:17 PM  Result Value Ref Range   Glucose-Capillary 149 (H) 65 - 99 mg/dL   Comment 1 Notify RN     Current Facility-Administered Medications  Medication Dose Route Frequency Provider Last Rate Last Dose  . 0.9 %  sodium chloride infusion   Intravenous Continuous Nicholes Mango, MD 100 mL/hr at 05/30/15 1115    . acetaminophen (TYLENOL) tablet 650 mg  650 mg Oral Q6H PRN Aldean Jewett, MD       Or  . acetaminophen (TYLENOL) suppository 650 mg  650 mg Rectal Q6H PRN Aldean Jewett, MD      . allopurinol (ZYLOPRIM) tablet 100 mg  100 mg Oral Daily Aldean Jewett, MD   100 mg at 05/30/15 0948  . amLODipine (NORVASC) tablet 10 mg  10 mg Oral Daily Gladstone Lighter, MD   10 mg at 05/30/15 0948  . aspirin tablet 325 mg  325 mg Oral BH-q7a Aldean Jewett, MD   325 mg at 05/30/15 0948  . cloNIDine (CATAPRES - Dosed in mg/24 hr) patch 0.1 mg  0.1 mg Transdermal Once Gladstone Lighter, MD   0.1 mg at 05/26/15 1220  . divalproex (DEPAKOTE) DR tablet 250 mg  250 mg Oral Q12H Gladstone Lighter, MD   250 mg at 05/30/15 0948  . enoxaparin (LOVENOX) injection 30 mg  30 mg Subcutaneous Q24H Nicholes Mango, MD   30 mg at 05/30/15 1620  . gabapentin (NEURONTIN) tablet 300 mg  300 mg Oral QHS Aruna Gouru, MD      . insulin aspart (novoLOG) injection 0-15 Units  0-15 Units Subcutaneous TID WC Gladstone Lighter, MD   2 Units at 05/30/15 1735  . insulin aspart (novoLOG) injection 0-5 Units  0-5 Units Subcutaneous QHS Aldean Jewett, MD   2 Units at 05/29/15 2212  . insulin aspart (novoLOG) injection 6 Units  6 Units  Subcutaneous TID WC Nicholes Mango, MD   6 Units at 05/30/15 1735  . insulin glargine (LANTUS) injection 30 Units  30 Units Subcutaneous QHS Nicholes Mango, MD      . Derrill Memo ON 05/31/2015] levofloxacin (LEVAQUIN) tablet 750 mg  750 mg Oral Q48H Vena Rua, RPH      . levothyroxine (SYNTHROID, LEVOTHROID) tablet 88 mcg  88 mcg Oral QAC breakfast Aldean Jewett, MD   88 mcg at 05/30/15 0948  . meclizine (ANTIVERT) tablet 25 mg  25 mg Oral TID PRN Aldean Jewett, MD      . metoprolol tartrate (LOPRESSOR) tablet 25 mg  25 mg Oral BID Aldean Jewett, MD   25 mg at 05/30/15 0948  . ondansetron (ZOFRAN) tablet 4 mg  4 mg Oral Q6H PRN Aldean Jewett, MD       Or  . ondansetron [facility]) injection 4 mg  4 mg Intravenous Q6H PRN Aldean Jewett, MD   4 mg at 05/26/15 2128  . pantoprazole (PROTONIX) EC tablet 40 mg  40 mg Oral QAC breakfast Aldean Jewett, MD   40 mg at 05/30/15 0947  . polyethylene glycol (MIRALAX / GLYCOLAX) packet 17 g  17 g Oral Daily PRN Aldean Jewett, MD      . pravastatin (PRAVACHOL) tablet 40 mg  40 mg Oral q1800 Aldean Jewett, MD   40 mg at 05/30/15 1735  . QUEtiapine (SEROQUEL) tablet 25 mg  25 mg Oral QHS Aruna Gouru, MD      . sodium chloride 0.9 % injection 3 mL  3 mL Intravenous Q12H Aldean Jewett, MD   3 mL at 05/30/15 0947    Musculoskeletal: Strength & Muscle Tone: decreased Gait & Station: unsteady Patient leans: N/A  Psychiatric Specialty Exam: Review of Systems  Unable to perform ROS: mental acuity    Blood pressure 121/61, pulse 66, temperature 98 F (36.7 C), temperature source Oral, resp. rate 16, height 4' 9"  (1.448 m), weight 67.132 kg (148 lb), SpO2 100 %.Body mass index is 32.02 kg/(m^2).  General Appearance: Casual  Eye Contact::  Minimal  Speech:  Garbled  Volume:  Decreased  Mood:  Euthymic  Affect:  Flat  Thought Process:  Tangential  Orientation:  Negative  Thought Content:  Negative  Suicidal Thoughts:  No   Homicidal Thoughts:  No  Memory:  Immediate;   Poor Recent;   Poor Remote;   Poor  Judgement:  Impaired  Insight:  Lacking  Psychomotor Activity:  Decreased  Concentration:  Poor  Recall:  Poor  Fund of Knowledge:Poor  Language: Poor  Akathisia:  Negative  Handed:  Right  AIMS (if indicated):     Assets:  Housing Social Support  ADL's:  Impaired  Cognition: Impaired,  Moderate and Severe  Sleep:      Treatment Plan Summary: Plan Patient appears to be having an acute quiet delirium. This on top of chronic dementia. No evidence to suggest mood disorder. No other known medical cause no evidence of stroke. Case discussed with the daughter. I told her that in this case I would anticipate that as the urinary tract infection improves the patient is likely to return to her baseline mental state. No indication to had any psychiatric medicine at this time. Psychoeducation completed. I will follow-up if needed.  Disposition: Supportive therapy provided about ongoing stressors.  Charlotte Brafford 05/30/2015 8:12 PM

## 2015-05-30 NOTE — Progress Notes (Signed)
Medication CONSULT NOTE - INITIAL  Pharmacy Consult for Renal Dosing for ABX Indication: bacteremia  Allergies  Allergen Reactions  . Fexofenadine Hcl Rash    Patient Measurements: Height:  (144.8 cm) Weight: 148 lb (67.132 kg) IBW/kg (Calculated) : 38.6  Vital Signs: Temp: 98 F (36.7 C) (11/10 1107) Temp Source: Oral (11/10 1107) BP: 121/61 mmHg (11/10 1107) Pulse Rate: 66 (11/10 1107) Intake/Output from previous day: 11/09 0701 - 11/10 0700 In: 3 [I.V.:3] Out: 5 [Urine:5] Intake/Output from this shift: Total I/O In: 120 [P.O.:120] Out: 0   Labs:  Recent Labs  05/29/15 0429 05/30/15 1434  CREATININE 2.07* 1.80*   Estimated Creatinine Clearance: 21.6 mL/min (by C-G formula based on Cr of 1.8).  Microbiology: Recent Results (from the past 720 hour(s))  Urine culture     Status: None   Collection Time: 05/24/15  8:07 AM  Result Value Ref Range Status   Specimen Description URINE, CATHETERIZED  Final   Special Requests NONE  Final   Culture >=100,000 COLONIES/mL ESCHERICHIA COLI  Final   Report Status 05/26/2015 FINAL  Final   Organism ID, Bacteria ESCHERICHIA COLI  Final      Susceptibility   Escherichia coli - MIC*    AMPICILLIN >=32 RESISTANT Resistant     CEFTAZIDIME <=1 SENSITIVE Sensitive     CEFAZOLIN <=4 SENSITIVE Sensitive     CEFTRIAXONE <=1 SENSITIVE Sensitive     CIPROFLOXACIN <=0.25 SENSITIVE Sensitive     GENTAMICIN <=1 SENSITIVE Sensitive     IMIPENEM <=0.25 SENSITIVE Sensitive     TRIMETH/SULFA <=20 SENSITIVE Sensitive     NITROFURANTOIN Value in next row Sensitive      SENSITIVE<=16    PIP/TAZO Value in next row Sensitive      SENSITIVE<=4    LEVOFLOXACIN Value in next row Sensitive      SENSITIVE<=0.12    * >=100,000 COLONIES/mL ESCHERICHIA COLI  Culture, blood (routine x 2)     Status: None   Collection Time: 05/24/15  8:08 AM  Result Value Ref Range Status   Specimen Description BLOOD LEFT AC  Final   Special Requests  BOTTLES DRAWN AEROBIC AND ANAEROBIC  2CC  Final   Culture  Setup Time   Final    GRAM NEGATIVE RODS ANAEROBIC BOTTLE ONLY CRITICAL RESULT CALLED TO, READ BACK BY AND VERIFIED WITH: TIFFANY MADDOX AT 2229 05/24/15 SDR CONFIRMED BY RW    Culture ESCHERICHIA COLI ANAEROBIC BOTTLE ONLY   Final   Report Status 05/28/2015 FINAL  Final   Organism ID, Bacteria ESCHERICHIA COLI  Final      Susceptibility   Escherichia coli - MIC*    AMPICILLIN >=32      CEFTAZIDIME <=1 SENSITIVE Sensitive     CEFAZOLIN <=4 SENSITIVE Sensitive     CEFTRIAXONE <=1 SENSITIVE Sensitive     CIPROFLOXACIN <=0.25 SENSITIVE Sensitive     GENTAMICIN <=1 SENSITIVE Sensitive     IMIPENEM <=0.25 SENSITIVE Sensitive     TRIMETH/SULFA <=20 SENSITIVE Sensitive     PIP/TAZO Value in next row Sensitive      SENSITIVE<=4    ERTAPENEM Value in next row Sensitive      SENSITIVE<=0.5    LEVOFLOXACIN Value in next row Sensitive      SENSITIVE<=0.12    * ESCHERICHIA COLI  Culture, blood (routine x 2)     Status: None   Collection Time: 05/24/15  8:56 AM  Result Value Ref Range Status   Specimen Description BLOOD RIGHT  AC  Final   Special Requests BOTTLES DRAWN AEROBIC AND ANAEROBIC  1CC  Final   Culture  Setup Time   Final    GRAM NEGATIVE RODS IN BOTH AEROBIC AND ANAEROBIC BOTTLES CRITICAL RESULT CALLED TO, READ BACK BY AND VERIFIED WITH: TIFFANY MADDOX AT 2229 05/24/15 SDR CONFIRMED BY RW    Culture   Final    ESCHERICHIA COLI IN BOTH AEROBIC AND ANAEROBIC BOTTLES    Report Status 05/28/2015 FINAL  Final   Organism ID, Bacteria ESCHERICHIA COLI  Final      Susceptibility   Escherichia coli - MIC*    AMPICILLIN >=32 RESISTANT Resistant     CEFTAZIDIME <=1 SENSITIVE Sensitive     CEFAZOLIN <=4 SENSITIVE Sensitive     CEFTRIAXONE <=1 SENSITIVE Sensitive     CIPROFLOXACIN <=0.25 SENSITIVE Sensitive     GENTAMICIN <=1 SENSITIVE Sensitive     IMIPENEM <=0.25 SENSITIVE Sensitive     TRIMETH/SULFA <=20 SENSITIVE  Sensitive     PIP/TAZO Value in next row Sensitive      SENSITIVE<=4    ERTAPENEM Value in next row Sensitive      SENSITIVE<=0.5    LEVOFLOXACIN Value in next row Sensitive      SENSITIVE<=0.12    * ESCHERICHIA COLI    Medical History: Past Medical History  Diagnosis Date  . Diabetes mellitus   . Hypertension   . CVA (cerebral infarction)   . GERD (gastroesophageal reflux disease)   . HLD (hyperlipidemia)   . Heart murmur   . Carotid artery occlusion 02/15/2009  . Stroke ([facility])   . Dementia     Medications:  Scheduled:  . allopurinol  100 mg Oral Daily  . amLODipine  10 mg Oral Daily  . aspirin  325 mg Oral BH-q7a  . cloNIDine  0.1 mg Transdermal Once  . divalproex  250 mg Oral Q12H  . enoxaparin (LOVENOX) injection  30 mg Subcutaneous Q24H  . insulin aspart  0-15 Units Subcutaneous TID WC  . insulin aspart  0-5 Units Subcutaneous QHS  . insulin aspart  6 Units Subcutaneous TID WC  . insulin glargine  30 Units Subcutaneous QHS  . [START ON 05/31/2015] levofloxacin  750 mg Oral Q48H  . levothyroxine  88 mcg Oral QAC breakfast  . metoprolol tartrate  25 mg Oral BID  . pantoprazole  40 mg Oral QAC breakfast  . pravastatin  40 mg Oral q1800  . sodium chloride  3 mL Intravenous Q12H   Assessment: [name] [name] is a [age] female presenting from facility with AMS and found to have bacteremia. Due to severe renal dysfunction, pharmacy consulted to renally adjust medications.   Plan:  Patient previously on Levaquin 250mg , dose increased to Levaquin 750mg  due to discovered bacteremia.   Dose renally adjusted due to CrCl= 21.6 ml/min. New regimen Levaquin 750mg  PO q48hrs.   Continue to watch for changes in renal function (If CrCl <20 ml/min, adjust dose further).  Pharmacy will continue to monitor.   Cy BlamerAllison K Dimitria Ketchum 05/30/2015,4:36 PM

## 2015-05-30 NOTE — Clinical Social Work Note (Signed)
MD has informed CSW that patient will not discharge today [facility] due to kidney function not stable. CSW has informed Sue Lushndrea at [facility]. York SpanielMonica Dollie Bressi MSW,LCSW [phone]

## 2015-05-31 LAB — BASIC METABOLIC PANEL
Anion gap: 5 (ref 5–15)
BUN: 40 mg/dL — AB (ref 6–20)
CALCIUM: 7.8 mg/dL — AB (ref 8.9–10.3)
CO2: 26 mmol/L (ref 22–32)
Chloride: 110 mmol/L (ref 101–111)
Creatinine, Ser: 1.44 mg/dL — ABNORMAL HIGH (ref 0.44–1.00)
GFR calc Af Amer: 40 mL/min — ABNORMAL LOW (ref >60)
GFR, EST NON AFRICAN AMERICAN: 35 mL/min — AB (ref >60)
GLUCOSE: 97 mg/dL (ref 65–99)
Potassium: 3.5 mmol/L (ref 3.5–5.1)
Sodium: 141 mmol/L (ref 135–145)

## 2015-05-31 LAB — GLUCOSE, CAPILLARY
Glucose-Capillary: 94 mg/dL (ref 65–99)
Glucose-Capillary: 224 mg/dL — ABNORMAL HIGH (ref 65–99)

## 2015-05-31 MED ORDER — LEVOFLOXACIN 750 MG PO TABS: 750.0000 mg | ORAL_TABLET | ORAL | Status: AC

## 2015-05-31 MED ORDER — AMLODIPINE BESYLATE 10 MG PO TABS: 5.0000 mg | ORAL_TABLET | Freq: Every day | ORAL | Status: DC

## 2015-05-31 MED ORDER — TRAMADOL-ACETAMINOPHEN 37.5-325 MG PO TABS: 1.0000 | ORAL_TABLET | Freq: Three times a day (TID) | ORAL | Status: DC | PRN

## 2015-05-31 MED ORDER — INSULIN ASPART 100 UNIT/ML ~~LOC~~ SOLN: 3.0000 [IU] | Freq: Three times a day (TID) | SUBCUTANEOUS | Status: DC

## 2015-05-31 MED ORDER — INSULIN GLARGINE 100 UNIT/ML ~~LOC~~ SOLN: 27.0000 [IU] | Freq: Every day | SUBCUTANEOUS | Status: DC

## 2015-05-31 MED ORDER — INSULIN ASPART 100 UNIT/ML ~~LOC~~ SOLN: 6.0000 [IU] | Freq: Three times a day (TID) | SUBCUTANEOUS | Status: DC

## 2015-05-31 NOTE — Discharge Instructions (Signed)
Activity as recommended by physical therapy Diet healthy heart, diabetic Follow-up with primary care physician at facility in 3-5 days Follow-up with primary cardiologist in 1 month or sooner if needed  Urinary Tract Infection A urinary tract infection (UTI) can occur any place along the urinary tract. The tract includes the kidneys, ureters, bladder, and urethra. A type of germ called bacteria often causes a UTI. UTIs are often helped with antibiotic medicine.  HOME CARE   If given, take antibiotics as told by your doctor. Finish them even if you start to feel better.  Drink enough fluids to keep your pee (urine) clear or pale yellow.  Avoid tea, drinks with caffeine, and bubbly (carbonated) drinks.  Pee often. Avoid holding your pee in for a long time.  Pee before and after having sex (intercourse).  Wipe from front to back after you poop (bowel movement) if you are a woman. Use each tissue only once. GET HELP RIGHT AWAY IF:   You have back pain.  You have lower belly (abdominal) pain.  You have chills.  You feel sick to your stomach (nauseous).  You throw up (vomit).  Your burning or discomfort with peeing does not go away.  You have a fever.  Your symptoms are not better in 3 days. MAKE SURE YOU:   Understand these instructions.  Will watch your condition.  Will get help right away if you are not doing well or get worse.   This information is not intended to replace advice given to you by your health care provider. Make sure you discuss any questions you have with your health care provider.   Document Released: 12/23/2007 Document Revised: 07/27/2014 Document Reviewed: 02/04/2012 Elsevier Interactive Patient Education [employer]2016 Elsevier Inc.

## 2015-05-31 NOTE — Care Management (Signed)
Patient to discharge today to [facility]

## 2015-05-31 NOTE — Care Management Important Message (Signed)
Important Message  Patient Details  Name: [name] CalamitySophie A Dubuc MRN: [id number] Date of Birth: August 02, 1940   Medicare Important Message Given:  Yes    Chapman FitchBOWEN, Jean Skow T, RN 05/31/2015, 9:03 AM

## 2015-05-31 NOTE — Progress Notes (Addendum)
Pt. Discharging to [facility] via EMS. Report given to Hess CorporationSharon RN at [facility]. EMS has been notified. Okey RegalCarol, patients daughter called and updated.  Discharge instructions and medication regimen printed and included with transfer packet, including printed prescriptions Patient assessment unchanged from this morning. TELE and IV discontinued per policy.

## 2015-05-31 NOTE — Clinical Social Work Note (Signed)
Patient to discharge today [facility]. Discharge info sent to [facility] at [facility]. CSW spoke with patient's daughter, Okey RegalCarol, via phone and she is aware and in agreement and requests EMS transport. Patient's nurse is aware. York SpanielMonica Kesha Hurrell MSW,LCSW [phone]

## 2015-05-31 NOTE — Clinical Social Work Placement (Signed)
   CLINICAL SOCIAL WORK PLACEMENT  NOTE  Date:  05/31/2015  Patient Details  Name: [name] CalamitySophie A [name] MRN: [id number] Date of Birth: 26-Apr-1941  Clinical Social Work is seeking post-discharge placement for this patient at the Skilled  Nursing Facility level of care (*CSW will initial, date and re-position this form in  chart as items are completed):  Yes   Patient/family provided with [facility] Clinical Social Work Department's list of facilities offering this level of care within the geographic area requested by the patient (or if unable, by the patient's family).  Yes   Patient/family informed of their freedom to choose among providers that offer the needed level of care, that participate in Medicare, Medicaid or managed care program needed by the patient, have an available bed and are willing to accept the patient.  Yes   Patient/family informed of [facility]'s ownership interest in [facility] and [facility], as well as of the fact that they are under no obligation to receive care at these facilities.  PASRR submitted to EDS on       PASRR number received on       Existing PASRR number confirmed on 05/26/15     FL2 transmitted to all facilities in geographic area requested by pt/family on 05/26/15     FL2 transmitted to all facilities within larger geographic area on       Patient informed that his/her managed care company has contracts with or will negotiate with certain facilities, including the following:        Yes   Patient/family informed of bed offers received.  Patient chooses bed at  (D[facility])     Physician recommends and patient chooses bed at  [facility](SNF)    Patient to be transferred to  [facility] on 05/31/15.  Patient to be transferred to facility by  (EMS)     Patient family notified on 05/31/15 of transfer.  Name of family member notified:        PHYSICIAN Please sign FL2     Additional Comment:     _______________________________________________ York SpanielMonica Wynn Alldredge, LCSW 05/31/2015, 1:04 PM

## 2015-06-03 DIAGNOSIS — F39 Unspecified mood [affective] disorder: Secondary | ICD-10-CM

## 2015-06-03 DIAGNOSIS — G8193 Hemiplegia, unspecified affecting right nondominant side: Secondary | ICD-10-CM

## 2015-06-03 DIAGNOSIS — E1142 Type 2 diabetes mellitus with diabetic polyneuropathy: Secondary | ICD-10-CM

## 2015-06-03 DIAGNOSIS — I6523 Occlusion and stenosis of bilateral carotid arteries: Secondary | ICD-10-CM | POA: Diagnosis not present

## 2015-06-03 DIAGNOSIS — R6520 Severe sepsis without septic shock: Secondary | ICD-10-CM | POA: Diagnosis not present

## 2015-07-02 DIAGNOSIS — F0151 Vascular dementia with behavioral disturbance: Secondary | ICD-10-CM | POA: Diagnosis not present

## 2015-07-02 DIAGNOSIS — G8193 Hemiplegia, unspecified affecting right nondominant side: Secondary | ICD-10-CM | POA: Diagnosis not present

## 2015-07-02 DIAGNOSIS — K219 Gastro-esophageal reflux disease without esophagitis: Secondary | ICD-10-CM | POA: Diagnosis not present

## 2015-07-02 DIAGNOSIS — F39 Unspecified mood [affective] disorder: Secondary | ICD-10-CM

## 2015-07-02 DIAGNOSIS — E1142 Type 2 diabetes mellitus with diabetic polyneuropathy: Secondary | ICD-10-CM | POA: Diagnosis not present

## 2015-08-07 DIAGNOSIS — E039 Hypothyroidism, unspecified: Secondary | ICD-10-CM | POA: Diagnosis not present

## 2015-08-07 DIAGNOSIS — E785 Hyperlipidemia, unspecified: Secondary | ICD-10-CM | POA: Diagnosis not present

## 2015-08-07 DIAGNOSIS — K219 Gastro-esophageal reflux disease without esophagitis: Secondary | ICD-10-CM | POA: Diagnosis not present

## 2015-08-07 DIAGNOSIS — E1140 Type 2 diabetes mellitus with diabetic neuropathy, unspecified: Secondary | ICD-10-CM

## 2015-08-07 DIAGNOSIS — F39 Unspecified mood [affective] disorder: Secondary | ICD-10-CM

## 2015-08-07 DIAGNOSIS — I10 Essential (primary) hypertension: Secondary | ICD-10-CM | POA: Diagnosis not present

## 2015-08-07 DIAGNOSIS — I69351 Hemiplegia and hemiparesis following cerebral infarction affecting right dominant side: Secondary | ICD-10-CM

## 2015-08-07 DIAGNOSIS — F0150 Vascular dementia without behavioral disturbance: Secondary | ICD-10-CM

## 2015-09-04 ENCOUNTER — Emergency Department
Admission: EM | Admit: 2015-09-04 | Discharge: 2015-09-05 | Disposition: A | Discharge status: Home / Self Care | Payer: Medicare Other | Attending: Emergency Medicine | Admitting: Emergency Medicine

## 2015-09-04 DIAGNOSIS — R51 Headache: Secondary | ICD-10-CM | POA: Insufficient documentation

## 2015-09-04 DIAGNOSIS — E119 Type 2 diabetes mellitus without complications: Secondary | ICD-10-CM | POA: Insufficient documentation

## 2015-09-04 DIAGNOSIS — N189 Chronic kidney disease, unspecified: Secondary | ICD-10-CM | POA: Insufficient documentation

## 2015-09-04 DIAGNOSIS — R1110 Vomiting, unspecified: Secondary | ICD-10-CM

## 2015-09-04 DIAGNOSIS — Z7982 Long term (current) use of aspirin: Secondary | ICD-10-CM | POA: Insufficient documentation

## 2015-09-04 DIAGNOSIS — R112 Nausea with vomiting, unspecified: Secondary | ICD-10-CM | POA: Diagnosis present

## 2015-09-04 DIAGNOSIS — Z87891 Personal history of nicotine dependence: Secondary | ICD-10-CM | POA: Diagnosis not present

## 2015-09-04 DIAGNOSIS — I129 Hypertensive chronic kidney disease with stage 1 through stage 4 chronic kidney disease, or unspecified chronic kidney disease: Secondary | ICD-10-CM | POA: Diagnosis not present

## 2015-09-04 DIAGNOSIS — Z794 Long term (current) use of insulin: Secondary | ICD-10-CM | POA: Insufficient documentation

## 2015-09-04 DIAGNOSIS — Z79899 Other long term (current) drug therapy: Secondary | ICD-10-CM | POA: Insufficient documentation

## 2015-09-04 DIAGNOSIS — R1011 Right upper quadrant pain: Secondary | ICD-10-CM

## 2015-09-04 DIAGNOSIS — K8020 Calculus of gallbladder without cholecystitis without obstruction: Secondary | ICD-10-CM | POA: Insufficient documentation

## 2015-09-04 MED ORDER — LABETALOL HCL 5 MG/ML IV SOLN
10.0000 mg | Freq: Once | INTRAVENOUS | Status: AC
  Administered 2015-09-04: 10 mg via INTRAVENOUS
  Filled 2015-09-04: qty 4

## 2015-09-04 NOTE — ED Notes (Addendum)
Pt to ED via [facility] from [facility] with c/o N/V since noon today. EMS reports staff stated pt had at least 6 episodes of emesis since noon. EMS also reports pt given Phenergan 1 hr PTA (pt takes Phenergan regularly for GERD). Pt is A&O, in NAD, with respirations even, regular and unlabored. Pt vomited x1 during triage assessment (small amount of green/yellow liquid). EMS reports BP 160/90, CBG 325.

## 2015-09-05 ENCOUNTER — Emergency Department: Payer: Medicare Other

## 2015-09-05 ENCOUNTER — Telehealth: Payer: Self-pay

## 2015-09-05 DIAGNOSIS — E1142 Type 2 diabetes mellitus with diabetic polyneuropathy: Secondary | ICD-10-CM

## 2015-09-05 DIAGNOSIS — F0151 Vascular dementia with behavioral disturbance: Secondary | ICD-10-CM

## 2015-09-05 DIAGNOSIS — K8020 Calculus of gallbladder without cholecystitis without obstruction: Secondary | ICD-10-CM | POA: Diagnosis not present

## 2015-09-05 DIAGNOSIS — G8193 Hemiplegia, unspecified affecting right nondominant side: Secondary | ICD-10-CM

## 2015-09-05 DIAGNOSIS — F39 Unspecified mood [affective] disorder: Secondary | ICD-10-CM

## 2015-09-05 DIAGNOSIS — R1084 Generalized abdominal pain: Secondary | ICD-10-CM | POA: Diagnosis not present

## 2015-09-05 LAB — CBC WITH DIFFERENTIAL/PLATELET
BASOS ABS: 0 10*3/uL (ref 0–0.1)
Basophils Relative: 1 %
Eosinophils Absolute: 0 10*3/uL (ref 0–0.7)
Eosinophils Relative: 0 %
HEMATOCRIT: 44.7 % (ref 35.0–47.0)
Hemoglobin: 14.5 g/dL (ref 12.0–16.0)
LYMPHS PCT: 9 %
Lymphs Abs: 0.9 10*3/uL — ABNORMAL LOW (ref 1.0–3.6)
MCH: 27.1 pg (ref 26.0–34.0)
MCHC: 32.4 g/dL (ref 32.0–36.0)
MCV: 83.5 fL (ref 80.0–100.0)
MONO ABS: 0.3 10*3/uL (ref 0.2–0.9)
Monocytes Relative: 3 %
NEUTROS ABS: 8.8 10*3/uL — AB (ref 1.4–6.5)
Neutrophils Relative %: 87 %
Platelets: 271 10*3/uL (ref 150–440)
RBC: 5.35 MIL/uL — AB (ref 3.80–5.20)
RDW: 14 % (ref 11.5–14.5)
WBC: 10 10*3/uL (ref 3.6–11.0)

## 2015-09-05 LAB — COMPREHENSIVE METABOLIC PANEL
ALT: 14 U/L (ref 14–54)
AST: 19 U/L (ref 15–41)
Albumin: 3.9 g/dL (ref 3.5–5.0)
Alkaline Phosphatase: 130 U/L — ABNORMAL HIGH (ref 38–126)
Anion gap: 12 (ref 5–15)
BILIRUBIN TOTAL: 0.9 mg/dL (ref 0.3–1.2)
BUN: 13 mg/dL (ref 6–20)
CALCIUM: 9.2 mg/dL (ref 8.9–10.3)
CHLORIDE: 100 mmol/L — AB (ref 101–111)
CO2: 25 mmol/L (ref 22–32)
CREATININE: 0.95 mg/dL (ref 0.44–1.00)
GFR calc Af Amer: >60 mL/min (ref >60)
GFR, EST NON AFRICAN AMERICAN: 57 mL/min — AB (ref >60)
GLUCOSE: 327 mg/dL — AB (ref 65–99)
Potassium: 3.6 mmol/L (ref 3.5–5.1)
Sodium: 137 mmol/L (ref 135–145)
Total Protein: 8.1 g/dL (ref 6.5–8.1)

## 2015-09-05 LAB — LIPASE, BLOOD: Lipase: 16 U/L (ref 11–51)

## 2015-09-05 MED ORDER — ONDANSETRON HCL 4 MG PO TABS: 4.0000 mg | ORAL_TABLET | Freq: Every day | ORAL | Status: AC | PRN

## 2015-09-05 NOTE — ED Notes (Signed)
Pt returned to ED Rm 15 from [place].

## 2015-09-05 NOTE — Telephone Encounter (Signed)
Per chart review tab pt was seen [facility] ED on 09/04/15.

## 2015-09-05 NOTE — Telephone Encounter (Signed)
PLEASE NOTE: All timestamps contained within this report are represented as [place] Standard Time. CONFIDENTIALTY NOTICE: This fax transmission is intended only for the addressee. It contains information that is legally privileged, confidential or otherwise protected from use or disclosure. If you are not the intended recipient, you are strictly prohibited from reviewing, disclosing, copying using or disseminating any of this information or taking any action in reliance on or regarding this information. If you have received this fax in error, please notify us immediately by telephone so that we can arrange for its return to [place]. Phone: [phone], Toll-Free: [phone], Fax: [phone] Page: 1 of 1 Call Id: [id number] [facility] Day - Client Nonclinical Telephone Record [facility] Client [facility] [place] Day - Client Client Site [facility] [place] - Day Physician Tillman Abide Contact Type Call Who Is Calling Physician / Provider / Hospital Call Type Provider Call Message Only Reason for Call Request to speak to Physician Initial Comment Caller states is Virl Cagey [facility] is needing to speak to the Dr to see if patient needs to be sent out to the hospital. Patient Name [name] [name] Patient DOB 1940-12-16 Physician Number [phone] Facility Name [facility] Call Closed By: Macario Carls Transaction Date/Time: 09/04/2015 9:23:50 PM (ET)

## 2015-09-05 NOTE — Telephone Encounter (Signed)
I did check in her this morning Seems okay---will watch

## 2015-09-05 NOTE — ED Provider Notes (Signed)
[facility] Emergency Department Provider Note  ____________________________________________  Time seen: 12:05 AM  I have reviewed the triage vital signs and the nursing notes.   HISTORY  Chief Complaint Emesis      HPI [name] [name] is a 75 y.o. female presents with nausea vomiting since noon yesterday. Patient admits to multiple episodes of nonbloody emesis. Per history patient takes Phenergan as needed for GERD. Patient denies any abdominal pain at this time however does admit to some abdominal discomfort earlier     Past Medical History  Diagnosis Date  . Diabetes mellitus   . Hypertension   . CVA (cerebral infarction)   . GERD (gastroesophageal reflux disease)   . HLD (hyperlipidemia)   . Heart murmur   . Carotid artery occlusion 02/15/2009  . Stroke (HCC)   . Dementia     Patient Active Problem List   Diagnosis Date Noted  . Acute delirium 05/30/2015  . Alzheimer's disease 05/30/2015  . Altered mental status 05/24/2015  . NSTEMI (non-ST elevated myocardial infarction) (HCC) 05/24/2015  . Occlusion and stenosis of carotid artery without mention of cerebral infarction 10/08/2011  . CKD (chronic kidney disease) 09/25/2011  . Nausea and vomiting 09/22/2011  . HTN (hypertension) 09/22/2011  . H/O: CVA (cardiovascular accident) 09/22/2011  . Diabetes mellitus, type 2 (HCC) 09/22/2011    Past Surgical History  Procedure Laterality Date  . Appendectomy    . Tonsillectomy      Current Outpatient Rx  Name  Route  Sig  Dispense  Refill  . acetaminophen (TYLENOL) 325 MG tablet   Oral   Take 2 tablets (650 mg total) by mouth every 6 (six) hours as needed for mild pain (or Fever >/= 101).         Marland Kitchen allopurinol (ZYLOPRIM) 100 MG tablet   Oral   Take 100 mg by mouth daily.         Marland Kitchen amLODipine (NORVASC) 10 MG tablet   Oral   Take 0.5 tablets (5 mg total) by mouth daily.   15 tablet   0   . aspirin 325 MG tablet   Oral   Take  325 mg by mouth every morning.          . Calcium Carb-Cholecalciferol (CALCIUM 600+D3) 600-400 MG-UNIT TABS   Oral   Take 1 tablet by mouth 2 (two) times daily with a meal.         . cloNIDine (CATAPRES - DOSED IN MG/24 HR) 0.1 mg/24hr patch   Transdermal   Place 1 patch (0.1 mg total) onto the skin once.   4 patch   12   . divalproex (DEPAKOTE) 250 MG DR tablet   Oral   Take 250 mg by mouth 2 (two) times daily.         . furosemide (LASIX) 40 MG tablet   Oral   Take 0.5 tablets (20 mg total) by mouth daily.   30 tablet   0   . gabapentin (NEURONTIN) 300 MG capsule   Oral   Take 300 mg by mouth at bedtime.         . insulin aspart (NOVOLOG) 100 UNIT/ML injection   Subcutaneous   Inject 3 Units into the skin 3 (three) times daily with meals.   1 vial      . insulin glargine (LANTUS) 100 UNIT/ML injection   Subcutaneous   Inject 0.27 mLs (27 Units total) into the skin at bedtime.   10 mL  11   . levothyroxine (SYNTHROID, LEVOTHROID) 88 MCG tablet   Oral   Take 88 mcg by mouth daily before breakfast.         . lovastatin (MEVACOR) 20 MG tablet   Oral   Take 20 mg by mouth every evening.          . meclizine (ANTIVERT) 25 MG tablet   Oral   Take 1 tablet (25 mg total) by mouth 3 (three) times daily as needed for dizziness or nausea.   30 tablet   1   . metoCLOPramide (REGLAN) 5 MG tablet   Oral   Take 5 mg by mouth 4 (four) times daily -  before meals and at bedtime.          . metoprolol tartrate (LOPRESSOR) 25 MG tablet   Oral   Take 1 tablet (25 mg total) by mouth 2 (two) times daily.   60 tablet   0   . omeprazole (PRILOSEC) 20 MG capsule   Oral   Take 20 mg by mouth 2 (two) times daily.         . potassium chloride (K-DUR) 10 MEQ tablet   Oral   Take 10 mEq by mouth daily. With furosemide         . QUEtiapine (SEROQUEL) 25 MG tablet   Oral   Take 25 mg by mouth at bedtime.         . traMADol-acetaminophen (ULTRACET)  37.5-325 MG tablet   Oral   Take 1 tablet by mouth every 8 (eight) hours as needed for moderate pain or severe pain.   30 tablet   0     Allergies Fexofenadine hcl  Family History  Problem Relation Age of Onset  . Diabetes Mother   . Hypertension Mother   . Diabetes Father   . Hypertension Father   . Diabetes Sister   . Hypertension Sister     Social History Social History  Substance Use Topics  . Smoking status: Former Smoker    Types: Cigarettes    Quit date: 07/20/1985  . Smokeless tobacco: Never Used  . Alcohol Use: No    Review of Systems  Constitutional: Negative for fever. Eyes: Negative for visual changes. ENT: Negative for sore throat. Cardiovascular: Negative for chest pain. Respiratory: Negative for shortness of breath. Gastrointestinal: Negative for abdominal pain. Positive for vomiting Genitourinary: Negative for dysuria. Musculoskeletal: Negative for back pain. Skin: Negative for rash. Neurological: Negative for headaches, focal weakness or numbness.   10-point ROS otherwise negative.  ____________________________________________   PHYSICAL EXAM:  VITAL SIGNS: ED Triage Vitals  Enc Vitals Group     BP 09/04/15 2225 200/121 mmHg     Pulse Rate 09/04/15 2225 104     Resp 09/04/15 2225 25     Temp 09/04/15 2225 98.1 F (36.7 C)     Temp Source 09/04/15 2225 Oral     SpO2 09/04/15 2225 100 %     Weight 09/04/15 2225 140 lb (63.504 kg)     Height 09/04/15 2225  (1.499 m)     Head Cir --      Peak Flow --      Pain Score 09/04/15 2226 0     Pain Loc --      Pain Edu? --      Excl. in GC? --      Constitutional: Alert and oriented. Well appearing and in no distress. Eyes: Conjunctivae are normal. PERRL. Normal extraocular movements.  ENT   Head: Normocephalic and atraumatic.   Nose: No congestion/rhinnorhea.   Mouth/Throat: Mucous membranes are moist.   Neck: No stridor. Hematological/Lymphatic/Immunilogical: No  cervical lymphadenopathy. Cardiovascular: Normal rate, regular rhythm. Normal and symmetric distal pulses are present in all extremities. No murmurs, rubs, or gallops. Respiratory: Normal respiratory effort without tachypnea nor retractions. Breath sounds are clear and equal bilaterally. No wheezes/rales/rhonchi. Gastrointestinal: Soft and nontender. No distention. There is no CVA tenderness. Genitourinary: deferred Musculoskeletal: Nontender with normal range of motion in all extremities. No joint effusions.  No lower extremity tenderness nor edema. Neurologic:  Normal speech and language. No gross focal neurologic deficits are appreciated. Speech is normal.  Skin:  Skin is warm, dry and intact. No rash noted. Psychiatric: Mood and affect are normal. Speech and behavior are normal. Patient exhibits appropriate insight and judgment.  ____________________________________________    LABS (pertinent positives/negatives)  Labs Reviewed  CBC WITH DIFFERENTIAL/PLATELET - Abnormal; Notable for the following:    RBC 5.35 (*)    Neutro Abs 8.8 (*)    Lymphs Abs 0.9 (*)    All other components within normal limits  COMPREHENSIVE METABOLIC PANEL - Abnormal; Notable for the following:    Chloride 100 (*)    Glucose, Bld 327 (*)    Alkaline Phosphatase 130 (*)    GFR calc non Af Amer 57 (*)    All other components within normal limits  LIPASE, BLOOD  URINALYSIS COMPLETEWITH MICROSCOPIC ([facility] ONLY)       RADIOLOGY    Imaging Results       US Abdomen Limited RUQ (Final result) Result time: 09/05/15 02:50:29   Final result by Rad Results In Interface (09/05/15 02:50:29)   Narrative:   CLINICAL DATA: 75 year old female with right upper quadrant abdominal pain  EXAM: US ABDOMEN LIMITED - RIGHT UPPER QUADRANT  COMPARISON: None.  FINDINGS: Gallbladder:  There multiple stones within the gallbladder. There is no gallbladder wall thickening or pericholecystic fluid.  Negative sonographic Murphy's sign.  Common bile duct:  Diameter: 4 mm  Liver:  No focal lesion identified. Within normal limits in parenchymal echogenicity.  IMPRESSION: Cholelithiasis without sonographic evidence of acute cholecystitis. A hepatobiliary scintigraphy may provide better evaluation of the gallbladder if an acute cholecystitis is clinically suspected.   Electronically Signed By: Elgie Collard M.D. On: 09/05/2015 02:50          CT Head Wo Contrast (Final result) Result time: 09/05/15 01:08:45   Final result by Rad Results In Interface (09/05/15 01:08:45)   Narrative:   CLINICAL DATA: Nausea and vomiting since noon today. Hypertension. History of stroke and dementia.  EXAM: CT HEAD WITHOUT CONTRAST  TECHNIQUE: Contiguous axial images were obtained from the base of the skull through the vertex without intravenous contrast.  COMPARISON: MRI brain 05/24/2015. CT head 05/24/2015.  FINDINGS: Diffuse cerebral atrophy. Ventricular dilatation likely due to central atrophy. Diffuse cerebellar atrophy. Low-attenuation changes throughout the deep white matter consistent with small vessel ischemia. Low-attenuation changes extend out to the cortical surface in the right frontoparietal region suggesting old infarct in this location. No significant change since previous study. No mass effect or midline shift. No abnormal extra-axial fluid collections. Gray-white matter junctions are distinct. Basal cisterns are not effaced. No evidence of acute intracranial hemorrhage. No depressed skull fractures. Visualized paranasal sinuses and mastoid air cells are not opacified.  IMPRESSION: No acute intracranial abnormalities. Diffuse chronic atrophy and small vessel ischemic changes. Old right MCA territory infarct.   Electronically Signed By: Chrissie Noa  Andria Meuse M.D. On: 09/05/2015 01:08     INITIAL IMPRESSION / ASSESSMENT AND PLAN / ED  COURSE  Pertinent labs & imaging results that were available during my care of the patient were reviewed by me and considered in my medical decision making (see chart for details).  History physical exam consistent with cholelithiasis confirmed by ultrasound. No evidence of cholecystitis noted ____________________________________________   FINAL CLINICAL IMPRESSION(S) / ED DIAGNOSES  Final diagnoses:  Vomiting  RUQ pain  Calculus of gallbladder without cholecystitis without obstruction      Darci Current, MD 09/05/15 [phone]

## 2015-09-05 NOTE — Discharge Instructions (Signed)

## 2015-09-20 DIAGNOSIS — B351 Tinea unguium: Secondary | ICD-10-CM | POA: Diagnosis not present

## 2015-10-08 DIAGNOSIS — J069 Acute upper respiratory infection, unspecified: Secondary | ICD-10-CM | POA: Diagnosis not present

## 2015-10-16 ENCOUNTER — Emergency Department: Payer: Medicare Other

## 2015-10-16 ENCOUNTER — Inpatient Hospital Stay
Admission: EM | Admit: 2015-10-16 | Discharge: 2015-10-17 | DRG: 379 | Disposition: A | Discharge status: Other Acute Inpatient Hospital | Payer: Medicare Other | Attending: Internal Medicine | Admitting: Internal Medicine

## 2015-10-16 DIAGNOSIS — I10 Essential (primary) hypertension: Secondary | ICD-10-CM | POA: Diagnosis present

## 2015-10-16 DIAGNOSIS — Z87891 Personal history of nicotine dependence: Secondary | ICD-10-CM

## 2015-10-16 DIAGNOSIS — Z8249 Family history of ischemic heart disease and other diseases of the circulatory system: Secondary | ICD-10-CM

## 2015-10-16 DIAGNOSIS — Z79899 Other long term (current) drug therapy: Secondary | ICD-10-CM

## 2015-10-16 DIAGNOSIS — K317 Polyp of stomach and duodenum: Secondary | ICD-10-CM | POA: Diagnosis present

## 2015-10-16 DIAGNOSIS — K219 Gastro-esophageal reflux disease without esophagitis: Secondary | ICD-10-CM | POA: Diagnosis present

## 2015-10-16 DIAGNOSIS — K2970 Gastritis, unspecified, without bleeding: Secondary | ICD-10-CM | POA: Diagnosis present

## 2015-10-16 DIAGNOSIS — E119 Type 2 diabetes mellitus without complications: Secondary | ICD-10-CM | POA: Diagnosis present

## 2015-10-16 DIAGNOSIS — K449 Diaphragmatic hernia without obstruction or gangrene: Secondary | ICD-10-CM | POA: Diagnosis present

## 2015-10-16 DIAGNOSIS — I160 Hypertensive urgency: Secondary | ICD-10-CM | POA: Diagnosis present

## 2015-10-16 DIAGNOSIS — K920 Hematemesis: Secondary | ICD-10-CM | POA: Diagnosis present

## 2015-10-16 DIAGNOSIS — Z794 Long term (current) use of insulin: Secondary | ICD-10-CM

## 2015-10-16 DIAGNOSIS — Z8673 Personal history of transient ischemic attack (TIA), and cerebral infarction without residual deficits: Secondary | ICD-10-CM | POA: Diagnosis not present

## 2015-10-16 DIAGNOSIS — G309 Alzheimer's disease, unspecified: Secondary | ICD-10-CM | POA: Diagnosis present

## 2015-10-16 DIAGNOSIS — E785 Hyperlipidemia, unspecified: Secondary | ICD-10-CM | POA: Diagnosis present

## 2015-10-16 DIAGNOSIS — F0280 Dementia in other diseases classified elsewhere without behavioral disturbance: Secondary | ICD-10-CM | POA: Diagnosis present

## 2015-10-16 DIAGNOSIS — Z7982 Long term (current) use of aspirin: Secondary | ICD-10-CM | POA: Diagnosis not present

## 2015-10-16 DIAGNOSIS — R112 Nausea with vomiting, unspecified: Secondary | ICD-10-CM | POA: Diagnosis present

## 2015-10-16 DIAGNOSIS — K922 Gastrointestinal hemorrhage, unspecified: Secondary | ICD-10-CM | POA: Diagnosis present

## 2015-10-16 DIAGNOSIS — Z833 Family history of diabetes mellitus: Secondary | ICD-10-CM | POA: Diagnosis not present

## 2015-10-16 LAB — COMPREHENSIVE METABOLIC PANEL
ALT: 18 U/L (ref 14–54)
AST: 22 U/L (ref 15–41)
Albumin: 4.3 g/dL (ref 3.5–5.0)
Alkaline Phosphatase: 124 U/L (ref 38–126)
Anion gap: 13 (ref 5–15)
BILIRUBIN TOTAL: 0.9 mg/dL (ref 0.3–1.2)
BUN: 14 mg/dL (ref 6–20)
CO2: 26 mmol/L (ref 22–32)
Calcium: 9.8 mg/dL (ref 8.9–10.3)
Chloride: 97 mmol/L — ABNORMAL LOW (ref 101–111)
Creatinine, Ser: 0.92 mg/dL (ref 0.44–1.00)
GFR, EST NON AFRICAN AMERICAN: 59 mL/min — AB (ref >60)
Glucose, Bld: 376 mg/dL — ABNORMAL HIGH (ref 65–99)
POTASSIUM: 3.7 mmol/L (ref 3.5–5.1)
Sodium: 136 mmol/L (ref 135–145)
TOTAL PROTEIN: 8.7 g/dL — AB (ref 6.5–8.1)

## 2015-10-16 LAB — CBC
HEMATOCRIT: 45.5 % (ref 35.0–47.0)
Hemoglobin: 14.9 g/dL (ref 12.0–16.0)
MCH: 27.1 pg (ref 26.0–34.0)
MCHC: 32.8 g/dL (ref 32.0–36.0)
MCV: 82.8 fL (ref 80.0–100.0)
Platelets: 242 10*3/uL (ref 150–440)
RBC: 5.5 MIL/uL — ABNORMAL HIGH (ref 3.80–5.20)
RDW: 14.4 % (ref 11.5–14.5)
WBC: 9.9 10*3/uL (ref 3.6–11.0)

## 2015-10-16 LAB — URINALYSIS COMPLETE WITH MICROSCOPIC (ARMC ONLY)
BACTERIA UA: NONE SEEN
BILIRUBIN URINE: NEGATIVE
Leukocytes, UA: NEGATIVE
NITRITE: NEGATIVE
Protein, ur: 100 mg/dL — AB
SQUAMOUS EPITHELIAL / LPF: NONE SEEN
Specific Gravity, Urine: 1.011 (ref 1.005–1.030)
pH: 8 (ref 5.0–8.0)

## 2015-10-16 LAB — GLUCOSE, CAPILLARY
GLUCOSE-CAPILLARY: 389 mg/dL — AB (ref 65–99)
Glucose-Capillary: 376 mg/dL — ABNORMAL HIGH (ref 65–99)
Glucose-Capillary: 402 mg/dL — ABNORMAL HIGH (ref 65–99)
Glucose-Capillary: 198 mg/dL — ABNORMAL HIGH (ref 65–99)

## 2015-10-16 LAB — OCCULT BLOOD GASTRIC / DUODENUM (SPECIMEN CUP): OCCULT BLOOD, GASTRIC: POSITIVE — AB

## 2015-10-16 LAB — ABO/RH: ABO/RH(D): O NEG

## 2015-10-16 LAB — TYPE AND SCREEN
ABO/RH(D): O NEG
ANTIBODY SCREEN: NEGATIVE

## 2015-10-16 LAB — HEMOGLOBIN A1C: HEMOGLOBIN A1C: 13 % — AB (ref 4.0–6.0)

## 2015-10-16 LAB — TROPONIN I: Troponin I: <0.03 ng/mL (ref <0.031)

## 2015-10-16 LAB — LIPASE, BLOOD: LIPASE: 16 U/L (ref 11–51)

## 2015-10-16 LAB — LACTIC ACID, PLASMA: Lactic Acid, Venous: 1.8 mmol/L (ref 0.5–2.0)

## 2015-10-16 LAB — MRSA PCR SCREENING: MRSA by PCR: NEGATIVE

## 2015-10-16 MED ORDER — PANTOPRAZOLE SODIUM 40 MG IV SOLR
40.0000 mg | Freq: Two times a day (BID) | INTRAVENOUS | Status: DC
  Administered 2015-10-16 – 2015-10-17 (×3): 40 mg via INTRAVENOUS
  Filled 2015-10-16 (×3): qty 40

## 2015-10-16 MED ORDER — ONDANSETRON HCL 4 MG/2ML IJ SOLN: 4.0000 mg | Freq: Four times a day (QID) | INTRAMUSCULAR | Status: DC | PRN

## 2015-10-16 MED ORDER — IOPAMIDOL (ISOVUE-300) INJECTION 61%
100.0000 mL | Freq: Once | INTRAVENOUS | Status: AC | PRN
  Administered 2015-10-16: 100 mL via INTRAVENOUS

## 2015-10-16 MED ORDER — SODIUM CHLORIDE 0.9 % IV SOLN
INTRAVENOUS | Status: DC
  Administered 2015-10-16 – 2015-10-17 (×2): via INTRAVENOUS

## 2015-10-16 MED ORDER — ONDANSETRON HCL 4 MG PO TABS: 4.0000 mg | ORAL_TABLET | Freq: Four times a day (QID) | ORAL | Status: DC | PRN

## 2015-10-16 MED ORDER — GABAPENTIN 300 MG PO CAPS
300.0000 mg | ORAL_CAPSULE | Freq: Every evening | ORAL | Status: DC
  Administered 2015-10-16: 16:00:00 300 mg via ORAL
  Filled 2015-10-16: qty 1

## 2015-10-16 MED ORDER — INSULIN GLARGINE 100 UNIT/ML ~~LOC~~ SOLN
25.0000 [IU] | Freq: Every day | SUBCUTANEOUS | Status: DC
  Filled 2015-10-16: qty 0.25

## 2015-10-16 MED ORDER — PRAVASTATIN SODIUM 20 MG PO TABS
20.0000 mg | ORAL_TABLET | Freq: Every day | ORAL | Status: DC
  Administered 2015-10-16: 16:00:00 20 mg via ORAL
  Filled 2015-10-16: qty 1

## 2015-10-16 MED ORDER — DIATRIZOATE MEGLUMINE & SODIUM 66-10 % PO SOLN
15.0000 mL | Freq: Once | ORAL | Status: AC
  Administered 2015-10-16: 15 mL via ORAL

## 2015-10-16 MED ORDER — INSULIN ASPART 100 UNIT/ML ~~LOC~~ SOLN: 0.0000 [IU] | Freq: Every day | SUBCUTANEOUS | Status: DC

## 2015-10-16 MED ORDER — HYDRALAZINE HCL 20 MG/ML IJ SOLN: 10.0000 mg | INTRAMUSCULAR | Status: DC | PRN

## 2015-10-16 MED ORDER — SODIUM CHLORIDE 0.9 % IV BOLUS (SEPSIS)
1000.0000 mL | Freq: Once | INTRAVENOUS | Status: AC
  Administered 2015-10-16: 1000 mL via INTRAVENOUS

## 2015-10-16 MED ORDER — MORPHINE SULFATE (PF) 2 MG/ML IV SOLN: 2.0000 mg | INTRAVENOUS | Status: DC | PRN

## 2015-10-16 MED ORDER — DIPHENHYDRAMINE HCL 25 MG PO CAPS: 25.0000 mg | ORAL_CAPSULE | ORAL | Status: DC | PRN

## 2015-10-16 MED ORDER — CITALOPRAM HYDROBROMIDE 10 MG PO TABS
10.0000 mg | ORAL_TABLET | Freq: Every day | ORAL | Status: DC
  Administered 2015-10-16 – 2015-10-17 (×2): 10 mg via ORAL
  Filled 2015-10-16 (×2): qty 1

## 2015-10-16 MED ORDER — METOPROLOL SUCCINATE ER 25 MG PO TB24
25.0000 mg | ORAL_TABLET | Freq: Every day | ORAL | Status: DC
Start: 2015-10-16 — End: 2015-10-17
  Administered 2015-10-16 – 2015-10-17 (×2): 25 mg via ORAL
  Filled 2015-10-16 (×2): qty 1

## 2015-10-16 MED ORDER — OXYCODONE HCL 5 MG PO TABS: 5.0000 mg | ORAL_TABLET | ORAL | Status: DC | PRN

## 2015-10-16 MED ORDER — ONDANSETRON HCL 4 MG/2ML IJ SOLN
4.0000 mg | Freq: Once | INTRAMUSCULAR | Status: AC
  Administered 2015-10-16: 4 mg via INTRAVENOUS
  Filled 2015-10-16: qty 2

## 2015-10-16 MED ORDER — TRAZODONE HCL 50 MG PO TABS
50.0000 mg | ORAL_TABLET | Freq: Every day | ORAL | Status: DC
  Administered 2015-10-16: 21:00:00 50 mg via ORAL
  Filled 2015-10-16: qty 1

## 2015-10-16 MED ORDER — INSULIN GLARGINE 100 UNIT/ML ~~LOC~~ SOLN
25.0000 [IU] | Freq: Every day | SUBCUTANEOUS | Status: DC
Start: 2015-10-16 — End: 2015-10-16
  Filled 2015-10-16: qty 0.25

## 2015-10-16 MED ORDER — LEVOTHYROXINE SODIUM 88 MCG PO TABS
88.0000 ug | ORAL_TABLET | Freq: Every day | ORAL | Status: DC
  Filled 2015-10-16: qty 1

## 2015-10-16 MED ORDER — ACETAMINOPHEN 650 MG RE SUPP: 650.0000 mg | Freq: Four times a day (QID) | RECTAL | Status: DC | PRN

## 2015-10-16 MED ORDER — VITAMIN D3 1.25 MG (50000 UT) PO CAPS: 1.0000 | ORAL_CAPSULE | ORAL | Status: DC

## 2015-10-16 MED ORDER — AMLODIPINE BESYLATE 10 MG PO TABS
10.0000 mg | ORAL_TABLET | Freq: Every day | ORAL | Status: DC
  Administered 2015-10-16 – 2015-10-17 (×2): 10 mg via ORAL
  Filled 2015-10-16 (×2): qty 1

## 2015-10-16 MED ORDER — SODIUM CHLORIDE 0.9 % IV SOLN
INTRAVENOUS | Status: DC
  Administered 2015-10-16: 16:00:00 via INTRAVENOUS

## 2015-10-16 MED ORDER — ACETAMINOPHEN 325 MG PO TABS: 650.0000 mg | ORAL_TABLET | Freq: Four times a day (QID) | ORAL | Status: DC | PRN

## 2015-10-16 MED ORDER — VITAMIN D (ERGOCALCIFEROL) 1.25 MG (50000 UNIT) PO CAPS
50000.0000 [IU] | ORAL_CAPSULE | ORAL | Status: DC
  Administered 2015-10-16: 14:00:00 50000 [IU] via ORAL
  Filled 2015-10-16: qty 1

## 2015-10-16 MED ORDER — INSULIN ASPART 100 UNIT/ML ~~LOC~~ SOLN
0.0000 [IU] | Freq: Three times a day (TID) | SUBCUTANEOUS | Status: DC
Start: 2015-10-16 — End: 2015-10-17
  Administered 2015-10-16: 9 [IU] via SUBCUTANEOUS
  Administered 2015-10-17 (×2): 3 [IU] via SUBCUTANEOUS
  Filled 2015-10-16 (×2): qty 3
  Filled 2015-10-16: qty 9

## 2015-10-16 MED ORDER — PANTOPRAZOLE SODIUM 40 MG PO TBEC
40.0000 mg | DELAYED_RELEASE_TABLET | Freq: Once | ORAL | Status: AC
Start: 2015-10-16 — End: 2015-10-16
  Administered 2015-10-16: 40 mg via ORAL
  Filled 2015-10-16: qty 1

## 2015-10-16 NOTE — H&P (Signed)
[facility] [place] [facility]   PATIENT NAME: [name] [name] T[place]    MR#:  [id number]  DATE OF BIRTH:  06-09-1941   DATE OF ADMISSION:  10/16/2015  PRIMARY CARE PHYSICIAN: Tillman Abideichard Letvak, MD   REQUESTING/REFERRING PHYSICIAN: McShane  CHIEF COMPLAINT:   Chief Complaint  Patient presents with  . Emesis    HISTORY OF PRESENT ILLNESS:  [name] [name] Kleve  is a 75 y.o. female with a known history of Alzheimer's dementia presenting with nausea and vomiting. History is somewhat limited by patient's baseline mental status. However, describes one day duration nausea vomiting not relieved with medications. Denies any abdominal pain fevers or chills. He says issues with constipation last bowel movement 2-3 days ago. While in emergency department continue to have episodes of emesis which turned coffee-ground in color.  PAST MEDICAL HISTORY:   Past Medical History  Diagnosis Date  . Diabetes mellitus   . Hypertension   . CVA (cerebral infarction)   . GERD (gastroesophageal reflux disease)   . HLD (hyperlipidemia)   . Heart murmur   . Carotid artery occlusion 02/15/2009  . Stroke (HCC)   . Dementia     PAST SURGICAL HISTORY:   Past Surgical History  Procedure Laterality Date  . Appendectomy    . Tonsillectomy      SOCIAL HISTORY:   Social History  Substance Use Topics  . Smoking status: Former Smoker    Types: Cigarettes    Quit date: 07/20/1985  . Smokeless tobacco: Never Used  . Alcohol Use: No    FAMILY HISTORY:   Family History  Problem Relation Age of Onset  . Diabetes Mother   . Hypertension Mother   . Diabetes Father   . Hypertension Father   . Diabetes Sister   . Hypertension Sister     DRUG ALLERGIES:   Allergies  Allergen Reactions  . Fexofenadine Hcl Rash    REVIEW OF SYSTEMS:  REVIEW OF SYSTEMS:  CONSTITUTIONAL: Denies fevers, chills, fatigue, weakness.  EYES: Denies blurred vision, double vision, or eye pain.   EARS, NOSE, THROAT: Denies tinnitus, ear pain, hearing loss.  RESPIRATORY: denies cough, shortness of breath, wheezing  CARDIOVASCULAR: Denies chest pain, palpitations, edema.  GASTROINTESTINAL: Positive nausea, vomiting, denies diarrhea, abdominal pain.  GENITOURINARY: Denies dysuria, hematuria.  ENDOCRINE: Denies nocturia or thyroid problems. HEMATOLOGIC AND LYMPHATIC: Denies easy bruising or bleeding.  SKIN: Denies rash or lesions.  MUSCULOSKELETAL: Denies pain in neck, back, shoulder, knees, hips, or further arthritic symptoms.  NEUROLOGIC: Denies paralysis, paresthesias.  PSYCHIATRIC: Denies anxiety or depressive symptoms. Otherwise full review of systems performed by me is negative.   MEDICATIONS AT HOME:   Prior to Admission medications   Medication Sig Start Date End Date Taking? Authorizing Provider  acetaminophen (TYLENOL) 325 MG tablet Take 2 tablets (650 mg total) by mouth every 6 (six) hours as needed for mild pain (or Fever >/= 101). 05/29/15  Yes Ramonita LabAruna Gouru, MD  amLODipine (NORVASC) 10 MG tablet Take 0.5 tablets (5 mg total) by mouth daily. Patient taking differently: Take 10 mg by mouth daily.  05/31/15  Yes Ramonita LabAruna Gouru, MD  aspirin 81 MG chewable tablet Chew 81 mg by mouth daily.   Yes Historical Provider, MD  Cholecalciferol (VITAMIN D3) 50000 units CAPS Take 1 capsule by mouth every 30 (thirty) days. GIVE ON THE 28TH OF EVERY MONTH   Yes Historical Provider, MD  citalopram (CELEXA) 10 MG tablet Take 10 mg by mouth daily.   Yes  Historical Provider, MD  furosemide (LASIX) 40 MG tablet Take 0.5 tablets (20 mg total) by mouth daily. Patient taking differently: Take 40 mg by mouth daily.  05/29/15  Yes Ramonita Lab, MD  gabapentin (NEURONTIN) 300 MG capsule Take 300 mg by mouth every evening.    Yes Historical Provider, MD  insulin glargine (LANTUS) 100 UNIT/ML injection Inject 0.27 mLs (27 Units total) into the skin at bedtime. Patient taking differently: Inject 25 Units  into the skin daily.  05/31/15  Yes Ramonita Lab, MD  levothyroxine (SYNTHROID, LEVOTHROID) 88 MCG tablet Take 88 mcg by mouth daily before breakfast.   Yes Historical Provider, MD  lovastatin (MEVACOR) 20 MG tablet Take 20 mg by mouth every evening.    Yes Historical Provider, MD  Menthol (RICOLA CHERRY HONEY HERB) 2 MG LOZG Use as directed 1 lozenge in the mouth or throat every 2 (two) hours as needed (FOR COUGH).   Yes Historical Provider, MD  metoprolol succinate (TOPROL-XL) 25 MG 24 hr tablet Take 25 mg by mouth daily.   Yes Historical Provider, MD  omeprazole (PRILOSEC) 20 MG capsule Take 20 mg by mouth 2 (two) times daily.   Yes Historical Provider, MD  ondansetron (ZOFRAN) 4 MG tablet Take 4 mg by mouth as needed for nausea or vomiting.   Yes Historical Provider, MD  potassium chloride (K-DUR) 10 MEQ tablet Take 10 mEq by mouth daily.    Yes Historical Provider, MD  promethazine (PHENERGAN) 25 MG tablet Take 12.5 mg by mouth every 8 (eight) hours as needed for nausea or vomiting. Reported on 09/05/2015   Yes Historical Provider, MD  traMADol-acetaminophen (ULTRACET) 37.5-325 MG tablet Take 1 tablet by mouth every 8 (eight) hours as needed for moderate pain or severe pain. 05/31/15  Yes Ramonita Lab, MD  traZODone (DESYREL) 50 MG tablet Take 50 mg by mouth at bedtime.   Yes Historical Provider, MD      VITAL SIGNS:  Blood pressure 144/57, pulse 99, temperature 98.5 F (36.9 C), temperature source Oral, resp. rate 21, height 4\' 11"  (1.499 m), weight 64.728 kg (142 lb 11.2 oz), SpO2 98 %.  PHYSICAL EXAMINATION:  VITAL SIGNS: Filed Vitals:   10/16/15 0854 10/16/15 0930  BP:  144/57  Pulse:  99  Temp: 98.5 F (36.9 C)   Resp:  21   GENERAL:74 y.o.female currently in no acute distress.  HEAD: Normocephalic, atraumatic.  EYES: Pupils equal, round, reactive to light. Extraocular muscles intact. No scleral icterus.  MOUTH: Moist mucosal membrane. Dentition intact. No abscess noted.  EAR,  NOSE, THROAT: Clear without exudates. No external lesions.  NECK: Supple. No thyromegaly. No nodules. No JVD.  PULMONARY: Clear to ascultation, without wheeze rails or rhonci. No use of accessory muscles, Good respiratory effort. good air entry bilaterally CHEST: Nontender to palpation.  CARDIOVASCULAR: S1 and S2. Regular rate and rhythm. No murmurs, rubs, or gallops. No edema. Pedal pulses 2+ bilaterally.  GASTROINTESTINAL: Soft, nontender, nondistended. No masses. Positive bowel sounds. No hepatosplenomegaly.  MUSCULOSKELETAL: No swelling, clubbing, or edema. Range of motion full in all extremities.  NEUROLOGIC: Cranial nerves II through XII are intact. No gross focal neurological deficits. Sensation intact. Reflexes intact.  SKIN: No ulceration, lesions, rashes, or cyanosis. Skin warm and dry. Turgor intact.  PSYCHIATRIC: Mood, affect within normal limits. The patient is awake, alert and oriented x 2. Insight, judgment poor at this time.    LABORATORY PANEL:   CBC  Recent Labs Lab 10/16/15 0245  WBC 9.9  HGB 14.9  HCT 45.5  PLT 242   ------------------------------------------------------------------------------------------------------------------  Chemistries   Recent Labs Lab 10/16/15 0245  NA 136  K 3.7  CL 97*  CO2 26  GLUCOSE 376*  BUN 14  CREATININE 0.92  CALCIUM 9.8  AST 22  ALT 18  ALKPHOS 124  BILITOT 0.9   ------------------------------------------------------------------------------------------------------------------  Cardiac Enzymes  Recent Labs Lab 10/16/15 0245  TROPONINI <0.03   ------------------------------------------------------------------------------------------------------------------  RADIOLOGY:  Ct Abdomen Pelvis W Contrast  10/16/2015  CLINICAL DATA:  Nausea and vomiting since last evening. Bilious emesis. EXAM: CT ABDOMEN AND PELVIS WITH CONTRAST TECHNIQUE: Multidetector CT imaging of the abdomen and pelvis was performed using the  standard protocol following bolus administration of intravenous contrast. CONTRAST:  ISOVUE-300 IOPAMIDOL (ISOVUE-300) INJECTION 61% COMPARISON:  Ultrasound 09/05/2015 FINDINGS: Lower chest:  Lung bases are clear.  No pleural effusions. Hepatobiliary: Normal appearance of the liver without biliary dilatation. Gallbladder is mildly distended without surrounding inflammatory changes. Pancreas: Normal appearance of the pancreas without inflammation or duct dilatation. Spleen: Normal appearance of spleen without enlargement. Adrenals/Urinary Tract: Normal adrenal glands. 8 mm low-density structure along the lateral left kidney is too small to definitively characterize. Hounsfield units measure around 40. Otherwise, normal appearance of both kidneys without hydronephrosis. Normal appearance of the bladder. Stomach/Bowel: Dilatation of the visualized distal esophagus containing oral contrast. Oral contrast in the stomach and small bowel. There is no small bowel dilatation and no evidence for bowel obstruction. Large amount of stool in the rectum. Moderate amount of stool throughout the colon. Few scattered colonic diverticula without inflammatory changes. Normal appearance of the appendix. Vascular/Lymphatic: Diffuse calcifications throughout the abdominal aorta without aneurysm. Proximal iliac arteries are also heavily calcified. No suspicious lymphadenopathy in the abdomen or pelvis. Reproductive: Normal appearance of the uterus and ovarian tissue. Other: No ascites. Negative for free air. Small umbilical hernia containing fat. Musculoskeletal:  No suspicious bone lesions identified. IMPRESSION: Dilatation of the visualized esophagus containing oral contrast. Findings could be associated with reflux or underlying esophageal dysmotility. No evidence for a bowel obstruction. There is stool throughout the colon with a large amount of stool in the rectum. These findings may represent underlying constipation.  Atherosclerotic calcifications throughout the aorta and iliac arteries without aneurysm. Indeterminate 8 mm low-density structure in the left kidney. This could represent a cyst but too small to definitively characterize. Consider following this area with a renal ultrasound. Electronically Signed   By: Richarda Overlie M.D.   On: 10/16/2015 08:27    EKG:   Orders placed or performed during the hospital encounter of 10/16/15  . EKG 12-Lead  . EKG 12-Lead    IMPRESSION AND PLAN:   75 year old African-American female history of Alzheimer's dementia who is presenting with intractable nausea and vomiting. She is from [place]  1. Intractable nausea and vomiting: IV fluid hydration, supportive care-I question this is related to constipation base of the CT scan will add MiraLAX when tolerate 2. Upper GI bleed likely in setting of gastritis secondary to multiple episodes of vomiting: PPI therapy, follow CBC, GI consult 3. Hypertensive urgency: Restart home medications add as needed hydralazine 4. Type 2 diabetes insulin requiring: We'll continue with the decreased dosage of basal Lantus, insulin sliding scale 5. Venous thrombi embolism for blood: SCDs given concern for bleeding    All the records are reviewed and case discussed with ED provider. Management plans discussed with the patient, family and they are in agreement.  CODE STATUS: Full  TOTAL  TIME TAKING CARE OF THIS PATIENT: 33 minutes.    Halbert Jesson,  Mardi Mainland.D on 10/16/2015 at 9:56 AM  Between 7am to 6pm - Pager - [phone]  After 6pm: House Pager: - [phone]  [facility]  Office  [phone]  CC: Primary care physician; Tillman Abide, MD

## 2015-10-16 NOTE — ED Notes (Signed)
Pt presents to ED via [facility] from [facility]. Per EMS pt has had N&V since supper time last evening. EMS states staff at [facility] described emesis as "green bile", EMS did not see any emesis. Staff at [facility] told EMS last emesis was 30 PTA of EMS. Pt has hx of Dementia.

## 2015-10-16 NOTE — ED Provider Notes (Signed)
-----------------------------------------   8:58 AM on 10/16/2015 -----------------------------------------  Patient here with vomiting, signed out to me at 8:30 pending CT scan. CT scan is unremarkable for actionable pathology. However, patient has had multiple episodes of vomiting here which appears somewhat coffee-ground and is gastric occult positive. Given this we will watch her in the hospital. Her abdomen is benign at this time and she is in no acute distress at this moment.  Jeanmarie PlantJames A McShane, MD 10/16/15 [phone]

## 2015-10-16 NOTE — ED Provider Notes (Signed)
[facility] Emergency Department Provider Note  ____________________________________________  Time seen: Approximately 3:50 AM  I have reviewed the triage vital signs and the nursing notes.   HISTORY  Chief Complaint Emesis  Patient with history of dementia.  HPI [name] [name] is a 75 y.o. female who comes into the hospital with vomiting. According to EMS the patient is from a nursing home and has had some nausea and vomiting since suppertime last evening. They report that the emesis is green bile. The patient reports that she has been vomiting for 3 weeks and she is unable to eat since then. The patient denies any abdominal pain and reports that she's been vomiting every day. She's had no chest pain but has some mild shortness of breath. The patient also has no diarrhea. The patient does not had any fevers. The patient was sent in for evaluation of her vomiting.   Past Medical History  Diagnosis Date  . Diabetes mellitus   . Hypertension   . CVA (cerebral infarction)   . GERD (gastroesophageal reflux disease)   . HLD (hyperlipidemia)   . Heart murmur   . Carotid artery occlusion 02/15/2009  . Stroke (HCC)   . Dementia     Patient Active Problem List   Diagnosis Date Noted  . Acute delirium 05/30/2015  . Alzheimer's disease 05/30/2015  . Altered mental status 05/24/2015  . NSTEMI (non-ST elevated myocardial infarction) (HCC) 05/24/2015  . Occlusion and stenosis of carotid artery without mention of cerebral infarction 10/08/2011  . CKD (chronic kidney disease) 09/25/2011  . Nausea and vomiting 09/22/2011  . HTN (hypertension) 09/22/2011  . H/O: CVA (cardiovascular accident) 09/22/2011  . Diabetes mellitus, type 2 (HCC) 09/22/2011    Past Surgical History  Procedure Laterality Date  . Appendectomy    . Tonsillectomy      Current Outpatient Rx  Name  Route  Sig  Dispense  Refill  . acetaminophen (TYLENOL) 325 MG tablet   Oral   Take 2  tablets (650 mg total) by mouth every 6 (six) hours as needed for mild pain (or Fever >/= 101).         Marland Kitchen. amLODipine (NORVASC) 10 MG tablet   Oral   Take 0.5 tablets (5 mg total) by mouth daily. Patient taking differently: Take 10 mg by mouth daily.    15 tablet   0   . aspirin 81 MG chewable tablet   Oral   Chew 81 mg by mouth daily.         . Cholecalciferol (VITAMIN D3) 50000 units CAPS   Oral   Take 1 capsule by mouth every 30 (thirty) days.         . citalopram (CELEXA) 10 MG tablet   Oral   Take 10 mg by mouth daily.         . furosemide (LASIX) 40 MG tablet   Oral   Take 0.5 tablets (20 mg total) by mouth daily. Patient taking differently: Take 40 mg by mouth daily.    30 tablet   0   . gabapentin (NEURONTIN) 300 MG capsule   Oral   Take 300 mg by mouth every evening.          . insulin glargine (LANTUS) 100 UNIT/ML injection   Subcutaneous   Inject 0.27 mLs (27 Units total) into the skin at bedtime. Patient taking differently: Inject 20 Units into the skin daily.    10 mL   11   .  levothyroxine (SYNTHROID, LEVOTHROID) 88 MCG tablet   Oral   Take 88 mcg by mouth daily before breakfast.         . lovastatin (MEVACOR) 20 MG tablet   Oral   Take 20 mg by mouth every evening.          . metoprolol succinate (TOPROL-XL) 25 MG 24 hr tablet   Oral   Take 25 mg by mouth daily.         Marland Kitchen omeprazole (PRILOSEC) 20 MG capsule   Oral   Take 20 mg by mouth 2 (two) times daily.         . potassium chloride (K-DUR) 10 MEQ tablet   Oral   Take 10 mEq by mouth daily.          . promethazine (PHENERGAN) 25 MG tablet   Oral   Take 12.5 mg by mouth every 8 (eight) hours as needed for nausea or vomiting. Reported on 09/05/2015         . traMADol-acetaminophen (ULTRACET) 37.5-325 MG tablet   Oral   Take 1 tablet by mouth every 8 (eight) hours as needed for moderate pain or severe pain.   30 tablet   0   . traZODone (DESYREL) 50 MG tablet    Oral   Take 50 mg by mouth at bedtime.           Allergies Fexofenadine hcl  Family History  Problem Relation Age of Onset  . Diabetes Mother   . Hypertension Mother   . Diabetes Father   . Hypertension Father   . Diabetes Sister   . Hypertension Sister     Social History Social History  Substance Use Topics  . Smoking status: Former Smoker    Types: Cigarettes    Quit date: 07/20/1985  . Smokeless tobacco: Never Used  . Alcohol Use: No    Review of Systems Constitutional: No fever/chills Eyes: No visual changes. ENT: No sore throat. Cardiovascular: Denies chest pain. Respiratory: Denies shortness of breath. Gastrointestinal: vomiting with abdominal pain,  No diarrhea.  No constipation. Genitourinary: Negative for dysuria. Musculoskeletal: Negative for back pain. Skin: Negative for rash. Neurological: Negative for headaches, focal weakness or numbness.  10-point ROS otherwise negative.  ____________________________________________   PHYSICAL EXAM:  VITAL SIGNS: ED Triage Vitals  Enc Vitals Group     BP 10/16/15 0226 194/96 mmHg     Pulse Rate 10/16/15 0226 102     Resp 10/16/15 0226 15     Temp 10/16/15 0226 98.5 F (36.9 C)     Temp Source 10/16/15 0226 Oral     SpO2 10/16/15 0223 98 %     Weight 10/16/15 0226 142 lb 11.2 oz (64.728 kg)     Height 10/16/15 0226  (1.499 m)     Head Cir --      Peak Flow --      Pain Score 10/16/15 0229 0     Pain Loc --      Pain Edu? --      Excl. in GC? --     Constitutional: Alert and oriented. Well appearing and in no acute distress. Eyes: Conjunctivae are normal. PERRL. EOMI. Head: Atraumatic. Nose: No congestion/rhinnorhea. Mouth/Throat: Mucous membranes are moist.  Oropharynx non-erythematous. Cardiovascular: Normal rate, regular rhythm. Grossly normal heart sounds.  Good peripheral circulation. Respiratory: Normal respiratory effort.  No retractions. Lungs CTAB. Gastrointestinal: Soft and  nontender. No distention. Positive bowel sounds Musculoskeletal: No lower extremity tenderness nor  edema.  Neurologic:  Normal speech and language.  Skin:  Skin is warm, dry and intact.  Psychiatric: Mood and affect are normal.   ____________________________________________   LABS (all labs ordered are listed, but only abnormal results are displayed)  Labs Reviewed  CBC - Abnormal; Notable for the following:    RBC 5.50 (*)    All other components within normal limits  COMPREHENSIVE METABOLIC PANEL - Abnormal; Notable for the following:    Chloride 97 (*)    Glucose, Bld 376 (*)    Total Protein 8.7 (*)    GFR calc non Af Amer 59 (*)    All other components within normal limits  URINALYSIS COMPLETEWITH MICROSCOPIC ([facility] ONLY) - Abnormal; Notable for the following:    Color, Urine STRAW (*)    APPearance CLEAR (*)    Glucose, UA >500 (*)    Ketones, ur 1+ (*)    Hgb urine dipstick 1+ (*)    Protein, ur 100 (*)    All other components within normal limits  LIPASE, BLOOD  TROPONIN I  LACTIC ACID, PLASMA  OCCULT BLOOD GASTRIC / DUODENUM (SPECIMEN CUP)   ____________________________________________  EKG  ED ECG REPORT I, Rebecka Apley, the attending physician, personally viewed and interpreted this ECG.   Date: 10/16/2015  EKG Time: 251  Rate: 100  Rhythm: normal sinus rhythm  Axis: normal  Intervals:none  ST&T Change: none  ____________________________________________  RADIOLOGY  CT abd and pelvis: pending ____________________________________________   PROCEDURES  Procedure(s) performed: None  Critical Care performed: No  ____________________________________________   INITIAL IMPRESSION / ASSESSMENT AND PLAN / ED COURSE  Pertinent labs & imaging results that were available during my care of the patient were reviewed by me and considered in my medical decision making (see chart for details).  This is a 75 year old female who comes into the  hospital today with vomiting. The patient has been having some bilious appearing emesis. We'll check some blood work and then send the patient for a CT scan of her abdomen and pelvis. ____________________________________________   FINAL CLINICAL IMPRESSION(S) / ED DIAGNOSES  Final diagnoses:  Non-intractable vomiting with nausea, vomiting of unspecified type      Rebecka Apley, MD 10/16/15 [phone]

## 2015-10-16 NOTE — Consult Note (Signed)
GI Inpatient Consult Note  Reason for Consult: Coffee ground emesis   Attending Requesting Consult: Dr. Clint GuyHower  History of Present Illness: [name] [name] is a 75 y.o. female with a significant pmh of Alzheimer's, DM .  [facility]  HPI is limited due to her condition, information gathered from medical records.  No family is present.  ED reports she was experiencing nausea and vomiting, described as coffee ground in appearance, tested positive for blood.  She denies any pain.  She reports she has never had a colonoscopy or EGD before, did not find any records of this either.  Past Medical History:  Past Medical History  Diagnosis Date  . Diabetes mellitus   . Hypertension   . CVA (cerebral infarction)   . GERD (gastroesophageal reflux disease)   . HLD (hyperlipidemia)   . Heart murmur   . Carotid artery occlusion 02/15/2009  . Stroke (HCC)   . Dementia     Problem List: Patient Active Problem List   Diagnosis Date Noted  . Upper GI bleed 10/16/2015  . Intractable nausea and vomiting 10/16/2015  . Hypertensive urgency 10/16/2015  . Alzheimer's disease 05/30/2015  . Altered mental status 05/24/2015  . Occlusion and stenosis of carotid artery without mention of cerebral infarction 10/08/2011  . CKD (chronic kidney disease) 09/25/2011  . H/O: CVA (cardiovascular accident) 09/22/2011  . Diabetes mellitus, type 2 (HCC) 09/22/2011    Past Surgical History: Past Surgical History  Procedure Laterality Date  . Appendectomy    . Tonsillectomy      Allergies: Allergies  Allergen Reactions  . Fexofenadine Hcl Rash    Home Medications: Prescriptions prior to admission  Medication Sig Dispense Refill Last Dose  . acetaminophen (TYLENOL) 325 MG tablet Take 2 tablets (650 mg total) by mouth every 6 (six) hours as needed for mild pain (or Fever >/= 101).   PRN  . amLODipine (NORVASC) 10 MG tablet Take 0.5 tablets (5 mg total) by mouth daily. (Patient taking  differently: Take 10 mg by mouth daily. ) 15 tablet 0 10/15/2015 at 0800  . aspirin 81 MG chewable tablet Chew 81 mg by mouth daily.   10/15/2015 at Unknown time  . Cholecalciferol (VITAMIN D3) 50000 units CAPS Take 1 capsule by mouth every 30 (thirty) days. GIVE ON THE 28TH OF EVERY MONTH   10/15/2015 at Unknown time  . citalopram (CELEXA) 10 MG tablet Take 10 mg by mouth daily.   10/15/2015 at Unknown time  . furosemide (LASIX) 40 MG tablet Take 0.5 tablets (20 mg total) by mouth daily. (Patient taking differently: Take 40 mg by mouth daily. ) 30 tablet 0 10/15/2015 at Unknown time  . gabapentin (NEURONTIN) 300 MG capsule Take 300 mg by mouth every evening.    10/15/2015 at 1700  . insulin glargine (LANTUS) 100 UNIT/ML injection Inject 0.27 mLs (27 Units total) into the skin at bedtime. (Patient taking differently: Inject 25 Units into the skin daily. ) 10 mL 11 10/15/2015 at Unknown time  . levothyroxine (SYNTHROID, LEVOTHROID) 88 MCG tablet Take 88 mcg by mouth daily before breakfast.   10/15/2015  . lovastatin (MEVACOR) 20 MG tablet Take 20 mg by mouth every evening.    10/15/2015 at 1800  . Menthol (RICOLA CHERRY HONEY HERB) 2 MG LOZG Use as directed 1 lozenge in the mouth or throat every 2 (two) hours as needed (FOR COUGH).   PRN  . metoprolol succinate (TOPROL-XL) 25 MG 24 hr tablet Take 25  mg by mouth daily.   10/15/2015 at Unknown time  . omeprazole (PRILOSEC) 20 MG capsule Take 20 mg by mouth 2 (two) times daily.   10/16/2015 at 0800  . ondansetron (ZOFRAN) 4 MG tablet Take 4 mg by mouth as needed for nausea or vomiting.   PRN  . potassium chloride (K-DUR) 10 MEQ tablet Take 10 mEq by mouth daily.    10/15/2015 at 0800  . promethazine (PHENERGAN) 25 MG tablet Take 12.5 mg by mouth every 8 (eight) hours as needed for nausea or vomiting. Reported on 09/05/2015   PRN  . traMADol-acetaminophen (ULTRACET) 37.5-325 MG tablet Take 1 tablet by mouth every 8 (eight) hours as needed for moderate pain or severe  pain. 30 tablet 0 PRN  . traZODone (DESYREL) 50 MG tablet Take 50 mg by mouth at bedtime.   10/15/2015 at 2000   Home medication reconciliation was completed with the patient.   Scheduled Inpatient Medications:   . amLODipine  10 mg Oral Daily  . citalopram  10 mg Oral Daily  . gabapentin  300 mg Oral QPM  . insulin aspart  0-5 Units Subcutaneous QHS  . insulin aspart  0-9 Units Subcutaneous TID WC  . insulin glargine  25 Units Subcutaneous Daily  . [START ON 10/17/2015] levothyroxine  88 mcg Oral QAC breakfast  . metoprolol succinate  25 mg Oral Daily  . pantoprazole (PROTONIX) IV  40 mg Intravenous Q12H  . pravastatin  20 mg Oral q1800  . traZODone  50 mg Oral QHS  . Vitamin D (Ergocalciferol)  50,000 Units Oral Q30 days    Continuous Inpatient Infusions:   . sodium chloride 75 mL/hr at 10/16/15 1345    PRN Inpatient Medications:  acetaminophen **OR** acetaminophen, diphenhydrAMINE, hydrALAZINE, morphine injection, ondansetron **OR** ondansetron (ZOFRAN) IV, oxyCODONE  Family History: family history includes Diabetes in her father, mother, and sister; Hypertension in her father, mother, and sister.    Social History:   reports that she quit smoking about 30 years ago. Her smoking use included Cigarettes. She has never used smokeless tobacco. She reports that she does not drink alcohol or use illicit drugs.   Review of Systems: (unable to assess properly due to mental status)    Physical Examination: BP 120/68 mmHg  Pulse 99  Temp(Src) 98.4 F (36.9 C) (Oral)  Resp 16  Ht  (1.499 m)  Wt 64.728 kg (142 lb 11.2 oz)  BMI 28.81 kg/m2  SpO2 100% Gen: NAD, alert and oriented x 4, pleasantly demented HEENT: PEERLA, EOMI, Neck: supple, no JVD or thyromegaly Chest: CTA bilaterally, no wheezes, crackles, or other adventitious sounds CV: RRR, no m/g/c/r Abd: soft, NT, ND, +BS in all four quadrants; no HSM, guarding, ridigity, or rebound tenderness Ext: no edema, well  perfused with 2+ pulses, Skin: no rash or lesions noted Lymph: no LAD  Data: Lab Results  Component Value Date   WBC 9.9 10/16/2015   HGB 14.9 10/16/2015   HCT 45.5 10/16/2015   MCV 82.8 10/16/2015   PLT 242 10/16/2015    Recent Labs Lab 10/16/15 0245  HGB 14.9   Lab Results  Component Value Date   NA 136 10/16/2015   K 3.7 10/16/2015   CL 97* 10/16/2015   CO2 26 10/16/2015   BUN 14 10/16/2015   CREATININE 0.92 10/16/2015   Lab Results  Component Value Date   ALT 18 10/16/2015   AST 22 10/16/2015   ALKPHOS 124 10/16/2015   BILITOT 0.9 10/16/2015  No results for input(s): APTT, INR, PTT in the last 168 hours.  Imaging: CLINICAL DATA: Nausea and vomiting since last evening. Bilious emesis.  EXAM: CT ABDOMEN AND PELVIS WITH CONTRAST  TECHNIQUE: Multidetector CT imaging of the abdomen and pelvis was performed using the standard protocol following bolus administration of intravenous contrast.  CONTRAST: ISOVUE-300 IOPAMIDOL (ISOVUE-300) INJECTION 61%  COMPARISON: Ultrasound 09/05/2015  FINDINGS: Lower chest: Lung bases are clear. No pleural effusions.  Hepatobiliary: Normal appearance of the liver without biliary dilatation. Gallbladder is mildly distended without surrounding inflammatory changes.  Pancreas: Normal appearance of the pancreas without inflammation or duct dilatation.  Spleen: Normal appearance of spleen without enlargement.  Adrenals/Urinary Tract: Normal adrenal glands. 8 mm low-density structure along the lateral left kidney is too small to definitively characterize. Hounsfield units measure around 40. Otherwise, normal appearance of both kidneys without hydronephrosis. Normal appearance of the bladder.  Stomach/Bowel: Dilatation of the visualized distal esophagus containing oral contrast. Oral contrast in the stomach and small bowel. There is no small bowel dilatation and no evidence for bowel obstruction.  Large amount of stool in the rectum. Moderate amount of stool throughout the colon. Few scattered colonic diverticula without inflammatory changes. Normal appearance of the appendix.  Vascular/Lymphatic: Diffuse calcifications throughout the abdominal aorta without aneurysm. Proximal iliac arteries are also heavily calcified. No suspicious lymphadenopathy in the abdomen or pelvis.  Reproductive: Normal appearance of the uterus and ovarian tissue.  Other: No ascites. Negative for free air. Small umbilical hernia containing fat.  Musculoskeletal: No suspicious bone lesions identified.  IMPRESSION: Dilatation of the visualized esophagus containing oral contrast. Findings could be associated with reflux or underlying esophageal dysmotility.  No evidence for a bowel obstruction. There is stool throughout the colon with a large amount of stool in the rectum. These findings may represent underlying constipation.  Atherosclerotic calcifications throughout the aorta and iliac arteries without aneurysm.  Indeterminate 8 mm low-density structure in the left kidney. This could represent a cyst but too small to definitively characterize. Consider following this area with a renal ultrasound.   Electronically Signed  By: Richarda Overlie M.D.  On: 10/16/2015 08:27  Assessment/Plan: Ms. Kubly is a 75 y.o. female with coffee ground emesis, positive for heme. Hgb on admission 14.9, MCV 82.8 .  BUN 14 / Creatinine 0.92.    Recommendations: We recommend EGD for further evaluation, will order it for tomorrow last morning/early afternoon.  We will continue to follow with you. Thank you for the consult. Please call with questions or concerns.  Carney Harder, PA-C  I personally performed these services.

## 2015-10-16 NOTE — Consult Note (Signed)
Patient does not communicate well due to Alzheimers disease.  I spoke to her daughter and got consent for a procedure EGD tomorrow.  This is to diagnose why she had coffee grounds vomiting.  Will do last case of morning or first of afternoon.  See complete note by Sung Amabileari Richards.

## 2015-10-16 NOTE — ED Notes (Signed)
Pt denies pain, diarrhea. Pt states she does have a cough. Pt alert and oriented. Pt laying in stretcher. No distress noted at this time. Emesis bag in pts lap.

## 2015-10-16 NOTE — ED Notes (Signed)
Report given to Nellie RN 

## 2015-10-16 NOTE — Consult Note (Signed)
I spoke to the patient's daughter over the phone about the EGD and the importance to its use for UGI bleed and the small risks that exist such as heart attack, bleeding from treatment versus not doing anything to diagnose the problem. She wishes us to proceed and I will call her after the procedure tomorrow if she is not present

## 2015-10-16 NOTE — ED Notes (Signed)
Pt's clothing and bedding was changed after the Pt vomited on herself.

## 2015-10-16 NOTE — ED Notes (Signed)
Pt resting on stretcher.  Denies pain, vomited small amount of coffee ground emesis.

## 2015-10-16 NOTE — ED Notes (Signed)
Pt incontinent of urine and stool. Pt vomited on self. Pt hygiene provided, gown and bedding changed.

## 2015-10-16 NOTE — ED Notes (Signed)
Spoke with pts daughter, Okey RegalCarol, [phone].  Updated about pts admission, states she will come soon.

## 2015-10-17 ENCOUNTER — Encounter
Admission: EM | Disposition: A | Discharge status: Nursing Home (Includes ICF) | Payer: Self-pay | Source: Home / Self Care | Attending: Internal Medicine

## 2015-10-17 ENCOUNTER — Encounter: Payer: Self-pay | Admitting: *Deleted

## 2015-10-17 ENCOUNTER — Inpatient Hospital Stay: Payer: Medicare Other | Admitting: Anesthesiology

## 2015-10-17 HISTORY — PX: ESOPHAGOGASTRODUODENOSCOPY: SHX5428

## 2015-10-17 LAB — GLUCOSE, CAPILLARY
GLUCOSE-CAPILLARY: 230 mg/dL — AB (ref 65–99)
GLUCOSE-CAPILLARY: 246 mg/dL — AB (ref 65–99)
Glucose-Capillary: 217 mg/dL — ABNORMAL HIGH (ref 65–99)

## 2015-10-17 LAB — BASIC METABOLIC PANEL
Anion gap: 6 (ref 5–15)
BUN: 29 mg/dL — ABNORMAL HIGH (ref 6–20)
CALCIUM: 8.7 mg/dL — AB (ref 8.9–10.3)
CO2: 26 mmol/L (ref 22–32)
CREATININE: 1.74 mg/dL — AB (ref 0.44–1.00)
Chloride: 107 mmol/L (ref 101–111)
GFR calc Af Amer: 32 mL/min — ABNORMAL LOW (ref >60)
GFR calc non Af Amer: 27 mL/min — ABNORMAL LOW (ref >60)
GLUCOSE: 225 mg/dL — AB (ref 65–99)
Potassium: 3.3 mmol/L — ABNORMAL LOW (ref 3.5–5.1)
Sodium: 139 mmol/L (ref 135–145)

## 2015-10-17 LAB — CBC
HCT: 36.8 % (ref 35.0–47.0)
Hemoglobin: 12.1 g/dL (ref 12.0–16.0)
MCH: 27.1 pg (ref 26.0–34.0)
MCHC: 32.7 g/dL (ref 32.0–36.0)
MCV: 82.7 fL (ref 80.0–100.0)
PLATELETS: 218 10*3/uL (ref 150–440)
RBC: 4.45 MIL/uL (ref 3.80–5.20)
RDW: 14.4 % (ref 11.5–14.5)
WBC: 9.4 10*3/uL (ref 3.6–11.0)

## 2015-10-17 SURGERY — EGD (ESOPHAGOGASTRODUODENOSCOPY)
Anesthesia: General | Laterality: Left

## 2015-10-17 SURGERY — EGD (ESOPHAGOGASTRODUODENOSCOPY)
Anesthesia: General

## 2015-10-17 MED ORDER — MIDAZOLAM HCL 2 MG/2ML IJ SOLN
INTRAMUSCULAR | Status: DC | PRN
Start: 2015-10-17 — End: 2015-10-17
  Administered 2015-10-17: 1 mg via INTRAVENOUS

## 2015-10-17 MED ORDER — GLYCOPYRROLATE 0.2 MG/ML IJ SOLN
INTRAMUSCULAR | Status: DC | PRN
  Administered 2015-10-17: 0.1 mg via INTRAVENOUS

## 2015-10-17 MED ORDER — FENTANYL CITRATE (PF) 100 MCG/2ML IJ SOLN
INTRAMUSCULAR | Status: DC | PRN
  Administered 2015-10-17: 50 ug via INTRAVENOUS

## 2015-10-17 MED ORDER — LIDOCAINE HCL (CARDIAC) 20 MG/ML IV SOLN
INTRAVENOUS | Status: DC | PRN
  Administered 2015-10-17: 20 mg via INTRAVENOUS

## 2015-10-17 MED ORDER — PROPOFOL 500 MG/50ML IV EMUL
INTRAVENOUS | Status: DC | PRN
  Administered 2015-10-17: 75 ug/kg/min via INTRAVENOUS

## 2015-10-17 NOTE — Progress Notes (Signed)
Pt d/c to [facility] via EMS by stretcher per orders.

## 2015-10-17 NOTE — Transfer of Care (Signed)
Immediate Anesthesia Transfer of Care Note  Patient: [name] [name]  Procedure(s) Performed: Procedure(s): ESOPHAGOGASTRODUODENOSCOPY (EGD) (Left)  Patient Location: PACU  Anesthesia Type:General  Level of Consciousness: awake and sedated  Airway & Oxygen Therapy: Patient Spontanous Breathing and Patient connected to nasal cannula oxygen  Post-op Assessment: Report given to RN and Post -op Vital signs reviewed and stable  Post vital signs: Reviewed and stable  Last Vitals:  Filed Vitals:   10/17/15 0508 10/17/15 1035  BP: 141/70 159/83  Pulse: 81 95  Temp: 37.1 C 36.3 C  Resp: 18 16    Complications: No apparent anesthesia complications

## 2015-10-17 NOTE — Anesthesia Procedure Notes (Signed)
Performed by: COOK-MARTIN, Jena Tegeler Pre-anesthesia Checklist: Patient identified, Emergency Drugs available, Suction available, Patient being monitored and Timeout performed Patient Re-evaluated:Patient Re-evaluated prior to inductionOxygen Delivery Method: Nasal cannula Preoxygenation: Pre-oxygenation with 100% oxygen Intubation Type: IV induction Airway Equipment and Method: Bite block Placement Confirmation: positive ETCO2 and CO2 detector     

## 2015-10-17 NOTE — Care Management (Signed)
Admitted to [facility] with the diagnosis of upper GI Bleed. A resident of [facility] since 05/31/2015. Daughter is Anders SimmondsCarol Holmes [phone]). Last seen Dr. Alphonsus SiasLetvak 10/07/15. NPO Scheduled for EGD today. Hgb 12.1 Gwenette GreetBrenda S Janice Seales RN MSN CCM Care Management [phone]

## 2015-10-17 NOTE — Anesthesia Preprocedure Evaluation (Signed)
Anesthesia Evaluation  Patient identified by MRN, date of birth, ID band Patient confused    Reviewed: Allergy & Precautions, NPO status , Patient's Chart, lab work & pertinent test results  History of Anesthesia Complications Negative for: history of anesthetic complications  Airway Mallampati: III       Dental   Pulmonary neg pulmonary ROS, former smoker,           Cardiovascular hypertension, Pt. on medications and Pt. on home beta blockers + Peripheral Vascular Disease  + Valvular Problems/Murmurs (murmur)      Neuro/Psych Depression Dementia  CVA (r sided weakness), Residual Symptoms negative neurological ROS     GI/Hepatic GERD  Medicated,  Endo/Other  diabetes, Type 2, Oral Hypoglycemic Agents, Insulin DependentHypothyroidism   Renal/GU Renal InsufficiencyRenal disease     Musculoskeletal   Abdominal   Peds  Hematology   Anesthesia Other Findings   Reproductive/Obstetrics                             Anesthesia Physical Anesthesia Plan  ASA: III  Anesthesia Plan: General   Post-op Pain Management:    Induction: Intravenous  Airway Management Planned: Nasal Cannula  Additional Equipment:   Intra-op Plan:   Post-operative Plan:   Informed Consent: I have reviewed the patients History and Physical, chart, labs and discussed the procedure including the risks, benefits and alternatives for the proposed anesthesia with the patient or authorized representative who has indicated his/her understanding and acceptance.     Plan Discussed with:   Anesthesia Plan Comments:         Anesthesia Quick Evaluation

## 2015-10-17 NOTE — Progress Notes (Signed)
[facility] per [facility], EMS called for transport for [facility].

## 2015-10-17 NOTE — [place] FL2 (Signed)
[place] MEDICAID FL2 LEVEL OF CARE SCREENING TOOL     IDENTIFICATION  Patient Name: [name] [name] Birthdate: 09/29/1940 Sex: female Admission Date (Current Location): 10/16/2015  [place]   Facility and Address:  [facility], [address], [place], [place] [address]      Provider Number: [id number]  Attending Physician Name and Address:  Wyatt Hasteavid K Hower, MD  Relative Name and Phone Number:       Current Level of Care: Hospital Recommended Level of Care: Skilled Nursing Facility Prior Approval Number:    Date Approved/Denied:   PASRR Number: [id number]  Discharge Plan: SNF    Current Diagnoses: Patient Active Problem List   Diagnosis Date Noted  . Upper GI bleed 10/16/2015  . Intractable nausea and vomiting 10/16/2015  . Hypertensive urgency 10/16/2015  . Alzheimer's disease 05/30/2015  . Altered mental status 05/24/2015  . Occlusion and stenosis of carotid artery without mention of cerebral infarction 10/08/2011  . CKD (chronic kidney disease) 09/25/2011  . H/O: CVA (cardiovascular accident) 09/22/2011  . Diabetes mellitus, type 2 (HCC) 09/22/2011    Orientation RESPIRATION BLADDER Height & Weight     Self, Place  Normal Incontinent Weight: 131 lb (59.421 kg) Height:  4\' 4"  (132.1 cm)  BEHAVIORAL SYMPTOMS/MOOD NEUROLOGICAL BOWEL NUTRITION STATUS      Incontinent Diet (Soft Diet)  AMBULATORY STATUS COMMUNICATION OF NEEDS Skin   Extensive Assist Verbally Normal                       Personal Care Assistance Level of Assistance  Bathing, Feeding, Dressing Bathing Assistance: Maximum assistance Feeding assistance: Limited assistance Dressing Assistance: Maximum assistance     Functional Limitations Info  Sight, Hearing, Speech Sight Info: Adequate Hearing Info: Adequate Speech Info: Adequate    SPECIAL CARE FACTORS FREQUENCY  PT (By licensed PT)                    Contractures       Additional Factors Info  Code Status, Allergies Code Status Info: Full Code Allergies Info: Fexofenadine Hcl           Current Medications (10/17/2015):  This is the current hospital active medication list Current Facility-Administered Medications  Medication Dose Route Frequency Provider Last Rate Last Dose  . 0.9 %  sodium chloride infusion   Intravenous Continuous Wyatt Hasteavid K Hower, MD 75 mL/hr at 10/17/15 0236    . acetaminophen (TYLENOL) tablet 650 mg  650 mg Oral Q6H PRN Wyatt Hasteavid K Hower, MD       Or  . acetaminophen (TYLENOL) suppository 650 mg  650 mg Rectal Q6H PRN Wyatt Hasteavid K Hower, MD      . amLODipine (NORVASC) tablet 10 mg  10 mg Oral Daily Wyatt Hasteavid K Hower, MD   10 mg at 10/17/15 1253  . citalopram (CELEXA) tablet 10 mg  10 mg Oral Daily Wyatt Hasteavid K Hower, MD   10 mg at 10/17/15 1253  . diphenhydrAMINE (BENADRYL) capsule 25 mg  25 mg Oral Q4H PRN Wyatt Hasteavid K Hower, MD      . gabapentin (NEURONTIN) capsule 300 mg  300 mg Oral QPM Wyatt Hasteavid K Hower, MD   300 mg at 10/16/15 1623  . hydrALAZINE (APRESOLINE) injection 10 mg  10 mg Intravenous Q4H PRN Wyatt Hasteavid K Hower, MD      . insulin aspart (novoLOG) injection 0-5 Units  0-5 Units Subcutaneous QHS Wyatt Hasteavid K Hower, MD  0 Units at 10/16/15 2134  . insulin aspart (novoLOG) injection 0-9 Units  0-9 Units Subcutaneous TID WC Wyatt Haste, MD   3 Units at 10/17/15 [phone]  . insulin glargine (LANTUS) injection 25 Units  25 Units Subcutaneous Daily Wyatt Haste, MD      . levothyroxine (SYNTHROID, LEVOTHROID) tablet 88 mcg  88 mcg Oral QAC breakfast Wyatt Haste, MD   88 mcg at 10/17/15 0601  . metoprolol succinate (TOPROL-XL) 24 hr tablet 25 mg  25 mg Oral Daily Wyatt Haste, MD   25 mg at 10/17/15 1253  . morphine 2 MG/ML injection 2 mg  2 mg Intravenous Q4H PRN Wyatt Haste, MD      . ondansetron [facility]) tablet 4 mg  4 mg Oral Q6H PRN Wyatt Haste, MD       Or  . ondansetron [facility]) injection 4 mg  4 mg Intravenous Q6H PRN Wyatt Haste, MD      .  oxyCODONE (Oxy IR/ROXICODONE) immediate release tablet 5 mg  5 mg Oral Q4H PRN Wyatt Haste, MD      . pantoprazole (PROTONIX) injection 40 mg  40 mg Intravenous Q12H Wyatt Haste, MD   40 mg at 10/17/15 0758  . pravastatin (PRAVACHOL) tablet 20 mg  20 mg Oral q1800 Wyatt Haste, MD   20 mg at 10/16/15 1623  . traZODone (DESYREL) tablet 50 mg  50 mg Oral QHS Wyatt Haste, MD   50 mg at 10/16/15 2108  . Vitamin D (Ergocalciferol) (DRISDOL) capsule 50,000 Units  50,000 Units Oral Q30 days Wyatt Haste, MD   50,000 Units at 10/16/15 1345     Discharge Medications: Please see discharge summary for a list of discharge medications.  Relevant Imaging Results:  Relevant Lab Results:   Additional Information SSN:  [id number]  Dede Query, LCSW

## 2015-10-17 NOTE — OR Nursing (Signed)
Dr. Mechele CollinElliott spoke at length to patient's daughter regarding the necessity of the scheduled procedure of [name] [name].  Daughter verbalized understanding and gave Dr. Mechele CollinElliott telephone permission for procedure.  Patient is unable to sign her own consents.

## 2015-10-17 NOTE — Consult Note (Signed)
Patient EGD showed no blood, no ulcer, mild gastritis, multiple gastric harmless polyps, hyperplastic nodule in duodenum.  Can go on soft food and return to her preadmission facility.

## 2015-10-17 NOTE — Op Note (Signed)
[facility] Gastroenterology Patient Name: [name] [name] Procedure Date: 10/17/2015 11:23 AM MRN: [id number] Account #: [id number] Date of Birth: 1941/05/21 Admit Type: Inpatient Age: 75 Room: [facility] ENDO ROOM 4 Gender: Female Note Status: Finalized Procedure:            Upper GI endoscopy Indications:          Coffee-ground emesis Providers:            Scot Jun, MD Referring MD:         Karie Schwalbe, MD (Referring MD) Medicines:            Propofol per Anesthesia Complications:        No immediate complications. Procedure:            Pre-Anesthesia Assessment:                       - After reviewing the risks and benefits, the patient                        was deemed in satisfactory condition to undergo the                        procedure.                       After obtaining informed consent, the endoscope was                        passed under direct vision. Throughout the procedure,                        the patient's blood pressure, pulse, and oxygen                        saturations were monitored continuously. The Endoscope                        was introduced through the mouth, and advanced to the                        second part of duodenum. The upper GI endoscopy was                        accomplished without difficulty. The patient tolerated                        the procedure well. Findings:      The examined esophagus was normal.      A small hiatal hernia was present.      Diffuse mild inflammation characterized by erythema and granularity was       found in the gastric body.      Multiple small sessile polyps with no bleeding and no stigmata of recent       bleeding were found in the gastric body.      A few medium mucosal nodules were found in the duodenal bulb.      The second portion of the duodenum was normal. Impression:           - Normal esophagus.                       -  Small hiatal hernia.   - Gastritis.                       - Multiple gastric polyps.                       - Mucosal nodule found in the duodenum.                       - Normal second portion of the duodenum.                       - No specimens collected. Recommendation:       - The findings and recommendations were discussed with                        the patient's family. May resume diet, discharge back                        to facility. Scot Junobert T Elliott, MD 10/17/2015 11:38:30 AM This report has been signed electronically. Number of Addenda: 0 Note Initiated On: 10/17/2015 11:23 AM      [facility]

## 2015-10-17 NOTE — Care Management Important Message (Signed)
Important Message  Patient Details  Name: Daphene CalamitySophie A Hiser MRN: [id number] Date of Birth: 1940-09-22   Medicare Important Message Given:  Yes    Gwenette GreetBrenda S Avielle Imbert, RN 10/17/2015, 8:14 AM

## 2015-10-17 NOTE — Progress Notes (Signed)
Inpatient Diabetes Program Recommendations  AACE/ADA: New Consensus Statement on Inpatient Glycemic Control (2015)  Target Ranges:  Prepandial:   less than 140 mg/dL      Peak postprandial:   less than 180 mg/dL (1-2 hours)      Critically ill patients:  140 - 180 mg/dL   Review of Glycemic Control  Results for [name] CalamityDREWERY, Cornelious A (MRN [id number]) as of 10/17/2015 08:51  Ref. Range 10/16/2015 13:56 10/16/2015 16:09 10/16/2015 16:11 10/16/2015 21:06 10/17/2015 07:29  Glucose-Capillary Latest Ref Range: 65-99 mg/dL 366389 (H) 440402 (H) 347376 (H) 198 (H) 230 (H)    Diabetes history: Type 2 Outpatient Diabetes medications: Lantus 25 units qhs Current orders for Inpatient glycemic control: Lantus 25 units qhs, Novolog 0-9 units tid, Novolog 0-5 units qhs  Inpatient Diabetes Program Recommendations: Agree with current orders for insulin.  Lantus 25 units was not given yesterday at hs resulting in elevated blood sugars today.  Consider giving the Lantus now and then start qhs tomorrow.   A1C 13% on 10/16/15  Susette RacerJulie Shanard Treto, RN, BA, MHA, CDE Diabetes Coordinator Inpatient Diabetes Program  [phone] (Team Pager) [phone] [facility] Office) 10/17/2015 8:54 AM

## 2015-10-17 NOTE — Clinical Social Work Note (Signed)
Clinical Social Work Assessment  Patient Details  Name: [name] [name] MRN: [id number] Date of Birth: [birth year]  Date of referral:  10/17/15               Reason for consult:  Facility Placement                Permission sought to share information with:  Family Supports Permission granted to share information::  Yes, Verbal Permission Granted  Name::     [name] [name], daughter    Housing/Transportation Living arrangements for the past 2 months:  [place] of Information:  Patient, Adult Children, Power of Attorney Patient Interpreter Needed:  None Criminal Activity/Legal Involvement Pertinent to Current Situation/Hospitalization:  No - Comment as needed Significant Relationships:  Adult Children Lives with:  Facility Resident Do you feel safe going back to the place where you live?  Yes Need for family participation in patient care:  Yes (Comment)  Care giving concerns:  No care giving concerns identified.   Social Worker assessment / plan:  CSW met with pt to address consult as pt was admitted from [facility]. CSW introduced herself and explained role of social work. CSW also explained process of discharging and returning to facility as pt is ready for discharge today.  CSW spoke with facility and pt is able to return at discharge. CSW spoke with pt's daughter regarding discharge, and she is in agreement about pt's return. CSW sent discharge information to facility and facility is able to accept pt. RN called report and EMS provided transportation. CSW is signing off as no further needs identified.   Employment status:  Retired Forensic scientist:  Information systems manager, Medicaid In [place] PT Recommendations:  Not assessed at this time [place] / Referral to community resources:  Other (Comment Required) [facility])  Patient/Family's Response to care:  Pt and daughter were appreciative of CSW support.   Patient/Family's Understanding of and  Emotional Response to Diagnosis, Current Treatment, and Prognosis:  Pt's daughter understands that pt will return to [facility] for LTC.   Emotional Assessment Appearance:  Appears stated age Attitude/Demeanor/Rapport:  Other (Appropriate) Affect (typically observed):  Accepting, Pleasant Orientation:  Oriented to Self, Oriented to Place Alcohol / Substance use:  Never Used Psych involvement (Current and /or in the community):  No (Comment)  Discharge Needs  Concerns to be addressed:  Other (Comment Required (Return to facility ) Readmission within the last 30 days:  No Current discharge risk:  Chronically ill Barriers to Discharge:  No Barriers Identified   Darden Dates, LCSW 10/17/2015, 5:02 PM

## 2015-10-17 NOTE — Discharge Summary (Addendum)
[facility] [place] [facility]   PATIENT NAME: [name] [name]    MR#:  [id number]  DATE OF BIRTH:  06-25-1941  DATE OF ADMISSION:  10/16/2015 ADMITTING PHYSICIAN: Wyatt Haste, MD  DATE OF DISCHARGE: 10/17/2015 PRIMARY CARE PHYSICIAN: Tillman Abide, MD    ADMISSION DIAGNOSIS:  Hematemesis with nausea [K92.0, R11.0] Non-intractable vomiting with nausea, vomiting of unspecified type [R11.2]  DISCHARGE DIAGNOSIS:  Principal Problem:   Upper GI bleed Active Problems:   Intractable nausea and vomiting   Hypertensive urgency   SECONDARY DIAGNOSIS:   Past Medical History  Diagnosis Date  . Diabetes mellitus   . Hypertension   . CVA (cerebral infarction)   . GERD (gastroesophageal reflux disease)   . HLD (hyperlipidemia)   . Heart murmur   . Carotid artery occlusion 02/15/2009  . Stroke (HCC)   . Dementia     HOSPITAL COURSE:  Murial Beam  is a 75 y.o. female admitted 10/16/2015 with chief complaint nausea and vomiting as above with associated coffee-ground emesis. Please see H&P performed by myself for further information. On arrival to the hospital patient's history was limited by her dementia regardless, symptoms above. Her nauseous and vomiting have significantly improved. She has been evaluated by gastroenterology and underwent endoscopy without any acute findings. She was improved and stable for discharge  DISCHARGE CONDITIONS:   Stable/improved  CONSULTS OBTAINED:  Treatment Team:  Scot Jun, MD  DRUG ALLERGIES:   Allergies  Allergen Reactions  . Fexofenadine Hcl Rash    DISCHARGE MEDICATIONS:   Current Discharge Medication List    CONTINUE these medications which have NOT CHANGED   Details  acetaminophen (TYLENOL) 325 MG tablet Take 2 tablets (650 mg total) by mouth every 6 (six) hours as needed for mild pain (or Fever >/= 101).    amLODipine (NORVASC) 10 MG tablet Take 0.5 tablets (5 mg total) by mouth  daily. Qty: 15 tablet, Refills: 0    aspirin 81 MG chewable tablet Chew 81 mg by mouth daily.    Cholecalciferol (VITAMIN D3) 50000 units CAPS Take 1 capsule by mouth every 30 (thirty) days. GIVE ON THE 28TH OF EVERY MONTH    citalopram (CELEXA) 10 MG tablet Take 10 mg by mouth daily.    furosemide (LASIX) 40 MG tablet Take 0.5 tablets (20 mg total) by mouth daily. Qty: 30 tablet, Refills: 0    gabapentin (NEURONTIN) 300 MG capsule Take 300 mg by mouth every evening.     insulin glargine (LANTUS) 100 UNIT/ML injection Inject 0.27 mLs (27 Units total) into the skin at bedtime. Qty: 10 mL, Refills: 11    levothyroxine (SYNTHROID, LEVOTHROID) 88 MCG tablet Take 88 mcg by mouth daily before breakfast.    lovastatin (MEVACOR) 20 MG tablet Take 20 mg by mouth every evening.     Menthol (RICOLA CHERRY HONEY HERB) 2 MG LOZG Use as directed 1 lozenge in the mouth or throat every 2 (two) hours as needed (FOR COUGH).    metoprolol succinate (TOPROL-XL) 25 MG 24 hr tablet Take 25 mg by mouth daily.    omeprazole (PRILOSEC) 20 MG capsule Take 20 mg by mouth 2 (two) times daily.    ondansetron (ZOFRAN) 4 MG tablet Take 4 mg by mouth as needed for nausea or vomiting.    potassium chloride (K-DUR) 10 MEQ tablet Take 10 mEq by mouth daily.     promethazine (PHENERGAN) 25 MG tablet Take 12.5 mg by mouth every 8 (eight)  hours as needed for nausea or vomiting. Reported on 09/05/2015    traMADol-acetaminophen (ULTRACET) 37.5-325 MG tablet Take 1 tablet by mouth every 8 (eight) hours as needed for moderate pain or severe pain. Qty: 30 tablet, Refills: 0    traZODone (DESYREL) 50 MG tablet Take 50 mg by mouth at bedtime.         DISCHARGE INSTRUCTIONS:    DIET:  Diabetic diet  DISCHARGE CONDITION:  Stable  ACTIVITY:  Activity as tolerated  OXYGEN:  Home Oxygen: No.   Oxygen Delivery: room air  DISCHARGE LOCATION:  home   If you experience worsening of your admission symptoms,  develop shortness of breath, life threatening emergency, suicidal or homicidal thoughts you must seek medical attention immediately by calling 911 or calling your MD immediately  if symptoms less severe.  You Must read complete instructions/literature along with all the possible adverse reactions/side effects for all the Medicines you take and that have been prescribed to you. Take any new Medicines after you have completely understood and accpet all the possible adverse reactions/side effects.   Please note  You were cared for by a hospitalist during your hospital stay. If you have any questions about your discharge medications or the care you received while you were in the hospital after you are discharged, you can call the unit and asked to speak with the hospitalist on call if the hospitalist that took care of you is not available. Once you are discharged, your primary care physician will handle any further medical issues. Please note that NO REFILLS for any discharge medications will be authorized once you are discharged, as it is imperative that you return to your primary care physician (or establish a relationship with a primary care physician if you do not have one) for your aftercare needs so that they can reassess your need for medications and monitor your lab values.    On the day of Discharge:   VITAL SIGNS:  Blood pressure 173/78, pulse 102, temperature 98.3 F (36.8 C), temperature source Oral, resp. rate 20, height 4\' 4"  (1.321 m), weight 59.421 kg (131 lb), SpO2 100 %.  I/O:   Intake/Output Summary (Last 24 hours) at 10/17/15 1318 Last data filed at 10/17/15 1130  Gross per 24 hour  Intake    100 ml  Output      0 ml  Net    100 ml    PHYSICAL EXAMINATION:  GENERAL:  75 y.o.-year-old patient lying in the bed with no acute distress.  EYES: Pupils equal, round, reactive to light and accommodation. No scleral icterus. Extraocular muscles intact.  HEENT: Head atraumatic,  normocephalic. Oropharynx and nasopharynx clear.  NECK:  Supple, no jugular venous distention. No thyroid enlargement, no tenderness.  LUNGS: Normal breath sounds bilaterally, no wheezing, rales,rhonchi or crepitation. No use of accessory muscles of respiration.  CARDIOVASCULAR: S1, S2 normal. No murmurs, rubs, or gallops.  ABDOMEN: Soft, non-tender, non-distended. Bowel sounds present. No organomegaly or mass.  EXTREMITIES: No pedal edema, cyanosis, or clubbing.  NEUROLOGIC: Cranial nerves II through XII are intact. Muscle strength 5/5 in all extremities. Sensation intact. Gait not checked.  PSYCHIATRIC: The patient is alert and oriented x 3.  SKIN: No obvious rash, lesion, or ulcer.   DATA REVIEW:   CBC  Recent Labs Lab 10/17/15 0451  WBC 9.4  HGB 12.1  HCT 36.8  PLT 218    Chemistries   Recent Labs Lab 10/16/15 0245 10/17/15 0451  NA 136 139  K 3.7 3.3*  CL 97* 107  CO2 26 26  GLUCOSE 376* 225*  BUN 14 29*  CREATININE 0.92 1.74*  CALCIUM 9.8 8.7*  AST 22  --   ALT 18  --   ALKPHOS 124  --   BILITOT 0.9  --     Cardiac Enzymes  Recent Labs Lab 10/16/15 0245  TROPONINI <0.03    Microbiology Results  Results for orders placed or performed during the hospital encounter of 10/16/15  MRSA PCR Screening     Status: None   Collection Time: 10/16/15  3:39 PM  Result Value Ref Range Status   MRSA by PCR NEGATIVE NEGATIVE Final    Comment:        The GeneXpert MRSA Assay (FDA approved for NASAL specimens only), is one component of a comprehensive MRSA colonization surveillance program. It is not intended to diagnose MRSA infection nor to guide or monitor treatment for MRSA infections.     RADIOLOGY:  Ct Abdomen Pelvis W Contrast  10/16/2015  CLINICAL DATA:  Nausea and vomiting since last evening. Bilious emesis. EXAM: CT ABDOMEN AND PELVIS WITH CONTRAST TECHNIQUE: Multidetector CT imaging of the abdomen and pelvis was performed using the standard  protocol following bolus administration of intravenous contrast. CONTRAST:  ISOVUE-300 IOPAMIDOL (ISOVUE-300) INJECTION 61% COMPARISON:  Ultrasound 09/05/2015 FINDINGS: Lower chest:  Lung bases are clear.  No pleural effusions. Hepatobiliary: Normal appearance of the liver without biliary dilatation. Gallbladder is mildly distended without surrounding inflammatory changes. Pancreas: Normal appearance of the pancreas without inflammation or duct dilatation. Spleen: Normal appearance of spleen without enlargement. Adrenals/Urinary Tract: Normal adrenal glands. 8 mm low-density structure along the lateral left kidney is too small to definitively characterize. Hounsfield units measure around 40. Otherwise, normal appearance of both kidneys without hydronephrosis. Normal appearance of the bladder. Stomach/Bowel: Dilatation of the visualized distal esophagus containing oral contrast. Oral contrast in the stomach and small bowel. There is no small bowel dilatation and no evidence for bowel obstruction. Large amount of stool in the rectum. Moderate amount of stool throughout the colon. Few scattered colonic diverticula without inflammatory changes. Normal appearance of the appendix. Vascular/Lymphatic: Diffuse calcifications throughout the abdominal aorta without aneurysm. Proximal iliac arteries are also heavily calcified. No suspicious lymphadenopathy in the abdomen or pelvis. Reproductive: Normal appearance of the uterus and ovarian tissue. Other: No ascites. Negative for free air. Small umbilical hernia containing fat. Musculoskeletal:  No suspicious bone lesions identified. IMPRESSION: Dilatation of the visualized esophagus containing oral contrast. Findings could be associated with reflux or underlying esophageal dysmotility. No evidence for a bowel obstruction. There is stool throughout the colon with a large amount of stool in the rectum. These findings may represent underlying constipation. Atherosclerotic  calcifications throughout the aorta and iliac arteries without aneurysm. Indeterminate 8 mm low-density structure in the left kidney. This could represent a cyst but too small to definitively characterize. Consider following this area with a renal ultrasound. Electronically Signed   By: Richarda Overlie M.D.   On: 10/16/2015 08:27     Management plans discussed with the patient, family and they are in agreement.  CODE STATUS:     Code Status Orders        Start     Ordered   10/16/15 0916  Full code   Continuous     10/16/15 0916    Code Status History    Date Active Date Inactive Code Status Order ID Comments User Context   05/24/2015 11:09  AM 05/31/2015  6:33 PM Full Code [id number]  Gale Journey, MD Inpatient   09/23/2011 12:31 AM 09/28/2011  7:33 PM Full Code [id number]  Kimber Relic, RN Inpatient      TOTAL TIME TAKING CARE OF THIS PATIENT: 28 minutes.    Teddie Mehta,  Mardi [place].D on 10/17/2015 at 1:18 PM  Between 7am to 6pm - Pager - [phone]  After 6pm go to [email or web address] - password [email or web address] [facility]  [facility]  Office  [phone]  CC: Primary care physician; Tillman Abide, MD

## 2015-10-17 NOTE — Consult Note (Signed)
I spoke with her daughter on the phone last nite about the plan for an EGD to look into her mother's stomach and see what is causing her bleeding and hematemesis.  I discussed rare complications such as sudden heart attack, bleeding, told her she would be attended to by a CRNA who will be responsible for her anesthesia.  I told her I have done over 25,000 of these procedures in my 30 years of experience

## 2015-10-17 NOTE — OR Nursing (Signed)
Previous note was not written by Dr. Lynnae Prudeobert Elliott, but by Jonathon ResidesBobbie Arron Mcnaught, RN.  All information is correct as I signed in on his page in EPIC.

## 2015-10-17 NOTE — Anesthesia Postprocedure Evaluation (Signed)
Anesthesia Post Note  Patient: [name] [name]  Procedure(s) Performed: Procedure(s) (LRB): ESOPHAGOGASTRODUODENOSCOPY (EGD) (Left)  Patient location during evaluation: Endoscopy Anesthesia Type: General Level of consciousness: awake and alert Pain management: pain level controlled Vital Signs Assessment: post-procedure vital signs reviewed and stable Respiratory status: spontaneous breathing and respiratory function stable Cardiovascular status: stable Anesthetic complications: no    Last Vitals:  Filed Vitals:   10/17/15 1210 10/17/15 1235  BP: 113/58 154/107  Pulse: 89 98  Temp:  36.8 C  Resp: 21     Last Pain:  Filed Vitals:   10/17/15 1236  PainSc: 0-No pain                 Bracen Schum K

## 2015-10-18 ENCOUNTER — Encounter: Payer: Self-pay | Admitting: Unknown Physician Specialty

## 2015-10-18 DIAGNOSIS — K2970 Gastritis, unspecified, without bleeding: Secondary | ICD-10-CM | POA: Diagnosis not present

## 2015-10-18 DIAGNOSIS — K5900 Constipation, unspecified: Secondary | ICD-10-CM | POA: Diagnosis not present

## 2015-11-13 DIAGNOSIS — I69398 Other sequelae of cerebral infarction: Secondary | ICD-10-CM | POA: Diagnosis not present

## 2015-11-13 DIAGNOSIS — E785 Hyperlipidemia, unspecified: Secondary | ICD-10-CM

## 2015-11-13 DIAGNOSIS — E039 Hypothyroidism, unspecified: Secondary | ICD-10-CM

## 2015-11-13 DIAGNOSIS — E1165 Type 2 diabetes mellitus with hyperglycemia: Secondary | ICD-10-CM

## 2015-11-13 DIAGNOSIS — K219 Gastro-esophageal reflux disease without esophagitis: Secondary | ICD-10-CM

## 2015-11-13 DIAGNOSIS — F39 Unspecified mood [affective] disorder: Secondary | ICD-10-CM

## 2015-11-13 DIAGNOSIS — I10 Essential (primary) hypertension: Secondary | ICD-10-CM | POA: Diagnosis not present

## 2015-11-19 DIAGNOSIS — E1165 Type 2 diabetes mellitus with hyperglycemia: Secondary | ICD-10-CM | POA: Diagnosis not present

## 2015-11-22 DIAGNOSIS — B351 Tinea unguium: Secondary | ICD-10-CM

## 2015-12-05 LAB — HM DIABETES EYE EXAM

## 2015-12-10 ENCOUNTER — Encounter: Payer: Self-pay | Admitting: Internal Medicine

## 2016-01-16 DIAGNOSIS — E1142 Type 2 diabetes mellitus with diabetic polyneuropathy: Secondary | ICD-10-CM

## 2016-01-16 DIAGNOSIS — G8193 Hemiplegia, unspecified affecting right nondominant side: Secondary | ICD-10-CM

## 2016-01-16 DIAGNOSIS — F0150 Vascular dementia without behavioral disturbance: Secondary | ICD-10-CM | POA: Diagnosis not present

## 2016-01-16 DIAGNOSIS — F39 Unspecified mood [affective] disorder: Secondary | ICD-10-CM | POA: Diagnosis not present

## 2016-02-17 DIAGNOSIS — R112 Nausea with vomiting, unspecified: Secondary | ICD-10-CM | POA: Diagnosis not present

## 2016-03-04 DIAGNOSIS — E039 Hypothyroidism, unspecified: Secondary | ICD-10-CM

## 2016-03-04 DIAGNOSIS — E1142 Type 2 diabetes mellitus with diabetic polyneuropathy: Secondary | ICD-10-CM

## 2016-03-04 DIAGNOSIS — E785 Hyperlipidemia, unspecified: Secondary | ICD-10-CM | POA: Diagnosis not present

## 2016-03-04 DIAGNOSIS — I69398 Other sequelae of cerebral infarction: Secondary | ICD-10-CM | POA: Diagnosis not present

## 2016-03-04 DIAGNOSIS — F0150 Vascular dementia without behavioral disturbance: Secondary | ICD-10-CM

## 2016-03-04 DIAGNOSIS — I10 Essential (primary) hypertension: Secondary | ICD-10-CM | POA: Diagnosis not present

## 2016-03-04 DIAGNOSIS — K219 Gastro-esophageal reflux disease without esophagitis: Secondary | ICD-10-CM

## 2016-03-04 DIAGNOSIS — F39 Unspecified mood [affective] disorder: Secondary | ICD-10-CM

## 2016-05-04 DIAGNOSIS — E1142 Type 2 diabetes mellitus with diabetic polyneuropathy: Secondary | ICD-10-CM | POA: Diagnosis not present

## 2016-05-04 DIAGNOSIS — G8192 Hemiplegia, unspecified affecting left dominant side: Secondary | ICD-10-CM | POA: Diagnosis not present

## 2016-05-04 DIAGNOSIS — F0150 Vascular dementia without behavioral disturbance: Secondary | ICD-10-CM | POA: Diagnosis not present

## 2016-05-04 DIAGNOSIS — I10 Essential (primary) hypertension: Secondary | ICD-10-CM | POA: Diagnosis not present

## 2016-05-04 DIAGNOSIS — F39 Unspecified mood [affective] disorder: Secondary | ICD-10-CM

## 2016-07-10 DIAGNOSIS — I69354 Hemiplegia and hemiparesis following cerebral infarction affecting left non-dominant side: Secondary | ICD-10-CM

## 2016-07-10 DIAGNOSIS — E11610 Type 2 diabetes mellitus with diabetic neuropathic arthropathy: Secondary | ICD-10-CM | POA: Diagnosis not present

## 2016-07-10 DIAGNOSIS — K219 Gastro-esophageal reflux disease without esophagitis: Secondary | ICD-10-CM | POA: Diagnosis not present

## 2016-07-10 DIAGNOSIS — F0630 Mood disorder due to known physiological condition, unspecified: Secondary | ICD-10-CM

## 2016-07-10 DIAGNOSIS — F0150 Vascular dementia without behavioral disturbance: Secondary | ICD-10-CM

## 2016-07-10 DIAGNOSIS — E785 Hyperlipidemia, unspecified: Secondary | ICD-10-CM | POA: Diagnosis not present

## 2016-07-10 DIAGNOSIS — I10 Essential (primary) hypertension: Secondary | ICD-10-CM | POA: Diagnosis not present

## 2016-09-07 DIAGNOSIS — J069 Acute upper respiratory infection, unspecified: Secondary | ICD-10-CM | POA: Diagnosis not present

## 2016-11-13 DIAGNOSIS — K219 Gastro-esophageal reflux disease without esophagitis: Secondary | ICD-10-CM | POA: Diagnosis not present

## 2016-11-13 DIAGNOSIS — F0150 Vascular dementia without behavioral disturbance: Secondary | ICD-10-CM | POA: Diagnosis not present

## 2016-11-13 DIAGNOSIS — G479 Sleep disorder, unspecified: Secondary | ICD-10-CM | POA: Diagnosis not present

## 2016-11-13 DIAGNOSIS — I69351 Hemiplegia and hemiparesis following cerebral infarction affecting right dominant side: Secondary | ICD-10-CM | POA: Diagnosis not present

## 2016-11-13 DIAGNOSIS — E119 Type 2 diabetes mellitus without complications: Secondary | ICD-10-CM

## 2016-11-13 DIAGNOSIS — F39 Unspecified mood [affective] disorder: Secondary | ICD-10-CM | POA: Diagnosis not present

## 2016-11-13 DIAGNOSIS — E785 Hyperlipidemia, unspecified: Secondary | ICD-10-CM | POA: Diagnosis not present

## 2016-11-13 DIAGNOSIS — I10 Essential (primary) hypertension: Secondary | ICD-10-CM | POA: Diagnosis not present

## 2016-11-13 DIAGNOSIS — E039 Hypothyroidism, unspecified: Secondary | ICD-10-CM | POA: Diagnosis not present

## 2017-01-06 DIAGNOSIS — B351 Tinea unguium: Secondary | ICD-10-CM | POA: Diagnosis not present

## 2017-01-07 DIAGNOSIS — F39 Unspecified mood [affective] disorder: Secondary | ICD-10-CM | POA: Diagnosis not present

## 2017-01-07 DIAGNOSIS — G8191 Hemiplegia, unspecified affecting right dominant side: Secondary | ICD-10-CM

## 2017-01-07 DIAGNOSIS — E1142 Type 2 diabetes mellitus with diabetic polyneuropathy: Secondary | ICD-10-CM

## 2017-01-07 DIAGNOSIS — F0150 Vascular dementia without behavioral disturbance: Secondary | ICD-10-CM | POA: Diagnosis not present

## 2017-02-09 DIAGNOSIS — H5711 Ocular pain, right eye: Secondary | ICD-10-CM | POA: Diagnosis not present

## 2017-03-10 DIAGNOSIS — K219 Gastro-esophageal reflux disease without esophagitis: Secondary | ICD-10-CM | POA: Diagnosis not present

## 2017-03-10 DIAGNOSIS — I69398 Other sequelae of cerebral infarction: Secondary | ICD-10-CM | POA: Diagnosis not present

## 2017-03-10 DIAGNOSIS — E1140 Type 2 diabetes mellitus with diabetic neuropathy, unspecified: Secondary | ICD-10-CM | POA: Diagnosis not present

## 2017-03-10 DIAGNOSIS — F0150 Vascular dementia without behavioral disturbance: Secondary | ICD-10-CM

## 2017-03-10 DIAGNOSIS — E785 Hyperlipidemia, unspecified: Secondary | ICD-10-CM

## 2017-03-10 DIAGNOSIS — I10 Essential (primary) hypertension: Secondary | ICD-10-CM | POA: Diagnosis not present

## 2017-03-10 DIAGNOSIS — F39 Unspecified mood [affective] disorder: Secondary | ICD-10-CM

## 2017-03-29 DIAGNOSIS — J069 Acute upper respiratory infection, unspecified: Secondary | ICD-10-CM | POA: Diagnosis not present

## 2017-05-03 DIAGNOSIS — F0150 Vascular dementia without behavioral disturbance: Secondary | ICD-10-CM | POA: Diagnosis not present

## 2017-05-03 DIAGNOSIS — F39 Unspecified mood [affective] disorder: Secondary | ICD-10-CM | POA: Diagnosis not present

## 2017-05-03 DIAGNOSIS — I10 Essential (primary) hypertension: Secondary | ICD-10-CM

## 2017-05-03 DIAGNOSIS — E1142 Type 2 diabetes mellitus with diabetic polyneuropathy: Secondary | ICD-10-CM | POA: Diagnosis not present

## 2017-05-03 DIAGNOSIS — G8192 Hemiplegia, unspecified affecting left dominant side: Secondary | ICD-10-CM | POA: Diagnosis not present

## 2017-05-12 DIAGNOSIS — B351 Tinea unguium: Secondary | ICD-10-CM | POA: Diagnosis not present

## 2017-07-07 DIAGNOSIS — E1140 Type 2 diabetes mellitus with diabetic neuropathy, unspecified: Secondary | ICD-10-CM

## 2017-07-07 DIAGNOSIS — I69398 Other sequelae of cerebral infarction: Secondary | ICD-10-CM

## 2017-07-07 DIAGNOSIS — E039 Hypothyroidism, unspecified: Secondary | ICD-10-CM

## 2017-07-07 DIAGNOSIS — I10 Essential (primary) hypertension: Secondary | ICD-10-CM | POA: Diagnosis not present

## 2017-07-07 DIAGNOSIS — K219 Gastro-esophageal reflux disease without esophagitis: Secondary | ICD-10-CM | POA: Diagnosis not present

## 2017-07-07 DIAGNOSIS — F39 Unspecified mood [affective] disorder: Secondary | ICD-10-CM | POA: Diagnosis not present

## 2017-07-07 DIAGNOSIS — F0150 Vascular dementia without behavioral disturbance: Secondary | ICD-10-CM | POA: Diagnosis not present

## 2017-09-16 DIAGNOSIS — G8193 Hemiplegia, unspecified affecting right nondominant side: Secondary | ICD-10-CM | POA: Diagnosis not present

## 2017-09-16 DIAGNOSIS — F0150 Vascular dementia without behavioral disturbance: Secondary | ICD-10-CM

## 2017-09-16 DIAGNOSIS — E1142 Type 2 diabetes mellitus with diabetic polyneuropathy: Secondary | ICD-10-CM | POA: Diagnosis not present

## 2017-09-16 DIAGNOSIS — F39 Unspecified mood [affective] disorder: Secondary | ICD-10-CM

## 2017-10-25 ENCOUNTER — Telehealth: Payer: Self-pay

## 2017-10-25 NOTE — Telephone Encounter (Signed)
She is not compliant with diabetic diet and family brings in many foods she shouldn't have. It is fairly typical for her to run high--not sure why they would have checked at that point--but often over 400 due to how she eats

## 2017-10-25 NOTE — Telephone Encounter (Signed)
Patient Name: [name] RibasSOPHIE Colan Gender: Female DOB: August 12, 1940 Age: 77 Y 4 M 11 D Return Phone Number: Address: City/State/Zip: [place] [facility] Physician Tillman AbideLetvak, Richard - MD Contact Type Call Who Is Calling Nursing Home / Skilled Nursing Facility Call Type Triage / Clinical Caller Name Tom Return Phone Number Please choose phone number Facility Name [facility] Facility Number [phone] Chief Complaint Blood Sugar High Reason for Call Symptomatic Patient Initial Comment Pts blood sugar is 488. Translation No Nurse Assessment Nurse: Phebe CollaVandenberg, RN, Dondra SpryGail Date/Time [name](Eastern Time): 10/25/2017 3:00:05 AM Confirm and document reason for call. If symptomatic, describe symptoms. ---Pts blood sugar is 488. no vomiting. lantus 30 units in the a.m. no sliding scale short acting insulin. no oral dm drugs. no fever or steroids. BG was 145 friday. Does the patient have any new or worsening symptoms? ---Yes Will a triage be completed? ---Yes Related visit to physician within the last 2 weeks? ---N/A Does the PT have any chronic conditions? (i.e. diabetes, asthma, etc.) ---Yes List chronic conditions. ---htn, thyroid, dm, anxiety Is this a behavioral health or substance abuse call? ---No Guidelines Guideline Title Affirmed Question Affirmed Notes Nurse Date/Time (Eastern Time) Diabetes - High Blood Sugar Blood glucose > 400 mg/ dl (22 mmol/l) Phebe CollaVandenberg, RN, Dondra SpryGail 10/25/2017 3:05:31 AM Disp. Time [name](Eastern Time) Disposition Final User 10/25/2017 3:09:14 AM Paged On Call to Other Provider Phebe CollaVandenberg, RN, Dondra SpryGail 10/25/2017 3:09:59 AM Send To Clinical Follow Up Sherlyn LeesQueue Vandenberg, RN, Dondra SpryGail 10/25/2017 3:21:27 AM Send To RN Personal Phebe CollaVandenberg, RN, Dondra SpryGail 10/25/2017 3:36:11 AM Call Completed Phebe CollaVandenberg, RN, Dondra SpryGail 10/25/2017 3:06:01 AM Call PCP Now Yes Phebe CollaVandenberg, RN, Christiane HaGail PLEASE NOTE: All timestamps contained  within this report are represented as [place] Standard Time. CONFIDENTIALTY NOTICE: This fax transmission is intended only for the addressee. It contains information that is legally privileged, confidential or otherwise protected from use or disclosure. If you are not the intended recipient, you are strictly prohibited from reviewing, disclosing, copying using or disseminating any of this information or taking any action in reliance on or regarding this information. If you have received this fax in error, please notify us immediately by telephone so that we can arrange for its return to us. Phone: [phone], Toll-Free: [phone], Fax: [phone] Page: 2 of 2 Call Id: [id number] Caller Disagree/Comply Comply Caller Understands Yes PreDisposition InappropriateToAsk Care Advice Given Per Guideline CALL PCP NOW: You need to discuss this with your doctor. I'll page him now. If you haven't heard from the on-call doctor within 30 minutes, call again. CARE ADVICE given per Diabetes - High Blood Sugar (Adult) guideline. Comments User: Leanord AsalGail, Vandenberg, RN Date/Time [name](Eastern Time): 10/25/2017 3:35:09 AM sliding scale found. resident to get 10 units Humalog insulin sq now. recheck in bg in 1hr and then again in 4 hrs. if still elevated,call back. Paging DoctorName Phone DateTime Result/Outcome Message Type Notes Lezlie Octaveabori, Kate - MD [phone] 10/25/2017 3:09:14 AM Paged On Call to Other Provider Doctor Paged please call [facility] @ [phone] Lezlie Octaveabori, Kate - MD 10/25/2017 3:10:13 AM Paged On Call to Another Provider Message Result

## 2017-11-10 DIAGNOSIS — K219 Gastro-esophageal reflux disease without esophagitis: Secondary | ICD-10-CM

## 2017-11-10 DIAGNOSIS — E1140 Type 2 diabetes mellitus with diabetic neuropathy, unspecified: Secondary | ICD-10-CM | POA: Diagnosis not present

## 2017-11-10 DIAGNOSIS — E039 Hypothyroidism, unspecified: Secondary | ICD-10-CM | POA: Diagnosis not present

## 2017-11-10 DIAGNOSIS — I69354 Hemiplegia and hemiparesis following cerebral infarction affecting left non-dominant side: Secondary | ICD-10-CM

## 2017-11-10 DIAGNOSIS — F39 Unspecified mood [affective] disorder: Secondary | ICD-10-CM | POA: Diagnosis not present

## 2017-11-10 DIAGNOSIS — F0150 Vascular dementia without behavioral disturbance: Secondary | ICD-10-CM | POA: Diagnosis not present

## 2017-11-10 DIAGNOSIS — I10 Essential (primary) hypertension: Secondary | ICD-10-CM | POA: Diagnosis not present

## 2017-11-26 DIAGNOSIS — R05 Cough: Secondary | ICD-10-CM | POA: Diagnosis not present

## 2017-11-26 DIAGNOSIS — R0982 Postnasal drip: Secondary | ICD-10-CM | POA: Diagnosis not present

## 2018-01-13 DIAGNOSIS — I10 Essential (primary) hypertension: Secondary | ICD-10-CM

## 2018-01-13 DIAGNOSIS — F0150 Vascular dementia without behavioral disturbance: Secondary | ICD-10-CM | POA: Diagnosis not present

## 2018-01-13 DIAGNOSIS — G8192 Hemiplegia, unspecified affecting left dominant side: Secondary | ICD-10-CM | POA: Diagnosis not present

## 2018-01-13 DIAGNOSIS — F39 Unspecified mood [affective] disorder: Secondary | ICD-10-CM | POA: Diagnosis not present

## 2018-01-13 DIAGNOSIS — E1142 Type 2 diabetes mellitus with diabetic polyneuropathy: Secondary | ICD-10-CM

## 2018-03-11 DIAGNOSIS — F0150 Vascular dementia without behavioral disturbance: Secondary | ICD-10-CM

## 2018-03-11 DIAGNOSIS — K219 Gastro-esophageal reflux disease without esophagitis: Secondary | ICD-10-CM

## 2018-03-11 DIAGNOSIS — I69398 Other sequelae of cerebral infarction: Secondary | ICD-10-CM | POA: Diagnosis not present

## 2018-03-11 DIAGNOSIS — F39 Unspecified mood [affective] disorder: Secondary | ICD-10-CM

## 2018-03-11 DIAGNOSIS — E1140 Type 2 diabetes mellitus with diabetic neuropathy, unspecified: Secondary | ICD-10-CM

## 2018-03-11 DIAGNOSIS — I10 Essential (primary) hypertension: Secondary | ICD-10-CM | POA: Diagnosis not present

## 2018-03-11 DIAGNOSIS — E039 Hypothyroidism, unspecified: Secondary | ICD-10-CM | POA: Diagnosis not present

## 2018-03-18 ENCOUNTER — Inpatient Hospital Stay
Admission: EM | Admit: 2018-03-18 | Discharge: 2018-03-20 | DRG: 637 | Disposition: A | Discharge status: Skilled Nursing Facility | Payer: Medicare Other | Source: Skilled Nursing Facility | Attending: Internal Medicine | Admitting: Internal Medicine

## 2018-03-18 ENCOUNTER — Other Ambulatory Visit: Payer: Self-pay

## 2018-03-18 ENCOUNTER — Encounter: Payer: Self-pay | Admitting: Emergency Medicine

## 2018-03-18 ENCOUNTER — Inpatient Hospital Stay: Payer: Medicare Other

## 2018-03-18 DIAGNOSIS — Z79899 Other long term (current) drug therapy: Secondary | ICD-10-CM | POA: Diagnosis not present

## 2018-03-18 DIAGNOSIS — Z888 Allergy status to other drugs, medicaments and biological substances status: Secondary | ICD-10-CM | POA: Diagnosis not present

## 2018-03-18 DIAGNOSIS — Z7982 Long term (current) use of aspirin: Secondary | ICD-10-CM | POA: Diagnosis not present

## 2018-03-18 DIAGNOSIS — I129 Hypertensive chronic kidney disease with stage 1 through stage 4 chronic kidney disease, or unspecified chronic kidney disease: Secondary | ICD-10-CM | POA: Diagnosis present

## 2018-03-18 DIAGNOSIS — E1022 Type 1 diabetes mellitus with diabetic chronic kidney disease: Secondary | ICD-10-CM | POA: Diagnosis present

## 2018-03-18 DIAGNOSIS — E1010 Type 1 diabetes mellitus with ketoacidosis without coma: Secondary | ICD-10-CM | POA: Diagnosis present

## 2018-03-18 DIAGNOSIS — E785 Hyperlipidemia, unspecified: Secondary | ICD-10-CM | POA: Diagnosis present

## 2018-03-18 DIAGNOSIS — Z8249 Family history of ischemic heart disease and other diseases of the circulatory system: Secondary | ICD-10-CM | POA: Diagnosis not present

## 2018-03-18 DIAGNOSIS — Z79891 Long term (current) use of opiate analgesic: Secondary | ICD-10-CM

## 2018-03-18 DIAGNOSIS — Z794 Long term (current) use of insulin: Secondary | ICD-10-CM | POA: Diagnosis not present

## 2018-03-18 DIAGNOSIS — Z8673 Personal history of transient ischemic attack (TIA), and cerebral infarction without residual deficits: Secondary | ICD-10-CM | POA: Diagnosis not present

## 2018-03-18 DIAGNOSIS — K219 Gastro-esophageal reflux disease without esophagitis: Secondary | ICD-10-CM | POA: Diagnosis present

## 2018-03-18 DIAGNOSIS — N184 Chronic kidney disease, stage 4 (severe): Secondary | ICD-10-CM | POA: Diagnosis present

## 2018-03-18 DIAGNOSIS — J690 Pneumonitis due to inhalation of food and vomit: Secondary | ICD-10-CM | POA: Diagnosis present

## 2018-03-18 DIAGNOSIS — Z833 Family history of diabetes mellitus: Secondary | ICD-10-CM

## 2018-03-18 DIAGNOSIS — R011 Cardiac murmur, unspecified: Secondary | ICD-10-CM | POA: Diagnosis present

## 2018-03-18 DIAGNOSIS — I6529 Occlusion and stenosis of unspecified carotid artery: Secondary | ICD-10-CM | POA: Diagnosis present

## 2018-03-18 DIAGNOSIS — K920 Hematemesis: Secondary | ICD-10-CM | POA: Diagnosis present

## 2018-03-18 DIAGNOSIS — Z23 Encounter for immunization: Secondary | ICD-10-CM | POA: Diagnosis present

## 2018-03-18 DIAGNOSIS — N179 Acute kidney failure, unspecified: Secondary | ICD-10-CM | POA: Diagnosis present

## 2018-03-18 DIAGNOSIS — F0150 Vascular dementia without behavioral disturbance: Secondary | ICD-10-CM | POA: Diagnosis present

## 2018-03-18 DIAGNOSIS — Z79890 Hormone replacement therapy (postmenopausal): Secondary | ICD-10-CM | POA: Diagnosis not present

## 2018-03-18 DIAGNOSIS — E1310 Other specified diabetes mellitus with ketoacidosis without coma: Secondary | ICD-10-CM | POA: Diagnosis present

## 2018-03-18 DIAGNOSIS — Z87891 Personal history of nicotine dependence: Secondary | ICD-10-CM

## 2018-03-18 DIAGNOSIS — K922 Gastrointestinal hemorrhage, unspecified: Secondary | ICD-10-CM | POA: Diagnosis not present

## 2018-03-18 DIAGNOSIS — E1110 Type 2 diabetes mellitus with ketoacidosis without coma: Secondary | ICD-10-CM | POA: Diagnosis present

## 2018-03-18 LAB — BLOOD GAS, VENOUS
ACID-BASE EXCESS: 5.2 mmol/L — AB (ref 0.0–2.0)
BICARBONATE: 30.5 mmol/L — AB (ref 20.0–28.0)
O2 Saturation: 86.8 %
PATIENT TEMPERATURE: 37
pCO2, Ven: 46 mmHg (ref 44.0–60.0)
pH, Ven: 7.43 (ref 7.250–7.430)
pO2, Ven: 51 mmHg — ABNORMAL HIGH (ref 32.0–45.0)

## 2018-03-18 LAB — BASIC METABOLIC PANEL
ANION GAP: 12 (ref 5–15)
ANION GAP: 9 (ref 5–15)
Anion gap: 7 (ref 5–15)
BUN: 32 mg/dL — AB (ref 8–23)
BUN: 34 mg/dL — AB (ref 8–23)
BUN: 37 mg/dL — AB (ref 8–23)
CALCIUM: 9 mg/dL (ref 8.9–10.3)
CALCIUM: 8.8 mg/dL — AB (ref 8.9–10.3)
CO2: 25 mmol/L (ref 22–32)
CO2: 29 mmol/L (ref 22–32)
CO2: 27 mmol/L (ref 22–32)
CREATININE: 2.26 mg/dL — AB (ref 0.44–1.00)
Calcium: 9.1 mg/dL (ref 8.9–10.3)
Chloride: 107 mmol/L (ref 98–111)
Chloride: 109 mmol/L (ref 98–111)
Chloride: 108 mmol/L (ref 98–111)
Creatinine, Ser: 2.12 mg/dL — ABNORMAL HIGH (ref 0.44–1.00)
Creatinine, Ser: 2.23 mg/dL — ABNORMAL HIGH (ref 0.44–1.00)
GFR calc Af Amer: 25 mL/min — ABNORMAL LOW (ref >60)
GFR calc Af Amer: 23 mL/min — ABNORMAL LOW (ref >60)
GFR calc Af Amer: 23 mL/min — ABNORMAL LOW (ref >60)
GFR calc non Af Amer: 21 mL/min — ABNORMAL LOW (ref >60)
GFR calc non Af Amer: 20 mL/min — ABNORMAL LOW (ref >60)
GFR calc non Af Amer: 20 mL/min — ABNORMAL LOW (ref >60)
GLUCOSE: 236 mg/dL — AB (ref 70–99)
GLUCOSE: 133 mg/dL — AB (ref 70–99)
GLUCOSE: 178 mg/dL — AB (ref 70–99)
POTASSIUM: 3.5 mmol/L (ref 3.5–5.1)
Potassium: 3.9 mmol/L (ref 3.5–5.1)
Potassium: 4.4 mmol/L (ref 3.5–5.1)
Sodium: 144 mmol/L (ref 135–145)
Sodium: 145 mmol/L (ref 135–145)
Sodium: 144 mmol/L (ref 135–145)

## 2018-03-18 LAB — URINALYSIS, COMPLETE (UACMP) WITH MICROSCOPIC
BILIRUBIN URINE: NEGATIVE
Glucose, UA: >=500 mg/dL — AB
KETONES UR: 80 mg/dL — AB
LEUKOCYTES UA: NEGATIVE
Nitrite: NEGATIVE
Protein, ur: 100 mg/dL — AB
Specific Gravity, Urine: 1.024 (ref 1.005–1.030)
Squamous Epithelial / LPF: NONE SEEN (ref 0–5)
pH: 5 (ref 5.0–8.0)

## 2018-03-18 LAB — COMPREHENSIVE METABOLIC PANEL
ALK PHOS: 128 U/L — AB (ref 38–126)
ALT: 16 U/L (ref 0–44)
AST: 28 U/L (ref 15–41)
Albumin: 3.9 g/dL (ref 3.5–5.0)
Anion gap: 23 — ABNORMAL HIGH (ref 5–15)
BUN: 25 mg/dL — AB (ref 8–23)
CHLORIDE: 95 mmol/L — AB (ref 98–111)
CO2: 23 mmol/L (ref 22–32)
CREATININE: 1.61 mg/dL — AB (ref 0.44–1.00)
Calcium: 9.4 mg/dL (ref 8.9–10.3)
GFR calc Af Amer: 34 mL/min — ABNORMAL LOW (ref >60)
GFR, EST NON AFRICAN AMERICAN: 30 mL/min — AB (ref >60)
Glucose, Bld: 587 mg/dL (ref 70–99)
Potassium: 4.2 mmol/L (ref 3.5–5.1)
Sodium: 141 mmol/L (ref 135–145)
Total Bilirubin: 1.3 mg/dL — ABNORMAL HIGH (ref 0.3–1.2)
Total Protein: 8.1 g/dL (ref 6.5–8.1)

## 2018-03-18 LAB — CBC WITH DIFFERENTIAL/PLATELET
BASOS ABS: 0.1 10*3/uL (ref 0–0.1)
Basophils Relative: 0 %
Eosinophils Absolute: 0 10*3/uL (ref 0–0.7)
Eosinophils Relative: 0 %
HEMATOCRIT: 45.5 % (ref 35.0–47.0)
HEMOGLOBIN: 14.5 g/dL (ref 12.0–16.0)
Lymphocytes Relative: 5 %
Lymphs Abs: 0.7 10*3/uL — ABNORMAL LOW (ref 1.0–3.6)
MCH: 25.6 pg — ABNORMAL LOW (ref 26.0–34.0)
MCHC: 31.8 g/dL — ABNORMAL LOW (ref 32.0–36.0)
MCV: 80.6 fL (ref 80.0–100.0)
Monocytes Absolute: 0.4 10*3/uL (ref 0.2–0.9)
Monocytes Relative: 3 %
NEUTROS ABS: 12.9 10*3/uL — AB (ref 1.4–6.5)
NEUTROS PCT: 92 %
Platelets: 268 10*3/uL (ref 150–440)
RBC: 5.65 MIL/uL — AB (ref 3.80–5.20)
RDW: 16.9 % — ABNORMAL HIGH (ref 11.5–14.5)
WBC: 14.1 10*3/uL — AB (ref 3.6–11.0)

## 2018-03-18 LAB — TROPONIN I
TROPONIN I: 0.04 ng/mL — AB (ref <0.03)
TROPONIN I: 0.08 ng/mL — AB (ref <0.03)
Troponin I: <0.03 ng/mL (ref <0.03)
Troponin I: 0.03 ng/mL (ref <0.03)

## 2018-03-18 LAB — PROTIME-INR
INR: 1.12
Prothrombin Time: 14.3 seconds (ref 11.4–15.2)

## 2018-03-18 LAB — GLUCOSE, CAPILLARY
GLUCOSE-CAPILLARY: 331 mg/dL — AB (ref 70–99)
GLUCOSE-CAPILLARY: 257 mg/dL — AB (ref 70–99)
GLUCOSE-CAPILLARY: 186 mg/dL — AB (ref 70–99)
GLUCOSE-CAPILLARY: 115 mg/dL — AB (ref 70–99)
GLUCOSE-CAPILLARY: 117 mg/dL — AB (ref 70–99)
GLUCOSE-CAPILLARY: 164 mg/dL — AB (ref 70–99)
Glucose-Capillary: 555 mg/dL (ref 70–99)
Glucose-Capillary: 553 mg/dL (ref 70–99)
Glucose-Capillary: 444 mg/dL — ABNORMAL HIGH (ref 70–99)
Glucose-Capillary: 421 mg/dL — ABNORMAL HIGH (ref 70–99)
Glucose-Capillary: 232 mg/dL — ABNORMAL HIGH (ref 70–99)
Glucose-Capillary: 205 mg/dL — ABNORMAL HIGH (ref 70–99)
Glucose-Capillary: 146 mg/dL — ABNORMAL HIGH (ref 70–99)
Glucose-Capillary: 121 mg/dL — ABNORMAL HIGH (ref 70–99)
Glucose-Capillary: 116 mg/dL — ABNORMAL HIGH (ref 70–99)

## 2018-03-18 LAB — TYPE AND SCREEN
ABO/RH(D): O NEG
Antibody Screen: NEGATIVE

## 2018-03-18 LAB — HEMOGLOBIN AND HEMATOCRIT, BLOOD
HCT: 41.9 % (ref 35.0–47.0)
HCT: 44.9 % (ref 35.0–47.0)
HEMATOCRIT: 39.8 % (ref 35.0–47.0)
HEMOGLOBIN: 14.1 g/dL (ref 12.0–16.0)
HEMOGLOBIN: 12.7 g/dL (ref 12.0–16.0)
Hemoglobin: 13.4 g/dL (ref 12.0–16.0)

## 2018-03-18 LAB — LIPASE, BLOOD: LIPASE: 18 U/L (ref 11–51)

## 2018-03-18 MED ORDER — SODIUM CHLORIDE 0.9 % IV SOLN
80.0000 mg | Freq: Once | INTRAVENOUS | Status: DC
  Filled 2018-03-18: qty 80

## 2018-03-18 MED ORDER — SODIUM CHLORIDE 0.9 % IV SOLN
INTRAVENOUS | Status: DC
  Administered 2018-03-18: 4.9 [IU]/h via INTRAVENOUS
  Filled 2018-03-18: qty 1

## 2018-03-18 MED ORDER — METOPROLOL SUCCINATE ER 25 MG PO TB24
25.0000 mg | ORAL_TABLET | Freq: Every day | ORAL | Status: DC
  Administered 2018-03-19 – 2018-03-20 (×2): 25 mg via ORAL
  Filled 2018-03-18 (×4): qty 1

## 2018-03-18 MED ORDER — ASPIRIN 81 MG PO CHEW: 81.0000 mg | CHEWABLE_TABLET | Freq: Every day | ORAL | Status: DC

## 2018-03-18 MED ORDER — SODIUM CHLORIDE 0.9 % IV BOLUS
500.0000 mL | Freq: Once | INTRAVENOUS | Status: AC
  Administered 2018-03-18: 500 mL via INTRAVENOUS

## 2018-03-18 MED ORDER — PNEUMOCOCCAL VAC POLYVALENT 25 MCG/0.5ML IJ INJ: 0.5000 mL | INJECTION | INTRAMUSCULAR | Status: DC

## 2018-03-18 MED ORDER — SODIUM CHLORIDE 0.9 % IV SOLN: INTRAVENOUS | Status: AC

## 2018-03-18 MED ORDER — LEVOTHYROXINE SODIUM 88 MCG PO TABS
88.0000 ug | ORAL_TABLET | Freq: Every day | ORAL | Status: DC
  Administered 2018-03-19 – 2018-03-20 (×2): 88 ug via ORAL
  Filled 2018-03-18 (×2): qty 1

## 2018-03-18 MED ORDER — CITALOPRAM HYDROBROMIDE 10 MG PO TABS
10.0000 mg | ORAL_TABLET | Freq: Every day | ORAL | Status: DC
  Administered 2018-03-18 – 2018-03-20 (×3): 10 mg via ORAL
  Filled 2018-03-18 (×3): qty 1

## 2018-03-18 MED ORDER — ONDANSETRON HCL 4 MG/2ML IJ SOLN
4.0000 mg | Freq: Once | INTRAMUSCULAR | Status: AC
  Administered 2018-03-18: 4 mg via INTRAVENOUS
  Filled 2018-03-18: qty 2

## 2018-03-18 MED ORDER — SODIUM CHLORIDE 0.9 % IV SOLN
INTRAVENOUS | Status: DC
  Filled 2018-03-18: qty 1

## 2018-03-18 MED ORDER — TRAZODONE HCL 50 MG PO TABS
50.0000 mg | ORAL_TABLET | Freq: Every day | ORAL | Status: DC
  Filled 2018-03-18: qty 1

## 2018-03-18 MED ORDER — SODIUM CHLORIDE 0.9 % IV SOLN: INTRAVENOUS | Status: DC

## 2018-03-18 MED ORDER — POTASSIUM CHLORIDE 10 MEQ/100ML IV SOLN
10.0000 meq | INTRAVENOUS | Status: AC
  Administered 2018-03-18 (×2): 10 meq via INTRAVENOUS
  Filled 2018-03-18 (×2): qty 100

## 2018-03-18 MED ORDER — HEPARIN SODIUM (PORCINE) 5000 UNIT/ML IJ SOLN: 5000.0000 [IU] | Freq: Three times a day (TID) | INTRAMUSCULAR | Status: DC

## 2018-03-18 MED ORDER — GABAPENTIN 300 MG PO CAPS
300.0000 mg | ORAL_CAPSULE | Freq: Every evening | ORAL | Status: DC
  Administered 2018-03-18 – 2018-03-19 (×2): 300 mg via ORAL
  Filled 2018-03-18 (×2): qty 1

## 2018-03-18 MED ORDER — SODIUM CHLORIDE 0.9 % IV SOLN
INTRAVENOUS | Status: DC
  Administered 2018-03-18 – 2018-03-19 (×2): via INTRAVENOUS

## 2018-03-18 MED ORDER — SODIUM CHLORIDE 0.9 % IV SOLN
80.0000 mg | Freq: Once | INTRAVENOUS | Status: AC
  Administered 2018-03-18: 80 mg via INTRAVENOUS
  Filled 2018-03-18: qty 80

## 2018-03-18 MED ORDER — DEXTROSE-NACL 5-0.45 % IV SOLN
INTRAVENOUS | Status: DC
  Administered 2018-03-18: 15:00:00 via INTRAVENOUS

## 2018-03-18 MED ORDER — AMLODIPINE BESYLATE 10 MG PO TABS
10.0000 mg | ORAL_TABLET | Freq: Every day | ORAL | Status: DC
  Administered 2018-03-19 – 2018-03-20 (×2): 10 mg via ORAL
  Filled 2018-03-18 (×3): qty 1

## 2018-03-18 MED ORDER — PANTOPRAZOLE SODIUM 40 MG PO TBEC
40.0000 mg | DELAYED_RELEASE_TABLET | Freq: Every day | ORAL | Status: DC
  Administered 2018-03-18 – 2018-03-20 (×3): 40 mg via ORAL
  Filled 2018-03-18 (×3): qty 1

## 2018-03-18 MED ORDER — INSULIN ASPART 100 UNIT/ML ~~LOC~~ SOLN
0.0000 [IU] | SUBCUTANEOUS | Status: DC
  Administered 2018-03-19: 5 [IU] via SUBCUTANEOUS
  Administered 2018-03-19: 3 [IU] via SUBCUTANEOUS
  Administered 2018-03-19 (×2): 5 [IU] via SUBCUTANEOUS
  Administered 2018-03-20: 8 [IU] via SUBCUTANEOUS
  Administered 2018-03-20: 2 [IU] via SUBCUTANEOUS
  Administered 2018-03-20: 3 [IU] via SUBCUTANEOUS
  Filled 2018-03-18 (×7): qty 1

## 2018-03-18 MED ORDER — SODIUM CHLORIDE 0.9 % IV SOLN
8.0000 mg/h | INTRAVENOUS | Status: DC
  Administered 2018-03-18 – 2018-03-20 (×5): 8 mg/h via INTRAVENOUS
  Filled 2018-03-18: qty 40
  Filled 2018-03-18 (×4): qty 80
  Filled 2018-03-18: qty 40

## 2018-03-18 MED ORDER — INSULIN ASPART 100 UNIT/ML ~~LOC~~ SOLN
10.0000 [IU] | Freq: Once | SUBCUTANEOUS | Status: AC
  Administered 2018-03-18: 10 [IU] via SUBCUTANEOUS
  Filled 2018-03-18: qty 1

## 2018-03-18 MED ORDER — PRAVASTATIN SODIUM 40 MG PO TABS
40.0000 mg | ORAL_TABLET | Freq: Every day | ORAL | Status: DC
  Administered 2018-03-18 – 2018-03-19 (×2): 40 mg via ORAL
  Filled 2018-03-18: qty 1
  Filled 2018-03-18: qty 2

## 2018-03-18 MED ORDER — INSULIN GLARGINE 100 UNIT/ML ~~LOC~~ SOLN
10.0000 [IU] | Freq: Every day | SUBCUTANEOUS | Status: DC
  Administered 2018-03-18 – 2018-03-20 (×2): 10 [IU] via SUBCUTANEOUS
  Filled 2018-03-18 (×5): qty 0.1

## 2018-03-18 NOTE — ED Notes (Signed)
Pt. returned from XR. 

## 2018-03-18 NOTE — ED Notes (Signed)
Pt has not had any episodes of vomiting or black diarrhea.  On arrival pt did spit up some dark color sputum, denies any abd pain or nausea at this time. Dr. Katheren ShamsSalary recently in room to discuss admission. Insulin drip started.

## 2018-03-18 NOTE — Clinical Social Work Note (Signed)
Clinical Social Work Assessment  Patient Details  Name: [name] [name] MRN: [id number] Date of Birth: [birth year]  Date of referral:  03/18/18               Reason for consult:  Other (Comment Required)([facility])                Permission sought to share information with:  Family Supports, Guardian Permission granted to share information::     Name::      [name] SimmondsCarol [name] daughter [phone] mobile   Agency::     Relationship::     Contact Information:     Housing/Transportation Living arrangements for the past 2 months:   [facility] Source of Information:   Med staff Patient Interpreter Needed:  None Criminal Activity/Legal Involvement Pertinent to Current Situation/Hospitalization:  No - Comment as needed Significant Relationships:  Adult Children Lives with:  Facility Resident Do you feel safe going back to the place where you live?  Yes Need for family participation in patient care:  Yes (Comment)  Care giving concerns: TBD    Social Worker assessment / plan: Izora RibasSophie [name]  is a 77 y.o. female with a known history per below [facility] with nausea, vomiting coffee-ground emesis, also had dark stools. Patient has GI bleed and was asleep when LCSW went to assess patient reviewed Med notes to complete assessment/ Fl2 and passr started. Patient does have dementia, Diabetes and guardian is daughter [name] SimmondsCarol [name] ( daughter).  Employment status:  Retired Health and safety inspectornsurance information:  Armed forces operational officerMedicare, BankerMedicaid Out of State PT Recommendations:  Not assessed at this time Information / Referral to community resources:    na Patient/Family's Response to care:  TBD   Patient/Family's Understanding of and Emotional Response to Diagnosis, Current Treatment, and Prognosis:  TBD Patient was calm and understood she will be admitted  Emotional Assessment Appearance:  Appears stated age Attitude/Demeanor/Rapport:  Unable to Assess(Patient  sleeping) Affect (typically observed):  Calm Orientation:  Oriented to Self Alcohol / Substance use:  Not Applicable Psych involvement (Current and /or in the community):  No (Comment)  Discharge Needs  Concerns to be addressed:  No discharge needs identified Readmission within the last 30 days:   no Current discharge risk:  None Barriers to Discharge:  No Barriers Identified   [name], Manami Tutor M, LCSW 03/18/2018, 11:40 AM

## 2018-03-18 NOTE — H&P (Addendum)
[facility]   PATIENT NAME: [name] [name]    MR#:  [id number]  DATE OF BIRTH:  1940/08/21  DATE OF ADMISSION:  03/18/2018  PRIMARY CARE PHYSICIAN: Karie Schwalbe, MD   REQUESTING/REFERRING PHYSICIAN:   CHIEF COMPLAINT:   Chief Complaint  Patient presents with  . Emesis  . GI Bleeding    HISTORY OF PRESENT ILLNESS: [name] [name]  is a 77 y.o. female with a known history per below presenting from local nursing home with nausea, vomiting coffee-ground emesis, also had dark stools, patient denying any pain, in the emergency room patient was found to have mild DKA with anion gap 23, creatinine 1.6, white count 14,000, patient evaluated in the emergency room, no apparent distress, resting comfortably in bed, history somewhat limited due to dementia, but patient is able to answer questions appropriately and is oriented, patient is now been admitted for acute mild DKA and acute coffee-ground emesis, chest x-ray pending.  PAST MEDICAL HISTORY:   Past Medical History:  Diagnosis Date  . Carotid artery occlusion 02/15/2009  . CVA (cerebral infarction)   . Dementia   . Diabetes mellitus   . GERD (gastroesophageal reflux disease)   . Heart murmur   . HLD (hyperlipidemia)   . Hypertension   . Stroke [facility])     PAST SURGICAL HISTORY:  Past Surgical History:  Procedure Laterality Date  . APPENDECTOMY    . ESOPHAGOGASTRODUODENOSCOPY Left 10/17/2015   Procedure: ESOPHAGOGASTRODUODENOSCOPY (EGD);  Surgeon: Scot Jun, MD;  Location: [facility] ENDOSCOPY;  Service: Endoscopy;  Laterality: Left;  . TONSILLECTOMY      SOCIAL HISTORY:  Social History   Tobacco Use  . Smoking status: Former Smoker    Types: Cigarettes    Last attempt to quit: 07/20/1985    Years since quitting: 32.6  . Smokeless tobacco: Never Used  Substance Use Topics  . Alcohol use: No    FAMILY HISTORY:  Family History  Problem Relation Age of Onset  . Diabetes Mother    . Hypertension Mother   . Diabetes Father   . Hypertension Father   . Diabetes Sister   . Hypertension Sister     DRUG ALLERGIES:  Allergies  Allergen Reactions  . Fexofenadine Hcl Rash    REVIEW OF SYSTEMS: Poor historian due to dementia  CONSTITUTIONAL: No fever, fatigue or weakness.  EYES: No blurred or double vision.  EARS, NOSE, AND THROAT: No tinnitus or ear pain.  RESPIRATORY: No cough, shortness of breath, wheezing or hemoptysis.  CARDIOVASCULAR: No chest pain, orthopnea, edema.  GASTROINTESTINAL: + nausea, vomiting, no diarrhea or abdominal pain.  GENITOURINARY: No dysuria, hematuria.  ENDOCRINE: No polyuria, nocturia,  HEMATOLOGY: No anemia, easy bruising or bleeding SKIN: No rash or lesion. MUSCULOSKELETAL: No joint pain or arthritis.   NEUROLOGIC: No tingling, numbness, weakness.  PSYCHIATRY: No anxiety or depression.   MEDICATIONS AT HOME:  Prior to Admission medications   Medication Sig Start Date End Date Taking? Authorizing Provider  acetaminophen (TYLENOL) 325 MG tablet Take 2 tablets (650 mg total) by mouth every 6 (six) hours as needed for mild pain (or Fever >/= 101). 05/29/15  Yes Gouru, Deanna Artis, MD  amLODipine (NORVASC) 10 MG tablet Take 0.5 tablets (5 mg total) by mouth daily. Patient taking differently: Take 10 mg by mouth daily.  05/31/15   Ramonita Lab, MD  aspirin 81 MG chewable tablet Chew 81 mg by mouth daily.    [provider]  Cholecalciferol (VITAMIN  D3) 50000 units CAPS Take 1 capsule by mouth every 30 (thirty) days. GIVE ON THE 28TH OF EVERY MONTH    [provider]  citalopram (CELEXA) 10 MG tablet Take 10 mg by mouth daily.    [provider]  furosemide (LASIX) 40 MG tablet Take 0.5 tablets (20 mg total) by mouth daily. Patient taking differently: Take 40 mg by mouth daily.  05/29/15   Ramonita LabGouru, Aruna, MD  gabapentin (NEURONTIN) 300 MG capsule Take 300 mg by mouth every evening.     [provider]  insulin  glargine (LANTUS) 100 UNIT/ML injection Inject 0.27 mLs (27 Units total) into the skin at bedtime. Patient taking differently: Inject 25 Units into the skin daily.  05/31/15   Ramonita LabGouru, Aruna, MD  levothyroxine (SYNTHROID, LEVOTHROID) 88 MCG tablet Take 88 mcg by mouth daily before breakfast.    [provider]  lovastatin (MEVACOR) 20 MG tablet Take 20 mg by mouth every evening.     [provider]  Menthol (RICOLA CHERRY HONEY HERB) 2 MG LOZG Use as directed 1 lozenge in the mouth or throat every 2 (two) hours as needed (FOR COUGH).    [provider]  metoprolol succinate (TOPROL-XL) 25 MG 24 hr tablet Take 25 mg by mouth daily.    [provider]  omeprazole (PRILOSEC) 20 MG capsule Take 20 mg by mouth 2 (two) times daily.    [provider]  ondansetron (ZOFRAN) 4 MG tablet Take 4 mg by mouth as needed for nausea or vomiting.    [provider]  potassium chloride (K-DUR) 10 MEQ tablet Take 10 mEq by mouth daily.     [provider]  promethazine (PHENERGAN) 25 MG tablet Take 12.5 mg by mouth every 8 (eight) hours as needed for nausea or vomiting. Reported on 09/05/2015    [provider]  traMADol-acetaminophen (ULTRACET) 37.5-325 MG tablet Take 1 tablet by mouth every 8 (eight) hours as needed for moderate pain or severe pain. 05/31/15   Ramonita LabGouru, Aruna, MD  traZODone (DESYREL) 50 MG tablet Take 50 mg by mouth at bedtime.    [provider]  glipiZIDE (GLUCOTROL) 10 MG tablet Take 10 mg by mouth 2 (two) times daily.   09/28/11  [provider]  hydrochlorothiazide (HYDRODIURIL) 25 MG tablet Take 25 mg by mouth daily.    09/28/11  [provider]  insulin detemir (LEVEMIR) 100 UNIT/ML injection Inject 7 Units into the skin every morning.   09/28/11  [provider]      PHYSICAL EXAMINATION:   VITAL SIGNS: Blood pressure 136/64, pulse (!) 103, temperature 99 F (37.2 C), temperature source  Oral, resp. rate 18, height 5\' 2"  (1.575 m), weight 67 kg, SpO2 100 %.  GENERAL:  77 y.o.-year-old patient lying in the bed with no acute distress.  Frail appearing EYES: Pupils equal, round, reactive to light and accommodation. No scleral icterus. Extraocular muscles intact.  HEENT: Head atraumatic, normocephalic. Oropharynx and nasopharynx clear.  Dry mucous membranes NECK:  Supple, no jugular venous distention. No thyroid enlargement, no tenderness.  Poor skin turgor LUNGS: Normal breath sounds bilaterally, no wheezing, rales,rhonchi or crepitation. No use of accessory muscles of respiration.  CARDIOVASCULAR: S1, S2 normal. No murmurs, rubs, or gallops.  ABDOMEN: Soft, nontender, nondistended. Bowel sounds present. No organomegaly or mass.  EXTREMITIES: No pedal edema, cyanosis, or clubbing.  NEUROLOGIC: Cranial nerves II through XII are intact. Muscle strength 5/5 in all extremities. Sensation intact. Gait not checked.  PSYCHIATRIC: The patient is alert and oriented x 3.  SKIN: No obvious rash, lesion, or ulcer.   LABORATORY PANEL:   CBC Recent Labs  Lab 03/18/18 0800  WBC 14.1*  HGB 14.5  HCT 45.5  PLT 268  MCV 80.6  MCH 25.6*  MCHC 31.8*  RDW 16.9*  LYMPHSABS 0.7*  MONOABS 0.4  EOSABS 0.0  BASOSABS 0.1   ------------------------------------------------------------------------------------------------------------------  Chemistries  Recent Labs  Lab 03/18/18 0800  NA 141  K 4.2  CL 95*  CO2 23  GLUCOSE 587*  BUN 25*  CREATININE 1.61*  CALCIUM 9.4  AST 28  ALT 16  ALKPHOS 128*  BILITOT 1.3*   ------------------------------------------------------------------------------------------------------------------ estimated creatinine clearance is 26.3 mL/min (A) (by C-G formula based on SCr of 1.61 mg/dL (H)). ------------------------------------------------------------------------------------------------------------------ No results for input(s): TSH, T4TOTAL,  T3FREE, THYROIDAB in the last 72 hours.  Invalid input(s): FREET3   Coagulation profile No results for input(s): INR, PROTIME in the last 168 hours. ------------------------------------------------------------------------------------------------------------------- No results for input(s): DDIMER in the last 72 hours. -------------------------------------------------------------------------------------------------------------------  Cardiac Enzymes Recent Labs  Lab 03/18/18 0800  TROPONINI <0.03   ------------------------------------------------------------------------------------------------------------------ Invalid input(s): POCBNP  ---------------------------------------------------------------------------------------------------------------  Urinalysis    Component Value Date/Time   COLORURINE YELLOW (A) 03/18/2018 0840   APPEARANCEUR HAZY (A) 03/18/2018 0840   APPEARANCEUR Clear 11/16/2013 2342   LABSPEC 1.024 03/18/2018 0840   LABSPEC 1.019 11/16/2013 2342   PHURINE 5.0 03/18/2018 0840   GLUCOSEU >=500 (A) 03/18/2018 0840   GLUCOSEU 150 mg/dL 16/04/9603 5409   HGBUR SMALL (A) 03/18/2018 0840   BILIRUBINUR NEGATIVE 03/18/2018 0840   BILIRUBINUR Negative 11/16/2013 2342   KETONESUR 80 (A) 03/18/2018 0840   PROTEINUR 100 (A) 03/18/2018 0840   UROBILINOGEN 0.2 09/22/2011 1412   NITRITE NEGATIVE 03/18/2018 0840   LEUKOCYTESUR NEGATIVE 03/18/2018 0840   LEUKOCYTESUR Negative 11/16/2013 2342     RADIOLOGY: No results found.  EKG: Orders placed or performed during the hospital encounter of 03/18/18  . EKG 12-Lead  . EKG 12-Lead    IMPRESSION AND PLAN: *Acute DKA with chronic diabetes mellitus type 2 *Acute coffee-ground emesis *Acute kidney injury *Hypertension *Dementia with history of CVAs/coronary artery occlusion *Chronic hyperlipidemia  Admit to ICU on our DKA protocol, case discussed with intensivist, rule out acute coronary syndrome with cardiac  enzymes x3 sets, strict I&O monitoring, IV fluids for rehydration, avoid nephrotoxic agents, H&H every 6 hours, gastroenterology to see, CBC daily, transfuse as needed, follow-up on outstanding chest x-ray, complete MAR when available, continue close medical monitoring   All the records are reviewed and case discussed with ED provider. Management plans discussed with the patient, family and they are in agreement.  CODE STATUS:full Code Status History    Date Active Date Inactive Code Status Order ID Comments User Context   10/16/2015 0916 10/17/2015 1938 Full Code [id number]  Wyatt Haste, MD ED   05/24/2015 1109 05/31/2015 1833 Full Code [id number]  Gale Journey, MD Inpatient   09/23/2011 0031 09/28/2011 1933 Full Code [id number]  Garnett-Mellinger, Vennie Homans, RN Inpatient    Advance Directive Documentation     Most Recent Value  Type of Advance Directive  Healthcare Power of Attorney  Pre-existing out of facility DNR order (yellow form or pink MOST form)  -  "MOST" Form in Place?  -       TOTAL TIME TAKING CARE OF THIS PATIENT: 45 minutes.    Evelena Asa Salary M.D on 03/18/2018   Between 7am to 6pm - Pager -  [phone]  After 6pm go to [email or web address] - password [email or web address]  [facility]  Office  [phone]  CC: Primary care physician; Karie Schwalbe, MD   Note: This dictation was prepared with Dragon dictation along with smaller phrase technology. Any transcriptional errors that result from this process are unintentional.

## 2018-03-18 NOTE — ED Triage Notes (Signed)
Pt arrived via EMS from [facility] with reports of coffee ground emesis and black stools.  Per EMS pt has been having clear emesis for 2 days and today noticed dark stools and coffee ground emesis.   Pt denies any pain at this time. Pt has hx of dementia.

## 2018-03-18 NOTE — Progress Notes (Signed)
Family Meeting Note  Advance Directive:yes  Today a meeting took place with the Patient.  Patient is able to participate  The following clinical team members were present during this meeting:MD  The following were discussed:Patient's diagnosis: GI bleeding, DKA, diabetes, CVA, dementia, Patient's progosis: Unable to determine and Goals for treatment: Full Code  Additional follow-up to be provided: prn  Time spent during discussion:20 minutes  Bertrum SolMontell D Shaylie Eklund, MD

## 2018-03-18 NOTE — Consult Note (Signed)
PULMONARY / CRITICAL CARE MEDICINE   Name: [name] [name] MRN: [id number] DOB: [birth year]    ADMISSION DATE:  03/18/2018   CONSULTATION DATE:  03/19/2018  REFERRING MD: Dr Katheren ShamsSalary   Reason: DKA  HISTORY OF PRESENT ILLNESS:   77 year old African-American female who presented to the ED with coffee-ground emesis for 2 days as well as dark tarry stools.  Patient has dementia and lives at [facility] memory care unit hence unable to provide any history.  At the ED she was found to be in DKA with an anion gap of 23 and admitted to the ICU.  She is currently on an insulin drip.  Her blood sugars have improved.  No further emesis or dark tarry stools reported.  She is on a Protonix infusion as well.  She is being followed by GI.  PAST MEDICAL HISTORY :  She  has a past medical history of Carotid artery occlusion (02/15/2009), CVA (cerebral infarction), Dementia, Diabetes mellitus, GERD (gastroesophageal reflux disease), Heart murmur, HLD (hyperlipidemia), Hypertension, and Stroke (HCC).  PAST SURGICAL HISTORY: She  has a past surgical history that includes Appendectomy; Tonsillectomy; and Esophagogastroduodenoscopy (Left, 10/17/2015).  Allergies  Allergen Reactions  . Fexofenadine Hcl Rash    No current facility-administered medications on file prior to encounter.    Current Outpatient Medications on File Prior to Encounter  Medication Sig  . acetaminophen (TYLENOL) 325 MG tablet Take 2 tablets (650 mg total) by mouth every 6 (six) hours as needed for mild pain (or Fever >/= 101).  Marland Kitchen. amLODipine (NORVASC) 10 MG tablet Take 0.5 tablets (5 mg total) by mouth daily.  Marland Kitchen. aspirin 81 MG chewable tablet Chew 81 mg by mouth daily.  . barrier cream (NON-SPECIFIED) CREA Apply 1 application topically 2 (two) times daily as needed (skin protection on buttocks).  . Cholecalciferol (VITAMIN D3) 50000 units CAPS Take 1 capsule by mouth every 30 (thirty) days. GIVE ON THE 28TH OF EVERY MONTH  . citalopram  (CELEXA) 10 MG tablet Take 5 mg by mouth daily.   . furosemide (LASIX) 40 MG tablet Take 0.5 tablets (20 mg total) by mouth daily.  Marland Kitchen. gabapentin (NEURONTIN) 300 MG capsule Take 300 mg by mouth every evening.   Marland Kitchen. guaiFENesin (ROBITUSSIN) 100 MG/5ML liquid Take 200 mg by mouth every 4 (four) hours as needed for cough.  . hydroxypropyl methylcellulose / hypromellose (ISOPTO TEARS / GONIOVISC) 2.5 % ophthalmic solution Place 1 drop into both eyes 3 (three) times daily as needed for dry eyes.  . insulin glargine (LANTUS) 100 UNIT/ML injection Inject 0.27 mLs (27 Units total) into the skin at bedtime. (Patient taking differently: Inject 34 Units into the skin every morning. )  . levothyroxine (SYNTHROID, LEVOTHROID) 88 MCG tablet Take 88 mcg by mouth daily before breakfast.  . loratadine (CLARITIN) 10 MG tablet Take 10 mg by mouth daily as needed for allergies.  Marland Kitchen. lovastatin (MEVACOR) 20 MG tablet Take 20 mg by mouth every evening.   . magnesium hydroxide (MILK OF MAGNESIA) 400 MG/5ML suspension Take 30 mLs by mouth 2 (two) times daily as needed for mild constipation or moderate constipation.  . memantine (NAMENDA) 5 MG tablet Take 5 mg by mouth 2 (two) times daily.  . Menthol (RICOLA CHERRY HONEY HERB) 2 MG LOZG Use as directed 1 lozenge in the mouth or throat every 2 (two) hours as needed (FOR COUGH).  Marland Kitchen. metoprolol succinate (TOPROL-XL) 25 MG 24 hr tablet Take 25 mg by mouth daily.  Marland Kitchen. omeprazole (PRILOSEC)  20 MG capsule Take 20 mg by mouth daily.   . ondansetron (ZOFRAN) 4 MG tablet Take 4 mg by mouth daily as needed for nausea or vomiting.   . polyethylene glycol (MIRALAX / GLYCOLAX) packet Take 17 g by mouth daily.  . potassium chloride (K-DUR) 10 MEQ tablet Take 10 mEq by mouth daily.   . traZODone (DESYREL) 50 MG tablet Take 25 mg by mouth at bedtime.   . triamcinolone cream (KENALOG) 0.1 % Apply 1 application topically 3 (three) times daily as needed (skin conditions).  . [DISCONTINUED] glipiZIDE  (GLUCOTROL) 10 MG tablet Take 10 mg by mouth 2 (two) times daily.   . [DISCONTINUED] hydrochlorothiazide (HYDRODIURIL) 25 MG tablet Take 25 mg by mouth daily.    . [DISCONTINUED] insulin detemir (LEVEMIR) 100 UNIT/ML injection Inject 7 Units into the skin every morning.     FAMILY HISTORY:  Her family history includes Diabetes in her father, mother, and sister; Hypertension in her father, mother, and sister.  SOCIAL HISTORY: She  reports that she quit smoking about 32 years ago. Her smoking use included cigarettes. She has never used smokeless tobacco. She reports that she does not drink alcohol or use drugs.  REVIEW OF SYSTEMS:   Unable to obtain due to dementia  SUBJECTIVE:   VITAL SIGNS: BP (!) 95/48   Pulse 85   Temp 98.1 F (36.7 C) (Oral)   Resp 15   Ht 5\' 1"  (1.549 m)   Wt 67 kg   SpO2 99%   BMI 27.91 kg/m   HEMODYNAMICS:    VENTILATOR SETTINGS:    INTAKE / OUTPUT: I/O last 3 completed shifts: In: 839.5 [I.V.:240.2; IV Piggyback:599.3] Out: -   PHYSICAL EXAMINATION: General: NAD Neuro:  AAO X1, confused, follows some basic commands HEENT:  PERRLA, Trachea midline Cardiovascular: RRR, S1, S2, no MRG, +2 pulses Lungs: bilateral breath sounds, no wheezing Abdomen:  Non-distended, +bowel sounds Musculoskeletal:  +rom Skin:  Warm and dry LABS:  BMET Recent Labs  Lab 03/18/18 0800 03/18/18 1605 03/18/18 1914  NA 141 144 145  K 4.2 3.5 3.9  CL 95* 107 109  CO2 23 25 29   BUN 25* 32* 34*  CREATININE 1.61* 2.12* 2.26*  GLUCOSE 587* 236* 133*    Electrolytes Recent Labs  Lab 03/18/18 0800 03/18/18 1605 03/18/18 1914  CALCIUM 9.4 9.1 9.0    CBC Recent Labs  Lab 03/18/18 0800 03/18/18 1324 03/18/18 1650  WBC 14.1*  --   --   HGB 14.5 13.4 14.1  HCT 45.5 41.9 44.9  PLT 268  --   --     Coag's Recent Labs  Lab 03/18/18 1324  INR 1.12    Sepsis Markers No results for input(s): LATICACIDVEN, PROCALCITON, O2SATVEN in the last 168  hours.  ABG No results for input(s): PHART, PCO2ART, PO2ART in the last 168 hours.  Liver Enzymes Recent Labs  Lab 03/18/18 0800  AST 28  ALT 16  ALKPHOS 128*  BILITOT 1.3*  ALBUMIN 3.9    Cardiac Enzymes Recent Labs  Lab 03/18/18 0800 03/18/18 1324 03/18/18 1605  TROPONINI <0.03 0.03* 0.04*    Glucose Recent Labs  Lab 03/18/18 1712 03/18/18 1813 03/18/18 1913 03/18/18 2006 03/18/18 2108 03/18/18 2208  GLUCAP 186* 146* 121* 116* 115* 117*    Imaging Dg Chest 2 View  Result Date: 03/18/2018 CLINICAL DATA:  Cough and ground emesis EXAM: CHEST - 2 VIEW COMPARISON:  05/24/2015 FINDINGS: Cardiac shadow is enlarged but stable. Aortic calcifications are  again seen. Elevation the right hemidiaphragm is again noted. No focal infiltrate or sizable effusion is seen. No acute bony abnormality is noted. IMPRESSION: No acute abnormality noted. Electronically Signed   By: Alcide Clever M.D.   On: 03/18/2018 11:31     STUDIES:  none  CULTURES: Blood cultures x 2  ANTIBIOTICS: none  SIGNIFICANT EVENTS: 03/18/2018  LINES/TUBES: PIVs  DISCUSSION: 77 y/o FEMALE presenting with acute GI bleed, DKA and aspiration pneumonitis  ASSESSMENT  DKA GI bleed Aspiration pneumonitis Acute renal failure Vascular dementia  PLAN IV insulin and transition to SQ insulin HgA1C level in AM GI consult Trend creatinine Zosyn for aspiration pneumonitis IV fluids Resume all home medications Safety precautions Protonix infusion Trend hemoglobin and hematocrit GI and DVT prophylaxis   FAMILY  - Updates: No family at bedside. Will update when available.   Caedyn Tassinari S. [facility] ANP-BC Pulmonary and Critical Care Medicine [facility] Pager [phone] or [phone]  NB: This document was prepared using Dragon voice recognition software and may include unintentional dictation errors.    03/18/2018, 10:24 PM

## 2018-03-18 NOTE — Progress Notes (Signed)
Patient transferred from ED with RN into ICU 06. Patient is sleepy but arousable and oriented to self and place. Vitals are stable. Admission CBG 232, insulin gtt adjusted per protocol. Will continue to monitor closely.

## 2018-03-18 NOTE — ED Notes (Signed)
Pt transported to XR.  

## 2018-03-18 NOTE — ED Notes (Signed)
Pt to ICU with [name] BattenKim RN on cardiac monitor, insulin drip and protonix drip. At time of leaving ED pt resting, ABCs intact, VSS, NAD

## 2018-03-18 NOTE — [place] FL2 (Signed)
[place] MEDICAID FL2 LEVEL OF CARE SCREENING TOOL     IDENTIFICATION  Patient Name: [name] [name] Birthdate: 03/04/1941 Sex: female Admission Date (Current Location): 03/18/2018  [place]   Facility and Address:  [facility], [address], [place], [place] [address]      Provider Number: [id number]  Attending Physician Name and Address:  Bertrum SolSalary, Montell D, MD  Relative Name and Phone Number:   Anders SimmondsCarol Holmes daughter [phone]    Current Level of Care: Hospital Recommended Level of Care: Skilled Nursing Facility, Memory Care Prior Approval Number:    Date Approved/Denied:   PASRR Number:  [id number]  Discharge Plan: SNF    Current Diagnoses: Patient Active Problem List   Diagnosis Date Noted  . DKA (diabetic ketoacidoses) (HCC) 03/18/2018  . Upper GI bleed 10/16/2015  . Intractable nausea and vomiting 10/16/2015  . Hypertensive urgency 10/16/2015  . Alzheimer's disease 05/30/2015  . Altered mental status 05/24/2015  . Occlusion and stenosis of carotid artery without mention of cerebral infarction 10/08/2011  . CKD (chronic kidney disease) 09/25/2011  . H/O: CVA (cardiovascular accident) 09/22/2011  . Diabetes mellitus, type 2 (HCC) 09/22/2011    Orientation RESPIRATION BLADDER Height & Weight     Self  Normal Incontinent Weight: 147 lb 12.8 oz (67 kg)(bedscale) Height:  5\' 2"  (157.5 cm)  BEHAVIORAL SYMPTOMS/MOOD NEUROLOGICAL BOWEL NUTRITION STATUS      Continent Diet(Diabetic)  AMBULATORY STATUS COMMUNICATION OF NEEDS Skin   Independent Verbally Normal                       Personal Care Assistance Level of Assistance  Bathing, Feeding, Dressing, Total care Bathing Assistance: Limited assistance Feeding assistance: Independent Dressing Assistance: Limited assistance Total Care Assistance: Limited assistance   Functional Limitations Info  Sight, Hearing, Speech Sight Info:  Adequate Hearing Info: Impaired(HOH) Speech Info: Adequate    SPECIAL CARE FACTORS FREQUENCY                       Contractures Contractures Info: Not present    Additional Factors Info  Allergies   Allergies Info: Fexofenadine           Current Medications (03/18/2018):  This is the current hospital active medication list Current Facility-Administered Medications  Medication Dose Route Frequency Provider Last Rate Last Dose  . insulin regular (NOVOLIN R,HUMULIN R) 100 Units in sodium chloride 0.9 % 100 mL (1 Units/mL) infusion   Intravenous Continuous Emily FilbertWilliams, Jonathan E, MD 3.8 mL/hr at 03/18/18 1120 3.8 Units/hr at 03/18/18 1120  . pantoprazole (PROTONIX) 80 mg in sodium chloride 0.9 % 250 mL (0.32 mg/mL) infusion  8 mg/hr Intravenous Continuous Salary, Montell D, MD       Current Outpatient Medications  Medication Sig Dispense Refill  . acetaminophen (TYLENOL) 325 MG tablet Take 2 tablets (650 mg total) by mouth every 6 (six) hours as needed for mild pain (or Fever >/= 101).    Marland Kitchen. amLODipine (NORVASC) 10 MG tablet Take 0.5 tablets (5 mg total) by mouth daily. 15 tablet 0  . aspirin 81 MG chewable tablet Chew 81 mg by mouth daily.    . barrier cream (NON-SPECIFIED) CREA Apply 1 application topically 2 (two) times daily as needed (skin protection on buttocks).    . Cholecalciferol (VITAMIN D3) 50000 units CAPS Take 1 capsule by mouth every 30 (thirty) days. GIVE ON THE 28TH OF EVERY MONTH    .  citalopram (CELEXA) 10 MG tablet Take 5 mg by mouth daily.     . furosemide (LASIX) 40 MG tablet Take 0.5 tablets (20 mg total) by mouth daily. 30 tablet 0  . gabapentin (NEURONTIN) 300 MG capsule Take 300 mg by mouth every evening.     Marland Kitchen guaiFENesin (ROBITUSSIN) 100 MG/5ML liquid Take 200 mg by mouth every 4 (four) hours as needed for cough.    . hydroxypropyl methylcellulose / hypromellose (ISOPTO TEARS / GONIOVISC) 2.5 % ophthalmic solution Place 1 drop into both eyes 3 (three)  times daily as needed for dry eyes.    . insulin glargine (LANTUS) 100 UNIT/ML injection Inject 0.27 mLs (27 Units total) into the skin at bedtime. (Patient taking differently: Inject 34 Units into the skin every morning. ) 10 mL 11  . levothyroxine (SYNTHROID, LEVOTHROID) 88 MCG tablet Take 88 mcg by mouth daily before breakfast.    . loratadine (CLARITIN) 10 MG tablet Take 10 mg by mouth daily as needed for allergies.    Marland Kitchen lovastatin (MEVACOR) 20 MG tablet Take 20 mg by mouth every evening.     . magnesium hydroxide (MILK OF MAGNESIA) 400 MG/5ML suspension Take 30 mLs by mouth 2 (two) times daily as needed for mild constipation or moderate constipation.    . memantine (NAMENDA) 5 MG tablet Take 5 mg by mouth 2 (two) times daily.    . Menthol (RICOLA CHERRY HONEY HERB) 2 MG LOZG Use as directed 1 lozenge in the mouth or throat every 2 (two) hours as needed (FOR COUGH).    Marland Kitchen metoprolol succinate (TOPROL-XL) 25 MG 24 hr tablet Take 25 mg by mouth daily.    Marland Kitchen omeprazole (PRILOSEC) 20 MG capsule Take 20 mg by mouth daily.     . ondansetron (ZOFRAN) 4 MG tablet Take 4 mg by mouth daily as needed for nausea or vomiting.     . polyethylene glycol (MIRALAX / GLYCOLAX) packet Take 17 g by mouth daily.    . potassium chloride (K-DUR) 10 MEQ tablet Take 10 mEq by mouth daily.     . traZODone (DESYREL) 50 MG tablet Take 25 mg by mouth at bedtime.     . triamcinolone cream (KENALOG) 0.1 % Apply 1 application topically 3 (three) times daily as needed (skin conditions).       Discharge Medications: Please see discharge summary for a list of discharge medications.  Relevant Imaging Results:  Relevant Lab Results:   Additional Information SSN:  [id number]  Cheron Schaumann, [place]

## 2018-03-18 NOTE — ED Notes (Signed)
Salary MD notified of Troponin 0.03, NSR showing on monitor.

## 2018-03-18 NOTE — ED Provider Notes (Signed)
[facility] Emergency Department Provider Note       Time seen: ----------------------------------------- 7:50 AM on 03/18/2018 -----------------------------------------   I have reviewed the triage vital signs and the nursing notes.  HISTORY   Chief Complaint No chief complaint on file.    HPI [name] [name] is a 77 y.o. female with a history of CVA, dementia, diabetes, GERD, hyperlipidemia, hypertension who presents to the ED for possible GI bleeding.  Patient has dementia and cannot give further review of systems or report.  EMS reports patient has vomited dark material after vomiting clear liquids for several days.  She also had dark appearing stool.  Past Medical History:  Diagnosis Date  . Carotid artery occlusion 02/15/2009  . CVA (cerebral infarction)   . Dementia   . Diabetes mellitus   . GERD (gastroesophageal reflux disease)   . Heart murmur   . HLD (hyperlipidemia)   . Hypertension   . Stroke [facility](HCC)     Patient Active Problem List   Diagnosis Date Noted  . Upper GI bleed 10/16/2015  . Intractable nausea and vomiting 10/16/2015  . Hypertensive urgency 10/16/2015  . Alzheimer's disease 05/30/2015  . Altered mental status 05/24/2015  . Occlusion and stenosis of carotid artery without mention of cerebral infarction 10/08/2011  . CKD (chronic kidney disease) 09/25/2011  . H/O: CVA (cardiovascular accident) 09/22/2011  . Diabetes mellitus, type 2 (HCC) 09/22/2011    Past Surgical History:  Procedure Laterality Date  . APPENDECTOMY    . ESOPHAGOGASTRODUODENOSCOPY Left 10/17/2015   Procedure: ESOPHAGOGASTRODUODENOSCOPY (EGD);  Surgeon: Scot Junobert T Elliott, MD;  [facility];  Service: Endoscopy;  Laterality: Left;  . TONSILLECTOMY      Allergies Fexofenadine hcl  Social History Social History   Tobacco Use  . Smoking status: Former Smoker    Types: Cigarettes    Last attempt to quit: 07/20/1985    Years since quitting:  32.6  . Smokeless tobacco: Never Used  Substance Use Topics  . Alcohol use: No  . Drug use: No   Review of Systems Constitutional: Negative for fever. Cardiovascular: Negative for chest pain. Respiratory: Negative for shortness of breath. Gastrointestinal: Negative for abdominal pain, positive for vomiting Musculoskeletal: Negative for back pain. Skin: Negative for rash. Neurological: Negative for headaches, focal weakness or numbness.  All systems negative/normal/unremarkable except as stated in the HPI  ____________________________________________   PHYSICAL EXAM:  VITAL SIGNS: ED Triage Vitals  Enc Vitals Group     BP      Pulse      Resp      Temp      Temp src      SpO2      Weight      Height      Head Circumference      Peak Flow      Pain Score      Pain Loc      Pain Edu?      Excl. in GC?    Constitutional: Alert but disoriented.  Well appearing and in no distress. Eyes: Conjunctivae are normal. Normal extraocular movements. ENT   Head: Normocephalic and atraumatic.   Nose: No congestion/rhinnorhea.   Mouth/Throat: Mucous membranes are moist.   Neck: No stridor. Cardiovascular: Normal rate, regular rhythm. No murmurs, rubs, or gallops. Respiratory: Normal respiratory effort without tachypnea nor retractions. Breath sounds are clear and equal bilaterally. No wheezes/rales/rhonchi. Gastrointestinal: Soft and nontender. Normal bowel sounds Musculoskeletal: Nontender with normal range of motion in  extremities. No lower extremity tenderness nor edema. Neurologic:  Normal speech and language. No gross focal neurologic deficits are appreciated.  Skin:  Skin is warm, dry and intact. No rash noted. Psychiatric: Mood and affect are normal. Speech and behavior are normal.  ____________________________________________  EKG: Interpreted by me.  Sinus tachycardia with a rate of 109 bpm, normal PR interval, normal QRS, long  QT  ____________________________________________  ED COURSE:  As part of my medical decision making, I reviewed the following data within the electronic MEDICAL RECORD NUMBER History obtained from family if available, nursing notes, old chart and ekg, as well as notes from prior ED visits. Patient presented for possible GI bleeding, we will assess with labs and imaging as indicated at this time.   Procedures ____________________________________________   LABS (pertinent positives/negatives)  Labs Reviewed  CBC WITH DIFFERENTIAL/PLATELET - Abnormal; Notable for the following components:      Result Value   WBC 14.1 (*)    RBC 5.65 (*)    MCH 25.6 (*)    MCHC 31.8 (*)    RDW 16.9 (*)    Neutro Abs 12.9 (*)    Lymphs Abs 0.7 (*)    All other components within normal limits  COMPREHENSIVE METABOLIC PANEL - Abnormal; Notable for the following components:   Chloride 95 (*)    Glucose, Bld 587 (*)    BUN 25 (*)    Creatinine, Ser 1.61 (*)    Alkaline Phosphatase 128 (*)    Total Bilirubin 1.3 (*)    GFR calc non Af Amer 30 (*)    GFR calc Af Amer 34 (*)    Anion gap 23 (*)    All other components within normal limits  URINALYSIS, COMPLETE (UACMP) WITH MICROSCOPIC - Abnormal; Notable for the following components:   Color, Urine YELLOW (*)    APPearance HAZY (*)    Glucose, UA >=500 (*)    Hgb urine dipstick SMALL (*)    Ketones, ur 80 (*)    Protein, ur 100 (*)    Bacteria, UA FEW (*)    All other components within normal limits  GLUCOSE, CAPILLARY - Abnormal; Notable for the following components:   Glucose-Capillary 555 (*)    All other components within normal limits  LIPASE, BLOOD  TROPONIN I  BLOOD GAS, VENOUS  TYPE AND SCREEN   ____________________________________________  CRITICAL CARE Performed by: Ulice Dash   Total critical care time: 30 minutes  Critical care time was exclusive of separately billable procedures and treating other  patients.  Critical care was necessary to treat or prevent imminent or life-threatening deterioration.  Critical care was time spent personally by me on the following activities: development of treatment plan with patient and/or surrogate as well as nursing, discussions with consultants, evaluation of patient's response to treatment, examination of patient, obtaining history from patient or surrogate, ordering and performing treatments and interventions, ordering and review of laboratory studies, ordering and review of radiographic studies, pulse oximetry and re-evaluation of patient's condition.   DIFFERENTIAL DIAGNOSIS   Gastroenteritis, dehydration, anemia, GI bleeding, coagulopathy  FINAL ASSESSMENT AND PLAN  GI bleeding, DKA   Plan: The patient had presented for vomiting dark material. Patient's labs revealed leukocytosis as well as chronic kidney disease and possible DKA.  She did have an elevated glucose and anion gap of 23.  We started her on an insulin drip.  She received IV Protonix for possible GI bleeding and IV Zofran as well.  I  will discuss with the hospitalist for admission   Ulice Dash, MD   Note: This note was generated in part or whole with voice recognition software. Voice recognition is usually quite accurate but there are transcription errors that can and very often do occur. I apologize for any typographical errors that were not detected and corrected.     Emily Filbert, MD 03/18/18 [phone]

## 2018-03-18 NOTE — Progress Notes (Signed)
LCSW attempted to engage with patient and she was sleeping will attempt to assess later.  Dovid Bartko LCSW

## 2018-03-19 DIAGNOSIS — K922 Gastrointestinal hemorrhage, unspecified: Secondary | ICD-10-CM

## 2018-03-19 LAB — GLUCOSE, CAPILLARY
Glucose-Capillary: 120 mg/dL — ABNORMAL HIGH (ref 70–99)
Glucose-Capillary: 164 mg/dL — ABNORMAL HIGH (ref 70–99)
Glucose-Capillary: 168 mg/dL — ABNORMAL HIGH (ref 70–99)
Glucose-Capillary: 182 mg/dL — ABNORMAL HIGH (ref 70–99)
Glucose-Capillary: 202 mg/dL — ABNORMAL HIGH (ref 70–99)
Glucose-Capillary: 216 mg/dL — ABNORMAL HIGH (ref 70–99)
Glucose-Capillary: 234 mg/dL — ABNORMAL HIGH (ref 70–99)

## 2018-03-19 LAB — HEMOGLOBIN A1C
Hgb A1c MFr Bld: 11 % — ABNORMAL HIGH (ref 4.8–5.6)
Mean Plasma Glucose: 269 mg/dL

## 2018-03-19 LAB — COMPREHENSIVE METABOLIC PANEL
ALBUMIN: 3 g/dL — AB (ref 3.5–5.0)
ALK PHOS: 81 U/L (ref 38–126)
ALT: 12 U/L (ref 0–44)
AST: 21 U/L (ref 15–41)
Anion gap: 4 — ABNORMAL LOW (ref 5–15)
BILIRUBIN TOTAL: 0.5 mg/dL (ref 0.3–1.2)
BUN: 35 mg/dL — ABNORMAL HIGH (ref 8–23)
CALCIUM: 8.7 mg/dL — AB (ref 8.9–10.3)
CO2: 29 mmol/L (ref 22–32)
CREATININE: 2.03 mg/dL — AB (ref 0.44–1.00)
Chloride: 111 mmol/L (ref 98–111)
GFR calc Af Amer: 26 mL/min — ABNORMAL LOW (ref >60)
GFR, EST NON AFRICAN AMERICAN: 22 mL/min — AB (ref >60)
GLUCOSE: 182 mg/dL — AB (ref 70–99)
POTASSIUM: 4 mmol/L (ref 3.5–5.1)
Sodium: 144 mmol/L (ref 135–145)
TOTAL PROTEIN: 6.1 g/dL — AB (ref 6.5–8.1)

## 2018-03-19 LAB — CBC
HEMATOCRIT: 37.3 % (ref 35.0–47.0)
Hemoglobin: 11.8 g/dL — ABNORMAL LOW (ref 12.0–16.0)
MCH: 25 pg — ABNORMAL LOW (ref 26.0–34.0)
MCHC: 31.7 g/dL — AB (ref 32.0–36.0)
MCV: 78.7 fL — ABNORMAL LOW (ref 80.0–100.0)
PLATELETS: 218 10*3/uL (ref 150–440)
RBC: 4.74 MIL/uL (ref 3.80–5.20)
RDW: 16.9 % — AB (ref 11.5–14.5)
WBC: 15.4 10*3/uL — AB (ref 3.6–11.0)

## 2018-03-19 LAB — PROCALCITONIN: Procalcitonin: 0.22 ng/mL

## 2018-03-19 LAB — PHOSPHORUS: PHOSPHORUS: 1.6 mg/dL — AB (ref 2.5–4.6)

## 2018-03-19 LAB — MAGNESIUM: Magnesium: 1.9 mg/dL (ref 1.7–2.4)

## 2018-03-19 LAB — TSH: TSH: 1.28 u[IU]/mL (ref 0.350–4.500)

## 2018-03-19 MED ORDER — SODIUM PHOSPHATES 45 MMOLE/15ML IV SOLN
15.0000 mmol | Freq: Once | INTRAVENOUS | Status: AC
  Administered 2018-03-19: 15 mmol via INTRAVENOUS
  Filled 2018-03-19: qty 5

## 2018-03-19 MED ORDER — PIPERACILLIN-TAZOBACTAM 3.375 G IVPB
3.3750 g | Freq: Three times a day (TID) | INTRAVENOUS | Status: DC
  Administered 2018-03-19 – 2018-03-20 (×3): 3.375 g via INTRAVENOUS
  Filled 2018-03-19 (×3): qty 50

## 2018-03-19 NOTE — Consult Note (Signed)
Wyline Mood , MD [address], [place], [place], [address] [address], [place], [place], [address] Phone: [phone]  Fax: [phone]  Consultation  Referring Provider:   DR Imogene Burn  Primary Care Physician:  Karie Schwalbe, MD Primary Gastroenterologist:  Markham Jordan         Reason for Consultation:     GI bleed  Date of Admission:  03/18/2018 Date of Consultation:  03/19/2018         HPI:   [name] [name] is a 77 y.o. female is a patient of Dr. Mechele Collin who was performed an upper endoscopy in March 2017 and was found to have gastritis.  She has a history of dementia and she presents from a nursing home with nausea and coffee-ground emesis, dark stools.  In the emergency room was found to have a DKA.  On admission hemoglobin is 14.5 g with a white cell count of 14.1.  Creatinine was 1.6 with a glucose of 587, troponin elevated at 0.03 and rose to 0.04 yesterday evening and further close to 0.08 with elevation of creatinine to 2.23.  She denies any hematemesis or melena. She says she has no abdominal pain or discomfort. She was aware of the year (2019), aware of her location(hospital). She clearly stated she didn't want any procedures at this time.   Past Medical History:  Diagnosis Date  . Carotid artery occlusion 02/15/2009  . CVA (cerebral infarction)   . Dementia   . Diabetes mellitus   . GERD (gastroesophageal reflux disease)   . Heart murmur   . HLD (hyperlipidemia)   . Hypertension   . Stroke [facility])     Past Surgical History:  Procedure Laterality Date  . APPENDECTOMY    . ESOPHAGOGASTRODUODENOSCOPY Left 10/17/2015   Procedure: ESOPHAGOGASTRODUODENOSCOPY (EGD);  Surgeon: Scot Jun, MD;  Location: [facility] ENDOSCOPY;  Service: Endoscopy;  Laterality: Left;  . TONSILLECTOMY      Prior to Admission medications   Medication Sig Start Date End Date Taking? Authorizing Provider  acetaminophen (TYLENOL) 325 MG tablet Take 2 tablets (650 mg total) by  mouth every 6 (six) hours as needed for mild pain (or Fever >/= 101). 05/29/15  Yes Gouru, Deanna Artis, MD  amLODipine (NORVASC) 10 MG tablet Take 0.5 tablets (5 mg total) by mouth daily. 05/31/15  Yes Gouru, Deanna Artis, MD  aspirin 81 MG chewable tablet Chew 81 mg by mouth daily.   Yes [provider]  barrier cream (NON-SPECIFIED) CREA Apply 1 application topically 2 (two) times daily as needed (skin protection on buttocks).   Yes [provider]  Cholecalciferol (VITAMIN D3) 50000 units CAPS Take 1 capsule by mouth every 30 (thirty) days. GIVE ON THE 28TH OF EVERY MONTH   Yes [provider]  citalopram (CELEXA) 10 MG tablet Take 5 mg by mouth daily.    Yes [provider]  furosemide (LASIX) 40 MG tablet Take 0.5 tablets (20 mg total) by mouth daily. 05/29/15  Yes Gouru, Deanna Artis, MD  gabapentin (NEURONTIN) 300 MG capsule Take 300 mg by mouth every evening.    Yes [provider]  guaiFENesin (ROBITUSSIN) 100 MG/5ML liquid Take 200 mg by mouth every 4 (four) hours as needed for cough.   Yes [provider]  hydroxypropyl methylcellulose / hypromellose (ISOPTO TEARS / GONIOVISC) 2.5 % ophthalmic solution Place 1 drop into both eyes 3 (three) times daily as needed for dry eyes.   Yes [provider]  insulin glargine (LANTUS)  100 UNIT/ML injection Inject 0.27 mLs (27 Units total) into the skin at bedtime. Patient taking differently: Inject 34 Units into the skin every morning.  05/31/15  Yes Gouru, Deanna Artis, MD  levothyroxine (SYNTHROID, LEVOTHROID) 88 MCG tablet Take 88 mcg by mouth daily before breakfast.   Yes [provider]  loratadine (CLARITIN) 10 MG tablet Take 10 mg by mouth daily as needed for allergies.   Yes [provider]  lovastatin (MEVACOR) 20 MG tablet Take 20 mg by mouth every evening.    Yes [provider]  magnesium hydroxide (MILK OF MAGNESIA) 400 MG/5ML suspension Take 30 mLs by mouth 2 (two) times daily  as needed for mild constipation or moderate constipation.   Yes [provider]  memantine (NAMENDA) 5 MG tablet Take 5 mg by mouth 2 (two) times daily.   Yes [provider]  Menthol (RICOLA CHERRY HONEY HERB) 2 MG LOZG Use as directed 1 lozenge in the mouth or throat every 2 (two) hours as needed (FOR COUGH).   Yes [provider]  metoprolol succinate (TOPROL-XL) 25 MG 24 hr tablet Take 25 mg by mouth daily.   Yes [provider]  omeprazole (PRILOSEC) 20 MG capsule Take 20 mg by mouth daily.    Yes [provider]  ondansetron (ZOFRAN) 4 MG tablet Take 4 mg by mouth daily as needed for nausea or vomiting.    Yes [provider]  polyethylene glycol (MIRALAX / GLYCOLAX) packet Take 17 g by mouth daily.   Yes [provider]  potassium chloride (K-DUR) 10 MEQ tablet Take 10 mEq by mouth daily.    Yes [provider]  traZODone (DESYREL) 50 MG tablet Take 25 mg by mouth at bedtime.    Yes [provider]  triamcinolone cream (KENALOG) 0.1 % Apply 1 application topically 3 (three) times daily as needed (skin conditions).   Yes [provider]  glipiZIDE (GLUCOTROL) 10 MG tablet Take 10 mg by mouth 2 (two) times daily.   09/28/11  [provider]  hydrochlorothiazide (HYDRODIURIL) 25 MG tablet Take 25 mg by mouth daily.    09/28/11  [provider]  insulin detemir (LEVEMIR) 100 UNIT/ML injection Inject 7 Units into the skin every morning.   09/28/11  [provider]    Family History  Problem Relation Age of Onset  . Diabetes Mother   . Hypertension Mother   . Diabetes Father   . Hypertension Father   . Diabetes Sister   . Hypertension Sister      Social History   Tobacco Use  . Smoking status: Former Smoker    Types: Cigarettes    Last attempt to quit: 07/20/1985    Years since quitting: 32.6  . Smokeless tobacco: Never Used  Substance Use Topics  . Alcohol use: No  .  Drug use: No    Allergies as of 03/18/2018 - Review Complete 03/18/2018  Allergen Reaction Noted  . Fexofenadine hcl Rash 05/27/2011    Review of Systems:    All systems reviewed and negative except where noted in HPI.   Physical Exam:  Vital signs in last 24 hours: Temp:  [98.1 F (36.7 C)-98.2 F (36.8 C)] 98.1 F (36.7 C) (08/31 0200) Pulse Rate:  [84-107] 86 (08/31 0900) Resp:  [11-33] 11 (08/31 0900) BP: (95-150)/(43-113) 122/57 (08/31 0900) SpO2:  [96 %-100 %] 97 % (08/31 0900) Weight:  [67 kg] 67 kg (08/30 1500) Last BM Date: 03/18/18 General:  Pleasant, cooperative in NAD Head:  Normocephalic and atraumatic. Eyes:   No icterus.   Conjunctiva pink. PERRLA. Ears:  Normal auditory acuity. Neck:  Supple; no masses or thyroidomegaly Lungs: Respirations even and unlabored. Lungs clear to auscultation bilaterally.   No wheezes, crackles, or rhonchi.  Heart:  Regular rate and rhythm;  Without murmur, clicks, rubs or gallops Abdomen:  Soft, nondistended, nontender. Normal bowel sounds. No appreciable masses or hepatomegaly.  No rebound or guarding.  Neurologic:  Alert and oriented x2;  grossly normal neurologically. Skin:  Intact without significant lesions or rashes. Cervical Nodes:  No significant cervical adenopathy. Psych:  Alert and cooperative. Normal affect.  LAB RESULTS: Recent Labs    03/18/18 0800  03/18/18 1650 03/18/18 2303 03/19/18 0633  WBC 14.1*  --   --   --  15.4*  HGB 14.5   < > 14.1 12.7 11.8*  HCT 45.5   < > 44.9 39.8 37.3  PLT 268  --   --   --  218   < > = values in this interval not displayed.   BMET Recent Labs    03/18/18 1914 03/18/18 2303 03/19/18 0633  NA 145 144 144  K 3.9 4.4 4.0  CL 109 108 111  CO2 29 27 29   GLUCOSE 133* 178* 182*  BUN 34* 37* 35*  CREATININE 2.26* 2.23* 2.03*  CALCIUM 9.0 8.8* 8.7*   LFT Recent Labs    03/19/18 0633  PROT 6.1*  ALBUMIN 3.0*  AST 21  ALT 12  ALKPHOS 81  BILITOT 0.5    PT/INR Recent Labs    03/18/18 1324  LABPROT 14.3  INR 1.12    STUDIES: Dg Chest 2 View  Result Date: 03/18/2018 CLINICAL DATA:  Cough and ground emesis EXAM: CHEST - 2 VIEW COMPARISON:  05/24/2015 FINDINGS: Cardiac shadow is enlarged but stable. Aortic calcifications are again seen. Elevation the right hemidiaphragm is again noted. No focal infiltrate or sizable effusion is seen. No acute bony abnormality is noted. IMPRESSION: No acute abnormality noted. Electronically Signed   By: Alcide CleverMark  Lukens M.D.   On: 03/18/2018 11:31      Impression / Plan:   [name] [name] is a 77 y.o. y/o female with dementia and a history of gastritis admitted from a nursing home with nausea vomiting and coffee-ground emesis.  Mild drop in hemoglobin from her baseline.  Elevated troponin which continued to rise when checked yesterday.  Admission complicated by AKI.  Likely prerenal.  On admission glucose was over 550 mg/dL.  She may have had an episode of gastroparesis leading to nausea vomiting and some coffee-ground emesis/ mallory weiss tear.  At this point of time suggest to rehydrate her help resolve the AKI evaluate the elevated troponins and monitor the CBC. Patient does not want any procedures at this time. We can watch and see , if her Hb is stable probably could avoid an endoscopy . Monitor CBC, troponins . In the interim continue PPI  Thank you for involving me in the care of this patient.      LOS: 1 day   Wyline MoodKiran Teriyah Purington, MD  03/19/2018, 9:33 AM

## 2018-03-19 NOTE — Progress Notes (Signed)
Pharmacy Electrolyte Monitoring Consult:  Pharmacy consulted to assist in monitoring and replacing electrolytes in this 77 y.o. female admitted on 03/18/2018 with Emesis and GI Bleeding   Labs:  Sodium (mmol/L)  Date Value  03/19/2018 144  08/18/2014 141   Potassium (mmol/L)  Date Value  03/19/2018 4.0  08/18/2014 3.8   Magnesium (mg/dL)  Date Value  16/10/960408/31/2019 1.9   Phosphorus (mg/dL)  Date Value  54/09/811908/31/2019 1.6 (L)   Calcium (mg/dL)  Date Value  14/78/295608/31/2019 8.7 (L)   Calcium, Total (mg/dL)  Date Value  21/30/865701/30/2016 8.5   Albumin (g/dL)  Date Value  84/69/629508/31/2019 3.0 (L)  11/15/2013 3.5    Assessment/Plan:    Ordered Sodium Phosphate 15 mmol IV x 1.   Recheck electrolyte levels with AM labs.    Pharmacy will continue to monitor and supplement as appropriate.    Stormy CardKatsoudas,Emelie Newsom K, [facility] HospitalRPH 03/19/2018 1:39 PM

## 2018-03-19 NOTE — Progress Notes (Addendum)
   [facility]   PATIENT NAME: [name] RibasSophie [name]    MR#:  [id number]  DATE OF BIRTH:  03-05-1941  SUBJECTIVE:  CHIEF COMPLAINT:   Chief Complaint  Patient presents with  . Emesis  . GI Bleeding   Patient is demented and noncommunicative. REVIEW OF SYSTEMS:  Review of Systems  Unable to perform ROS: Dementia    DRUG ALLERGIES:   Allergies  Allergen Reactions  . Fexofenadine Hcl Rash   VITALS:  Blood pressure (!) 140/123, pulse 90, temperature 99 F (37.2 C), temperature source Oral, resp. rate 18, height 5\' 1"  (1.549 m), weight 65 kg, SpO2 99 %. PHYSICAL EXAMINATION:  Physical Exam  HENT:  Head: Normocephalic.  Eyes: Conjunctivae are normal. No scleral icterus.  Neck: Neck supple. No JVD present. No tracheal deviation present.  Cardiovascular: Normal rate, regular rhythm and normal heart sounds. Exam reveals no gallop.  No murmur heard. Pulmonary/Chest: Effort normal and breath sounds normal. No respiratory distress. She has no wheezes. She has no rales.  Abdominal: Soft. Bowel sounds are normal. She exhibits no distension. There is no tenderness. There is no rebound.  Musculoskeletal: Normal range of motion. She exhibits no edema or tenderness.  Neurological: No cranial nerve deficit.  She is demented, noncommunicative.  She follow limited commands.  Skin: No rash noted. No erythema.   LABORATORY PANEL:  Female CBC Recent Labs  Lab 03/19/18 0633  WBC 15.4*  HGB 11.8*  HCT 37.3  PLT 218   ------------------------------------------------------------------------------------------------------------------ Chemistries  Recent Labs  Lab 03/19/18 0633  NA 144  K 4.0  CL 111  CO2 29  GLUCOSE 182*  BUN 35*  CREATININE 2.03*  CALCIUM 8.7*  MG 1.9  AST 21  ALT 12  ALKPHOS 81  BILITOT 0.5   RADIOLOGY:  No results found. ASSESSMENT AND PLAN:   *Acute DKA with chronic diabetes mellitus type 2 Improved with insulin  drip and IV fluid support. Continue Lantus with sliding scale.  *Acute coffee-ground emesis. Continue Protonix IV, n.p.o., IV fluids support and follow-up with GI. Follow hemoglobin.  *Acute kidney injury on CKD stage IV.  Continue IV fluid support and follow-up BMP.  *Hypertension.  Continue Lopressor. *Dementia with history of CVAs/coronary artery occlusion.  Hold aspirin due to GI bleeding. *Chronic hyperlipidemia.  Continue Pravachol. ICU nurse practitioner suspected aspiration pneumonitis.  Continue Zosyn.  All the records are reviewed and case discussed with Care Management/Social Worker. Management plans discussed with the patient, family and they are in agreement.  CODE STATUS: Full Code  TOTAL TIME TAKING CARE OF THIS PATIENT: 33 minutes.   More than 50% of the time was spent in counseling/coordination of care: YES  POSSIBLE D/C IN 2 DAYS, DEPENDING ON CLINICAL CONDITION.   Shaune PollackQing Markiesha Delia M.D on 03/19/2018 at 1:33 PM  Between 7am to 6pm - Pager - [phone]  After 6pm go to [email or web address] - Therapist, nutritionalpassword [email or web address]  [facility] [place] [facility]

## 2018-03-19 NOTE — Progress Notes (Signed)
Pharmacy Antibiotic Note  [name] [name] is a 77 y.o. female admitted on 03/18/2018 with GI bleed.  Pharmacy has been consulted for Zosyn dosing for aspiration pneumonia.  Plan: Zosyn 3.375g IV q8h (4 hour infusion).  Height: 5\' 1"  (154.9 cm) Weight: 143 lb 3.2 oz (65 kg) IBW/kg (Calculated) : 47.8  Temp (24hrs), Avg:98.4 F (36.9 C), Min:98.1 F (36.7 C), Max:99 F (37.2 C)  Recent Labs  Lab 03/18/18 0800 03/18/18 1605 03/18/18 1914 03/18/18 2303 03/19/18 0633  WBC 14.1*  --   --   --  15.4*  CREATININE 1.61* 2.12* 2.26* 2.23* 2.03*    Estimated Creatinine Clearance: 20 mL/min (A) (by C-G formula based on SCr of 2.03 mg/dL (H)).    Allergies  Allergen Reactions  . Fexofenadine Hcl Rash    Antimicrobials this admission: Zosyn 8/31 >>   Dose adjustments this admission: N/A  Microbiology results: 8/30 BCx: pending 8/30 MRSA PCR: pending  Thank you for allowing pharmacy to be a part of this patient's care.  Stormy CardKatsoudas,[name] [name], [name] [name] 03/19/2018 10:11 AM

## 2018-03-20 LAB — BASIC METABOLIC PANEL
ANION GAP: 7 (ref 5–15)
BUN: 28 mg/dL — ABNORMAL HIGH (ref 8–23)
CO2: 26 mmol/L (ref 22–32)
Calcium: 8 mg/dL — ABNORMAL LOW (ref 8.9–10.3)
Chloride: 111 mmol/L (ref 98–111)
Creatinine, Ser: 1.82 mg/dL — ABNORMAL HIGH (ref 0.44–1.00)
GFR calc Af Amer: 30 mL/min — ABNORMAL LOW (ref >60)
GFR, EST NON AFRICAN AMERICAN: 26 mL/min — AB (ref >60)
Glucose, Bld: 128 mg/dL — ABNORMAL HIGH (ref 70–99)
POTASSIUM: 4 mmol/L (ref 3.5–5.1)
SODIUM: 144 mmol/L (ref 135–145)

## 2018-03-20 LAB — GLUCOSE, CAPILLARY
GLUCOSE-CAPILLARY: 109 mg/dL — AB (ref 70–99)
GLUCOSE-CAPILLARY: 254 mg/dL — AB (ref 70–99)
Glucose-Capillary: 141 mg/dL — ABNORMAL HIGH (ref 70–99)
Glucose-Capillary: 193 mg/dL — ABNORMAL HIGH (ref 70–99)

## 2018-03-20 LAB — CBC
HCT: 35.6 % (ref 35.0–47.0)
Hemoglobin: 11.5 g/dL — ABNORMAL LOW (ref 12.0–16.0)
MCH: 25.6 pg — ABNORMAL LOW (ref 26.0–34.0)
MCHC: 32.3 g/dL (ref 32.0–36.0)
MCV: 79.3 fL — ABNORMAL LOW (ref 80.0–100.0)
PLATELETS: 196 10*3/uL (ref 150–440)
RBC: 4.48 MIL/uL (ref 3.80–5.20)
RDW: 16.9 % — ABNORMAL HIGH (ref 11.5–14.5)
WBC: 10.5 10*3/uL (ref 3.6–11.0)

## 2018-03-20 LAB — MAGNESIUM: Magnesium: 1.9 mg/dL (ref 1.7–2.4)

## 2018-03-20 LAB — PROCALCITONIN: PROCALCITONIN: 0.13 ng/mL

## 2018-03-20 LAB — MRSA PCR SCREENING: MRSA BY PCR: NEGATIVE

## 2018-03-20 LAB — PHOSPHORUS: PHOSPHORUS: 4 mg/dL (ref 2.5–4.6)

## 2018-03-20 MED ORDER — INSULIN ASPART 100 UNIT/ML ~~LOC~~ SOLN
3.0000 [IU] | Freq: Three times a day (TID) | SUBCUTANEOUS | Status: DC
  Administered 2018-03-20: 3 [IU] via SUBCUTANEOUS
  Filled 2018-03-20: qty 1

## 2018-03-20 MED ORDER — AMOXICILLIN-POT CLAVULANATE 500-125 MG PO TABS: 1.0000 | ORAL_TABLET | Freq: Two times a day (BID) | ORAL | 0 refills | Status: AC

## 2018-03-20 MED ORDER — PANTOPRAZOLE SODIUM 40 MG PO TBEC: 40.0000 mg | DELAYED_RELEASE_TABLET | Freq: Two times a day (BID) | ORAL | Status: DC

## 2018-03-20 MED ORDER — SODIUM CHLORIDE 0.9 % IV SOLN
3.0000 g | Freq: Two times a day (BID) | INTRAVENOUS | Status: DC
  Administered 2018-03-20: 3 g via INTRAVENOUS
  Filled 2018-03-20 (×2): qty 3

## 2018-03-20 MED ORDER — AMOXICILLIN-POT CLAVULANATE 500-125 MG PO TABS
1.0000 | ORAL_TABLET | Freq: Two times a day (BID) | ORAL | Status: DC
  Filled 2018-03-20: qty 1

## 2018-03-20 MED ORDER — PNEUMOCOCCAL VAC POLYVALENT 25 MCG/0.5ML IJ INJ
0.5000 mL | INJECTION | INTRAMUSCULAR | Status: AC
  Administered 2018-03-20: 0.5 mL via INTRAMUSCULAR
  Filled 2018-03-20: qty 0.5

## 2018-03-20 NOTE — Progress Notes (Signed)
Pharmacy Electrolyte Monitoring Consult:  Pharmacy consulted to assist in monitoring and replacing electrolytes in this 77 y.o. female admitted on 03/18/2018 with Emesis and GI Bleeding   Labs:  Sodium (mmol/L)  Date Value  03/20/2018 144  08/18/2014 141   Potassium (mmol/L)  Date Value  03/20/2018 4.0  08/18/2014 3.8   Magnesium (mg/dL)  Date Value  33/54/5625 1.9   Phosphorus (mg/dL)  Date Value  63/89/3734 4.0   Calcium (mg/dL)  Date Value  28/76/8115 8.0 (L)   Calcium, Total (mg/dL)  Date Value  72/62/0355 8.5   Albumin (g/dL)  Date Value  97/41/6384 3.0 (L)  11/15/2013 3.5    Assessment/Plan:   K, Mg, Phos normal this morning. Adjusted calcium for low albumin 8.8 which is normal. No replacement today. Will recheck electrolytes tomorrow with AM labs.   Pharmacy will continue to monitor and supplement as appropriate.    Carola Frost, [facility] 03/20/2018 7:58 AM

## 2018-03-20 NOTE — Plan of Care (Signed)

## 2018-03-20 NOTE — Plan of Care (Signed)
  Problem: Nutrition: Goal: Adequate nutrition will be maintained Outcome: Not Progressing  Encourage liquid diet

## 2018-03-20 NOTE — Progress Notes (Signed)
Daughter (legal guardian) was called to inform of patient's discharge orders. Daughter was updated and had no additional questions. Will continue to monitor patient. Social worker working on discharge.

## 2018-03-20 NOTE — Progress Notes (Addendum)
Report given to [place] at [facility]. Patient will be transported via EMS. Tele monitor off. Discharge packet ready.   Update 1415: Set of vital signs taken when EMS arrived. IV's taken out, discharge packet given to EMS. Patient transported to [facility] in stable condition.

## 2018-03-20 NOTE — Clinical Social Work Note (Signed)
Patient to be d/c'ed today to [facility] room 205C.  Patient and family agreeable to plans will transport via ems RN to call report [phone].  Patient's daughter Okey Regal was notified by bedside nurse, [phone] to inform her that patient will be discharging today.    Windell Moulding, MSW, Theresia Majors [phone]

## 2018-03-20 NOTE — Discharge Summary (Signed)
[facility] [facility]   PATIENT NAME: [name] [name]    MR#:  [id number]  DATE OF BIRTH:  1941-03-13  DATE OF ADMISSION:  03/18/2018   ADMITTING PHYSICIAN: Bertrum Sol, MD  DATE OF DISCHARGE: 03/20/2018 PRIMARY CARE PHYSICIAN: Karie Schwalbe, MD   ADMISSION DIAGNOSIS:  DKA (diabetic ketoacidoses) (HCC) [E13.10] Gastrointestinal hemorrhage, unspecified gastrointestinal hemorrhage type [K92.2] Diabetic ketoacidosis without coma associated with type 1 diabetes mellitus (HCC) [E10.10] DISCHARGE DIAGNOSIS:  Active Problems:   DKA (diabetic ketoacidoses) (HCC)  SECONDARY DIAGNOSIS:   Past Medical History:  Diagnosis Date  . Carotid artery occlusion 02/15/2009  . CVA (cerebral infarction)   . Dementia   . Diabetes mellitus   . GERD (gastroesophageal reflux disease)   . Heart murmur   . HLD (hyperlipidemia)   . Hypertension   . Stroke [facility])    HOSPITAL COURSE:  *Acute DKA with chronic diabetes mellitus type 2 Improved with insulin drip and IV fluid support. Continue Lantus with sliding scale.  *Acute coffee-ground emesis. She is treated with Protonix IV, n.p.o., IV fluids support. Per Dr. Tobi Bastos, no procedure planned. No active bleeding. Change to protonix po bid. Follow hemoglobin is stable.  *Acute kidney injury on CKD stage IV.  improving with IV fluid support and follow-up BMP as outpatient.  *Hypertension.  Continue Lopressor. *Dementia with history of CVAs/coronary artery occlusion.  Hold aspirin due to GI bleeding. *Chronic hyperlipidemia.  Continue Pravachol. ICU nurse practitioner suspected aspiration pneumonitis.  treated with Zosyn. Change to augmentin for 3 more days.  DISCHARGE CONDITIONS:  Stable, discharge to SNF today. CONSULTS OBTAINED:  Treatment Team:  Wyline Mood, MD DRUG ALLERGIES:   Allergies  Allergen Reactions  . Fexofenadine Hcl Rash   DISCHARGE MEDICATIONS:   Allergies as of 03/20/2018    Reactions   Fexofenadine Hcl Rash      Medication List    STOP taking these medications   omeprazole 20 MG capsule Commonly known as:  PRILOSEC Replaced by:  pantoprazole 40 MG tablet     TAKE these medications   acetaminophen 325 MG tablet Commonly known as:  TYLENOL Take 2 tablets (650 mg total) by mouth every 6 (six) hours as needed for mild pain (or Fever >/= 101).   amLODipine 10 MG tablet Commonly known as:  NORVASC Take 0.5 tablets (5 mg total) by mouth daily.   amoxicillin-clavulanate 500-125 MG tablet Commonly known as:  AUGMENTIN Take 1 tablet (500 mg total) by mouth every 12 (twelve) hours for 3 days.   aspirin 81 MG chewable tablet Chew 81 mg by mouth daily.   barrier cream Crea Commonly known as:  non-specified Apply 1 application topically 2 (two) times daily as needed (skin protection on buttocks).   citalopram 10 MG tablet Commonly known as:  CELEXA Take 5 mg by mouth daily.   furosemide 40 MG tablet Commonly known as:  LASIX Take 0.5 tablets (20 mg total) by mouth daily.   gabapentin 300 MG capsule Commonly known as:  NEURONTIN Take 300 mg by mouth every evening.   guaiFENesin 100 MG/5ML liquid Commonly known as:  ROBITUSSIN Take 200 mg by mouth every 4 (four) hours as needed for cough.   hydroxypropyl methylcellulose / hypromellose 2.5 % ophthalmic solution Commonly known as:  ISOPTO TEARS / GONIOVISC Place 1 drop into both eyes 3 (three) times daily as needed for dry eyes.   insulin glargine 100 UNIT/ML injection Commonly known as:  LANTUS Inject 0.27 mLs (  27 Units total) into the skin at bedtime. What changed:    how much to take  when to take this   levothyroxine 88 MCG tablet Commonly known as:  SYNTHROID, LEVOTHROID Take 88 mcg by mouth daily before breakfast.   loratadine 10 MG tablet Commonly known as:  CLARITIN Take 10 mg by mouth daily as needed for allergies.   lovastatin 20 MG tablet Commonly known as:  MEVACOR Take  20 mg by mouth every evening.   magnesium hydroxide 400 MG/5ML suspension Commonly known as:  MILK OF MAGNESIA Take 30 mLs by mouth 2 (two) times daily as needed for mild constipation or moderate constipation.   memantine 5 MG tablet Commonly known as:  NAMENDA Take 5 mg by mouth 2 (two) times daily.   metoprolol succinate 25 MG 24 hr tablet Commonly known as:  TOPROL-XL Take 25 mg by mouth daily.   ondansetron 4 MG tablet Commonly known as:  ZOFRAN Take 4 mg by mouth daily as needed for nausea or vomiting.   pantoprazole 40 MG tablet Commonly known as:  PROTONIX Take 1 tablet (40 mg total) by mouth 2 (two) times daily before a meal. Replaces:  omeprazole 20 MG capsule   polyethylene glycol packet Commonly known as:  MIRALAX / GLYCOLAX Take 17 g by mouth daily.   potassium chloride 10 MEQ tablet Commonly known as:  K-DUR Take 10 mEq by mouth daily.   RICOLA CHERRY HONEY HERB 2 MG Lozg Generic drug:  Menthol Use as directed 1 lozenge in the mouth or throat every 2 (two) hours as needed (FOR COUGH).   traZODone 50 MG tablet Commonly known as:  DESYREL Take 25 mg by mouth at bedtime.   triamcinolone cream 0.1 % Commonly known as:  KENALOG Apply 1 application topically 3 (three) times daily as needed (skin conditions).   Vitamin D3 50000 units Caps Take 1 capsule by mouth every 30 (thirty) days. GIVE ON THE 28TH OF EVERY MONTH        DISCHARGE INSTRUCTIONS:  See AVS. If you experience worsening of your admission symptoms, develop shortness of breath, life threatening emergency, suicidal or homicidal thoughts you must seek medical attention immediately by calling 911 or calling your MD immediately  if symptoms less severe.  You Must read complete instructions/literature along with all the possible adverse reactions/side effects for all the Medicines you take and that have been prescribed to you. Take any new Medicines after you have completely understood and accpet  all the possible adverse reactions/side effects.   Please note  You were cared for by a hospitalist during your hospital stay. If you have any questions about your discharge medications or the care you received while you were in the hospital after you are discharged, you can call the unit and asked to speak with the hospitalist on call if the hospitalist that took care of you is not available. Once you are discharged, your primary care physician will handle any further medical issues. Please note that NO REFILLS for any discharge medications will be authorized once you are discharged, as it is imperative that you return to your primary care physician (or establish a relationship with a primary care physician if you do not have one) for your aftercare needs so that they can reassess your need for medications and monitor your lab values.    On the day of Discharge:  VITAL SIGNS:  Blood pressure (!) 154/78, pulse 83, temperature 98.4 F (36.9 C), temperature source Oral,  resp. rate 18, height 5\' 1"  (1.549 m), weight 65 kg, SpO2 96 %. PHYSICAL EXAMINATION:  GENERAL:  77 y.o.-year-old patient lying in the bed with no acute distress.  EYES: Pupils equal, round, reactive to light and accommodation. No scleral icterus. Extraocular muscles intact.  HEENT: Head atraumatic, normocephalic. Oropharynx and nasopharynx clear.  NECK:  Supple, no jugular venous distention. No thyroid enlargement, no tenderness.  LUNGS: Normal breath sounds bilaterally, no wheezing, rales,rhonchi or crepitation. No use of accessory muscles of respiration.  CARDIOVASCULAR: S1, S2 normal. No murmurs, rubs, or gallops.  ABDOMEN: Soft, non-tender, non-distended. Bowel sounds present. No organomegaly or mass.  EXTREMITIES: No pedal edema, cyanosis, or clubbing.  NEUROLOGIC: Cranial nerves II through XII are intact. Muscle strength 3-4/5 in all extremities. Sensation intact. Gait not checked.  PSYCHIATRIC: The patient is  demented. SKIN: No obvious rash, lesion, or ulcer.  DATA REVIEW:   CBC Recent Labs  Lab 03/20/18 0554  WBC 10.5  HGB 11.5*  HCT 35.6  PLT 196    Chemistries  Recent Labs  Lab 03/19/18 0633 03/20/18 0554  NA 144 144  K 4.0 4.0  CL 111 111  CO2 29 26  GLUCOSE 182* 128*  BUN 35* 28*  CREATININE 2.03* 1.82*  CALCIUM 8.7* 8.0*  MG 1.9 1.9  AST 21  --   ALT 12  --   ALKPHOS 81  --   BILITOT 0.5  --      Microbiology Results  Results for orders placed or performed during the hospital encounter of 03/18/18  Culture, blood (routine x 2)     Status: None (Preliminary result)   Collection Time: 03/18/18  4:05 PM  Result Value Ref Range Status   Specimen Description BLOOD BLOOD RIGHT HAND  Final   Special Requests   Final    BOTTLES DRAWN AEROBIC AND ANAEROBIC Blood Culture results may not be optimal due to an inadequate volume of blood received in culture bottles   Culture   Final    NO GROWTH 2 DAYS Performed at [facility], [address], [place], [place] [address]    Report Status PENDING  Incomplete  Culture, blood (routine x 2)     Status: None (Preliminary result)   Collection Time: 03/18/18  4:05 PM  Result Value Ref Range Status   Specimen Description BLOOD BLOOD RIGHT WRIST  Final   Special Requests   Final    BOTTLES DRAWN AEROBIC AND ANAEROBIC Blood Culture results may not be optimal due to an inadequate volume of blood received in culture bottles   Culture   Final    NO GROWTH 2 DAYS Performed at [facility], [address] [place], [place] [address]    Report Status PENDING  Incomplete  MRSA PCR Screening     Status: None   Collection Time: 03/20/18  3:15 AM  Result Value Ref Range Status   MRSA by PCR NEGATIVE NEGATIVE Final    Comment:        The GeneXpert MRSA Assay (FDA approved for NASAL specimens only), is one component of a comprehensive MRSA colonization surveillance program. It is not intended to diagnose  MRSA infection nor to guide or monitor treatment for MRSA infections. Performed at [facility], [address], [place], [place] [address]     RADIOLOGY:  No results found.   Management plans discussed with the patient, family and they are in agreement.  CODE STATUS: Full Code   TOTAL TIME TAKING  CARE OF THIS PATIENT: 33 minutes.    Shaune Pollack M.D on 03/20/2018 at 10:45 AM  Between 7am to 6pm - Pager - [phone]  After 6pm go to [email or web address] - Social research officer, government  [facility] [place] [facility]  Office  [phone]  CC: Primary care physician; Karie Schwalbe, MD   Note: This dictation was prepared with Dragon dictation along with smaller phrase technology. Any transcriptional errors that result from this process are unintentional.

## 2018-03-20 NOTE — Discharge Instructions (Signed)
Aspiration and fall precaution. °

## 2018-03-22 DIAGNOSIS — R112 Nausea with vomiting, unspecified: Secondary | ICD-10-CM

## 2018-03-22 DIAGNOSIS — E1010 Type 1 diabetes mellitus with ketoacidosis without coma: Secondary | ICD-10-CM | POA: Diagnosis not present

## 2018-03-23 LAB — CULTURE, BLOOD (ROUTINE X 2)
CULTURE: NO GROWTH
Culture: NO GROWTH

## 2018-05-18 DIAGNOSIS — B351 Tinea unguium: Secondary | ICD-10-CM | POA: Diagnosis not present

## 2018-05-19 DIAGNOSIS — F39 Unspecified mood [affective] disorder: Secondary | ICD-10-CM | POA: Diagnosis not present

## 2018-05-19 DIAGNOSIS — F0151 Vascular dementia with behavioral disturbance: Secondary | ICD-10-CM | POA: Diagnosis not present

## 2018-05-19 DIAGNOSIS — I10 Essential (primary) hypertension: Secondary | ICD-10-CM | POA: Diagnosis not present

## 2018-05-19 DIAGNOSIS — E1142 Type 2 diabetes mellitus with diabetic polyneuropathy: Secondary | ICD-10-CM

## 2018-05-19 DIAGNOSIS — G8192 Hemiplegia, unspecified affecting left dominant side: Secondary | ICD-10-CM

## 2018-07-08 DIAGNOSIS — E1140 Type 2 diabetes mellitus with diabetic neuropathy, unspecified: Secondary | ICD-10-CM

## 2018-07-08 DIAGNOSIS — F0150 Vascular dementia without behavioral disturbance: Secondary | ICD-10-CM

## 2018-07-08 DIAGNOSIS — I69351 Hemiplegia and hemiparesis following cerebral infarction affecting right dominant side: Secondary | ICD-10-CM | POA: Diagnosis not present

## 2018-07-15 ENCOUNTER — Inpatient Hospital Stay
Admission: EM | Admit: 2018-07-15 | Discharge: 2018-07-17 | DRG: 074 | Disposition: A | Discharge status: Skilled Nursing Facility | Payer: Medicare Other | Attending: Internal Medicine | Admitting: Internal Medicine

## 2018-07-15 ENCOUNTER — Other Ambulatory Visit: Payer: Self-pay

## 2018-07-15 ENCOUNTER — Emergency Department: Payer: Medicare Other

## 2018-07-15 ENCOUNTER — Encounter: Payer: Self-pay | Admitting: Internal Medicine

## 2018-07-15 DIAGNOSIS — E785 Hyperlipidemia, unspecified: Secondary | ICD-10-CM | POA: Diagnosis present

## 2018-07-15 DIAGNOSIS — E1165 Type 2 diabetes mellitus with hyperglycemia: Secondary | ICD-10-CM | POA: Diagnosis present

## 2018-07-15 DIAGNOSIS — Z79890 Hormone replacement therapy (postmenopausal): Secondary | ICD-10-CM

## 2018-07-15 DIAGNOSIS — Z7982 Long term (current) use of aspirin: Secondary | ICD-10-CM | POA: Diagnosis not present

## 2018-07-15 DIAGNOSIS — Z888 Allergy status to other drugs, medicaments and biological substances status: Secondary | ICD-10-CM

## 2018-07-15 DIAGNOSIS — R739 Hyperglycemia, unspecified: Secondary | ICD-10-CM | POA: Diagnosis present

## 2018-07-15 DIAGNOSIS — Z8673 Personal history of transient ischemic attack (TIA), and cerebral infarction without residual deficits: Secondary | ICD-10-CM | POA: Diagnosis not present

## 2018-07-15 DIAGNOSIS — Z87891 Personal history of nicotine dependence: Secondary | ICD-10-CM | POA: Diagnosis not present

## 2018-07-15 DIAGNOSIS — Z794 Long term (current) use of insulin: Secondary | ICD-10-CM | POA: Diagnosis not present

## 2018-07-15 DIAGNOSIS — R011 Cardiac murmur, unspecified: Secondary | ICD-10-CM | POA: Diagnosis present

## 2018-07-15 DIAGNOSIS — Z833 Family history of diabetes mellitus: Secondary | ICD-10-CM | POA: Diagnosis not present

## 2018-07-15 DIAGNOSIS — G309 Alzheimer's disease, unspecified: Secondary | ICD-10-CM | POA: Diagnosis present

## 2018-07-15 DIAGNOSIS — K3184 Gastroparesis: Secondary | ICD-10-CM | POA: Diagnosis present

## 2018-07-15 DIAGNOSIS — R0902 Hypoxemia: Secondary | ICD-10-CM | POA: Diagnosis not present

## 2018-07-15 DIAGNOSIS — K920 Hematemesis: Secondary | ICD-10-CM | POA: Diagnosis present

## 2018-07-15 DIAGNOSIS — K219 Gastro-esophageal reflux disease without esophagitis: Secondary | ICD-10-CM | POA: Diagnosis present

## 2018-07-15 DIAGNOSIS — I10 Essential (primary) hypertension: Secondary | ICD-10-CM | POA: Diagnosis present

## 2018-07-15 DIAGNOSIS — K922 Gastrointestinal hemorrhage, unspecified: Secondary | ICD-10-CM | POA: Diagnosis present

## 2018-07-15 DIAGNOSIS — Z8249 Family history of ischemic heart disease and other diseases of the circulatory system: Secondary | ICD-10-CM

## 2018-07-15 DIAGNOSIS — R110 Nausea: Secondary | ICD-10-CM

## 2018-07-15 DIAGNOSIS — E1143 Type 2 diabetes mellitus with diabetic autonomic (poly)neuropathy: Secondary | ICD-10-CM | POA: Diagnosis not present

## 2018-07-15 DIAGNOSIS — F0280 Dementia in other diseases classified elsewhere without behavioral disturbance: Secondary | ICD-10-CM | POA: Diagnosis present

## 2018-07-15 DIAGNOSIS — R000 Tachycardia, unspecified: Secondary | ICD-10-CM

## 2018-07-15 LAB — URINALYSIS, COMPLETE (UACMP) WITH MICROSCOPIC
BILIRUBIN URINE: NEGATIVE
Hgb urine dipstick: NEGATIVE
Ketones, ur: 20 mg/dL — AB
LEUKOCYTES UA: NEGATIVE
Nitrite: NEGATIVE
PH: 7 (ref 5.0–8.0)
Protein, ur: NEGATIVE mg/dL
SPECIFIC GRAVITY, URINE: 1.009 (ref 1.005–1.030)
SQUAMOUS EPITHELIAL / LPF: NONE SEEN (ref 0–5)

## 2018-07-15 LAB — BASIC METABOLIC PANEL
ANION GAP: 11 (ref 5–15)
BUN: 16 mg/dL (ref 8–23)
CALCIUM: 9 mg/dL (ref 8.9–10.3)
CO2: 24 mmol/L (ref 22–32)
CREATININE: 1.2 mg/dL — AB (ref 0.44–1.00)
Chloride: 105 mmol/L (ref 98–111)
GFR calc non Af Amer: 43 mL/min — ABNORMAL LOW (ref >60)
GFR, EST AFRICAN AMERICAN: 50 mL/min — AB (ref >60)
Glucose, Bld: 395 mg/dL — ABNORMAL HIGH (ref 70–99)
Potassium: 5.2 mmol/L — ABNORMAL HIGH (ref 3.5–5.1)
SODIUM: 140 mmol/L (ref 135–145)

## 2018-07-15 LAB — HEMOGLOBIN AND HEMATOCRIT, BLOOD
HCT: 41.4 % (ref 36.0–46.0)
HCT: 38.8 % (ref 36.0–46.0)
Hemoglobin: 12.6 g/dL (ref 12.0–15.0)
Hemoglobin: 11.7 g/dL — ABNORMAL LOW (ref 12.0–15.0)

## 2018-07-15 LAB — BLOOD GAS, VENOUS
ACID-BASE EXCESS: 0.8 mmol/L (ref 0.0–2.0)
BICARBONATE: 26.6 mmol/L (ref 20.0–28.0)
O2 SAT: 71.7 %
PCO2 VEN: 46 mmHg (ref 44.0–60.0)
PH VEN: 7.37 (ref 7.250–7.430)
Patient temperature: 37
pO2, Ven: 39 mmHg (ref 32.0–45.0)

## 2018-07-15 LAB — PROTIME-INR
INR: 1.11
Prothrombin Time: 14.2 seconds (ref 11.4–15.2)

## 2018-07-15 LAB — CBC
HCT: 41.4 % (ref 36.0–46.0)
Hemoglobin: 12.5 g/dL (ref 12.0–15.0)
MCH: 23.3 pg — ABNORMAL LOW (ref 26.0–34.0)
MCHC: 30.2 g/dL (ref 30.0–36.0)
MCV: 77.2 fL — AB (ref 80.0–100.0)
NRBC: 0 % (ref 0.0–0.2)
Platelets: 296 10*3/uL (ref 150–400)
RBC: 5.36 MIL/uL — AB (ref 3.87–5.11)
RDW: 18.1 % — AB (ref 11.5–15.5)
WBC: 8.7 10*3/uL (ref 4.0–10.5)

## 2018-07-15 LAB — LIPASE, BLOOD: Lipase: 18 U/L (ref 11–51)

## 2018-07-15 LAB — TYPE AND SCREEN
ABO/RH(D): O NEG
Antibody Screen: NEGATIVE

## 2018-07-15 LAB — GLUCOSE, CAPILLARY: Glucose-Capillary: 360 mg/dL — ABNORMAL HIGH (ref 70–99)

## 2018-07-15 LAB — APTT: APTT: 24 s (ref 24–36)

## 2018-07-15 LAB — TROPONIN I: Troponin I: <0.03 ng/mL (ref <0.03)

## 2018-07-15 LAB — MRSA PCR SCREENING: MRSA by PCR: NEGATIVE

## 2018-07-15 MED ORDER — SODIUM CHLORIDE 0.9 % IV SOLN
INTRAVENOUS | Status: DC
  Administered 2018-07-15 – 2018-07-16 (×2): via INTRAVENOUS

## 2018-07-15 MED ORDER — ONDANSETRON HCL 4 MG PO TABS: 4.0000 mg | ORAL_TABLET | Freq: Four times a day (QID) | ORAL | Status: DC | PRN

## 2018-07-15 MED ORDER — SODIUM CHLORIDE 0.9 % IV BOLUS
1000.0000 mL | Freq: Once | INTRAVENOUS | Status: AC
  Administered 2018-07-15: 1000 mL via INTRAVENOUS

## 2018-07-15 MED ORDER — ONDANSETRON HCL 4 MG/2ML IJ SOLN
4.0000 mg | Freq: Once | INTRAMUSCULAR | Status: AC | PRN
  Administered 2018-07-15: 4 mg via INTRAVENOUS
  Filled 2018-07-15: qty 2

## 2018-07-15 MED ORDER — ONDANSETRON HCL 4 MG/2ML IJ SOLN: 4.0000 mg | Freq: Four times a day (QID) | INTRAMUSCULAR | Status: DC | PRN

## 2018-07-15 MED ORDER — SODIUM CHLORIDE 0.9 % IV SOLN
8.0000 mg/h | INTRAVENOUS | Status: DC
  Administered 2018-07-15 – 2018-07-16 (×3): 8 mg/h via INTRAVENOUS
  Filled 2018-07-15 (×3): qty 80

## 2018-07-15 MED ORDER — ACETAMINOPHEN 325 MG PO TABS: 650.0000 mg | ORAL_TABLET | Freq: Four times a day (QID) | ORAL | Status: DC | PRN

## 2018-07-15 MED ORDER — ACETAMINOPHEN 650 MG RE SUPP: 650.0000 mg | Freq: Four times a day (QID) | RECTAL | Status: DC | PRN

## 2018-07-15 MED ORDER — PANTOPRAZOLE SODIUM 40 MG IV SOLR: 40.0000 mg | Freq: Two times a day (BID) | INTRAVENOUS | Status: DC

## 2018-07-15 MED ORDER — SODIUM CHLORIDE 0.9 % IV SOLN
8.0000 mg/h | INTRAVENOUS | Status: DC
  Administered 2018-07-15: 8 mg/h via INTRAVENOUS
  Filled 2018-07-15: qty 80

## 2018-07-15 MED ORDER — SODIUM CHLORIDE 0.9 % IV SOLN
80.0000 mg | Freq: Once | INTRAVENOUS | Status: AC
  Administered 2018-07-15: 80 mg via INTRAVENOUS
  Filled 2018-07-15: qty 80

## 2018-07-15 NOTE — ED Triage Notes (Signed)
Pt arrived via EMS from [facility] with reports of not feeling well, vomiting and elevated blood sugar. Pt has not been receiving Lantus insulin.  Pt did vomit with EMS x 2 and at facility.  Pt is alert to self at this time.

## 2018-07-15 NOTE — ED Notes (Signed)
ED TO INPATIENT HANDOFF REPORT  Name/Age/Gender [name] [name] 77 y.o. female  Code Status Code Status History    Date Active Date Inactive Code Status Order ID Comments User Context   03/18/2018 1145 03/20/2018 1722 Full Code [id number]  Bertrum SolSalary, Montell D, MD ED   10/16/2015 0916 10/17/2015 1938 Full Code [id number]  Hower, Cletis Athensavid K, MD ED   05/24/2015 1109 05/31/2015 1833 Full Code [id number]  Gale JourneyWalsh, Catherine P, MD Inpatient   09/23/2011 0031 09/28/2011 1933 Full Code [id number]  Garnett-Mellinger, Vennie HomansEdna Minnie, RN Inpatient    Advance Directive Documentation     Most Recent Value  Type of Advance Directive  Healthcare Power of Attorney  Pre-existing out of facility DNR order (yellow form or pink MOST form)  -  "MOST" Form in Place?  -      Home/SNF/Other From Nursing home   Chief Complaint Hyperglycemia  Level of Care/Admitting Diagnosis ED Disposition    ED Disposition Condition Comment   Admit  [facility]ospital [facility] [[id number]]  Level of Care: Med-Surg [16]  Diagnosis: GI bleed [[id number]][[id number]]  Admitting Physician: Ihor AustinPYREDDY, PAVAN [[id number]]  Attending Physician: Ihor AustinPYREDDY, PAVAN [[id number]]  Estimated length of stay: past midnight tomorrow  Certification:: I certify this patient will need inpatient services for at least 2 midnights  PT Class (Do Not Modify): Inpatient [101]  PT Acc Code (Do Not Modify): Private [1]       Medical History Past Medical History:  Diagnosis Date  . Carotid artery occlusion 02/15/2009  . CVA (cerebral infarction)   . Dementia (HCC)   . Diabetes mellitus   . GERD (gastroesophageal reflux disease)   . Heart murmur   . HLD (hyperlipidemia)   . Hypertension   . Stroke [facility](HCC)     Allergies Allergies  Allergen Reactions  . Fexofenadine Hcl Rash    IV Location/Drains/Wounds Patient Lines/Drains/Airways Status   Active Line/Drains/Airways    Name:   Placement date:   Placement time:   Site:   Days:   Peripheral IV 07/15/18 Left  Wrist   07/15/18    1055    Wrist   less than 1          Labs/Imaging Results for orders placed or performed during the hospital encounter of 07/15/18 (from the past 48 hour(s))  Glucose, capillary     Status: Abnormal   Collection Time: 07/15/18 10:47 AM  Result Value Ref Range   Glucose-Capillary 360 (H) 70 - 99 mg/dL  Basic metabolic panel     Status: Abnormal   Collection Time: 07/15/18 10:50 AM  Result Value Ref Range   Sodium 140 135 - 145 mmol/L   Potassium 5.2 (H) 3.5 - 5.1 mmol/L    Comment: HEMOLYSIS AT THIS LEVEL MAY AFFECT RESULT   Chloride 105 98 - 111 mmol/L   CO2 24 22 - 32 mmol/L   Glucose, Bld 395 (H) 70 - 99 mg/dL   BUN 16 8 - 23 mg/dL   Creatinine, Ser 2.531.20 (H) 0.44 - 1.00 mg/dL   Calcium 9.0 8.9 - 66.410.3 mg/dL   GFR calc non Af Amer 43 (L) >60 mL/min   GFR calc Af Amer 50 (L) >60 mL/min   Anion gap 11 5 - 15    Comment: Performed at [facility], [address] [place], [place] [address]  CBC     Status: Abnormal   Collection Time: 07/15/18 10:50 AM  Result Value Ref Range   WBC 8.7 4.0 - 10.5  K/uL   RBC 5.36 (H) 3.87 - 5.11 MIL/uL   Hemoglobin 12.5 12.0 - 15.0 g/dL   HCT 40.941.4 81.136.0 - 91.446.0 %   MCV 77.2 (L) 80.0 - 100.0 fL   MCH 23.3 (L) 26.0 - 34.0 pg   MCHC 30.2 30.0 - 36.0 g/dL   RDW 78.218.1 (H) 95.611.5 - 21.315.5 %   Platelets 296 150 - 400 K/uL   nRBC 0.0 0.0 - 0.2 %    Comment: Performed at [facility], [address] [place], [place] [address]  Troponin I - Add-On to previous collection     Status: None   Collection Time: 07/15/18 10:50 AM  Result Value Ref Range   Troponin I <0.03 <0.03 ng/mL    Comment: Performed at [facility], [address] [place], [place] [address]  Blood gas, venous     Status: None   Collection Time: 07/15/18 10:54 AM  Result Value Ref Range   pH, Ven 7.37 7.250 - 7.430   pCO2, Ven 46 44.0 - 60.0 mmHg   pO2, Ven 39.0 32.0 - 45.0 mmHg   Bicarbonate 26.6 20.0 - 28.0 mmol/L   Acid-Base Excess  0.8 0.0 - 2.0 mmol/L   O2 Saturation 71.7 %   Patient temperature 37.0    Collection site VEIN    Sample type VENIPUNCTURE     Comment: Performed at [facility], [address] [place], [place] [address]  Lipase, blood     Status: None   Collection Time: 07/15/18 10:56 AM  Result Value Ref Range   Lipase 18 11 - 51 U/L    Comment: Performed at [facility], [address] [place], [place] [address]  Urinalysis, Complete w Microscopic     Status: Abnormal   Collection Time: 07/15/18 11:56 AM  Result Value Ref Range   Color, Urine YELLOW (A) YELLOW   APPearance CLEAR (A) CLEAR   Specific Gravity, Urine 1.009 1.005 - 1.030   pH 7.0 5.0 - 8.0   Glucose, UA >=500 (A) NEGATIVE mg/dL   Hgb urine dipstick NEGATIVE NEGATIVE   Bilirubin Urine NEGATIVE NEGATIVE   Ketones, ur 20 (A) NEGATIVE mg/dL   Protein, ur NEGATIVE NEGATIVE mg/dL   Nitrite NEGATIVE NEGATIVE   Leukocytes, UA NEGATIVE NEGATIVE   RBC / HPF 0-5 0 - 5 RBC/hpf   WBC, UA 0-5 0 - 5 WBC/hpf   Bacteria, UA MANY (A) NONE SEEN   Squamous Epithelial / LPF NONE SEEN 0 - 5   Mucus PRESENT     Comment: Performed at [facility], [address] [place], [place] [address]  Protime-INR     Status: None   Collection Time: 07/15/18 11:56 AM  Result Value Ref Range   Prothrombin Time 14.2 11.4 - 15.2 seconds   INR 1.11     Comment: Performed at [facility], [address] [place], [place] [address]  APTT     Status: None   Collection Time: 07/15/18 11:56 AM  Result Value Ref Range   aPTT 24 24 - 36 seconds    Comment: Performed at [facility], [address] [place], [place] [address]  Type and screen     Status: None   Collection Time: 07/15/18 11:56 AM  Result Value Ref Range   ABO/RH(D) O NEG    Antibody Screen NEG    Sample Expiration      07/18/2018 Performed at [facility], [address] [place], [place]  [address]    Dg Abdomen Acute W/chest  Result  Date: 07/15/2018 CLINICAL DATA:  Vomiting and GI bleed. EXAM: DG ABDOMEN ACUTE W/ 1V CHEST COMPARISON:  03/18/2018 chest radiograph and prior studies FINDINGS: This is a low volume film. UPPER limits normal heart size and pulmonary vascular congestion noted. No pneumothorax or definite pleural effusion. Mild LEFT basilar atelectasis/scarring noted. Bowel gas pattern is unremarkable. There is no evidence of bowel obstruction or pneumoperitoneum. No suspicious calcifications are identified. No acute bony abnormalities are noted. IMPRESSION: 1. No acute abnormality within the abdomen. Unremarkable bowel gas pattern. 2. UPPER limits normal heart size, pulmonary vascular congestion and mild LEFT basilar atelectasis/scarring. Electronically Signed   By: Harmon Pier M.D.   On: 07/15/2018 11:57    Pending Labs Unresulted Labs (From admission, onward)    Start     Ordered   07/15/18 1700  Hemoglobin and hematocrit, blood  Now then every 6 hours,   STAT     07/15/18 1233   Signed and Held  Basic metabolic panel  Tomorrow morning,   R     Signed and Held          Vitals/Pain Today's Vitals   07/15/18 1038 07/15/18 1041 07/15/18 1100 07/15/18 1115  BP:  (!) 169/105 (!) 163/123 (!) 178/99  Pulse:  (!) 107 (!) 113 (!) 107  Resp:  (!) 22 (!) 23 (!) 23  Temp:  98.5 F (36.9 C)    TempSrc:  Oral    SpO2:  99% 99% 99%  Weight: 69.6 kg     Height: 5\' 2"  (1.575 m)     PainSc: 0-No pain       Isolation Precautions No active isolations  Medications Medications  pantoprazole (PROTONIX) injection 40 mg (has no administration in time range)  pantoprazole (PROTONIX) 80 mg in sodium chloride 0.9 % 250 mL (0.32 mg/mL) infusion (has no administration in time range)  ondansetron (ZOFRAN) injection 4 mg (4 mg Intravenous Given 07/15/18 1117)  sodium chloride 0.9 % bolus 1,000 mL (0 mLs Intravenous Stopped 07/15/18 1333)  pantoprazole (PROTONIX) 80 mg in sodium chloride 0.9 % 100 mL IVPB (0 mg Intravenous  Stopped 07/15/18 1231)    Mobility Wheel chair

## 2018-07-15 NOTE — Clinical Social Work Note (Signed)
Clinical Social Work Assessment  Patient Details  Name: [name] [name] MRN: [id number] Date of Birth: 1941/04/21  Date of referral:  07/15/18               Reason for consult:  Other (Comment Required)(From a facility [facility])                Permission sought to share information with:  Family Supports, Magazine features editoracility Contact Representative Permission granted to share information::  Yes, Verbal Permission Granted  Name::     [name] [name] daughter [phone]  Agency::  [facility]  Relationship::     Contact Information:     Housing/Transportation Living arrangements for the past 2 months:  Skilled Building surveyorursing Facility Source of Information:  Patient, Adult Children Patient Interpreter Needed:  None Criminal Activity/Legal Involvement Pertinent to Current Situation/Hospitalization:  No - Comment as needed Significant Relationships:  Adult Children Lives with:  Facility Resident Do you feel safe going back to the place where you live?  Yes Need for family participation in patient care:  Yes (Comment)  Care giving concerns:  Please leave a voice message on her daughters phone [phone]   Social Worker assessment / plan: LCSW introduced myself to patient  Who is alert x2- Verbal consent was given to contact her daughter [name] GarnetCarol HCPOA. Patient  77 year old female whois hard of hearing and has visual limitations with her right eye. Patient daughter reported her mom has dementia for 5 years, she is on a soft diet and is diabetic. She is able to return [facility] once discharged. She is able to walk only with 2 man assist. She uses a wheel chair. Patient has medicare and medicaid.  Employment status:  Retired Health and safety inspectornsurance information:  Armed forces operational officerMedicare, [place] Recommendations:  Not assessed at this time Information / Referral to community resources:     Patient/Family's Response to care:  Please notify daughter  Patient/Family's Understanding of and  Emotional Response to Diagnosis, Current Treatment, and Prognosis:  Patient is glad she is coming into hospital  Emotional Assessment Appearance:  Appears stated age Attitude/Demeanor/Rapport:  Engaged Affect (typically observed):  Accepting Orientation:  Oriented to Self, Oriented to Situation, Oriented to Place Alcohol / Substance use:  Not Applicable Psych involvement (Current and /or in the community):  No (Comment)  Discharge Needs  Concerns to be addressed:  Care Coordination Readmission within the last 30 days:  No Current discharge risk:  None Barriers to Discharge:  No Barriers Identified   [name], [name], LCSW 07/15/2018, 12:33 PM

## 2018-07-15 NOTE — Progress Notes (Signed)
LCSW called daughter [name] [name]- [phone]- [phone] Please keep her informed and if you call her her phone may put you to voice mail please leave a message. LCSW collected information to complete assessment and Fl2 started. She is able to return [facility].  Delta Air LinesClaudine Ivin Rosenbloom LCSW [phone]

## 2018-07-15 NOTE — ED Notes (Signed)
Returned fom xray.

## 2018-07-15 NOTE — Consult Note (Signed)
Wyline MoodKiran Avanna Sowder , MD [address], [address], [place], [place], [address] [address], [place], [place], [address] Phone: [phone]  Fax: [phone]  Consultation  Referring Provider:     Dr Tobi BastosPyreddy Primary Care Physician:  Karie SchwalbeLetvak, Richard I, MD Primary Gastroenterologist: Dr Markham JordanElliot Reason for Consultation:     GI bleed  Date of Admission:  07/15/2018 Date of Consultation:  07/15/2018         HPI:   [name] [name] is a 77 y.o. female who carries a diagnosis of gastritis and has seen Dr. Mechele CollinElliott in the past.  She presents to the emergency room today with hyperglycemia and emesis..  I had seen her in 03/08/2018 for nausea and coffee-ground emesis with dark stools.  On admission she was found to have a diabetic ketoacidosis.  On her last admission she refused any endoscopic procedures.  Today  on admission she has an elevated glucose of 364 hemoglobin of 12.5 g with an MCV of 77.  When I went to the emergency room to talk to her she was very confused she was in her bed.  She was throwing up green bilious material.  There was no coffee-ground material.  I took a Building services engineerphoto and it is filed in chart review under media.  Patient herself could not give any information as she appeared confused.  Past Medical History:  Diagnosis Date  . Carotid artery occlusion 02/15/2009  . CVA (cerebral infarction)   . Dementia (HCC)   . Diabetes mellitus   . GERD (gastroesophageal reflux disease)   . Heart murmur   . HLD (hyperlipidemia)   . Hypertension   . Stroke [facility](HCC)     Past Surgical History:  Procedure Laterality Date  . APPENDECTOMY    . ESOPHAGOGASTRODUODENOSCOPY Left 10/17/2015   Procedure: ESOPHAGOGASTRODUODENOSCOPY (EGD);  Surgeon: Scot Junobert T Elliott, MD;  [facility];  Service: Endoscopy;  Laterality: Left;  . TONSILLECTOMY      Prior to Admission medications   Medication Sig Start Date End Date Taking? Authorizing Provider  amLODipine (NORVASC) 10 MG tablet Take  0.5 tablets (5 mg total) by mouth daily. Patient taking differently: Take 10 mg by mouth daily.  05/31/15  Yes Gouru, Deanna ArtisAruna, MD  aspirin 81 MG chewable tablet Chew 81 mg by mouth daily.   Yes [provider]  Cholecalciferol (VITAMIN D3) 50000 units CAPS Take 1 capsule by mouth every 30 (thirty) days. GIVE ON THE 28TH OF EVERY MONTH   Yes [provider]  citalopram (CELEXA) 10 MG tablet Take 5 mg by mouth daily.    Yes [provider]  furosemide (LASIX) 40 MG tablet Take 0.5 tablets (20 mg total) by mouth daily. 05/29/15  Yes Gouru, Deanna ArtisAruna, MD  gabapentin (NEURONTIN) 300 MG capsule Take 300 mg by mouth every evening.    Yes [provider]  levothyroxine (SYNTHROID, LEVOTHROID) 88 MCG tablet Take 88 mcg by mouth daily before breakfast.   Yes [provider]  lovastatin (MEVACOR) 20 MG tablet Take 20 mg by mouth every evening.    Yes [provider]  memantine (NAMENDA) 10 MG tablet Take 10 mg by mouth 2 (two) times daily.    Yes [provider]  metoprolol succinate (TOPROL-XL) 25 MG 24 hr tablet Take 25 mg by mouth daily.   Yes [provider]  pantoprazole (PROTONIX) 20 MG tablet Take 20 mg by mouth daily. Take one tablet by mouth before breakfast for GERD   Yes [provider]  polyethylene glycol (MIRALAX / GLYCOLAX) packet Take 17 g by mouth daily.   Yes [provider]  potassium chloride (K-DUR) 10 MEQ tablet Take 10 mEq by mouth daily.    Yes [provider]  senna (SENOKOT) 8.6 MG tablet Take 1 tablet by mouth daily.   Yes [provider]  traZODone (DESYREL) 50 MG tablet Take 25 mg by mouth at bedtime.    Yes [provider]  acetaminophen (TYLENOL) 325 MG tablet Take 2 tablets (650 mg total) by mouth every 6 (six) hours as needed for mild pain (or Fever >/= 101). Patient not taking: Reported on 07/15/2018 05/29/15   Ramonita Lab, MD  barrier cream (NON-SPECIFIED) CREA Apply  1 application topically 2 (two) times daily as needed (skin protection on buttocks).    [provider]  guaiFENesin (ROBITUSSIN) 100 MG/5ML liquid Take 200 mg by mouth every 4 (four) hours as needed for cough.    [provider]  hydroxypropyl methylcellulose / hypromellose (ISOPTO TEARS / GONIOVISC) 2.5 % ophthalmic solution Place 1 drop into both eyes 3 (three) times daily as needed for dry eyes.    [provider]  insulin glargine (LANTUS) 100 UNIT/ML injection Inject 0.27 mLs (27 Units total) into the skin at bedtime. Patient taking differently: Inject 35 Units into the skin every morning.  05/31/15   Ramonita Lab, MD  loratadine (CLARITIN) 10 MG tablet Take 10 mg by mouth daily as needed for allergies.    [provider]  magnesium hydroxide (MILK OF MAGNESIA) 400 MG/5ML suspension Take 30 mLs by mouth 2 (two) times daily as needed for mild constipation or moderate constipation.    [provider]  Menthol (RICOLA CHERRY HONEY HERB) 2 MG LOZG Use as directed 1 lozenge in the mouth or throat every 2 (two) hours as needed (FOR COUGH).    [provider]  ondansetron (ZOFRAN) 4 MG tablet Take 4 mg by mouth daily as needed for nausea or vomiting.     [provider]  pantoprazole (PROTONIX) 40 MG tablet Take 1 tablet (40 mg total) by mouth 2 (two) times daily before a meal. Patient not taking: Reported on 07/15/2018 03/20/18   Shaune Pollack, MD  triamcinolone cream (KENALOG) 0.1 % Apply 1 application topically 3 (three) times daily as needed (skin conditions).    [provider]    Family History  Problem Relation Age of Onset  . Diabetes Mother   . Hypertension Mother   . Diabetes Father   . Hypertension Father   . Diabetes Sister   . Hypertension Sister      Social History   Tobacco Use  . Smoking status: Former Smoker    Types: Cigarettes    Last attempt to quit: 07/20/1985    Years since quitting: 33.0  . Smokeless  tobacco: Never Used  Substance Use Topics  . Alcohol use: No  . Drug use: No    Allergies as of 07/15/2018 - Review Complete 07/15/2018  Allergen Reaction Noted  . Fexofenadine hcl Rash 05/27/2011    Review of Systems:    Confused and hence unable to provide any information.   Physical Exam:  Vital signs in last 24 hours: Temp:  [98.5 F (36.9 C)] 98.5 F (36.9 C) (12/27 1041) Pulse Rate:  [107] 107 (12/27 1041) Resp:  [22] 22 (12/27 1041) BP: (169)/(105) 169/105 (12/27 1041) SpO2:  [99 %] 99 % (12/27 1041) Weight:  [69.6 kg] 69.6 kg (12/27  1038)   General:   Pleasant, confused Head:  Normocephalic and atraumatic. Eyes:   No icterus.   Conjunctiva pink. PERRLA. Ears:  Normal auditory acuity. Neck:  Supple; no masses or thyroidomegaly Lungs: Respirations even and unlabored. Lungs clear to auscultation bilaterally.   No wheezes, crackles, or rhonchi.  Heart:  Regular rate and rhythm;  Without murmur, clicks, rubs or gallops Abdomen:  Soft, nondistended, nontender. Normal bowel sounds. No appreciable masses or hepatomegaly.  No rebound or guarding.  Neurologic:  Alert and oriented x1. Skin:  Intact without significant lesions or rashes. Cervical Nodes:  No significant cervical adenopathy. Psych:  Alert and fused but cooperative.  LAB RESULTS: Recent Labs    07/15/18 1050  WBC 8.7  HGB 12.5  HCT 41.4  PLT 296   BMET Recent Labs    07/15/18 1050  NA 140  K 5.2*  CL 105  CO2 24  GLUCOSE 395*  BUN 16  CREATININE 1.20*  CALCIUM 9.0   LFT No results for input(s): PROT, ALBUMIN, AST, ALT, ALKPHOS, BILITOT, BILIDIR, IBILI in the last 72 hours. PT/INR Recent Labs    07/15/18 1156  LABPROT 14.2  INR 1.11    STUDIES: Dg Abdomen Acute W/chest  Result Date: 07/15/2018 CLINICAL DATA:  Vomiting and GI bleed. EXAM: DG ABDOMEN ACUTE W/ 1V CHEST COMPARISON:  03/18/2018 chest radiograph and prior studies FINDINGS: This is a low volume film. UPPER limits normal  heart size and pulmonary vascular congestion noted. No pneumothorax or definite pleural effusion. Mild LEFT basilar atelectasis/scarring noted. Bowel gas pattern is unremarkable. There is no evidence of bowel obstruction or pneumoperitoneum. No suspicious calcifications are identified. No acute bony abnormalities are noted. IMPRESSION: 1. No acute abnormality within the abdomen. Unremarkable bowel gas pattern. 2. UPPER limits normal heart size, pulmonary vascular congestion and mild LEFT basilar atelectasis/scarring. Electronically Signed   By: Harmon PierJeffrey  Hu M.D.   On: 07/15/2018 11:57      Impression / Plan:   [name] FlockSophie A Rack is a 77 y.o. y/o female with history of dementia and poorly controlled diabetes.  She presents to the emergency room with hematemesis and elevated blood sugars.  She had similarly presented about 4 months back and at that point of time had declined any endoscopy evaluation.  While I was in the room with her in the emergency department her vomitus was bilious green in color .  I have taken a picture and filed it under chart review in media.  It is very likely that she could have had some coffee-ground emesis  from possible esophagitis versus a Mallory-Weiss tear.  It is very likely that the hyperglycemia led to gastroparesis and contributed to her symptoms.  If she is having repeated episodes of vomiting could consider an NG tube to decompress her stomach.  Plan 1.  Monitor CBC and transfuse as needed. 2.  PPI. 3.  If there is a drop in hemoglobin then would need to consider an endoscopy. 4.  The [facility] is on call over the weekend and will follow the patient.  Thank you for involving me in the care of this patient.      LOS: 0 days   Wyline MoodKiran Rishit Burkhalter, MD  07/15/2018, 1:22 PM

## 2018-07-15 NOTE — ED Notes (Signed)
Patient off unit to xray. Lab called for blood band. Type and screen ordered.

## 2018-07-15 NOTE — ED Notes (Signed)
Patient banded with blood band. T&S sent to lab. Social worker at bedside. protonix started.

## 2018-07-15 NOTE — [place] FL2 (Addendum)
[place] MEDICAID FL2 LEVEL OF CARE SCREENING TOOL     IDENTIFICATION  Patient Name: [name] [name] Birthdate: [birth year] Sex: female Admission Date (Current Location): 07/15/2018  [place]   Facility and Address:  [facility], [address], [place], [place] [address]      Provider Number: [id number]  Attending Physician Name and Address:  Nita SickleVeronese, [place], [place]  Relative Name and Phone Number:  Anders SimmondsCarol Holmes [phone]    Current Level of Care: Hospital Recommended Level of Care: Skilled Nursing Facility Prior Approval Number:    Date Approved/Denied:   PASRR Number: [id number]  Discharge Plan: SNF    Current Diagnoses: Patient Active Problem List   Diagnosis Date Noted  . DKA (diabetic ketoacidoses) (HCC) 03/18/2018  . Upper GI bleed 10/16/2015  . Intractable nausea and vomiting 10/16/2015  . Hypertensive urgency 10/16/2015  . Alzheimer's disease (HCC) 05/30/2015  . Altered mental status 05/24/2015  . Occlusion and stenosis of carotid artery without mention of cerebral infarction 10/08/2011  . CKD (chronic kidney disease) 09/25/2011  . H/O: CVA (cardiovascular accident) 09/22/2011  . Diabetes mellitus, type 2 (HCC) 09/22/2011    Orientation RESPIRATION BLADDER Height & Weight     Self, Situation  Normal Incontinent Weight: 153 lb 8 oz (69.6 kg) Height:  5\' 2"  (157.5 cm)  BEHAVIORAL SYMPTOMS/MOOD NEUROLOGICAL BOWEL NUTRITION STATUS      Incontinent Diet(Soft diet- Diabetic)  AMBULATORY STATUS COMMUNICATION OF NEEDS Skin   Extensive Assist Verbally                         Personal Care Assistance Level of Assistance  Bathing, Feeding, Dressing, Total care Bathing Assistance: Limited assistance Feeding assistance: Limited assistance Dressing Assistance: Limited assistance Total Care Assistance: Limited assistance   Functional Limitations Info  Sight, Hearing, Speech Sight Info:  Impaired Hearing Info: Impaired Speech Info: Adequate    SPECIAL CARE FACTORS FREQUENCY   Dementia                    Contractures Contractures Info: Not present    Additional Factors Info  Allergies   Allergies Info:  Fexofenadine Hcl           Current Medications (07/15/2018):  This is the current hospital active medication list Current Facility-Administered Medications  Medication Dose Route Frequency Provider Last Rate Last Dose  . pantoprazole (PROTONIX) 80 mg in sodium chloride 0.9 % 250 mL (0.32 mg/mL) infusion  8 mg/hr Intravenous Continuous Don PerkingVeronese, [place], MD      . Melene Muller[START ON 07/18/2018] pantoprazole (PROTONIX) injection 40 mg  40 mg Intravenous Q12H Veronese, [place], MD      . sodium chloride 0.9 % bolus 1,000 mL  1,000 mL Intravenous Once Nita SickleVeronese, [place], MD       Current Outpatient Medications  Medication Sig Dispense Refill  . amLODipine (NORVASC) 10 MG tablet Take 0.5 tablets (5 mg total) by mouth daily. (Patient taking differently: Take 10 mg by mouth daily. ) 15 tablet 0  . citalopram (CELEXA) 10 MG tablet Take 5 mg by mouth daily.     . furosemide (LASIX) 40 MG tablet Take 0.5 tablets (20 mg total) by mouth daily. 30 tablet 0  . pantoprazole (PROTONIX) 20 MG tablet Take 20 mg by mouth daily. Take one tablet by mouth before breakfast for GERD    . acetaminophen (TYLENOL) 325 MG tablet Take 2 tablets (650 mg total) by mouth every  6 (six) hours as needed for mild pain (or Fever >/= 101). (Patient not taking: Reported on 07/15/2018)    . aspirin 81 MG chewable tablet Chew 81 mg by mouth daily.    . barrier cream (NON-SPECIFIED) CREA Apply 1 application topically 2 (two) times daily as needed (skin protection on buttocks).    . Cholecalciferol (VITAMIN D3) 50000 units CAPS Take 1 capsule by mouth every 30 (thirty) days. GIVE ON THE 28TH OF EVERY MONTH    . gabapentin (NEURONTIN) 300 MG capsule Take 300 mg by mouth every evening.     Marland Kitchen. guaiFENesin  (ROBITUSSIN) 100 MG/5ML liquid Take 200 mg by mouth every 4 (four) hours as needed for cough.    . hydroxypropyl methylcellulose / hypromellose (ISOPTO TEARS / GONIOVISC) 2.5 % ophthalmic solution Place 1 drop into both eyes 3 (three) times daily as needed for dry eyes.    . insulin glargine (LANTUS) 100 UNIT/ML injection Inject 0.27 mLs (27 Units total) into the skin at bedtime. (Patient taking differently: Inject 35 Units into the skin every morning. ) 10 mL 11  . levothyroxine (SYNTHROID, LEVOTHROID) 88 MCG tablet Take 88 mcg by mouth daily before breakfast.    . loratadine (CLARITIN) 10 MG tablet Take 10 mg by mouth daily as needed for allergies.    Marland Kitchen. lovastatin (MEVACOR) 20 MG tablet Take 20 mg by mouth every evening.     . magnesium hydroxide (MILK OF MAGNESIA) 400 MG/5ML suspension Take 30 mLs by mouth 2 (two) times daily as needed for mild constipation or moderate constipation.    . memantine (NAMENDA) 5 MG tablet Take 5 mg by mouth 2 (two) times daily.    . Menthol (RICOLA CHERRY HONEY HERB) 2 MG LOZG Use as directed 1 lozenge in the mouth or throat every 2 (two) hours as needed (FOR COUGH).    Marland Kitchen. metoprolol succinate (TOPROL-XL) 25 MG 24 hr tablet Take 25 mg by mouth daily.    . ondansetron (ZOFRAN) 4 MG tablet Take 4 mg by mouth daily as needed for nausea or vomiting.     . pantoprazole (PROTONIX) 40 MG tablet Take 1 tablet (40 mg total) by mouth 2 (two) times daily before a meal. (Patient not taking: Reported on 07/15/2018)    . polyethylene glycol (MIRALAX / GLYCOLAX) packet Take 17 g by mouth daily.    . potassium chloride (K-DUR) 10 MEQ tablet Take 10 mEq by mouth daily.     . traZODone (DESYREL) 50 MG tablet Take 25 mg by mouth at bedtime.     . triamcinolone cream (KENALOG) 0.1 % Apply 1 application topically 3 (three) times daily as needed (skin conditions).       Discharge Medications: Please see discharge summary for a list of discharge medications.  Relevant Imaging  Results:  Relevant Lab Results:   Additional Information SSN:  [id number]  [name], Claudine M, [place]

## 2018-07-15 NOTE — H&P (Signed)
[facility]   PATIENT NAME: [name] RibasSophie [name]    MR#:  [id number]  DATE OF BIRTH:  [birth year]  DATE OF ADMISSION:  07/15/2018  PRIMARY CARE PHYSICIAN: Karie SchwalbeLetvak, Richard I, MD   REQUESTING/REFERRING PHYSICIAN:   CHIEF COMPLAINT:   Chief Complaint  Patient presents with  . Hyperglycemia  . Emesis    HISTORY OF PRESENT ILLNESS: [name] [name]  is a 77 y.o. female with a known history of carotid artery occlusion, CVA in the past, dementia, GERD, hyperlipidemia, hypertension presented to the emergency room for vomiting of blood.  Patient had a coffee-ground emesis.  Not a great historian.  No complaints of abdominal pain.  She was evaluated in the emergency room started on IV Protonix drip and hospitalist service was consulted.  Prior endoscopy in the past showed gastritis.  No history of any rectal bleed.  Gastroenterology service has been notified regarding the consult.  Patient is on aspirin at home.  PAST MEDICAL HISTORY:   Past Medical History:  Diagnosis Date  . Carotid artery occlusion 02/15/2009  . CVA (cerebral infarction)   . Dementia (HCC)   . Diabetes mellitus   . GERD (gastroesophageal reflux disease)   . Heart murmur   . HLD (hyperlipidemia)   . Hypertension   . Stroke [facility](HCC)     PAST SURGICAL HISTORY:  Past Surgical History:  Procedure Laterality Date  . APPENDECTOMY    . ESOPHAGOGASTRODUODENOSCOPY Left 10/17/2015   Procedure: ESOPHAGOGASTRODUODENOSCOPY (EGD);  Surgeon: Scot Junobert T Elliott, MD;  [facility];  Service: Endoscopy;  Laterality: Left;  . TONSILLECTOMY      SOCIAL HISTORY:  Social History   Tobacco Use  . Smoking status: Former Smoker    Types: Cigarettes    Last attempt to quit: 07/20/1985    Years since quitting: 33.0  . Smokeless tobacco: Never Used  Substance Use Topics  . Alcohol use: No    FAMILY HISTORY:  Family History  Problem Relation Age of Onset  . Diabetes Mother   .  Hypertension Mother   . Diabetes Father   . Hypertension Father   . Diabetes Sister   . Hypertension Sister     DRUG ALLERGIES:  Allergies  Allergen Reactions  . Fexofenadine Hcl Rash    REVIEW OF SYSTEMS:   CONSTITUTIONAL: No fever, fatigue or weakness.  EYES: No blurred or double vision.  EARS, NOSE, AND THROAT: No tinnitus or ear pain.  RESPIRATORY: No cough, shortness of breath, wheezing or hemoptysis.  CARDIOVASCULAR: No chest pain, orthopnea, edema.  GASTROINTESTINAL: No nausea,  diarrhea or abdominal pain.  Vomiting no blood present GENITOURINARY: No dysuria, hematuria.  ENDOCRINE: No polyuria, nocturia,  HEMATOLOGY: No anemia, easy bruising or bleeding SKIN: No rash or lesion. MUSCULOSKELETAL: No joint pain or arthritis.   NEUROLOGIC: No tingling, numbness, weakness.  PSYCHIATRY: No anxiety or depression.   MEDICATIONS AT HOME:  Prior to Admission medications   Medication Sig Start Date End Date Taking? Authorizing Provider  amLODipine (NORVASC) 10 MG tablet Take 0.5 tablets (5 mg total) by mouth daily. Patient taking differently: Take 10 mg by mouth daily.  05/31/15  Yes Gouru, Deanna ArtisAruna, MD  aspirin 81 MG chewable tablet Chew 81 mg by mouth daily.   Yes [provider]  Cholecalciferol (VITAMIN D3) 50000 units CAPS Take 1 capsule by mouth every 30 (thirty) days. GIVE ON THE 28TH OF EVERY MONTH   Yes [provider]  citalopram (CELEXA) 10 MG tablet  Take 5 mg by mouth daily.    Yes [provider]  furosemide (LASIX) 40 MG tablet Take 0.5 tablets (20 mg total) by mouth daily. 05/29/15  Yes Gouru, Deanna Artis, MD  gabapentin (NEURONTIN) 300 MG capsule Take 300 mg by mouth every evening.    Yes [provider]  levothyroxine (SYNTHROID, LEVOTHROID) 88 MCG tablet Take 88 mcg by mouth daily before breakfast.   Yes [provider]  lovastatin (MEVACOR) 20 MG tablet Take 20 mg by mouth every evening.    Yes [provider]   memantine (NAMENDA) 10 MG tablet Take 10 mg by mouth 2 (two) times daily.    Yes [provider]  metoprolol succinate (TOPROL-XL) 25 MG 24 hr tablet Take 25 mg by mouth daily.   Yes [provider]  pantoprazole (PROTONIX) 20 MG tablet Take 20 mg by mouth daily. Take one tablet by mouth before breakfast for GERD   Yes [provider]  polyethylene glycol (MIRALAX / GLYCOLAX) packet Take 17 g by mouth daily.   Yes [provider]  potassium chloride (K-DUR) 10 MEQ tablet Take 10 mEq by mouth daily.    Yes [provider]  senna (SENOKOT) 8.6 MG tablet Take 1 tablet by mouth daily.   Yes [provider]  traZODone (DESYREL) 50 MG tablet Take 25 mg by mouth at bedtime.    Yes [provider]  acetaminophen (TYLENOL) 325 MG tablet Take 2 tablets (650 mg total) by mouth every 6 (six) hours as needed for mild pain (or Fever >/= 101). Patient not taking: Reported on 07/15/2018 05/29/15   Ramonita Lab, MD  barrier cream (NON-SPECIFIED) CREA Apply 1 application topically 2 (two) times daily as needed (skin protection on buttocks).    [provider]  guaiFENesin (ROBITUSSIN) 100 MG/5ML liquid Take 200 mg by mouth every 4 (four) hours as needed for cough.    [provider]  hydroxypropyl methylcellulose / hypromellose (ISOPTO TEARS / GONIOVISC) 2.5 % ophthalmic solution Place 1 drop into both eyes 3 (three) times daily as needed for dry eyes.    [provider]  insulin glargine (LANTUS) 100 UNIT/ML injection Inject 0.27 mLs (27 Units total) into the skin at bedtime. Patient taking differently: Inject 35 Units into the skin every morning.  05/31/15   Ramonita Lab, MD  loratadine (CLARITIN) 10 MG tablet Take 10 mg by mouth daily as needed for allergies.    [provider]  magnesium hydroxide (MILK OF MAGNESIA) 400 MG/5ML suspension Take 30 mLs by mouth 2 (two) times daily as needed for mild constipation or  moderate constipation.    [provider]  Menthol (RICOLA CHERRY HONEY HERB) 2 MG LOZG Use as directed 1 lozenge in the mouth or throat every 2 (two) hours as needed (FOR COUGH).    [provider]  ondansetron (ZOFRAN) 4 MG tablet Take 4 mg by mouth daily as needed for nausea or vomiting.     [provider]  pantoprazole (PROTONIX) 40 MG tablet Take 1 tablet (40 mg total) by mouth 2 (two) times daily before a meal. Patient not taking: Reported on 07/15/2018 03/20/18   Shaune Pollack, MD  triamcinolone cream (KENALOG) 0.1 % Apply 1 application topically 3 (three) times daily as needed (skin conditions).    [provider]      PHYSICAL EXAMINATION:   VITAL SIGNS: Blood pressure (!) 169/105, pulse (!) 107, temperature 98.5 F (36.9 C), temperature source Oral, resp. rate Marland Kitchen)  22, height 5\' 2"  (1.575 m), weight 69.6 kg, SpO2 99 %.  GENERAL:  77 y.o.-year-old patient lying in the bed with no acute distress.  EYES: Pupils equal, round, reactive to light and accommodation. No scleral icterus. Extraocular muscles intact.  HEENT: Head atraumatic, normocephalic. Oropharynx and nasopharynx clear.  NECK:  Supple, no jugular venous distention. No thyroid enlargement, no tenderness.  LUNGS: Normal breath sounds bilaterally, no wheezing, rales,rhonchi or crepitation. No use of accessory muscles of respiration.  CARDIOVASCULAR: S1, S2 normal. No murmurs, rubs, or gallops.  ABDOMEN: Soft, nontender, nondistended. Bowel sounds present. No organomegaly or mass.  EXTREMITIES: No pedal edema, cyanosis, or clubbing.  NEUROLOGIC: Cranial nerves II through XII are intact. Muscle strength 5/5 in all extremities. Sensation intact. Gait not checked.  PSYCHIATRIC: The patient is alert and oriented x 3.  SKIN: No obvious rash, lesion, or ulcer.   LABORATORY PANEL:   CBC Recent Labs  Lab 07/15/18 1050  WBC 8.7  HGB 12.5  HCT 41.4  PLT 296  MCV 77.2*  MCH 23.3*  MCHC 30.2   RDW 18.1*   ------------------------------------------------------------------------------------------------------------------  Chemistries  Recent Labs  Lab 07/15/18 1050  NA 140  K 5.2*  CL 105  CO2 24  GLUCOSE 395*  BUN 16  CREATININE 1.20*  CALCIUM 9.0   ------------------------------------------------------------------------------------------------------------------ estimated creatinine clearance is 35.3 mL/min (A) (by C-G formula based on SCr of 1.2 mg/dL (H)). ------------------------------------------------------------------------------------------------------------------ No results for input(s): TSH, T4TOTAL, T3FREE, THYROIDAB in the last 72 hours.  Invalid input(s): FREET3   Coagulation profile Recent Labs  Lab 07/15/18 1156  INR 1.11   ------------------------------------------------------------------------------------------------------------------- No results for input(s): DDIMER in the last 72 hours. -------------------------------------------------------------------------------------------------------------------  Cardiac Enzymes Recent Labs  Lab 07/15/18 1050  TROPONINI <0.03   ------------------------------------------------------------------------------------------------------------------ Invalid input(s): POCBNP  ---------------------------------------------------------------------------------------------------------------  Urinalysis    Component Value Date/Time   COLORURINE YELLOW (A) 03/18/2018 0840   APPEARANCEUR HAZY (A) 03/18/2018 0840   APPEARANCEUR Clear 11/16/2013 2342   LABSPEC 1.024 03/18/2018 0840   LABSPEC 1.019 11/16/2013 2342   PHURINE 5.0 03/18/2018 0840   GLUCOSEU >=500 (A) 03/18/2018 0840   GLUCOSEU 150 mg/dL 16/04/9603 5409   HGBUR SMALL (A) 03/18/2018 0840   BILIRUBINUR NEGATIVE 03/18/2018 0840   BILIRUBINUR Negative 11/16/2013 2342   KETONESUR 80 (A) 03/18/2018 0840   PROTEINUR 100 (A) 03/18/2018 0840    UROBILINOGEN 0.2 09/22/2011 1412   NITRITE NEGATIVE 03/18/2018 0840   LEUKOCYTESUR NEGATIVE 03/18/2018 0840   LEUKOCYTESUR Negative 11/16/2013 2342     RADIOLOGY: Dg Abdomen Acute W/chest  Result Date: 07/15/2018 CLINICAL DATA:  Vomiting and GI bleed. EXAM: DG ABDOMEN ACUTE W/ 1V CHEST COMPARISON:  03/18/2018 chest radiograph and prior studies FINDINGS: This is a low volume film. UPPER limits normal heart size and pulmonary vascular congestion noted. No pneumothorax or definite pleural effusion. Mild LEFT basilar atelectasis/scarring noted. Bowel gas pattern is unremarkable. There is no evidence of bowel obstruction or pneumoperitoneum. No suspicious calcifications are identified. No acute bony abnormalities are noted. IMPRESSION: 1. No acute abnormality within the abdomen. Unremarkable bowel gas pattern. 2. UPPER limits normal heart size, pulmonary vascular congestion and mild LEFT basilar atelectasis/scarring. Electronically Signed   By: Harmon Pier M.D.   On: 07/15/2018 11:57    EKG: Orders placed or performed during the hospital encounter of 07/15/18  . EKG 12-Lead  . EKG 12-Lead    IMPRESSION AND PLAN:  77 year old female patient with history of diabetes mellitus type 2, GERD, hyperlipidemia, CVA, dementia, carotid artery  occlusion presented to the emergency room for vomiting of blood  -Acute gastrointestinal bleeding Admit patient to medical floor IV Protonix drip Serial hemoglobin hematocrit monitoring Gastroenterology consultation PRBC transfusion IV if hemoglobin drops less than 7 Keep patient n.p.o.  -Diabetes mellitus uncontrolled Sliding scale coverage with insulin Currently patient n.p.o.  -Dementia Will resume Namenda and Aricept once GI bleed resolved  -History of CVA Aspirin on hold secondary to GI bleed  -GERD On IV Protonix drip  -DVT prophylaxis sequential compression device to lower extremities All the records are reviewed and case discussed with ED  provider. Management plans discussed with the patient, family and they are in agreement.  CODE STATUS:Full code Code Status History    Date Active Date Inactive Code Status Order ID Comments User Context   03/18/2018 1145 03/20/2018 1722 Full Code [id number]  Bertrum SolSalary, Montell D, MD ED   10/16/2015 0916 10/17/2015 1938 Full Code [id number]  Hower, Cletis Athensavid K, MD ED   05/24/2015 1109 05/31/2015 1833 Full Code [id number]  Gale JourneyWalsh, Catherine P, MD Inpatient   09/23/2011 0031 09/28/2011 1933 Full Code [id number]  Garnett-Mellinger, Vennie HomansEdna Minnie, RN Inpatient    Advance Directive Documentation     Most Recent Value  Type of Advance Directive  Healthcare Power of Attorney  Pre-existing out of facility DNR order (yellow form or pink MOST form)  -  "MOST" Form in Place?  -       TOTAL TIME TAKING CARE OF THIS PATIENT: 53 minutes.    Ihor AustinPavan  M.D on 07/15/2018 at 1:14 PM  Between 7am to 6pm - Pager - [phone]  After 6pm go to [email or web address] - password [email or web address] [facility]  [facility]  Office  [phone]  CC: Primary care physician; Karie SchwalbeLetvak, Richard I, MD

## 2018-07-15 NOTE — ED Notes (Signed)
Report given to [facility] RN receiving nurse. meds to go with patient.

## 2018-07-15 NOTE — ED Provider Notes (Signed)
[facility] Emergency Department Provider Note  ____________________________________________  Time seen: Approximately 11:10 AM  I have reviewed the triage vital signs and the nursing notes.   HISTORY  Chief Complaint Hyperglycemia and Emesis   HPI [name] [name] is a 77 y.o. female with a history of dementia, diabetes, GERD/gastritis, upper GI bleed, hyperlipidemia, hypertension who presents for evaluation of vomiting and elevated blood glucose.  Patient arrives via EMS from SNF for vomiting and elevated BG since this am. Patient unable to provide any history due to dementia.  Currently at baseline.  Patient actively vomiting coffee-ground emesis.  Initial blood glucose of 360.  Patient denies chest pain or shortness of breath, abdominal pain.  She is unable to answer any other questions.   Past Medical History:  Diagnosis Date  . Carotid artery occlusion 02/15/2009  . CVA (cerebral infarction)   . Dementia   . Diabetes mellitus   . GERD (gastroesophageal reflux disease)   . Heart murmur   . HLD (hyperlipidemia)   . Hypertension   . Stroke [facility](HCC)     Patient Active Problem List   Diagnosis Date Noted  . DKA (diabetic ketoacidoses) (HCC) 03/18/2018  . Upper GI bleed 10/16/2015  . Intractable nausea and vomiting 10/16/2015  . Hypertensive urgency 10/16/2015  . Alzheimer's disease (HCC) 05/30/2015  . Altered mental status 05/24/2015  . Occlusion and stenosis of carotid artery without mention of cerebral infarction 10/08/2011  . CKD (chronic kidney disease) 09/25/2011  . H/O: CVA (cardiovascular accident) 09/22/2011  . Diabetes mellitus, type 2 (HCC) 09/22/2011    Past Surgical History:  Procedure Laterality Date  . APPENDECTOMY    . ESOPHAGOGASTRODUODENOSCOPY Left 10/17/2015   Procedure: ESOPHAGOGASTRODUODENOSCOPY (EGD);  Surgeon: Scot Junobert T Elliott, MD;  [facility];  Service: Endoscopy;  Laterality: Left;  . TONSILLECTOMY       Prior to Admission medications   Medication Sig Start Date End Date Taking? Authorizing Provider  acetaminophen (TYLENOL) 325 MG tablet Take 2 tablets (650 mg total) by mouth every 6 (six) hours as needed for mild pain (or Fever >/= 101). 05/29/15   Gouru, Deanna ArtisAruna, MD  amLODipine (NORVASC) 10 MG tablet Take 0.5 tablets (5 mg total) by mouth daily. 05/31/15   Ramonita LabGouru, Aruna, MD  aspirin 81 MG chewable tablet Chew 81 mg by mouth daily.    [provider]  barrier cream (NON-SPECIFIED) CREA Apply 1 application topically 2 (two) times daily as needed (skin protection on buttocks).    [provider]  Cholecalciferol (VITAMIN D3) 50000 units CAPS Take 1 capsule by mouth every 30 (thirty) days. GIVE ON THE 28TH OF EVERY MONTH    [provider]  citalopram (CELEXA) 10 MG tablet Take 5 mg by mouth daily.     [provider]  furosemide (LASIX) 40 MG tablet Take 0.5 tablets (20 mg total) by mouth daily. 05/29/15   Ramonita LabGouru, Aruna, MD  gabapentin (NEURONTIN) 300 MG capsule Take 300 mg by mouth every evening.     [provider]  guaiFENesin (ROBITUSSIN) 100 MG/5ML liquid Take 200 mg by mouth every 4 (four) hours as needed for cough.    [provider]  hydroxypropyl methylcellulose / hypromellose (ISOPTO TEARS / GONIOVISC) 2.5 % ophthalmic solution Place 1 drop into both eyes 3 (three) times daily as needed for dry eyes.    [provider]  insulin glargine (LANTUS) 100 UNIT/ML injection Inject 0.27 mLs (27 Units total) into the skin at bedtime. Patient  taking differently: Inject 34 Units into the skin every morning.  05/31/15   Ramonita Lab, MD  levothyroxine (SYNTHROID, LEVOTHROID) 88 MCG tablet Take 88 mcg by mouth daily before breakfast.    [provider]  loratadine (CLARITIN) 10 MG tablet Take 10 mg by mouth daily as needed for allergies.    [provider]  lovastatin (MEVACOR) 20 MG tablet Take 20 mg by mouth every evening.      [provider]  magnesium hydroxide (MILK OF MAGNESIA) 400 MG/5ML suspension Take 30 mLs by mouth 2 (two) times daily as needed for mild constipation or moderate constipation.    [provider]  memantine (NAMENDA) 5 MG tablet Take 5 mg by mouth 2 (two) times daily.    [provider]  Menthol (RICOLA CHERRY HONEY HERB) 2 MG LOZG Use as directed 1 lozenge in the mouth or throat every 2 (two) hours as needed (FOR COUGH).    [provider]  metoprolol succinate (TOPROL-XL) 25 MG 24 hr tablet Take 25 mg by mouth daily.    [provider]  ondansetron (ZOFRAN) 4 MG tablet Take 4 mg by mouth daily as needed for nausea or vomiting.     [provider]  pantoprazole (PROTONIX) 40 MG tablet Take 1 tablet (40 mg total) by mouth 2 (two) times daily before a meal. 03/20/18   Shaune Pollack, MD  polyethylene glycol [facility] Ethelene Hal) packet Take 17 g by mouth daily.    [provider]  potassium chloride (K-DUR) 10 MEQ tablet Take 10 mEq by mouth daily.     [provider]  traZODone (DESYREL) 50 MG tablet Take 25 mg by mouth at bedtime.     [provider]  triamcinolone cream (KENALOG) 0.1 % Apply 1 application topically 3 (three) times daily as needed (skin conditions).    [provider]    Allergies Fexofenadine hcl  Family History  Problem Relation Age of Onset  . Diabetes Mother   . Hypertension Mother   . Diabetes Father   . Hypertension Father   . Diabetes Sister   . Hypertension Sister     Social History Social History   Tobacco Use  . Smoking status: Former Smoker    Types: Cigarettes    Last attempt to quit: 07/20/1985    Years since quitting: 33.0  . Smokeless tobacco: Never Used  Substance Use Topics  . Alcohol use: No  . Drug use: No    Review of Systems  Constitutional: Negative for fever. Cardiovascular: Negative for chest pain. Respiratory: Negative for shortness of  breath. Gastrointestinal: Negative for abdominal pain. + coffee ground emesis  ____________________________________________   PHYSICAL EXAM:  VITAL SIGNS: ED Triage Vitals  Enc Vitals Group     BP 07/15/18 1041 (!) 169/105     Pulse Rate 07/15/18 1041 (!) 107     Resp 07/15/18 1041 (!) 22     Temp 07/15/18 1041 98.5 F (36.9 C)     Temp Source 07/15/18 1041 Oral     SpO2 07/15/18 1041 99 %     Weight 07/15/18 1038 153 lb 8 oz (69.6 kg)     Height 07/15/18 1038 5\' 2"  (1.575 m)     Head Circumference --      Peak Flow --      Pain Score 07/15/18 1038 0     Pain Loc --      Pain Edu? --      Excl.  in GC? --     Constitutional: Alert and oriented to self, actively vomiting coffee grounds.  HEENT:      Head: Normocephalic and atraumatic.         Eyes: Conjunctivae are normal. Sclera is non-icteric.       Mouth/Throat: Mucous membranes are moist.       Neck: Supple with no signs of meningismus. Cardiovascular: Tachycardic with regular rhythm. Respiratory: Tachypneic, lungs are clear to auscultation with no crackles or wheezing, no hypoxia  gastrointestinal: Soft, non tender, and non distended with positive bowel sounds. No rebound or guarding. Musculoskeletal: Nontender with normal range of motion in all extremities. No edema, cyanosis, or erythema of extremities. Neurologic: Normal speech and language. Face is symmetric. Moving all extremities. No gross focal neurologic deficits are appreciated. Skin: Skin is warm, dry and intact. No rash noted. Psychiatric: Mood and affect are normal. Speech and behavior are normal.  ____________________________________________   LABS (all labs ordered are listed, but only abnormal results are displayed)  Labs Reviewed  BASIC METABOLIC PANEL - Abnormal; Notable for the following components:      Result Value   Potassium 5.2 (*)    Glucose, Bld 395 (*)    Creatinine, Ser 1.20 (*)    GFR calc non Af Amer 43 (*)    GFR calc Af Amer 50  (*)    All other components within normal limits  CBC - Abnormal; Notable for the following components:   RBC 5.36 (*)    MCV 77.2 (*)    MCH 23.3 (*)    RDW 18.1 (*)    All other components within normal limits  GLUCOSE, CAPILLARY - Abnormal; Notable for the following components:   Glucose-Capillary 360 (*)    All other components within normal limits  BLOOD GAS, VENOUS  URINALYSIS, COMPLETE (UACMP) WITH MICROSCOPIC  LIPASE, BLOOD  TROPONIN I  PROTIME-INR  APTT  CBG MONITORING, ED  TYPE AND SCREEN   ____________________________________________  EKG  ED ECG REPORT I, Nita Sicklearolina Amilio Zehnder, the attending physician, personally viewed and interpreted this ECG.  Sinus tachycardia, rate of 110, normal intervals, normal axis, diffuse ST depressions with no ST elevations. Unchanged from prior ____________________________________________  RADIOLOGY  I have personally reviewed the images performed during this visit and I agree with the Radiologist's read.   Interpretation by Radiologist:  No results found.   ____________________________________________   PROCEDURES  Procedure(s) performed: None Procedures Critical Care performed:  None ____________________________________________   INITIAL IMPRESSION / ASSESSMENT AND PLAN / ED COURSE   77 y.o. female with a history of dementia, diabetes, GERD/gastritis, upper GI bleed, hyperlipidemia, hypertension who presents for evaluation of vomiting and elevated blood glucose.  Patient actively vomiting coffee-ground emesis, tachycardic and tachypneic but normotensive with no hypoxia.  Abdomen is soft with no tenderness.  Patient with prior admission in August with similar complaints.  No endoscopy performed at that time.  Last endoscopy was in 2017 showing gastritis.  Patient is not on blood thinners.  EKG with diffuse ST depressions however that is changed from prior.  Creatinine is at baseline.  Glucose elevated at 395 with no evidence  of DKA with a normal anion gap of 11, normal pH of 7.37.  Potassium is slightly elevated but that is hemolyzed therefore probably normal.  Hemoglobin is stable at 12.5.  Will give Zofran, Protonix bolus and drip, IV fluids, and admit to the hospitalist service for upper GI bleed.      As part of  my medical decision making, I reviewed the following data within the electronic MEDICAL RECORD NUMBER Nursing notes reviewed and incorporated, Labs reviewed , EKG interpreted , Old EKG reviewed, Old chart reviewed, Radiograph reviewed , Discussed with admitting physician , Notes from prior ED visits and [place] Controlled Substance Database    Pertinent labs & imaging results that were available during my care of the patient were reviewed by me and considered in my medical decision making (see chart for details).    ____________________________________________   FINAL CLINICAL IMPRESSION(S) / ED DIAGNOSES  Final diagnoses:  Gastrointestinal hemorrhage with hematemesis  Hyperglycemia      NEW MEDICATIONS STARTED DURING THIS VISIT:  ED Discharge Orders    None       Note:  This document was prepared using Dragon voice recognition software and may include unintentional dictation errors.    Nita Sickle, MD 07/15/18 [phone]

## 2018-07-16 LAB — BASIC METABOLIC PANEL
ANION GAP: 12 (ref 5–15)
BUN: 18 mg/dL (ref 8–23)
CO2: 19 mmol/L — ABNORMAL LOW (ref 22–32)
Calcium: 8.8 mg/dL — ABNORMAL LOW (ref 8.9–10.3)
Chloride: 116 mmol/L — ABNORMAL HIGH (ref 98–111)
Creatinine, Ser: 1.4 mg/dL — ABNORMAL HIGH (ref 0.44–1.00)
GFR calc Af Amer: 42 mL/min — ABNORMAL LOW (ref >60)
GFR calc non Af Amer: 36 mL/min — ABNORMAL LOW (ref >60)
GLUCOSE: 372 mg/dL — AB (ref 70–99)
Potassium: 4.2 mmol/L (ref 3.5–5.1)
Sodium: 147 mmol/L — ABNORMAL HIGH (ref 135–145)

## 2018-07-16 LAB — HEMOGLOBIN AND HEMATOCRIT, BLOOD
HCT: 39.7 % (ref 36.0–46.0)
HCT: 38.7 % (ref 36.0–46.0)
Hemoglobin: 11.7 g/dL — ABNORMAL LOW (ref 12.0–15.0)
Hemoglobin: 11.5 g/dL — ABNORMAL LOW (ref 12.0–15.0)

## 2018-07-16 LAB — GLUCOSE, RANDOM: Glucose, Bld: 461 mg/dL — ABNORMAL HIGH (ref 70–99)

## 2018-07-16 LAB — GLUCOSE, CAPILLARY
GLUCOSE-CAPILLARY: 403 mg/dL — AB (ref 70–99)
Glucose-Capillary: 93 mg/dL (ref 70–99)
Glucose-Capillary: 209 mg/dL — ABNORMAL HIGH (ref 70–99)

## 2018-07-16 LAB — HEMOGLOBIN A1C
Hgb A1c MFr Bld: 10.7 % — ABNORMAL HIGH (ref 4.8–5.6)
Mean Plasma Glucose: 260.39 mg/dL

## 2018-07-16 MED ORDER — METOPROLOL SUCCINATE ER 25 MG PO TB24
25.0000 mg | ORAL_TABLET | Freq: Every day | ORAL | Status: DC
  Administered 2018-07-16 – 2018-07-17 (×2): 25 mg via ORAL
  Filled 2018-07-16 (×2): qty 1

## 2018-07-16 MED ORDER — INSULIN ASPART 100 UNIT/ML ~~LOC~~ SOLN
0.0000 [IU] | Freq: Three times a day (TID) | SUBCUTANEOUS | Status: DC
  Administered 2018-07-17: 1 [IU] via SUBCUTANEOUS
  Filled 2018-07-16: qty 1

## 2018-07-16 MED ORDER — SENNA 8.6 MG PO TABS
1.0000 | ORAL_TABLET | Freq: Every day | ORAL | Status: DC
  Administered 2018-07-16 – 2018-07-17 (×2): 8.6 mg via ORAL
  Filled 2018-07-16 (×2): qty 1

## 2018-07-16 MED ORDER — INSULIN GLARGINE 100 UNIT/ML ~~LOC~~ SOLN
25.0000 [IU] | Freq: Every day | SUBCUTANEOUS | Status: DC
  Administered 2018-07-16 – 2018-07-17 (×2): 25 [IU] via SUBCUTANEOUS
  Filled 2018-07-16 (×4): qty 0.25

## 2018-07-16 MED ORDER — POLYETHYLENE GLYCOL 3350 17 G PO PACK
17.0000 g | PACK | Freq: Every day | ORAL | Status: DC
  Administered 2018-07-16 – 2018-07-17 (×2): 17 g via ORAL
  Filled 2018-07-16 (×2): qty 1

## 2018-07-16 MED ORDER — LEVOTHYROXINE SODIUM 88 MCG PO TABS
88.0000 ug | ORAL_TABLET | Freq: Every day | ORAL | Status: DC
  Administered 2018-07-17: 88 ug via ORAL
  Filled 2018-07-16: qty 1

## 2018-07-16 MED ORDER — INSULIN ASPART 100 UNIT/ML ~~LOC~~ SOLN
0.0000 [IU] | Freq: Every day | SUBCUTANEOUS | Status: DC
  Administered 2018-07-16: 2 [IU] via SUBCUTANEOUS
  Filled 2018-07-16: qty 1

## 2018-07-16 MED ORDER — PANTOPRAZOLE SODIUM 40 MG PO TBEC
40.0000 mg | DELAYED_RELEASE_TABLET | Freq: Two times a day (BID) | ORAL | Status: DC
  Administered 2018-07-16 – 2018-07-17 (×2): 40 mg via ORAL
  Filled 2018-07-16 (×2): qty 1

## 2018-07-16 MED ORDER — DIPHENHYDRAMINE HCL 50 MG/ML IJ SOLN
12.5000 mg | Freq: Once | INTRAMUSCULAR | Status: AC
  Administered 2018-07-16: 12.5 mg via INTRAVENOUS
  Filled 2018-07-16: qty 1

## 2018-07-16 MED ORDER — MEMANTINE HCL 5 MG PO TABS
10.0000 mg | ORAL_TABLET | Freq: Two times a day (BID) | ORAL | Status: DC
  Administered 2018-07-16 – 2018-07-17 (×3): 10 mg via ORAL
  Filled 2018-07-16 (×3): qty 2

## 2018-07-16 MED ORDER — GABAPENTIN 300 MG PO CAPS
300.0000 mg | ORAL_CAPSULE | Freq: Every evening | ORAL | Status: DC
  Administered 2018-07-16: 300 mg via ORAL
  Filled 2018-07-16: qty 1

## 2018-07-16 MED ORDER — CITALOPRAM HYDROBROMIDE 10 MG PO TABS
5.0000 mg | ORAL_TABLET | Freq: Every day | ORAL | Status: DC
  Administered 2018-07-16 – 2018-07-17 (×2): 5 mg via ORAL
  Filled 2018-07-16 (×3): qty 1

## 2018-07-16 MED ORDER — TRAZODONE HCL 50 MG PO TABS
25.0000 mg | ORAL_TABLET | Freq: Every day | ORAL | Status: DC
  Administered 2018-07-16: 25 mg via ORAL
  Filled 2018-07-16: qty 1

## 2018-07-16 MED ORDER — SODIUM CHLORIDE 0.45 % IV SOLN
INTRAVENOUS | Status: DC
  Administered 2018-07-16 – 2018-07-17 (×3): via INTRAVENOUS

## 2018-07-16 MED ORDER — INSULIN ASPART 100 UNIT/ML ~~LOC~~ SOLN
15.0000 [IU] | Freq: Once | SUBCUTANEOUS | Status: AC
  Administered 2018-07-16: 15 [IU] via SUBCUTANEOUS
  Filled 2018-07-16: qty 1

## 2018-07-16 NOTE — Progress Notes (Signed)
[facility] - [place] at [facility]   PATIENT NAME: [name] [name]    MR#:  [id number]  DATE OF BIRTH:  11-15-1940  SUBJECTIVE:  CHIEF COMPLAINT:   Chief Complaint  Patient presents with  . Hyperglycemia  . Emesis   Still has nausea and vomiting Afebrile  REVIEW OF SYSTEMS:    Review of Systems  Constitutional: Negative for chills and fever.  HENT: Negative for sore throat.   Eyes: Negative for blurred vision, double vision and pain.  Respiratory: Negative for cough, hemoptysis, shortness of breath and wheezing.   Cardiovascular: Negative for chest pain, palpitations, orthopnea and leg swelling.  Gastrointestinal: Positive for nausea and vomiting. Negative for abdominal pain, constipation, diarrhea and heartburn.  Genitourinary: Negative for dysuria and hematuria.  Musculoskeletal: Negative for back pain and joint pain.  Skin: Negative for rash.  Neurological: Negative for sensory change, speech change, focal weakness and headaches.  Endo/Heme/Allergies: Does not bruise/bleed easily.  Psychiatric/Behavioral: Negative for depression. The patient is not nervous/anxious.     DRUG ALLERGIES:   Allergies  Allergen Reactions  . Fexofenadine Hcl Rash    VITALS:  Blood pressure 132/67, pulse 91, temperature 99.5 F (37.5 C), temperature source Oral, resp. rate 16, height 5\' 2"  (1.575 m), weight 69.6 kg, SpO2 100 %.  PHYSICAL EXAMINATION:   Physical Exam  GENERAL:  77 y.o.-year-old patient lying in the bed with no acute distress.  EYES: Pupils equal, round, reactive to light and accommodation. No scleral icterus. Extraocular muscles intact.  HEENT: Head atraumatic, normocephalic. Oropharynx and nasopharynx clear.  NECK:  Supple, no jugular venous distention. No thyroid enlargement, no tenderness.  LUNGS: Normal breath sounds bilaterally, no wheezing, rales, rhonchi. No use of accessory muscles of respiration.  CARDIOVASCULAR: S1, S2 normal. No murmurs,  rubs, or gallops.  ABDOMEN: Soft, nontender, nondistended. Bowel sounds present. No organomegaly or mass.  EXTREMITIES: No cyanosis, clubbing or edema b/l.    NEUROLOGIC: Cranial nerves II through XII are intact. No focal Motor or sensory deficits b/l.   PSYCHIATRIC: The patient is alert and awake SKIN: No obvious rash, lesion, or ulcer.   LABORATORY PANEL:   CBC Recent Labs  Lab 07/15/18 1050  07/16/18 1100  WBC 8.7  --   --   HGB 12.5   < > 11.5*  HCT 41.4   < > 38.7  PLT 296  --   --    < > = values in this interval not displayed.   ------------------------------------------------------------------------------------------------------------------ Chemistries  Recent Labs  Lab 07/16/18 0509 07/16/18 1110  NA 147*  --   K 4.2  --   CL 116*  --   CO2 19*  --   GLUCOSE 372* 461*  BUN 18  --   CREATININE 1.40*  --   CALCIUM 8.8*  --    ------------------------------------------------------------------------------------------------------------------  Cardiac Enzymes Recent Labs  Lab 07/15/18 1050  TROPONINI <0.03   ------------------------------------------------------------------------------------------------------------------  RADIOLOGY:  Dg Abdomen Acute W/chest  Result Date: 07/15/2018 CLINICAL DATA:  Vomiting and GI bleed. EXAM: DG ABDOMEN ACUTE W/ 1V CHEST COMPARISON:  03/18/2018 chest radiograph and prior studies FINDINGS: This is a low volume film. UPPER limits normal heart size and pulmonary vascular congestion noted. No pneumothorax or definite pleural effusion. Mild LEFT basilar atelectasis/scarring noted. Bowel gas pattern is unremarkable. There is no evidence of bowel obstruction or pneumoperitoneum. No suspicious calcifications are identified. No acute bony abnormalities are noted. IMPRESSION: 1. No acute abnormality within the abdomen. Unremarkable bowel gas pattern.  2. UPPER limits normal heart size, pulmonary vascular congestion and mild LEFT basilar  atelectasis/scarring. Electronically Signed   By: Harmon PierJeffrey  Hu M.D.   On: 07/15/2018 11:57     ASSESSMENT AND PLAN:   77 year old female patient with history of diabetes mellitus type 2, GERD, hyperlipidemia, CVA, dementia, carotid artery occlusion presented to the emergency room for vomiting of blood  *Vomiting likely due to gastroparesis and hyperglycemia Zofran as needed No bleeding found.  Appreciate GI input.  Stop Protonix drip.  Oral Protonix.  -Diabetes mellitus uncontrolled Restart Lantus.  Sliding scale insulin.  -Dementia Will resume Namenda and Aricept  -History of CVA Aspirin on hold concern regarding  GI bleed  -GERD Start oral Protonix  -DVT prophylaxis sequential compression device to lower extremities  All the records are reviewed and case discussed with Care Management/Social Workerr. Management plans discussed with the patient, family and they are in agreement.  CODE STATUS: Full code  DVT Prophylaxis: SCDs  TOTAL TIME TAKING CARE OF THIS PATIENT: 35 minutes.   POSSIBLE D/C IN 1-2 DAYS, DEPENDING ON CLINICAL CONDITION.  Molinda BailiffSrikar R Mixtli Reno M.D on 07/16/2018 at 2:49 PM  Between 7am to 6pm - Pager - [phone]  After 6pm go to [email or web address] - password [email or web address]  [facility] [place] [facility]  Office  [phone]  CC: Primary care physician; Karie SchwalbeLetvak, Richard I, MD  Note: This dictation was prepared with Dragon dictation along with smaller phrase technology. Any transcriptional errors that result from this process are unintentional.

## 2018-07-16 NOTE — Progress Notes (Signed)
Called Dr. Sheryle Hailiamond regarding medication for sleep per patient request.  Appropriate orders were placed.  Arturo MortonClay, Cristian Grieves N  07/16/2018  3:17 AM

## 2018-07-16 NOTE — Progress Notes (Signed)
The patient hemoglobin has been stable and Dr. Tobi BastosAnna witnessed the patient's vomitus yesterday with no blood seen it and it was green and bilious.  No indication for any endoscopic procedure at this time.  The patient did have gastritis back in 2017 with a EGD by Dr. Mechele CollinElliott.  I will sign off.  Please call if any further GI concerns or questions.  We would like to thank you for the opportunity to participate in the care of [name] [name].

## 2018-07-17 LAB — GLUCOSE, CAPILLARY
Glucose-Capillary: 105 mg/dL — ABNORMAL HIGH (ref 70–99)
Glucose-Capillary: 141 mg/dL — ABNORMAL HIGH (ref 70–99)

## 2018-07-17 MED ORDER — METOCLOPRAMIDE HCL 5 MG/ML IJ SOLN
5.0000 mg | Freq: Three times a day (TID) | INTRAMUSCULAR | Status: DC
  Administered 2018-07-17: 5 mg via INTRAVENOUS
  Filled 2018-07-17: qty 2

## 2018-07-17 MED ORDER — METOCLOPRAMIDE HCL 5 MG PO TABS: 5.0000 mg | ORAL_TABLET | Freq: Three times a day (TID) | ORAL | 0 refills | Status: DC

## 2018-07-17 NOTE — Discharge Instructions (Signed)
Diabetic diet    Activity as tolerated

## 2018-07-17 NOTE — Clinical Social Work Note (Signed)
The patient will discharge to [facility] via non-emergent EMS. The CSW has left a HIPPA compliant voicemail for the patient's daughter advising of the imminent discharge. The facility is aware and in agreement. The CSW has delivered the discharge packet and has sent all needed documentation to the facility. The CSW is signing off. Please consult should needs arise.  Argentina PonderKaren Martha Elye Harmsen, MSW, Theresia MajorsLCSWA [phone]

## 2018-07-17 NOTE — Discharge Summary (Signed)
[facility] - [place] at [facility]   PATIENT NAME: [name] [name]    MR#:  [id number]  DATE OF BIRTH:  July 25, 1940  DATE OF ADMISSION:  07/15/2018 ADMITTING PHYSICIAN: Ihor AustinPavan Pyreddy, MD  DATE OF DISCHARGE: 07/17/2018  PRIMARY CARE PHYSICIAN: Karie SchwalbeLetvak, Richard I, MD   ADMISSION DIAGNOSIS:  Hyperglycemia [R73.9] Gastrointestinal hemorrhage with hematemesis [K92.0]  DISCHARGE DIAGNOSIS:  Active Problems:   GI bleed   SECONDARY DIAGNOSIS:   Past Medical History:  Diagnosis Date  . Carotid artery occlusion 02/15/2009  . CVA (cerebral infarction)   . Dementia (HCC)   . Diabetes mellitus   . GERD (gastroesophageal reflux disease)   . Heart murmur   . HLD (hyperlipidemia)   . Hypertension   . Stroke [facility](HCC)      ADMITTING HISTORY  HISTORY OF PRESENT ILLNESS: [name] RibasSophie Lasker  is a 77 y.o. female with a known history of carotid artery occlusion, CVA in the past, dementia, GERD, hyperlipidemia, hypertension presented to the emergency room for vomiting of blood.  Patient had a coffee-ground emesis.  Not a great historian.  No complaints of abdominal pain.  She was evaluated in the emergency room started on IV Protonix drip and hospitalist service was consulted.  Prior endoscopy in the past showed gastritis.  No history of any rectal bleed.  Gastroenterology service has been notified regarding the consult.  Patient is on aspirin at home.   HOSPITAL COURSE:   77 year old female patient with history of diabetes mellitus type 2, GERD, hyperlipidemia, CVA, dementia, carotid artery occlusion presented to the emergency room for vomiting of blood  *Vomiting likely due to gastroparesis and hyperglycemia Zofran as needed No bleeding found.  Appreciate GI input.  Initially on protonix drip and changed to PO  -Diabetes mellitus uncontrolled with hyperglycemia Restarted Lantus.  Sliding scale insulin. Well controlled  -Dementia On Namenda and Aricept  -History of  CVA Aspirin on hold concern regarding  GI bleed initially Can restart  -GERD Started oral Protonix  -DVT prophylaxis with SCDs in hospital  Tolerating diet and will be discharged home with scheduled reglan for 4 more days F/u with PCP within 1 week  If any further vomiting would initiate long term schedule reglan  CONSULTS OBTAINED:    DRUG ALLERGIES:   Allergies  Allergen Reactions  . Fexofenadine Hcl Rash    DISCHARGE MEDICATIONS:   Allergies as of 07/17/2018      Reactions   Fexofenadine Hcl Rash      Medication List    STOP taking these medications   acetaminophen 325 MG tablet Commonly known as:  TYLENOL     TAKE these medications   amLODipine 10 MG tablet Commonly known as:  NORVASC Take 0.5 tablets (5 mg total) by mouth daily. What changed:  how much to take   aspirin 81 MG chewable tablet Chew 81 mg by mouth daily.   barrier cream Crea Commonly known as:  non-specified Apply 1 application topically 2 (two) times daily as needed (skin protection on buttocks).   citalopram 10 MG tablet Commonly known as:  CELEXA Take 5 mg by mouth daily.   furosemide 40 MG tablet Commonly known as:  LASIX Take 0.5 tablets (20 mg total) by mouth daily.   gabapentin 300 MG capsule Commonly known as:  NEURONTIN Take 300 mg by mouth every evening.   guaiFENesin 100 MG/5ML liquid Commonly known as:  ROBITUSSIN Take 200 mg by mouth every 4 (four) hours as needed for cough.   hydroxypropyl  methylcellulose / hypromellose 2.5 % ophthalmic solution Commonly known as:  ISOPTO TEARS / GONIOVISC Place 1 drop into both eyes 3 (three) times daily as needed for dry eyes.   insulin glargine 100 UNIT/ML injection Commonly known as:  LANTUS Inject 0.27 mLs (27 Units total) into the skin at bedtime. What changed:    how much to take  when to take this   levothyroxine 88 MCG tablet Commonly known as:  SYNTHROID, LEVOTHROID Take 88 mcg by mouth daily before  breakfast.   loratadine 10 MG tablet Commonly known as:  CLARITIN Take 10 mg by mouth daily as needed for allergies.   lovastatin 20 MG tablet Commonly known as:  MEVACOR Take 20 mg by mouth every evening.   magnesium hydroxide 400 MG/5ML suspension Commonly known as:  MILK OF MAGNESIA Take 30 mLs by mouth 2 (two) times daily as needed for mild constipation or moderate constipation.   memantine 10 MG tablet Commonly known as:  NAMENDA Take 10 mg by mouth 2 (two) times daily.   metoCLOPramide 5 MG tablet Commonly known as:  REGLAN Take 1 tablet (5 mg total) by mouth 3 (three) times daily before meals for 4 days.   metoprolol succinate 25 MG 24 hr tablet Commonly known as:  TOPROL-XL Take 25 mg by mouth daily.   ondansetron 4 MG tablet Commonly known as:  ZOFRAN Take 4 mg by mouth daily as needed for nausea or vomiting.   pantoprazole 20 MG tablet Commonly known as:  PROTONIX Take 20 mg by mouth daily. Take one tablet by mouth before breakfast for GERD What changed:  Another medication with the same name was removed. Continue taking this medication, and follow the directions you see here.   polyethylene glycol packet Commonly known as:  MIRALAX / GLYCOLAX Take 17 g by mouth daily.   potassium chloride 10 MEQ tablet Commonly known as:  K-DUR Take 10 mEq by mouth daily.   RICOLA CHERRY HONEY HERB 2 MG Lozg Generic drug:  Menthol Use as directed 1 lozenge in the mouth or throat every 2 (two) hours as needed (FOR COUGH).   senna 8.6 MG tablet Commonly known as:  SENOKOT Take 1 tablet by mouth daily.   traZODone 50 MG tablet Commonly known as:  DESYREL Take 25 mg by mouth at bedtime.   triamcinolone cream 0.1 % Commonly known as:  KENALOG Apply 1 application topically 3 (three) times daily as needed (skin conditions).   Vitamin D3 1.25 MG (50000 UT) Caps Take 1 capsule by mouth every 30 (thirty) days. GIVE ON THE 28TH OF EVERY MONTH       Today   VITAL  SIGNS:  Blood pressure 130/84, pulse 82, temperature 98.4 F (36.9 C), temperature source Oral, resp. rate 18, height 5\' 2"  (1.575 m), weight 69.6 kg, SpO2 100 %.  I/O:    Intake/Output Summary (Last 24 hours) at 07/17/2018 0948 Last data filed at 07/17/2018 0453 Gross per 24 hour  Intake 1865.18 ml  Output 600 ml  Net 1265.18 ml    PHYSICAL EXAMINATION:  Physical Exam  GENERAL:  77 y.o.-year-old patient lying in the bed with no acute distress.  LUNGS: Normal breath sounds bilaterally, no wheezing, rales,rhonchi or crepitation. No use of accessory muscles of respiration.  CARDIOVASCULAR: S1, S2 normal. No murmurs, rubs, or gallops.  ABDOMEN: Soft, non-tender, non-distended. Bowel sounds present. No organomegaly or mass.  NEUROLOGIC: Moves all 4 extremities. PSYCHIATRIC: The patient is alert and awake SKIN: No obvious  rash, lesion, or ulcer.   DATA REVIEW:   CBC Recent Labs  Lab 07/15/18 1050  07/16/18 1100  WBC 8.7  --   --   HGB 12.5   < > 11.5*  HCT 41.4   < > 38.7  PLT 296  --   --    < > = values in this interval not displayed.    Chemistries  Recent Labs  Lab 07/16/18 0509 07/16/18 1110  NA 147*  --   K 4.2  --   CL 116*  --   CO2 19*  --   GLUCOSE 372* 461*  BUN 18  --   CREATININE 1.40*  --   CALCIUM 8.8*  --     Cardiac Enzymes Recent Labs  Lab 07/15/18 1050  TROPONINI <0.03    Microbiology Results  Results for orders placed or performed during the hospital encounter of 07/15/18  MRSA PCR Screening     Status: None   Collection Time: 07/15/18  4:39 PM  Result Value Ref Range Status   MRSA by PCR NEGATIVE NEGATIVE Final    Comment:        The GeneXpert MRSA Assay (FDA approved for NASAL specimens only), is one component of a comprehensive MRSA colonization surveillance program. It is not intended to diagnose MRSA infection nor to guide or monitor treatment for MRSA infections. Performed at [facility], [address], [place], [place] [address]     RADIOLOGY:  Dg Abdomen Acute W/chest  Result Date: 07/15/2018 CLINICAL DATA:  Vomiting and GI bleed. EXAM: DG ABDOMEN ACUTE W/ 1V CHEST COMPARISON:  03/18/2018 chest radiograph and prior studies FINDINGS: This is a low volume film. UPPER limits normal heart size and pulmonary vascular congestion noted. No pneumothorax or definite pleural effusion. Mild LEFT basilar atelectasis/scarring noted. Bowel gas pattern is unremarkable. There is no evidence of bowel obstruction or pneumoperitoneum. No suspicious calcifications are identified. No acute bony abnormalities are noted. IMPRESSION: 1. No acute abnormality within the abdomen. Unremarkable bowel gas pattern. 2. UPPER limits normal heart size, pulmonary vascular congestion and mild LEFT basilar atelectasis/scarring. Electronically Signed   By: Harmon Pier M.D.   On: 07/15/2018 11:57    Follow up with PCP in 1 week.  Management plans discussed with the patient, family and they are in agreement.  CODE STATUS:     Code Status Orders  (From admission, onward)         Start     Ordered   07/15/18 1523  Full code  Continuous     07/15/18 1522        Code Status History    Date Active Date Inactive Code Status Order ID Comments User Context   03/18/2018 1145 03/20/2018 1722 Full Code [id number]  Bertrum Sol, MD ED   10/16/2015 0916 10/17/2015 1938 Full Code [id number]  Hower, Cletis Athens, MD ED   05/24/2015 1109 05/31/2015 1833 Full Code [id number]  Gale Journey, MD Inpatient   09/23/2011 0031 09/28/2011 1933 Full Code [id number]  Garnett-Mellinger, Vennie Homans, RN Inpatient    Advance Directive Documentation     Most Recent Value  Type of Advance Directive  Healthcare Power of Attorney  Pre-existing out of facility DNR order (yellow form or pink MOST form)  -  "MOST" Form in Place?  -      TOTAL TIME TAKING CARE OF THIS PATIENT ON DAY OF DISCHARGE: more than 30 minutes.   Orie Fisherman M.D on  07/17/2018  at 9:48 AM  Between 7am to 6pm - Pager - [phone]  After 6pm go to [email or web address] - password [email or web address]  [facility] [place] [facility]  Office  [phone]  CC: Primary care physician; Karie Schwalbe, MD  Note: This dictation was prepared with Dragon dictation along with smaller phrase technology. Any transcriptional errors that result from this process are unintentional.

## 2018-07-17 NOTE — Progress Notes (Addendum)
Advance care planning  Purpose of Encounter Vomiting, code status discussion  Parties in Attendance Patient and daughter who is HCPOA [name] [name]  Patients Decisional capacity Alert awake with dementia Daughter makes all medical decisions with some input from patient  Discussed with patient's daughter regarding admission for vomiting and improvement.  All questions answered.  Patient has documented healthcare power of attorney.  No advance directives.  Encouraged daughter to have documentation in place.  She tells me that she has been thinking about this and would like patient to go back to [facility] at this point and daughter will discuss with patient regarding her wishes and finish documentation at the assisted living facility.  CODE STATUS is full code at this time.  Time spent - 20 minutes

## 2018-07-18 DIAGNOSIS — K922 Gastrointestinal hemorrhage, unspecified: Secondary | ICD-10-CM | POA: Diagnosis not present

## 2018-08-21 ENCOUNTER — Observation Stay
Admission: EM | Admit: 2018-08-21 | Discharge: 2018-08-23 | Disposition: A | Discharge status: Skilled Nursing Facility | Payer: Medicare Other | Attending: Gastroenterology | Admitting: Gastroenterology

## 2018-08-21 ENCOUNTER — Encounter: Payer: Self-pay | Admitting: Emergency Medicine

## 2018-08-21 ENCOUNTER — Other Ambulatory Visit: Payer: Self-pay

## 2018-08-21 DIAGNOSIS — K920 Hematemesis: Secondary | ICD-10-CM | POA: Diagnosis present

## 2018-08-21 DIAGNOSIS — E785 Hyperlipidemia, unspecified: Secondary | ICD-10-CM | POA: Insufficient documentation

## 2018-08-21 DIAGNOSIS — E1122 Type 2 diabetes mellitus with diabetic chronic kidney disease: Secondary | ICD-10-CM | POA: Insufficient documentation

## 2018-08-21 DIAGNOSIS — Z7982 Long term (current) use of aspirin: Secondary | ICD-10-CM | POA: Diagnosis not present

## 2018-08-21 DIAGNOSIS — F0280 Dementia in other diseases classified elsewhere without behavioral disturbance: Secondary | ICD-10-CM | POA: Diagnosis not present

## 2018-08-21 DIAGNOSIS — Z87891 Personal history of nicotine dependence: Secondary | ICD-10-CM | POA: Insufficient documentation

## 2018-08-21 DIAGNOSIS — Z8249 Family history of ischemic heart disease and other diseases of the circulatory system: Secondary | ICD-10-CM | POA: Diagnosis not present

## 2018-08-21 DIAGNOSIS — I129 Hypertensive chronic kidney disease with stage 1 through stage 4 chronic kidney disease, or unspecified chronic kidney disease: Secondary | ICD-10-CM | POA: Diagnosis not present

## 2018-08-21 DIAGNOSIS — N189 Chronic kidney disease, unspecified: Secondary | ICD-10-CM | POA: Diagnosis not present

## 2018-08-21 DIAGNOSIS — K317 Polyp of stomach and duodenum: Secondary | ICD-10-CM | POA: Insufficient documentation

## 2018-08-21 DIAGNOSIS — E1110 Type 2 diabetes mellitus with ketoacidosis without coma: Secondary | ICD-10-CM | POA: Insufficient documentation

## 2018-08-21 DIAGNOSIS — Z794 Long term (current) use of insulin: Secondary | ICD-10-CM | POA: Diagnosis not present

## 2018-08-21 DIAGNOSIS — K210 Gastro-esophageal reflux disease with esophagitis: Secondary | ICD-10-CM | POA: Diagnosis not present

## 2018-08-21 DIAGNOSIS — Z8673 Personal history of transient ischemic attack (TIA), and cerebral infarction without residual deficits: Secondary | ICD-10-CM | POA: Diagnosis not present

## 2018-08-21 DIAGNOSIS — K2210 Ulcer of esophagus without bleeding: Secondary | ICD-10-CM | POA: Insufficient documentation

## 2018-08-21 DIAGNOSIS — K2900 Acute gastritis without bleeding: Secondary | ICD-10-CM | POA: Diagnosis not present

## 2018-08-21 DIAGNOSIS — G309 Alzheimer's disease, unspecified: Secondary | ICD-10-CM | POA: Insufficient documentation

## 2018-08-21 DIAGNOSIS — Z888 Allergy status to other drugs, medicaments and biological substances status: Secondary | ICD-10-CM | POA: Diagnosis not present

## 2018-08-21 DIAGNOSIS — R011 Cardiac murmur, unspecified: Secondary | ICD-10-CM | POA: Insufficient documentation

## 2018-08-21 DIAGNOSIS — K449 Diaphragmatic hernia without obstruction or gangrene: Secondary | ICD-10-CM | POA: Insufficient documentation

## 2018-08-21 DIAGNOSIS — Z79899 Other long term (current) drug therapy: Secondary | ICD-10-CM | POA: Insufficient documentation

## 2018-08-21 DIAGNOSIS — Z833 Family history of diabetes mellitus: Secondary | ICD-10-CM | POA: Insufficient documentation

## 2018-08-21 DIAGNOSIS — K922 Gastrointestinal hemorrhage, unspecified: Secondary | ICD-10-CM | POA: Diagnosis present

## 2018-08-21 LAB — CBC WITH DIFFERENTIAL/PLATELET
ABS IMMATURE GRANULOCYTES: 0.04 10*3/uL (ref 0.00–0.07)
Basophils Absolute: 0 10*3/uL (ref 0.0–0.1)
Basophils Relative: 0 %
Eosinophils Absolute: 0.2 10*3/uL (ref 0.0–0.5)
Eosinophils Relative: 2 %
HCT: 36.2 % (ref 36.0–46.0)
Hemoglobin: 11.3 g/dL — ABNORMAL LOW (ref 12.0–15.0)
IMMATURE GRANULOCYTES: 0 %
Lymphocytes Relative: 14 %
Lymphs Abs: 1.6 10*3/uL (ref 0.7–4.0)
MCH: 24.3 pg — ABNORMAL LOW (ref 26.0–34.0)
MCHC: 31.2 g/dL (ref 30.0–36.0)
MCV: 77.8 fL — ABNORMAL LOW (ref 80.0–100.0)
Monocytes Absolute: 0.8 10*3/uL (ref 0.1–1.0)
Monocytes Relative: 7 %
NRBC: 0 % (ref 0.0–0.2)
Neutro Abs: 8.5 10*3/uL — ABNORMAL HIGH (ref 1.7–7.7)
Neutrophils Relative %: 77 %
Platelets: 242 10*3/uL (ref 150–400)
RBC: 4.65 MIL/uL (ref 3.87–5.11)
RDW: 18.1 % — ABNORMAL HIGH (ref 11.5–15.5)
WBC: 11.2 10*3/uL — AB (ref 4.0–10.5)

## 2018-08-21 LAB — COMPREHENSIVE METABOLIC PANEL
ALK PHOS: 92 U/L (ref 38–126)
ALT: 12 U/L (ref 0–44)
AST: 23 U/L (ref 15–41)
Albumin: 3.3 g/dL — ABNORMAL LOW (ref 3.5–5.0)
Anion gap: 6 (ref 5–15)
BUN: 15 mg/dL (ref 8–23)
CO2: 30 mmol/L (ref 22–32)
Calcium: 8.7 mg/dL — ABNORMAL LOW (ref 8.9–10.3)
Chloride: 104 mmol/L (ref 98–111)
Creatinine, Ser: 1.07 mg/dL — ABNORMAL HIGH (ref 0.44–1.00)
GFR calc Af Amer: 58 mL/min — ABNORMAL LOW (ref >60)
GFR calc non Af Amer: 50 mL/min — ABNORMAL LOW (ref >60)
Glucose, Bld: 208 mg/dL — ABNORMAL HIGH (ref 70–99)
Potassium: 4 mmol/L (ref 3.5–5.1)
Sodium: 140 mmol/L (ref 135–145)
Total Bilirubin: 0.5 mg/dL (ref 0.3–1.2)
Total Protein: 6.6 g/dL (ref 6.5–8.1)

## 2018-08-21 LAB — GLUCOSE, CAPILLARY
Glucose-Capillary: 161 mg/dL — ABNORMAL HIGH (ref 70–99)
Glucose-Capillary: 75 mg/dL (ref 70–99)
Glucose-Capillary: 87 mg/dL (ref 70–99)

## 2018-08-21 LAB — HEMOGLOBIN: Hemoglobin: 10.3 g/dL — ABNORMAL LOW (ref 12.0–15.0)

## 2018-08-21 LAB — MRSA PCR SCREENING: MRSA by PCR: NEGATIVE

## 2018-08-21 LAB — TYPE AND SCREEN
ABO/RH(D): O NEG
Antibody Screen: NEGATIVE

## 2018-08-21 LAB — LIPASE, BLOOD: Lipase: 21 U/L (ref 11–51)

## 2018-08-21 MED ORDER — PANTOPRAZOLE SODIUM 40 MG IV SOLR
40.0000 mg | Freq: Once | INTRAVENOUS | Status: AC
  Administered 2018-08-21: 40 mg via INTRAVENOUS
  Filled 2018-08-21: qty 40

## 2018-08-21 MED ORDER — ACETAMINOPHEN 650 MG RE SUPP: 650.0000 mg | Freq: Four times a day (QID) | RECTAL | Status: DC | PRN

## 2018-08-21 MED ORDER — QUETIAPINE FUMARATE 25 MG PO TABS
25.0000 mg | ORAL_TABLET | Freq: Every day | ORAL | Status: DC
  Administered 2018-08-21: 25 mg via ORAL
  Filled 2018-08-21 (×2): qty 1

## 2018-08-21 MED ORDER — HALOPERIDOL LACTATE 5 MG/ML IJ SOLN
5.0000 mg | Freq: Once | INTRAMUSCULAR | Status: AC
  Administered 2018-08-21: 5 mg via INTRAMUSCULAR
  Filled 2018-08-21: qty 1

## 2018-08-21 MED ORDER — DEXTROSE-NACL 5-0.9 % IV SOLN
INTRAVENOUS | Status: DC
  Administered 2018-08-21 – 2018-08-23 (×5): via INTRAVENOUS

## 2018-08-21 MED ORDER — PANTOPRAZOLE SODIUM 40 MG IV SOLR: 40.0000 mg | Freq: Two times a day (BID) | INTRAVENOUS | Status: DC

## 2018-08-21 MED ORDER — INSULIN ASPART 100 UNIT/ML ~~LOC~~ SOLN
0.0000 [IU] | Freq: Three times a day (TID) | SUBCUTANEOUS | Status: DC
  Administered 2018-08-21 – 2018-08-22 (×3): 2 [IU] via SUBCUTANEOUS
  Administered 2018-08-23: 08:00:00 5 [IU] via SUBCUTANEOUS
  Filled 2018-08-21 (×5): qty 1

## 2018-08-21 MED ORDER — ACETAMINOPHEN 325 MG PO TABS: 650.0000 mg | ORAL_TABLET | Freq: Four times a day (QID) | ORAL | Status: DC | PRN

## 2018-08-21 MED ORDER — QUETIAPINE FUMARATE 25 MG PO TABS: 12.5000 mg | ORAL_TABLET | Freq: Every day | ORAL | Status: DC

## 2018-08-21 MED ORDER — PANTOPRAZOLE SODIUM 40 MG IV SOLR
40.0000 mg | Freq: Two times a day (BID) | INTRAVENOUS | Status: DC
  Administered 2018-08-22 – 2018-08-23 (×3): 40 mg via INTRAVENOUS
  Filled 2018-08-21 (×3): qty 40

## 2018-08-21 MED ORDER — SODIUM CHLORIDE 0.9 % IV SOLN
INTRAVENOUS | Status: DC
  Administered 2018-08-21: 16:00:00 via INTRAVENOUS

## 2018-08-21 NOTE — ED Triage Notes (Signed)
Pt had 2 episodes of vomiting. Hx of gi bleed and vomit was dark so sent in to be checked out

## 2018-08-21 NOTE — Progress Notes (Signed)
Pastoral Care Visit    08/21/18 1900  Clinical Encounter Type  Visited With Patient;Patient not available  Visit Type Initial  Referral From Family;Nurse  Consult/Referral To Chaplain  Recommendations Visit often  Spiritual Encounters  Spiritual Needs Emotional   Pt was asleep when I went to visit.  Per RN, pt has dementia but enjoys visits (this is what the pt's daughter communicated). When visiting please approach gently, identify yourself, and the pt will receive you with a gentle demeanor.    Milinda Antis, [address]

## 2018-08-21 NOTE — ED Notes (Addendum)
Pt has been screaming, erratic, trying to hit people. States she wants to leave. Does not understand she is being admitted or why. Refuses to take seroquel. protonix iv given

## 2018-08-21 NOTE — ED Notes (Signed)
Pt pulled off her monitor equipment. Dr is aware.

## 2018-08-21 NOTE — ED Provider Notes (Signed)
[facility] Emergency Department Provider Note    First MD Initiated Contact with Patient 08/21/18 1223     (approximate)  I have reviewed the triage vital signs and the nursing notes.   HISTORY  Chief Complaint Emesis    HPI [name] [name] is a 78 y.o. female below listed past medical history presents the ER via EMS from [facility] for for evaluation of dark vomit.  Patient does have a history of GI bleeds.  Unable to provide any additional history due to her baseline dementia.  She states she is not having any pain and is "ready to go home."  Is unable to provide any additional details however on review of medical record she has been admitted to the hospital for several similar presentations.  He has refused endoscopic evaluation in the past.   Past Medical History:  Diagnosis Date  . Carotid artery occlusion 02/15/2009  . CVA (cerebral infarction)   . Dementia (HCC)   . Diabetes mellitus   . GERD (gastroesophageal reflux disease)   . Heart murmur   . HLD (hyperlipidemia)   . Hypertension   . Stroke [facility](HCC)    Family History  Problem Relation Age of Onset  . Diabetes Mother   . Hypertension Mother   . Diabetes Father   . Hypertension Father   . Diabetes Sister   . Hypertension Sister    Past Surgical History:  Procedure Laterality Date  . APPENDECTOMY    . ESOPHAGOGASTRODUODENOSCOPY Left 10/17/2015   Procedure: ESOPHAGOGASTRODUODENOSCOPY (EGD);  Surgeon: Scot Junobert T Elliott, MD;  Location: [facility] ENDOSCOPY;  Service: Endoscopy;  Laterality: Left;  . TONSILLECTOMY     Patient Active Problem List   Diagnosis Date Noted  . GI bleed 07/15/2018  . DKA (diabetic ketoacidoses) (HCC) 03/18/2018  . Upper GI bleed 10/16/2015  . Intractable nausea and vomiting 10/16/2015  . Hypertensive urgency 10/16/2015  . Alzheimer's disease (HCC) 05/30/2015  . Altered mental status 05/24/2015  . Occlusion and stenosis of carotid artery without mention of cerebral  infarction 10/08/2011  . CKD (chronic kidney disease) 09/25/2011  . H/O: CVA (cardiovascular accident) 09/22/2011  . Diabetes mellitus, type 2 (HCC) 09/22/2011      Prior to Admission medications   Medication Sig Start Date End Date Taking? Authorizing Provider  amLODipine (NORVASC) 10 MG tablet Take 0.5 tablets (5 mg total) by mouth daily. Patient taking differently: Take 10 mg by mouth daily.  05/31/15   Ramonita LabGouru, Aruna, MD  aspirin 81 MG chewable tablet Chew 81 mg by mouth daily.    [provider]  barrier cream (NON-SPECIFIED) CREA Apply 1 application topically 2 (two) times daily as needed (skin protection on buttocks).    [provider]  Cholecalciferol (VITAMIN D3) 50000 units CAPS Take 1 capsule by mouth every 30 (thirty) days. GIVE ON THE 28TH OF EVERY MONTH    [provider]  citalopram (CELEXA) 10 MG tablet Take 5 mg by mouth daily.     [provider]  furosemide (LASIX) 40 MG tablet Take 0.5 tablets (20 mg total) by mouth daily. 05/29/15   Ramonita LabGouru, Aruna, MD  gabapentin (NEURONTIN) 300 MG capsule Take 300 mg by mouth every evening.     [provider]  guaiFENesin (ROBITUSSIN) 100 MG/5ML liquid Take 200 mg by mouth every 4 (four) hours as needed for cough.    [provider]  hydroxypropyl methylcellulose / hypromellose (ISOPTO TEARS / GONIOVISC) 2.5 % ophthalmic solution Place 1 drop  into both eyes 3 (three) times daily as needed for dry eyes.    [provider]  insulin glargine (LANTUS) 100 UNIT/ML injection Inject 0.27 mLs (27 Units total) into the skin at bedtime. Patient taking differently: Inject 35 Units into the skin every morning.  05/31/15   Ramonita Lab, MD  levothyroxine (SYNTHROID, LEVOTHROID) 88 MCG tablet Take 88 mcg by mouth daily before breakfast.    [provider]  loratadine (CLARITIN) 10 MG tablet Take 10 mg by mouth daily as needed for allergies.    [provider]  lovastatin  (MEVACOR) 20 MG tablet Take 20 mg by mouth every evening.     [provider]  magnesium hydroxide (MILK OF MAGNESIA) 400 MG/5ML suspension Take 30 mLs by mouth 2 (two) times daily as needed for mild constipation or moderate constipation.    [provider]  memantine (NAMENDA) 10 MG tablet Take 10 mg by mouth 2 (two) times daily.     [provider]  Menthol (RICOLA CHERRY HONEY HERB) 2 MG LOZG Use as directed 1 lozenge in the mouth or throat every 2 (two) hours as needed (FOR COUGH).    [provider]  metoCLOPramide (REGLAN) 5 MG tablet Take 1 tablet (5 mg total) by mouth 3 (three) times daily before meals for 4 days. 07/17/18 07/21/18  Milagros Loll, MD  metoprolol succinate (TOPROL-XL) 25 MG 24 hr tablet Take 25 mg by mouth daily.    [provider]  ondansetron (ZOFRAN) 4 MG tablet Take 4 mg by mouth daily as needed for nausea or vomiting.     [provider]  pantoprazole (PROTONIX) 20 MG tablet Take 20 mg by mouth daily. Take one tablet by mouth before breakfast for GERD    [provider]  polyethylene glycol (MIRALAX / GLYCOLAX) packet Take 17 g by mouth daily.    [provider]  potassium chloride (K-DUR) 10 MEQ tablet Take 10 mEq by mouth daily.     [provider]  senna (SENOKOT) 8.6 MG tablet Take 1 tablet by mouth daily.    [provider]  traZODone (DESYREL) 50 MG tablet Take 25 mg by mouth at bedtime.     [provider]  triamcinolone cream (KENALOG) 0.1 % Apply 1 application topically 3 (three) times daily as needed (skin conditions).    [provider]    Allergies Fexofenadine hcl    Social History Social History   Tobacco Use  . Smoking status: Former Smoker    Types: Cigarettes    Last attempt to quit: 07/20/1985    Years since quitting: 33.1  . Smokeless tobacco: Never Used  Substance Use Topics  . Alcohol use: No  . Drug use: No    Review of  Systems Patient denies headaches, rhinorrhea, blurry vision, numbness, shortness of breath, chest pain, edema, cough, abdominal pain, nausea, vomiting, diarrhea, dysuria, fevers, rashes or hallucinations unless otherwise stated above in HPI. ____________________________________________   PHYSICAL EXAM:  VITAL SIGNS: Vitals:   08/21/18 1235 08/21/18 1300  BP: 114/60 113/70  Pulse: 89   Resp: 18 15  Temp: 98 F (36.7 C)   SpO2: 98%     Constitutional: Alert pleasant, appears in NAD Eyes: Conjunctivae are normal.  Head: Atraumatic. Nose: No congestion/rhinnorhea. Mouth/Throat: Mucous membranes are moist.   Neck: No stridor. Painless ROM.  Cardiovascular: Normal rate, regular rhythm. Grossly normal heart sounds.  Good peripheral circulation. Respiratory: Normal respiratory effort.  No retractions. Lungs CTAB.  Gastrointestinal: Soft and nontender. No distention. No abdominal bruits. No CVA tenderness. Genitourinary: deferred Musculoskeletal: No lower extremity tenderness nor edema.  No joint effusions. Neurologic:  Normal speech and language. No gross focal neurologic deficits are appreciated. No facial droop Skin:  Skin is warm, dry and intact. No rash noted. Psychiatric: Mood and affect are normal. Speech and behavior are normal.  ____________________________________________   LABS (all labs ordered are listed, but only abnormal results are displayed)  Results for orders placed or performed during the hospital encounter of 08/21/18 (from the past 24 hour(s))  CBC with Differential/Platelet     Status: Abnormal   Collection Time: 08/21/18 12:51 PM  Result Value Ref Range   WBC 11.2 (H) 4.0 - 10.5 K/uL   RBC 4.65 3.87 - 5.11 MIL/uL   Hemoglobin 11.3 (L) 12.0 - 15.0 g/dL   HCT 16.136.2 09.636.0 - 04.546.0 %   MCV 77.8 (L) 80.0 - 100.0 fL   MCH 24.3 (L) 26.0 - 34.0 pg   MCHC 31.2 30.0 - 36.0 g/dL   RDW 40.918.1 (H) 81.111.5 - 91.415.5 %   Platelets 242 150 - 400 K/uL   nRBC 0.0 0.0 - 0.2 %    Neutrophils Relative % 77 %   Neutro Abs 8.5 (H) 1.7 - 7.7 K/uL   Lymphocytes Relative 14 %   Lymphs Abs 1.6 0.7 - 4.0 K/uL   Monocytes Relative 7 %   Monocytes Absolute 0.8 0.1 - 1.0 K/uL   Eosinophils Relative 2 %   Eosinophils Absolute 0.2 0.0 - 0.5 K/uL   Basophils Relative 0 %   Basophils Absolute 0.0 0.0 - 0.1 K/uL   Immature Granulocytes 0 %   Abs Immature Granulocytes 0.04 0.00 - 0.07 K/uL  Comprehensive metabolic panel     Status: Abnormal   Collection Time: 08/21/18 12:51 PM  Result Value Ref Range   Sodium 140 135 - 145 mmol/L   Potassium 4.0 3.5 - 5.1 mmol/L   Chloride 104 98 - 111 mmol/L   CO2 30 22 - 32 mmol/L   Glucose, Bld 208 (H) 70 - 99 mg/dL   BUN 15 8 - 23 mg/dL   Creatinine, Ser 7.821.07 (H) 0.44 - 1.00 mg/dL   Calcium 8.7 (L) 8.9 - 10.3 mg/dL   Total Protein 6.6 6.5 - 8.1 g/dL   Albumin 3.3 (L) 3.5 - 5.0 g/dL   AST 23 15 - 41 U/L   ALT 12 0 - 44 U/L   Alkaline Phosphatase 92 38 - 126 U/L   Total Bilirubin 0.5 0.3 - 1.2 mg/dL   GFR calc non Af Amer 50 (L) >60 mL/min   GFR calc Af Amer 58 (L) >60 mL/min   Anion gap 6 5 - 15  Lipase, blood     Status: None   Collection Time: 08/21/18 12:51 PM  Result Value Ref Range   Lipase 21 11 - 51 U/L   ____________________________________________ ____________________________________________  RADIOLOGY   ____________________________________________   PROCEDURES  Procedure(s) performed:  Procedures    Critical Care performed: no ____________________________________________   INITIAL IMPRESSION / ASSESSMENT AND PLAN / ED COURSE  Pertinent labs & imaging results that were available during my care of the patient were reviewed by me and considered in my medical decision making (see chart for details).   DDX: gastritis, ugib, varices, lgib  [name] [name] is a 78 y.o. who presents to the ED with report of coffee-ground emesis.  She is currently hemodynamically stable.  She is pleasant  and cooperative.   Abdominal exam is soft and benign.  Plan rectal exam.  Blood work was sent for the above differential.  The patient will be placed on continuous pulse oximetry and telemetry for monitoring.  Laboratory evaluation will be sent to evaluate for the above complaints.     Clinical Course as of Aug 21 1421  Sun Aug 21, 2018  1359 Guaiac-negative stools.  No hemorrhoids.  She is not having any nausea or epigastric discomfort.  Hemoglobin is stable.  She has no elevated BUN.   [PR]  1408 Had extensive conversation with patient's daughter and medical power of attorney.  States that she only gets brought to the ER on the weekend secondary to weekend staff noticing blood in her vomit.  Demonstrates understanding of the risks associated with admission to the hospital and recognizes that her mother would not want any additional diagnostic testing at this time.  I will consult with Dr. Tobi Bastos for any further recommendations but as she is otherwise hemodynamically stable with a stable hemoglobin I doubt there is any indication for intervention or hospitalization at this time.   [PR]  1416 I discussed case with Dr. and of GI.  She is not had recent endoscopies recommends admission the hospital for serial hemoglobins.   [PR]  1422 Discussed plan of care with [name] [name], the patient's POA, states that she is a DNR but agrees to further medical work-up IV fluids and transfusion if needed but no invasive procedures, CPR or mechanical ventilation.   [PR]    Clinical Course User Index [PR] Willy Eddy, MD     As part of my medical decision making, I reviewed the following data within the electronic MEDICAL RECORD NUMBER Nursing notes reviewed and incorporated, Labs reviewed, notes from prior ED visits and [place] Controlled Substance Database   ____________________________________________   FINAL CLINICAL IMPRESSION(S) / ED DIAGNOSES  Final diagnoses:  Coffee ground emesis      NEW MEDICATIONS STARTED  DURING THIS VISIT:  New Prescriptions   No medications on file     Note:  This document was prepared using Dragon voice recognition software and may include unintentional dictation errors.    Willy Eddy, MD 08/21/18 [phone]

## 2018-08-21 NOTE — ED Notes (Signed)
Report called to 1c 

## 2018-08-21 NOTE — ED Notes (Signed)
Pt to be admitted.

## 2018-08-21 NOTE — Progress Notes (Signed)
Pt alert, cooperative. Pt responds to explaining what needs to be done/reassured prior to performing. Saline lock not present on arrival to floor with pt reporting, "I took it out." Saline lock reinserted with IVF's infusing. Resting quietly with no sign/symptom bleeding. TC with dgt with admission profile completed.

## 2018-08-21 NOTE — Progress Notes (Signed)
Patient's blood sugar 75, currently NPO on normal saline going at 18ml/hr. Notified Dr. Bosie Helper on call, received verbal order for dextrose 5%-0.9% sodium chloride at 63ml/hr.

## 2018-08-21 NOTE — H&P (Signed)
[facility] [place] at [facility]   PATIENT NAME: [name] [name] Reising    MR#:  [id number]  DATE OF BIRTH:  [birth year]  DATE OF ADMISSION:  08/21/2018  PRIMARY CARE PHYSICIAN: Karie SchwalbeLetvak, Richard I, MD   REQUESTING/REFERRING PHYSICIAN: Dr Roxan Hockeyobinson  CHIEF COMPLAINT:   Dark vomitus today at [facility] HISTORY OF PRESENT ILLNESS:  [name] [name] Worthey  is a 78 y.o. female with a known history of Gerd/gastritis, dementia, hypertension last EGD done in 2017 revealed acute gastritis comes to the emergency room from [facility] after she had coffee ground emesis noted by staff at the facility. Patient has dementia and unable to give any history review of systems. No family in the ER. ER physician spoke with daughter on the phone earlier.  Patient otherwise is hemodynamically stable. Her hemoglobin is 11. She is being admitted for G.I. bleed and observation.  PAST MEDICAL HISTORY:   Past Medical History:  Diagnosis Date  . Carotid artery occlusion 02/15/2009  . CVA (cerebral infarction)   . Dementia (HCC)   . Diabetes mellitus   . GERD (gastroesophageal reflux disease)   . Heart murmur   . HLD (hyperlipidemia)   . Hypertension   . Stroke [facility](HCC)     PAST SURGICAL HISTOIRY:   Past Surgical History:  Procedure Laterality Date  . APPENDECTOMY    . ESOPHAGOGASTRODUODENOSCOPY Left 10/17/2015   Procedure: ESOPHAGOGASTRODUODENOSCOPY (EGD);  Surgeon: Scot Junobert T Elliott, MD;  [facility];  Service: Endoscopy;  Laterality: Left;  . TONSILLECTOMY      SOCIAL HISTORY:   Social History   Tobacco Use  . Smoking status: Former Smoker    Types: Cigarettes    Last attempt to quit: 07/20/1985    Years since quitting: 33.1  . Smokeless tobacco: Never Used  Substance Use Topics  . Alcohol use: No    FAMILY HISTORY:   Family History  Problem Relation Age of Onset  . Diabetes Mother   . Hypertension Mother   . Diabetes Father   . Hypertension  Father   . Diabetes Sister   . Hypertension Sister     DRUG ALLERGIES:   Allergies  Allergen Reactions  . Fexofenadine Hcl Rash    REVIEW OF SYSTEMS:  Review of Systems  Unable to perform ROS: Dementia     MEDICATIONS AT HOME:   Prior to Admission medications   Medication Sig Start Date End Date Taking? Authorizing Provider  amLODipine (NORVASC) 10 MG tablet Take 0.5 tablets (5 mg total) by mouth daily. Patient taking differently: Take 10 mg by mouth daily.  05/31/15  Yes Gouru, Deanna ArtisAruna, MD  aspirin 81 MG chewable tablet Chew 81 mg by mouth daily.   Yes [provider]  Cholecalciferol (VITAMIN D3) 50000 units CAPS Take 1 capsule by mouth every 30 (thirty) days.    Yes [provider]  citalopram (CELEXA) 10 MG tablet Take 5 mg by mouth daily.    Yes [provider]  furosemide (LASIX) 40 MG tablet Take 0.5 tablets (20 mg total) by mouth daily. 05/29/15  Yes Gouru, Deanna ArtisAruna, MD  gabapentin (NEURONTIN) 300 MG capsule Take 300 mg by mouth every evening.    Yes [provider]  guaiFENesin (ROBITUSSIN) 100 MG/5ML liquid Take 200 mg by mouth every 4 (four) hours as needed for cough.   Yes [provider]  hydroxypropyl methylcellulose / hypromellose (ISOPTO TEARS / GONIOVISC) 2.5 % ophthalmic solution Place 1 drop into both eyes 3 (three) times  daily as needed for dry eyes.   Yes [provider]  insulin glargine (LANTUS) 100 UNIT/ML injection Inject 0.27 mLs (27 Units total) into the skin at bedtime. Patient taking differently: Inject 20 Units into the skin at bedtime.  05/31/15  Yes Gouru, Deanna Artis, MD  levothyroxine (SYNTHROID, LEVOTHROID) 88 MCG tablet Take 88 mcg by mouth every evening.    Yes [provider]  loratadine (CLARITIN REDITABS) 10 MG dissolvable tablet Take 10 mg by mouth daily as needed for allergies.    Yes [provider]  lovastatin (MEVACOR) 20 MG tablet Take 20 mg by mouth every evening.    Yes  [provider]  magnesium hydroxide (MILK OF MAGNESIA) 400 MG/5ML suspension Take 30 mLs by mouth 2 (two) times daily as needed for mild constipation or moderate constipation.   Yes [provider]  memantine (NAMENDA) 10 MG tablet Take 10 mg by mouth 2 (two) times daily.    Yes [provider]  Menthol (RICOLA CHERRY HONEY HERB) 2 MG LOZG Use as directed 1 lozenge in the mouth or throat every 2 (two) hours as needed (FOR COUGH).   Yes [provider]  metoCLOPramide (REGLAN) 5 MG tablet Take 5 mg by mouth 3 (three) times daily before meals.   Yes [provider]  metoprolol succinate (TOPROL-XL) 25 MG 24 hr tablet Take 25 mg by mouth daily.   Yes [provider]  ondansetron (ZOFRAN) 4 MG tablet Take 4 mg by mouth daily as needed for nausea or vomiting.    Yes [provider]  pantoprazole (PROTONIX) 20 MG tablet Take 20 mg by mouth daily before breakfast.    Yes [provider]  polyethylene glycol (MIRALAX / GLYCOLAX) packet Take 17 g by mouth daily.   Yes [provider]  potassium chloride (K-DUR) 10 MEQ tablet Take 10 mEq by mouth daily.    Yes [provider]  senna-docusate (SENOKOT-S) 8.6-50 MG tablet Take 2 tablets by mouth daily.   Yes [provider]  traZODone (DESYREL) 50 MG tablet Take 25 mg by mouth at bedtime.    Yes [provider]  metoCLOPramide (REGLAN) 5 MG tablet Take 1 tablet (5 mg total) by mouth 3 (three) times daily before meals for 4 days. 07/17/18 08/21/18  Milagros Loll, MD      VITAL SIGNS:  Blood pressure 113/70, pulse 89, temperature 98 F (36.7 C), temperature source Oral, resp. rate 15, weight 63.5 kg, SpO2 98 %.  PHYSICAL EXAMINATION:  GENERAL:  78 y.o.-year-old patient lying in the bed with no acute distress.  EYES: Pupils equal, round, reactive to light and accommodation. No scleral icterus. Extraocular muscles intact.  HEENT: Head atraumatic,  normocephalic. Oropharynx and nasopharynx clear.  NECK:  Supple, no jugular venous distention. No thyroid enlargement, no tenderness.  LUNGS: Normal breath sounds bilaterally, no wheezing, rales,rhonchi or crepitation. No use of accessory muscles of respiration.  CARDIOVASCULAR: S1, S2 normal. No murmurs, rubs, or gallops.  ABDOMEN: Soft, nontender, nondistended. Bowel sounds present. No organomegaly or mass.  EXTREMITIES: No pedal edema, cyanosis, or clubbing.  NEUROLOGIC: Cranial nerves II through XII are grossly intact. Muscle strength 5/5 in all extremities. Grossly nonfocal. Moves all extremities well. Sensation intact. Gait not checked.  PSYCHIATRIC: The patient is alert , confused secondary to dementia  SKIN: No obvious rash, lesion, or ulcer.   LABORATORY PANEL:   CBC Recent Labs  Lab 08/21/18 1251  WBC 11.2*  HGB 11.3*  HCT 36.2  PLT 242   ------------------------------------------------------------------------------------------------------------------  Chemistries  Recent Labs  Lab 08/21/18 1251  NA 140  K 4.0  CL 104  CO2 30  GLUCOSE 208*  BUN 15  CREATININE 1.07*  CALCIUM 8.7*  AST 23  ALT 12  ALKPHOS 92  BILITOT 0.5   ------------------------------------------------------------------------------------------------------------------  Cardiac Enzymes No results for input(s): TROPONINI in the last 168 hours. ------------------------------------------------------------------------------------------------------------------  RADIOLOGY:  No results found.  EKG:    IMPRESSION AND PLAN:   [name] [name] Dillie  is a 78 y.o. female with a known history of Gerd/gastritis, dementia, hypertension last EGD done in 2017 revealed acute gastritis comes [facility] the emergency room from [facility] after she had coffee ground emesis noted by staff at the facility.  1. Coffee ground emesis in the setting of Gerd/history of gastritis -admit for overnight  observation -IV Protonix 40 mg IV BID -ER physician Dr. Roxan Hockeyobinson spoke with G.I. Dr. Tobi BastosAnna--- recommends for overnight observation -clear liquid diet -Monitor hemoglobin  2. Dementia chronic  3. Hyperlipidemia continue home meds  4. Diabetes  sliding scale insulin -human home meds prior to discharge if patient able to take oral diet  5. DVT prophylaxis SCD  Code status was address by Dr. Roxan Hockeyobinson over the phone with daughter Okey RegalCarol and patient is a DNR  All the records are reviewed and case discussed with ED provider.   CODE STATUS:DNR  TOTAL TIME TAKING CARE OF THIS PATIENT: *50* minutes.    Enedina FinnerSona Makye Radle M.D on 08/21/2018 at 2:48 PM  Between 7am to 6pm - Pager - [phone]  After 6pm go to [email or web address] - password [email or web address] [facility]  [facility]  Office  [phone]  CC: Primary care physician; Karie SchwalbeLetvak, Richard I, MD

## 2018-08-22 ENCOUNTER — Encounter: Payer: Self-pay | Admitting: *Deleted

## 2018-08-22 ENCOUNTER — Encounter
Admission: EM | Disposition: A | Discharge status: Skilled Nursing Facility | Payer: Self-pay | Source: Home / Self Care | Attending: Student in an Organized Health Care Education/Training Program

## 2018-08-22 ENCOUNTER — Observation Stay: Payer: Medicare Other | Admitting: Anesthesiology

## 2018-08-22 DIAGNOSIS — K449 Diaphragmatic hernia without obstruction or gangrene: Secondary | ICD-10-CM | POA: Diagnosis not present

## 2018-08-22 DIAGNOSIS — K210 Gastro-esophageal reflux disease with esophagitis: Secondary | ICD-10-CM | POA: Diagnosis not present

## 2018-08-22 DIAGNOSIS — K920 Hematemesis: Secondary | ICD-10-CM | POA: Diagnosis not present

## 2018-08-22 DIAGNOSIS — K317 Polyp of stomach and duodenum: Secondary | ICD-10-CM | POA: Diagnosis not present

## 2018-08-22 DIAGNOSIS — K2210 Ulcer of esophagus without bleeding: Secondary | ICD-10-CM

## 2018-08-22 HISTORY — PX: ESOPHAGOGASTRODUODENOSCOPY: SHX5428

## 2018-08-22 LAB — GLUCOSE, CAPILLARY
Glucose-Capillary: 107 mg/dL — ABNORMAL HIGH (ref 70–99)
Glucose-Capillary: 116 mg/dL — ABNORMAL HIGH (ref 70–99)
Glucose-Capillary: 158 mg/dL — ABNORMAL HIGH (ref 70–99)
Glucose-Capillary: 142 mg/dL — ABNORMAL HIGH (ref 70–99)
Glucose-Capillary: 163 mg/dL — ABNORMAL HIGH (ref 70–99)
Glucose-Capillary: 200 mg/dL — ABNORMAL HIGH (ref 70–99)

## 2018-08-22 LAB — HEMOGLOBIN
Hemoglobin: 9.1 g/dL — ABNORMAL LOW (ref 12.0–15.0)
Hemoglobin: 9.8 g/dL — ABNORMAL LOW (ref 12.0–15.0)
Hemoglobin: 10.1 g/dL — ABNORMAL LOW (ref 12.0–15.0)

## 2018-08-22 LAB — FOLATE: Folate: 15 ng/mL (ref >5.9)

## 2018-08-22 LAB — VITAMIN B12: Vitamin B-12: 365 pg/mL (ref 180–914)

## 2018-08-22 LAB — FERRITIN: Ferritin: 12 ng/mL (ref 11–307)

## 2018-08-22 LAB — IRON AND TIBC
Iron: 23 ug/dL — ABNORMAL LOW (ref 28–170)
SATURATION RATIOS: 6 % — AB (ref 10.4–31.8)
TIBC: 359 ug/dL (ref 250–450)
UIBC: 336 ug/dL

## 2018-08-22 SURGERY — EGD (ESOPHAGOGASTRODUODENOSCOPY)
Anesthesia: General

## 2018-08-22 MED ORDER — SODIUM CHLORIDE 0.9 % IV SOLN: INTRAVENOUS | Status: DC

## 2018-08-22 MED ORDER — PROPOFOL 10 MG/ML IV BOLUS
INTRAVENOUS | Status: DC | PRN
  Administered 2018-08-22: 40 mg via INTRAVENOUS

## 2018-08-22 MED ORDER — SODIUM CHLORIDE 0.9 % IV BOLUS
1000.0000 mL | Freq: Once | INTRAVENOUS | Status: AC
  Administered 2018-08-22: 06:00:00 1000 mL via INTRAVENOUS

## 2018-08-22 MED ORDER — PROPOFOL 500 MG/50ML IV EMUL
INTRAVENOUS | Status: DC | PRN
  Administered 2018-08-22: 100 ug/kg/min via INTRAVENOUS

## 2018-08-22 MED ORDER — SODIUM CHLORIDE 0.9 % IV SOLN
INTRAVENOUS | Status: DC
  Administered 2018-08-22: 1000 mL via INTRAVENOUS

## 2018-08-22 MED ORDER — QUETIAPINE FUMARATE 25 MG PO TABS: 25.0000 mg | ORAL_TABLET | Freq: Once | ORAL | Status: DC | PRN

## 2018-08-22 NOTE — Progress Notes (Addendum)
Urine concentrated, foul odor, received verbal order by Dr. Kathe Mariner for U/A and bolus of NS 1L over an hour for dehydration.

## 2018-08-22 NOTE — Clinical Social Work Note (Signed)
Clinical Social Work Assessment  Patient Details  Name: [name] [name] MRN: [id number] Date of Birth: 11/23/1940  Date of referral:  08/22/18               Reason for consult:  Facility Placement                Permission sought to share information with:  Case Manager, Facility Contact Representative, Family Supports Permission granted to share information::  Yes, Verbal Permission Granted  Name::        Agency::     Relationship::     Contact Information:     Housing/Transportation Living arrangements for the past 2 months:  Skilled Nursing Facility Source of Information:  Adult Children Patient Interpreter Needed:  None Criminal Activity/Legal Involvement Pertinent to Current Situation/Hospitalization:  No - Comment as needed Significant Relationships:  Adult Children Lives with:  Facility Resident Do you feel safe going back to the place where you live?  Yes Need for family participation in patient care:  Yes (Comment)  Care giving concerns:  Patient is a long term resident at [facility]    Social Worker assessment / plan:  CSW consulted for facility placement. CSW met with patient and daughter [name] Holmes at bedside. Daughter states that patient lives at [facility] and has been a resident there for about 2 years. Patient is confused and unable to complete assessment. Daughter reports that patient should return to [facility] when medically ready. CSW spoke with Andrea at [facility] regarding patient. Andrea confirms that patient is long term care in the SNF at [facility] and can return when ready. CSW will continue to follow for discharge planning.   Employment status:  Retired Insurance information:  Medicare PT Recommendations:  Not assessed at this time Information / Referral to community resources:  Skilled Nursing Facility  Patient/Family's Response to care:  Daughter thanked CSW for assistance   Patient/Family's Understanding of and Emotional Response to Diagnosis,  Current Treatment, and Prognosis:  Daughter is aware of current prognosis.   Emotional Assessment Appearance:  Appears stated age Attitude/Demeanor/Rapport:    Affect (typically observed):  Anxious, Restless Orientation:  Oriented to Self, Oriented to Place Alcohol / Substance use:  Not Applicable Psych involvement (Current and /or in the community):  No (Comment)  Discharge Needs  Concerns to be addressed:  Discharge Planning Concerns Readmission within the last 30 days:  No Current discharge risk:  None Barriers to Discharge:  Continued Medical Work up     , LCSWA 08/22/2018, 10:48 AM  

## 2018-08-22 NOTE — Op Note (Signed)
[facility] Gastroenterology Patient Name: [name] [name] Procedure Date: 08/22/2018 4:50 PM MRN: [id number] Account #: [id number] Date of Birth: June 29, 1941 Admit Type: Outpatient Age: 78 Room: [facility] ENDO ROOM 3 Gender: Female Note Status: Finalized Procedure:            Upper GI endoscopy Indications:          Coffee-ground emesis Providers:            Lin Landsman MD, MD Medicines:            Monitored Anesthesia Care Complications:        No immediate complications. Estimated blood loss: None. Procedure:            Pre-Anesthesia Assessment:                       - Prior to the procedure, a History and Physical was                        performed, and patient medications and allergies were                        reviewed. The patient is unable to give consent                        secondary to the patient being legally incompetent to                        consent. The risks and benefits of the procedure and                        the sedation options and risks were discussed with the                        patient's daughter. All questions were answered and                        informed consent was obtained. Patient identification                        and proposed procedure were verified by the physician,                        the nurse, the anesthesiologist, the anesthetist and                        the technician in the pre-procedure area in the                        procedure room in the endoscopy suite. Mental Status                        Examination: dementia. Airway Examination: normal                        oropharyngeal airway and neck mobility. Respiratory                        Examination: clear to auscultation. CV Examination:  normal. Prophylactic Antibiotics: The patient does not                        require prophylactic antibiotics. Prior Anticoagulants:                        The patient has taken no  previous anticoagulant or                        antiplatelet agents. ASA Grade Assessment: IV - A                        patient with severe systemic disease that is a constant                        threat to life. After reviewing the risks and benefits,                        the patient was deemed in satisfactory condition to                        undergo the procedure. The anesthesia plan was to use                        monitored anesthesia care (MAC). Immediately prior to                        administration of medications, the patient was                        re-assessed for adequacy to receive sedatives. The                        heart rate, respiratory rate, oxygen saturations, blood                        pressure, adequacy of pulmonary ventilation, and                        response to care were monitored throughout the                        procedure. The physical status of the patient was                        re-assessed after the procedure.                       After obtaining informed consent, the endoscope was                        passed under direct vision. Throughout the procedure,                        the patient's blood pressure, pulse, and oxygen                        saturations were monitored continuously. The Endoscope  was introduced through the mouth, and advanced to the                        second part of duodenum. The upper GI endoscopy was                        accomplished without difficulty. The patient tolerated                        the procedure well. Findings:      The duodenal bulb and second portion of the duodenum were normal.      Multiple 3 to 5 mm sessile polyps with no bleeding and no stigmata of       recent bleeding were found in the gastric body.      A medium-sized hiatal hernia was present.      One superficial esophageal ulcer with no bleeding and no stigmata of       recent bleeding was found at  the gastroesophageal junction. The lesion       was 5 mm in largest dimension.      LA Grade B (one or more mucosal breaks greater than 5 mm, not extending       between the tops of two mucosal folds) esophagitis with no bleeding was       found in the lower third of the esophagus. Impression:           - Normal duodenal bulb and second portion of the                        duodenum.                       - Multiple gastric polyps.                       - Medium-sized hiatal hernia.                       - Non-bleeding esophageal ulcer.                       - LA Grade B reflux esophagitis.                       - No specimens collected. Recommendation:       - Return patient to hospital ward for ongoing care.                       - Advance diet as tolerated today.                       - Continue present medications.                       - Use Protonix (pantoprazole) 40 mg PO BID indefinitely. Procedure Code(s):    --- Professional ---                       [phone], Esophagogastroduodenoscopy, flexible, transoral;                        diagnostic, including collection of specimen(s) by  brushing or washing, when performed (separate procedure) Diagnosis Code(s):    --- Professional ---                       K31.7, Polyp of stomach and duodenum                       K44.9, Diaphragmatic hernia without obstruction or                        gangrene                       K22.10, Ulcer of esophagus without bleeding                       K21.0, Gastro-esophageal reflux disease with esophagitis                       K92.0, Hematemesis CPT copyright 2018 American Medical Association. All rights reserved. The codes documented in this report are preliminary and upon coder review may  be revised to meet current compliance requirements. Dr. Ulyess Mort Lin Landsman MD, MD 08/22/2018 5:13:39 PM This report has been signed electronically. Number of Addenda: 0 Note  Initiated On: 08/22/2018 4:50 PM      [facility]

## 2018-08-22 NOTE — Progress Notes (Signed)
Patient has not voided this shift, shown in bladder scan, notified Dr. Kathe Mariner, received verbal order for an in/out cath one time.

## 2018-08-22 NOTE — NC FL2 (Signed)
  [place] MEDICAID FL2 LEVEL OF CARE SCREENING TOOL     IDENTIFICATION  Patient Name: [name] [name] Birthdate: January 11, 1941 Sex: female Admission Date (Current Location): 08/21/2018  [place] and [place] Number:  Chiropodist and Address:  [facility], [address] [place], [place] [address]      Provider Number: [id number]  Attending Physician Name and Address:  [name], *  Relative Name and Phone Number:       Current Level of Care: Hospital Recommended Level of Care: Skilled Nursing Facility Prior Approval Number:    Date Approved/Denied:   PASRR Number:    Discharge Plan: SNF    Current Diagnoses: Patient Active Problem List   Diagnosis Date Noted  . GI bleed 07/15/2018  . DKA (diabetic ketoacidoses) (HCC) 03/18/2018  . Upper GI bleed 10/16/2015  . Intractable nausea and vomiting 10/16/2015  . Hypertensive urgency 10/16/2015  . Alzheimer's disease (HCC) 05/30/2015  . Altered mental status 05/24/2015  . Occlusion and stenosis of carotid artery without mention of cerebral infarction 10/08/2011  . CKD (chronic kidney disease) 09/25/2011  . H/O: CVA (cardiovascular accident) 09/22/2011  . Diabetes mellitus, type 2 (HCC) 09/22/2011    Orientation RESPIRATION BLADDER Height & Weight     Self, Place  Normal Incontinent Weight: 140 lb (63.5 kg) Height:  5\' 1"  (154.9 cm)  BEHAVIORAL SYMPTOMS/MOOD NEUROLOGICAL BOWEL NUTRITION STATUS  (none) (none) Continent Diet(NPO to be advanced )  AMBULATORY STATUS COMMUNICATION OF NEEDS Skin   Extensive Assist Verbally Normal                       Personal Care Assistance Level of Assistance  Bathing, Feeding, Dressing Bathing Assistance: Limited assistance Feeding assistance: Independent Dressing Assistance: Maximum assistance     Functional Limitations Info  Sight, Hearing, Speech Sight Info: Adequate Hearing Info: Adequate Speech Info: Adequate    SPECIAL  CARE FACTORS FREQUENCY                       Contractures Contractures Info: Not present    Additional Factors Info  Code Status, Allergies Code Status Info: DNR Allergies Info: Fexofenadine HCL            Current Medications (08/22/2018):  This is the current hospital active medication list Current Facility-Administered Medications  Medication Dose Route Frequency Provider Last Rate Last Dose  . acetaminophen (TYLENOL) tablet 650 mg  650 mg Oral Q6H PRN [name], MD       Or  . acetaminophen (TYLENOL) suppository 650 mg  650 mg Rectal Q6H PRN [name], MD      . dextrose 5 %-0.9 % sodium chloride infusion   Intravenous Continuous Barbaraann Rondo, MD 50 mL/hr at 08/22/18 0734    . insulin aspart (novoLOG) injection 0-9 Units  0-9 Units Subcutaneous TID WC [name], MD   2 Units at 08/21/18 1732  . pantoprazole (PROTONIX) injection 40 mg  40 mg Intravenous Q12H [name], MD   40 mg at 08/22/18 0925  . QUEtiapine (SEROQUEL) tablet 25 mg  25 mg Oral QHS Willy Eddy, MD   25 mg at 08/21/18 2100     Discharge Medications: Please see discharge summary for a list of discharge medications.  Relevant Imaging Results:  Relevant Lab Results:   Additional Information    Ashford Clouse  Rinaldo Ratel, [address]

## 2018-08-22 NOTE — Progress Notes (Addendum)
Initial Nutrition Assessment  DOCUMENTATION CODES:   Not applicable  INTERVENTION:  - MVI daily - Monitor as diet advances - Once diet advances; Ensure Enlive po BID, each supplement provides 350 kcal and 20 grams of protein - Liberalize diet if possible  NUTRITION DIAGNOSIS:   Unintentional weight loss related to chronic illness(dementia) as evidenced by 9 percent weight loss since 07/15/18.  GOAL:   Patient will meet greater than or equal to 90% of their needs  MONITOR:   PO intake, Diet advancement  REASON FOR ASSESSMENT:   Consult Assessment of nutrition requirement/status  ASSESSMENT:   Pt with PMH of GERD, HTN, dementia, T2DM, previous GI bleeds, HLD, and CVA. Admitted from [facility] 08/20/2018 d/t episodes of vomiting dark emesis.  Saw pt at bedside; pt was unable to give history and was agitated. No family members were present. Spoke with nurse who reported that she was going down for EGD. Pt remaining NPO.  Pt refused NFPE; unable to determine if pt meets criteria for malnutrition d/t inability to obtain NFPE, wt history, or intake history. Per pt chart 9% wt loss since 07/15/18 which is significant for the time frame.   Medications reviewed and include: insulin aspart 0-9 units 3x daily, pantoprazole 40mg , NaCl w/dextrose 29ml/hr Labs reviewed: CBG (last 3)  Recent Labs    08/22/18 0257 08/22/18 0738 08/22/18 1145  GLUCAP 107* 116* 158*   Die t Order:   Diet Order            Diet NPO time specified Except for: Sips with Meds  Diet effective now             EDUCATION NEEDS:   No education needs have been identified at this time  Skin:  Skin Assessment: Reviewed RN Assessment  Last BM:  2/2  Height:   Ht Readings from Last 1 Encounters:  08/21/18 5\' 1"  (1.549 m)   Weight:   Wt Readings from Last 1 Encounters:  08/21/18 63.5 kg   Ideal Body Weight:  47.72 kg  BMI:  Body mass index is 26.45 kg/m.  Estimated Nutritional Needs:    Kcal:  1400-1600 kcal  Protein:  65-75 g  Fluid:  >/= 1.4L    [employer] Dietetic Intern

## 2018-08-22 NOTE — Progress Notes (Signed)
   08/22/18 1000  Clinical Encounter Type  Visited With Patient  Visit Type Follow-up  Referral From Nurse  Spiritual Encounters  Spiritual Needs Prayer  Stress Factors  Patient Stress Factors Major life changes  Ch visited to respond to an OR. Pt was awake and alert. Ch gently introduced herself and asked her about her day and her daughter. Pt expressed her desire to go back to [place] and ch prayed with her for a quick recovery and a quick return home. Pt appreciated.

## 2018-08-22 NOTE — Care Management Obs Status (Signed)
MEDICARE OBSERVATION STATUS NOTIFICATION   Patient Details  Name: [name] [name] MRN: [id number] Date of Birth: February 06, 1941   Medicare Observation Status Notification Given:  Yes    Eber Hong, RN 08/22/2018, 8:58 AM

## 2018-08-22 NOTE — Care Management Note (Signed)
Case Management Note  Patient Details  Name: [name] [name] MRN: [id number] Date of Birth: [birth year]  Subjective/Objective:                 Patient placed in observation for episodes of vomiting dark emesis. She has advanced dementia and resides in the [place] building at [facility] under a long term care plan of treatment.   Action/Plan: Anticipate return to [place] when medically stable  Expected Discharge Date:                  Expected Discharge Plan:     In-House Referral:     Discharge planning Services     Post Acute Care Choice:    Choice offered to:     DME Arranged:    DME Agency:     HH Arranged:    HH Agency:     Status of Service:     If discussed at [employer], dates discussed:    Additional Comments:  Eber Hong, RN 08/22/2018, 8:53 AM

## 2018-08-22 NOTE — Consult Note (Signed)
Cephas Darby, MD [address]  [place]  [place], [place] [address]  Main: [phone]  Fax: [phone] Pager: [phone]   Consultation  Referring Provider:     No ref. provider found Primary Care Physician:  Venia Carbon, MD Primary Gastroenterologist:  Dr. Vira Agar         Reason for Consultation:     Coffee-ground emesis  Date of Admission:  08/21/2018 Date of Consultation:  08/22/2018         HPI:   [name] [name] is a 78 y.o. female with diabetes, hypertension, history of dementia, brought by EMS from [facility] due to coffee-ground emesis.  She was admitted in the past with coffee-ground emesis.  Most recent admission was in 06/2018.  Had coffee-ground emesis resolved and hemoglobin was 11.5 on day of discharge.  EGD was deferred at that time as her hemoglobin was stable She is on Protonix 20 mg daily as outpatient No known history of chronic liver disease  Her hemoglobin yesterday was 11.3, decreased to 9.1 today.  She was started on Protonix IV twice daily and kept n.p.o. patient answer simple questions and follows simple commands  NSAIDs: None  Antiplts/Anticoagulants/Anti thrombotics: ASA35m  GI Procedures: In 2017 for coffee-ground emesis by Dr ETiffany Kocher- Normal esophagus. - Small hiatal hernia. - Gastritis. - Multiple gastric polyps. - Mucosal nodule found in the duodenum. - Normal second portion of the duodenum. - No specimens collected.   Past Medical History:  Diagnosis Date  . Carotid artery occlusion 02/15/2009  . CVA (cerebral infarction)   . [place]   . Diabetes mellitus   . GERD (gastroesophageal reflux disease)   . Heart murmur   . HLD (hyperlipidemia)   . Hypertension   . Stroke ([facility]     Past Surgical History:  Procedure Laterality Date  . APPENDECTOMY    . ESOPHAGOGASTRODUODENOSCOPY Left 10/17/2015   Procedure: ESOPHAGOGASTRODUODENOSCOPY (EGD);  Surgeon: RManya Silvas MD;  [facility]   Service: Endoscopy;  Laterality: Left;  . TONSILLECTOMY      Prior to Admission medications   Medication Sig Start Date End Date Taking? Authorizing Provider  amLODipine (NORVASC) 10 MG tablet Take 0.5 tablets (5 mg total) by mouth daily. Patient taking differently: Take 10 mg by mouth daily.  05/31/15  Yes Gouru, AIllene Silver MD  aspirin 81 MG chewable tablet Chew 81 mg by mouth daily.   Yes [provider]  Cholecalciferol (VITAMIN D3) 50000 units CAPS Take 1 capsule by mouth every 30 (thirty) days.    Yes [provider]  citalopram (CELEXA) 10 MG tablet Take 5 mg by mouth daily.    Yes [provider]  furosemide (LASIX) 40 MG tablet Take 0.5 tablets (20 mg total) by mouth daily. 05/29/15  Yes Gouru, AIllene Silver MD  gabapentin (NEURONTIN) 300 MG capsule Take 300 mg by mouth every evening.    Yes [provider]  guaiFENesin (ROBITUSSIN) 100 MG/5ML liquid Take 200 mg by mouth every 4 (four) hours as needed for cough.   Yes [provider]  hydroxypropyl methylcellulose / hypromellose (ISOPTO TEARS / GONIOVISC) 2.5 % ophthalmic solution Place 1 drop into both eyes 3 (three) times daily as needed for dry eyes.   Yes [provider]  insulin glargine (LANTUS) 100 UNIT/ML injection Inject 0.27 mLs (27 Units total) into the skin at bedtime. Patient taking differently: Inject 20 Units into the skin at bedtime.  05/31/15  Yes Gouru, AIllene Silver  MD  levothyroxine (SYNTHROID, LEVOTHROID) 88 MCG tablet Take 88 mcg by mouth every evening.    Yes [provider]  loratadine (CLARITIN REDITABS) 10 MG dissolvable tablet Take 10 mg by mouth daily as needed for allergies.    Yes [provider]  lovastatin (MEVACOR) 20 MG tablet Take 20 mg by mouth every evening.    Yes [provider]  magnesium hydroxide (MILK OF MAGNESIA) 400 MG/5ML suspension Take 30 mLs by mouth 2 (two) times daily as needed for mild constipation or moderate constipation.    Yes [provider]  memantine (NAMENDA) 10 MG tablet Take 10 mg by mouth 2 (two) times daily.    Yes [provider]  Menthol (RICOLA CHERRY HONEY HERB) 2 MG LOZG Use as directed 1 lozenge in the mouth or throat every 2 (two) hours as needed (FOR COUGH).   Yes [provider]  metoCLOPramide (REGLAN) 5 MG tablet Take 5 mg by mouth 3 (three) times daily before meals.   Yes [provider]  metoprolol succinate (TOPROL-XL) 25 MG 24 hr tablet Take 25 mg by mouth daily.   Yes [provider]  ondansetron (ZOFRAN) 4 MG tablet Take 4 mg by mouth daily as needed for nausea or vomiting.    Yes [provider]  pantoprazole (PROTONIX) 20 MG tablet Take 20 mg by mouth daily before breakfast.    Yes [provider]  polyethylene glycol (MIRALAX / GLYCOLAX) packet Take 17 g by mouth daily.   Yes [provider]  potassium chloride (K-DUR) 10 MEQ tablet Take 10 mEq by mouth daily.    Yes [provider]  senna-docusate (SENOKOT-S) 8.6-50 MG tablet Take 2 tablets by mouth daily.   Yes [provider]  traZODone (DESYREL) 50 MG tablet Take 25 mg by mouth at bedtime.    Yes [provider]  metoCLOPramide (REGLAN) 5 MG tablet Take 1 tablet (5 mg total) by mouth 3 (three) times daily before meals for 4 days. 07/17/18 08/21/18  Hillary Bow, MD    Current Facility-Administered Medications:  .  0.9 %  sodium chloride infusion, , Intravenous, Continuous, Denis Carreon, Tally Due, MD .  Doug Sou Hold] acetaminophen (TYLENOL) tablet 650 mg, 650 mg, Oral, Q6H PRN **OR** [MAR Hold] acetaminophen (TYLENOL) suppository 650 mg, 650 mg, Rectal, Q6H PRN, Fritzi Mandes, MD .  dextrose 5 %-0.9 % sodium chloride infusion, , Intravenous, Continuous, Sridharan, Prasanna, MD, Last Rate: 50 mL/hr at 08/22/18 0734 .  [MAR Hold] insulin aspart (novoLOG) injection 0-9 Units, 0-9 Units, Subcutaneous, TID WC, Fritzi Mandes, MD, 2 Units at 08/22/18  1228 .  [MAR Hold] pantoprazole (PROTONIX) injection 40 mg, 40 mg, Intravenous, Q12H, Fritzi Mandes, MD, 40 mg at 08/22/18 0925 .  [MAR Hold] QUEtiapine (SEROQUEL) tablet 25 mg, 25 mg, Oral, QHS, Merlyn Lot, MD, 25 mg at 08/21/18 2100  Family History  Problem Relation Age of Onset  . Diabetes Mother   . Hypertension Mother   . Diabetes Father   . Hypertension Father   . Diabetes Sister   . Hypertension Sister      Social History   Tobacco Use  . Smoking status: Former Smoker    Types: Cigarettes    Last attempt to quit: 07/20/1985    Years since quitting: 33.1  . Smokeless tobacco: Never Used  Substance Use Topics  . Alcohol use: No  . Drug use: No    Allergies as of 08/21/2018 - Review Complete 08/21/2018  Allergen Reaction  Noted  . Fexofenadine hcl Rash 05/27/2011    Review of Systems:    All systems reviewed and negative except where noted in HPI.   Physical Exam:  Vital signs in last 24 hours: Temp:  [97.1 F (36.2 C)-99.3 F (37.4 C)] 97.1 F (36.2 C) (02/03 1538) Pulse Rate:  [70-89] 89 (02/03 1538) Resp:  [16-19] 18 (02/03 1538) BP: (113-135)/(46-60) 135/48 (02/03 0923) SpO2:  [97 %-100 %] 100 % (02/03 1538) Last BM Date: (unknown) General:   Pleasant, cooperative in NAD Head:  Normocephalic and atraumatic. Eyes:   No icterus.   Conjunctiva pink. PERRLA. Ears:  Normal auditory acuity. Neck:  Supple; no masses or thyroidomegaly Lungs: Respirations even and unlabored. Lungs clear to auscultation bilaterally.   No wheezes, crackles, or rhonchi.  Heart:  Regular rate and rhythm;  Without murmur, clicks, rubs or gallops Abdomen:  Soft, nondistended, nontender. Normal bowel sounds. No appreciable masses or hepatomegaly.  No rebound or guarding.  Rectal:  Not performed. Msk:  Symmetrical without gross deformities.  Extremities:  Without edema, cyanosis or clubbing. Neurologic:  Alert and oriented x1;  grossly normal neurologically. Skin:  Intact without  significant lesions or rashes. Psych:  Alert and cooperative. Normal affect.  LAB RESULTS: CBC Latest Ref Rng & Units 08/22/2018 08/22/2018 08/21/2018  WBC 4.0 - 10.5 K/uL - - -  Hemoglobin 12.0 - 15.0 g/dL 9.8(L) 9.1(L) 10.3(L)  Hematocrit 36.0 - 46.0 % - - -  Platelets 150 - 400 K/uL - - -    BMET BMP Latest Ref Rng & Units 08/21/2018 07/16/2018 07/16/2018  Glucose 70 - 99 mg/dL 208(H) 461(H) 372(H)  BUN 8 - 23 mg/dL 15 - 18  Creatinine 0.44 - 1.00 mg/dL 1.07(H) - 1.40(H)  Sodium 135 - 145 mmol/L 140 - 147(H)  Potassium 3.5 - 5.1 mmol/L 4.0 - 4.2  Chloride 98 - 111 mmol/L 104 - 116(H)  CO2 22 - 32 mmol/L 30 - 19(L)  Calcium 8.9 - 10.3 mg/dL 8.7(L) - 8.8(L)    LFT Hepatic Function Latest Ref Rng & Units 08/21/2018 03/19/2018 03/18/2018  Total Protein 6.5 - 8.1 g/dL 6.6 6.1(L) 8.1  Albumin 3.5 - 5.0 g/dL 3.3(L) 3.0(L) 3.9  AST 15 - 41 U/L 23 21 28   ALT 0 - 44 U/L 12 12 16   Alk Phosphatase 38 - 126 U/L 92 81 128(H)  Total Bilirubin 0.3 - 1.2 mg/dL 0.5 0.5 1.3(H)     STUDIES: No results found.    Impression / Plan:   [name] [name] is a 78 y.o. female with history of dementia, diabetes, recurrent episodes of coffee-ground emesis admitted from [facility] with coffee-ground emesis, mild drop in hemoglobin.  Differentials include erosive esophagitis or peptic ulcer disease or Mallory-Weiss tear or less likely malignancy.  No evidence of cirrhosis  Since this is a recurring episode leading to multiple admissions in the past, recommend EGD for further evaluation Maintain n.p.o. Continue Protonix 40 mg IV twice daily for now Monitor hemoglobin daily Daughter consented for EGD today  Thank you for involving me in the care of this patient.  Will follow along with you    LOS: 0 days   Sherri Sear, MD  08/22/2018, 3:58 PM   Note: This dictation was prepared with Dragon dictation along with smaller phrase technology. Any transcriptional errors that result from this process are  unintentional.

## 2018-08-22 NOTE — Anesthesia Post-op Follow-up Note (Signed)
Anesthesia QCDR form completed.        

## 2018-08-22 NOTE — Anesthesia Postprocedure Evaluation (Signed)
Anesthesia Post Note  Patient: [name] [name]  Procedure(s) Performed: ESOPHAGOGASTRODUODENOSCOPY (EGD) (N/A )  Patient location during evaluation: Endoscopy Anesthesia Type: General Level of consciousness: awake and alert Pain management: pain level controlled Vital Signs Assessment: post-procedure vital signs reviewed and stable Respiratory status: spontaneous breathing, nonlabored ventilation, respiratory function stable and patient connected to nasal cannula oxygen Cardiovascular status: blood pressure returned to baseline and stable Postop Assessment: no apparent nausea or vomiting Anesthetic complications: no     Last Vitals:  Vitals:   08/22/18 1735 08/22/18 1803  BP: (!) 145/72 140/74  Pulse: 89 91  Resp: 20 20  Temp:  37.1 C  SpO2: 100% 98%    Last Pain:  Vitals:   08/22/18 1803  TempSrc: Oral  PainSc:                  Cleda Mccreedy Keyston Ardolino

## 2018-08-22 NOTE — Plan of Care (Signed)
  Problem: Spiritual Needs Goal: Ability to function at adequate level Outcome: Progressing   Problem: Education: Goal: Ability to identify signs and symptoms of gastrointestinal bleeding will improve Outcome: Progressing   Problem: Bowel/Gastric: Goal: Will show no signs and symptoms of gastrointestinal bleeding Outcome: Progressing   Problem: Fluid Volume: Goal: Will show no signs and symptoms of excessive bleeding Outcome: Progressing   Problem: Clinical Measurements: Goal: Complications related to the disease process, condition or treatment will be avoided or minimized Outcome: Progressing   Problem: Education: Goal: Knowledge of General Education information will improve Description Including pain rating scale, medication(s)/side effects and non-pharmacologic comfort measures Outcome: Progressing   Problem: Health Behavior/Discharge Planning: Goal: Ability to manage health-related needs will improve Outcome: Progressing   Problem: Clinical Measurements: Goal: Ability to maintain clinical measurements within normal limits will improve Outcome: Progressing Goal: Will remain free from infection Outcome: Progressing Goal: Diagnostic test results will improve Outcome: Progressing Goal: Cardiovascular complication will be avoided Outcome: Progressing   Problem: Activity: Goal: Risk for activity intolerance will decrease Outcome: Progressing   Problem: Nutrition: Goal: Adequate nutrition will be maintained Outcome: Progressing   Problem: Coping: Goal: Level of anxiety will decrease Outcome: Progressing   Problem: Elimination: Goal: Will not experience complications related to bowel motility Outcome: Progressing   Problem: Pain Managment: Goal: General experience of comfort will improve Outcome: Progressing   Problem: Safety: Goal: Ability to remain free from injury will improve Outcome: Progressing

## 2018-08-22 NOTE — Progress Notes (Signed)
[employer] [facility] -  at [facility]   PATIENT NAME: [name] RibasSophie [name]    MR#:  [id number]  DATE OF BIRTH:  07-07-1941  SUBJECTIVE:  CHIEF COMPLAINT:   Chief Complaint  Patient presents with  . Emesis   Came from the living facility with coffee-ground vomiting.  No more vomiting episode here in the hospital.  Hemoglobin dropped some but stable after that. Patient denies any complaints currently but she has dementia. REVIEW OF SYSTEMS:  Due to dementia patient cannot give any reliable review of system  ROS  DRUG ALLERGIES:   Allergies  Allergen Reactions  . Fexofenadine Hcl Rash    VITALS:  Blood pressure (!) 135/48, pulse 83, temperature 99.3 F (37.4 C), temperature source Oral, resp. rate 18, height 5\' 1"  (1.549 m), weight 63.5 kg, SpO2 100 %.  PHYSICAL EXAMINATION:  GENERAL:  78 y.o.-year-old patient lying in the bed with no acute distress.  EYES: Pupils equal, round, reactive to light and accommodation. No scleral icterus. Extraocular muscles intact.  HEENT: Head atraumatic, normocephalic. Oropharynx and nasopharynx clear.  NECK:  Supple, no jugular venous distention. No thyroid enlargement, no tenderness.  LUNGS: Normal breath sounds bilaterally, no wheezing, rales,rhonchi or crepitation. No use of accessory muscles of respiration.  CARDIOVASCULAR: S1, S2 normal. No murmurs, rubs, or gallops.  ABDOMEN: Soft, nontender, nondistended. Bowel sounds present. No organomegaly or mass.  EXTREMITIES: No pedal edema, cyanosis, or clubbing.  NEUROLOGIC: Cranial nerves II through XII are intact. Muscle strength 4/5 in all extremities. Sensation intact. Gait not checked.  PSYCHIATRIC: The patient is alert and oriented x 1-2.  SKIN: No obvious rash, lesion, or ulcer.   Physical Exam LABORATORY PANEL:   CBC Recent Labs  Lab 08/21/18 1251  08/22/18 1116  WBC 11.2*  --   --   HGB 11.3*   < > 9.8*  HCT 36.2  --   --   PLT 242  --   --    < > = values in this  interval not displayed.   ------------------------------------------------------------------------------------------------------------------  Chemistries  Recent Labs  Lab 08/21/18 1251  NA 140  K 4.0  CL 104  CO2 30  GLUCOSE 208*  BUN 15  CREATININE 1.07*  CALCIUM 8.7*  AST 23  ALT 12  ALKPHOS 92  BILITOT 0.5   ------------------------------------------------------------------------------------------------------------------  Cardiac Enzymes No results for input(s): TROPONINI in the last 168 hours. ------------------------------------------------------------------------------------------------------------------  RADIOLOGY:  No results found.  ASSESSMENT AND PLAN:   Active Problems:   GI bleed   [name] [name]  is a 78 y.o. female with a known history of Gerd/gastritis, dementia, hypertension last EGD done in 2017 revealed acute gastritis comes [facility] the emergency room [facility] after she had coffee ground emesis noted by staff at the facility.  1. Coffee ground emesis in the setting of Gerd/history of gastritis Recurrent GI bleed history in the past. -IV Protonix 40 mg IV BID -Appreciate GI consult, plan is for EGD today. -Monitor hemoglobin-dropped some but stable.  No need for transfusion yet.  2. Dementia chronic Lives in a facility due to that.  3. Hyperlipidemia continue home meds  4. Diabetes  sliding scale insulin - Resume home meds prior to discharge if patient able to take oral diet  5. DVT prophylaxis SCD    All the records are reviewed and case discussed with Care Management/Social Workerr. Management plans discussed with the patient, family and they are in agreement.  CODE STATUS: DNR  TOTAL TIME  TAKING CARE OF THIS PATIENT: 45 minutes.   Spoke to patient's daughter initially about explaining the tentative plan and findings.  After GI decided to do EGD, I called patient's daughter again to explain about the  procedure.  POSSIBLE D/C IN 1-2 DAYS, DEPENDING ON CLINICAL CONDITION.   [name] M.D on 08/22/2018   Between 7am to 6pm - Pager - [phone]  After 6pm go to [email or web address] - password [email or web address]  [facility]  Office  [phone]  CC: Primary care physician; Karie Schwalbe, MD  Note: This dictation was prepared with Dragon dictation along with smaller phrase technology. Any transcriptional errors that result from this process are unintentional.

## 2018-08-22 NOTE — Transfer of Care (Signed)
Immediate Anesthesia Transfer of Care Note  Patient: [name] [name]  Procedure(s) Performed: ESOPHAGOGASTRODUODENOSCOPY (EGD) (N/A )  Patient Location: PACU  Anesthesia Type:General  Level of Consciousness: sedated  Airway & Oxygen Therapy: Patient Spontanous Breathing and Patient connected to nasal cannula oxygen  Post-op Assessment: Report given to RN and Post -op Vital signs reviewed and stable  Post vital signs: Reviewed and stable  Last Vitals:  Vitals Value Taken Time  BP 133/60 08/22/2018  5:20 PM  Temp 36.2 C 08/22/2018  5:20 PM  Pulse 85 08/22/2018  5:20 PM  Resp 21 08/22/2018  5:20 PM  SpO2 100 % 08/22/2018  5:20 PM  Vitals shown include unvalidated device data.  Last Pain:  Vitals:   08/22/18 1715  TempSrc: (P) Tympanic  PainSc:          Complications: No apparent anesthesia complications

## 2018-08-22 NOTE — Anesthesia Preprocedure Evaluation (Signed)
Anesthesia Evaluation  Patient identified by MRN, date of birth, ID band Patient awake and Patient confused    Reviewed: Allergy & Precautions, H&P , NPO status , Patient's Chart, lab work & pertinent test results  History of Anesthesia Complications Negative for: history of anesthetic complications  Airway Mallampati: III  TM Distance: >3 FB Neck ROM: full    Dental  (+) Poor Dentition, Edentulous Upper, Edentulous Lower   Pulmonary neg shortness of breath, former smoker,           Cardiovascular Exercise Tolerance: Poor hypertension, + Valvular Problems/Murmurs      Neuro/Psych PSYCHIATRIC DISORDERS CVA    GI/Hepatic Neg liver ROS, GERD  ,  Endo/Other  diabetes, Type 2  Renal/GU Renal diseasenegative Renal ROS  negative genitourinary   Musculoskeletal   Abdominal   Peds  Hematology negative hematology ROS (+)   Anesthesia Other Findings Past Medical History: 02/15/2009: Carotid artery occlusion No date: CVA (cerebral infarction) No date: Dementia (HCC) No date: Diabetes mellitus No date: GERD (gastroesophageal reflux disease) No date: Heart murmur No date: HLD (hyperlipidemia) No date: Hypertension No date: Stroke [facility])  Past Surgical History: No date: APPENDECTOMY 10/17/2015: ESOPHAGOGASTRODUODENOSCOPY; Left     Comment:  Procedure: ESOPHAGOGASTRODUODENOSCOPY (EGD);  Surgeon:               Scot Jun, MD;  Location: [facility] ENDOSCOPY;                Service: Endoscopy;  Laterality: Left; No date: TONSILLECTOMY  BMI    Body Mass Index:  26.45 kg/m      Reproductive/Obstetrics negative OB ROS                             Anesthesia Physical Anesthesia Plan  ASA: IV  Anesthesia Plan: General   Post-op Pain Management:    Induction: Intravenous  PONV Risk Score and Plan: Propofol infusion and TIVA  Airway Management Planned: Natural Airway and Nasal  Cannula  Additional Equipment:   Intra-op Plan:   Post-operative Plan:   Informed Consent: I have reviewed the patients History and Physical, chart, labs and discussed the procedure including the risks, benefits and alternatives for the proposed anesthesia with the patient or authorized representative who has indicated his/her understanding and acceptance.   Patient has DNR.  Discussed DNR with power of attorney.   Dental Advisory Given  Plan Discussed with: Anesthesiologist, CRNA and Surgeon  Anesthesia Plan Comments: (Phone consent from daughter at [phone]  Daughter  consented for risks of anesthesia including but not limited to:  - adverse reactions to medications - risk of intubation if required - damage to teeth, lips or other oral mucosa - sore throat or hoarseness - Damage to heart, brain, lungs or loss of life  She voiced understanding.)        Anesthesia Quick Evaluation

## 2018-08-23 DIAGNOSIS — K920 Hematemesis: Secondary | ICD-10-CM | POA: Diagnosis not present

## 2018-08-23 DIAGNOSIS — K922 Gastrointestinal hemorrhage, unspecified: Secondary | ICD-10-CM | POA: Diagnosis not present

## 2018-08-23 LAB — HEMOGLOBIN: HEMOGLOBIN: 10.5 g/dL — AB (ref 12.0–15.0)

## 2018-08-23 LAB — GLUCOSE, CAPILLARY: Glucose-Capillary: 274 mg/dL — ABNORMAL HIGH (ref 70–99)

## 2018-08-23 MED ORDER — OLANZAPINE 10 MG IM SOLR
2.5000 mg | Freq: Once | INTRAMUSCULAR | Status: AC
  Administered 2018-08-23: 2.5 mg via INTRAMUSCULAR
  Filled 2018-08-23: qty 10

## 2018-08-23 MED ORDER — PANTOPRAZOLE SODIUM 40 MG PO TBEC: 40.0000 mg | DELAYED_RELEASE_TABLET | Freq: Two times a day (BID) | ORAL | 4 refills | Status: DC

## 2018-08-23 MED ORDER — HALOPERIDOL LACTATE 5 MG/ML IJ SOLN
0.5000 mg | Freq: Once | INTRAMUSCULAR | Status: AC
  Administered 2018-08-23: 0.5 mg via INTRAMUSCULAR
  Filled 2018-08-23: qty 1

## 2018-08-23 MED ORDER — ADULT MULTIVITAMIN W/MINERALS CH
1.0000 | ORAL_TABLET | Freq: Every day | ORAL | Status: DC
  Filled 2018-08-23: qty 1

## 2018-08-23 MED ORDER — ENSURE ENLIVE PO LIQD
237.0000 mL | Freq: Two times a day (BID) | ORAL | Status: DC
  Administered 2018-08-23: 237 mL via ORAL

## 2018-08-23 MED ORDER — HALOPERIDOL LACTATE 5 MG/ML IJ SOLN: 0.5000 mg | Freq: Once | INTRAMUSCULAR | Status: DC

## 2018-08-23 NOTE — Plan of Care (Signed)
  Problem: Spiritual Needs Goal: Ability to function at adequate level Outcome: Progressing   Problem: Education: Goal: Ability to identify signs and symptoms of gastrointestinal bleeding will improve Outcome: Progressing   Problem: Bowel/Gastric: Goal: Will show no signs and symptoms of gastrointestinal bleeding Outcome: Progressing   Problem: Fluid Volume: Goal: Will show no signs and symptoms of excessive bleeding Outcome: Progressing   Problem: Clinical Measurements: Goal: Complications related to the disease process, condition or treatment will be avoided or minimized Outcome: Progressing   Problem: Education: Goal: Knowledge of General Education information will improve Description Including pain rating scale, medication(s)/side effects and non-pharmacologic comfort measures Outcome: Progressing   Problem: Health Behavior/Discharge Planning: Goal: Ability to manage health-related needs will improve Outcome: Progressing   Problem: Clinical Measurements: Goal: Ability to maintain clinical measurements within normal limits will improve Outcome: Progressing Goal: Will remain free from infection Outcome: Progressing Goal: Diagnostic test results will improve Outcome: Progressing Goal: Cardiovascular complication will be avoided Outcome: Progressing   Problem: Activity: Goal: Risk for activity intolerance will decrease Outcome: Progressing   Problem: Nutrition: Goal: Adequate nutrition will be maintained Outcome: Progressing   Problem: Coping: Goal: Level of anxiety will decrease Outcome: Progressing   Problem: Elimination: Goal: Will not experience complications related to bowel motility Outcome: Progressing   Problem: Pain Managment: Goal: General experience of comfort will improve Outcome: Progressing   Problem: Safety: Goal: Ability to remain free from injury will improve Outcome: Progressing   

## 2018-08-23 NOTE — Clinical Social Work Note (Signed)
Patient is medically ready for discharge back to [facility] today. CSW notified patient and her daughter [name] [name] [phone] of discharge today. CSW also notified Sue Lush at [facility] of discharge today. Patient will be transported by EMS. RN to call report and call for transport.   Ruthe Mannan MSW, [address] [phone]

## 2018-08-23 NOTE — Progress Notes (Signed)
Report called to [facility] to [place] LPN. EMS called.

## 2018-08-23 NOTE — Progress Notes (Signed)
Pt displaying aggressiveness. Noncompliant with poor safety judgment. Repeated attempts to get out of bed screaming "get me out of here." Pt exhibiting combativeness. MD ordered .05 haldol, given with no change. MD ordered 2.5 zxprexa IM given. Pt exhibits decreased combativeness.

## 2018-08-23 NOTE — Discharge Summary (Signed)
[facility] - [place] at [facility]   PATIENT NAME: [name] [name]    MR#:  [id number]  DATE OF BIRTH:  [birth year]  DATE OF ADMISSION:  08/21/2018 ADMITTING PHYSICIAN: [name], MD  DATE OF DISCHARGE: 08/23/2018  PRIMARY CARE PHYSICIAN: Karie Schwalbe, MD    ADMISSION DIAGNOSIS:  Coffee ground emesis [K92.0]  DISCHARGE DIAGNOSIS:  Active Problems:   GI bleed  SECONDARY DIAGNOSIS:   Past Medical History:  Diagnosis Date  . Carotid artery occlusion 02/15/2009  . CVA (cerebral infarction)   . Dementia (HCC)   . Diabetes mellitus   . GERD (gastroesophageal reflux disease)   . Heart murmur   . HLD (hyperlipidemia)   . Hypertension   . Stroke [facility])     HOSPITAL COURSE:   SophieDreweryis a78 y.o.femalewith a known history of Gerd/gastritis, dementia, hypertension last EGD done in 2017 revealed acute gastritis comes to the emergency room from [facility] after she had coffee ground emesis noted by staff at the facility.  1.Coffee ground emesis in the setting of Gerd/history of gastritis Recurrent GI bleed history in the past. -IV Protonix 40 mg IV BID -Appreciate GI consult, plan is for EGD today. Findings on EGD-      - Normal duodenal bulb and second portion of the                        duodenum.                       - Multiple gastric polyps.                       - Medium-sized hiatal hernia.                       - Non-bleeding esophageal ulcer.                       - LA Grade B reflux esophagitis. Recommend- Lifelong Protonix 40 mg BID. -Monitor hemoglobin-dropped some but stable.  No need for transfusion yet. Stable around 10.  2.Dementia chronic Lives in a facility due to that.  3.Hyperlipidemia continue home meds  4.Diabetes sliding scale insulin - Resume home meds prior to discharge if patient able to take oral diet  5.DVT prophylaxis SCD   DISCHARGE CONDITIONS:   Stable.  CONSULTS  OBTAINED:    DRUG ALLERGIES:   Allergies  Allergen Reactions  . Fexofenadine Hcl Rash    DISCHARGE MEDICATIONS:   Allergies as of 08/23/2018      Reactions   Fexofenadine Hcl Rash      Medication List    STOP taking these medications   amLODipine 10 MG tablet Commonly known as:  NORVASC   loratadine 10 MG dissolvable tablet Commonly known as:  CLARITIN REDITABS     TAKE these medications   aspirin 81 MG chewable tablet Chew 81 mg by mouth daily.   citalopram 10 MG tablet Commonly known as:  CELEXA Take 5 mg by mouth daily.   furosemide 40 MG tablet Commonly known as:  LASIX Take 0.5 tablets (20 mg total) by mouth daily.   gabapentin 300 MG capsule Commonly known as:  NEURONTIN Take 300 mg by mouth every evening.   guaiFENesin 100 MG/5ML liquid Commonly known as:  ROBITUSSIN Take 200 mg by mouth every 4 (four) hours as needed for cough.  hydroxypropyl methylcellulose / hypromellose 2.5 % ophthalmic solution Commonly known as:  ISOPTO TEARS / GONIOVISC Place 1 drop into both eyes 3 (three) times daily as needed for dry eyes.   insulin glargine 100 UNIT/ML injection Commonly known as:  LANTUS Inject 0.27 mLs (27 Units total) into the skin at bedtime. What changed:  how much to take   levothyroxine 88 MCG tablet Commonly known as:  SYNTHROID, LEVOTHROID Take 88 mcg by mouth every evening.   lovastatin 20 MG tablet Commonly known as:  MEVACOR Take 20 mg by mouth every evening.   magnesium hydroxide 400 MG/5ML suspension Commonly known as:  MILK OF MAGNESIA Take 30 mLs by mouth 2 (two) times daily as needed for mild constipation or moderate constipation.   memantine 10 MG tablet Commonly known as:  NAMENDA Take 10 mg by mouth 2 (two) times daily.   metoCLOPramide 5 MG tablet Commonly known as:  REGLAN Take 5 mg by mouth 3 (three) times daily before meals.   metoCLOPramide 5 MG tablet Commonly known as:  REGLAN Take 1 tablet (5 mg total) by mouth  3 (three) times daily before meals for 4 days.   metoprolol succinate 25 MG 24 hr tablet Commonly known as:  TOPROL-XL Take 25 mg by mouth daily.   ondansetron 4 MG tablet Commonly known as:  ZOFRAN Take 4 mg by mouth daily as needed for nausea or vomiting.   pantoprazole 40 MG tablet Commonly known as:  PROTONIX Take 1 tablet (40 mg total) by mouth 2 (two) times daily before a meal. What changed:    medication strength  how much to take  when to take this   polyethylene glycol packet Commonly known as:  MIRALAX / GLYCOLAX Take 17 g by mouth daily.   potassium chloride 10 MEQ tablet Commonly known as:  K-DUR Take 10 mEq by mouth daily.   RICOLA CHERRY HONEY HERB 2 MG Lozg Generic drug:  Menthol Use as directed 1 lozenge in the mouth or throat every 2 (two) hours as needed (FOR COUGH).   senna-docusate 8.6-50 MG tablet Commonly known as:  Senokot-S Take 2 tablets by mouth daily.   traZODone 50 MG tablet Commonly known as:  DESYREL Take 25 mg by mouth at bedtime.   Vitamin D3 1.25 MG (50000 UT) Caps Take 1 capsule by mouth every 30 (thirty) days.        DISCHARGE INSTRUCTIONS:   Follow with PMD in 1-2 weeks.  If you experience worsening of your admission symptoms, develop shortness of breath, life threatening emergency, suicidal or homicidal thoughts you must seek medical attention immediately by calling 911 or calling your MD immediately  if symptoms less severe.  You Must read complete instructions/literature along with all the possible adverse reactions/side effects for all the Medicines you take and that have been prescribed to you. Take any new Medicines after you have completely understood and accept all the possible adverse reactions/side effects.   Please note  You were cared for by a hospitalist during your hospital stay. If you have any questions about your discharge medications or the care you received while you were in the hospital after you are  discharged, you can call the unit and asked to speak with the hospitalist on call if the hospitalist that took care of you is not available. Once you are discharged, your primary care physician will handle any further medical issues. Please note that NO REFILLS for any discharge medications will be authorized once you  are discharged, as it is imperative that you return to your primary care physician (or establish a relationship with a primary care physician if you do not have one) for your aftercare needs so that they can reassess your need for medications and monitor your lab values.    Today   CHIEF COMPLAINT:   Chief Complaint  Patient presents with  . Emesis    HISTORY OF PRESENT ILLNESS:  Izora RibasSophie Rozycki  is a 78 y.o. female with a known history of Gerd/gastritis, dementia, hypertension last EGD done in 2017 revealed acute gastritis comes [facility] the emergency room from [facility] after she had coffee ground emesis noted by staff at the facility. Patient has dementia and unable to give any history review of systems. No family in the ER. ER physician spoke with daughter on the phone earlier.  Patient otherwise is hemodynamically stable. Her hemoglobin is 11. She is being admitted for G.I. bleed and observation.  VITAL SIGNS:  Blood pressure (!) 122/43, pulse (!) 101, temperature 98.3 F (36.8 C), temperature source Oral, resp. rate 16, height 5\' 1"  (1.549 m), weight 63.5 kg, SpO2 100 %.  I/O:    Intake/Output Summary (Last 24 hours) at 08/23/2018 0935 Last data filed at 08/23/2018 0340 Gross per 24 hour  Intake 1062.47 ml  Output 1800 ml  Net -737.53 ml    PHYSICAL EXAMINATION:  GENERAL:  78 y.o.-year-old patient lying in the bed with no acute distress.  EYES: Pupils equal, round, reactive to light and accommodation. No scleral icterus. Extraocular muscles intact.  HEENT: Head atraumatic, normocephalic. Oropharynx and nasopharynx clear.  NECK:  Supple, no jugular  venous distention. No thyroid enlargement, no tenderness.  LUNGS: Normal breath sounds bilaterally, no wheezing, rales,rhonchi or crepitation. No use of accessory muscles of respiration.  CARDIOVASCULAR: S1, S2 normal. No murmurs, rubs, or gallops.  ABDOMEN: Soft, nontender, nondistended. Bowel sounds present. No organomegaly or mass.  EXTREMITIES: No pedal edema, cyanosis, or clubbing.  NEUROLOGIC: Cranial nerves II through XII are intact. Muscle strength 4/5 in all extremities. Sensation intact. Gait not checked.  PSYCHIATRIC: The patient is alert and oriented x 1-2.  SKIN: No obvious rash, lesion, or ulcer.   DATA REVIEW:   CBC Recent Labs  Lab 08/21/18 1251  08/23/18 0215  WBC 11.2*  --   --   HGB 11.3*   < > 10.5*  HCT 36.2  --   --   PLT 242  --   --    < > = values in this interval not displayed.    Chemistries  Recent Labs  Lab 08/21/18 1251  NA 140  K 4.0  CL 104  CO2 30  GLUCOSE 208*  BUN 15  CREATININE 1.07*  CALCIUM 8.7*  AST 23  ALT 12  ALKPHOS 92  BILITOT 0.5    Cardiac Enzymes No results for input(s): TROPONINI in the last 168 hours.  Microbiology Results  Results for orders placed or performed during the hospital encounter of 08/21/18  MRSA PCR Screening     Status: None   Collection Time: 08/21/18  5:58 PM  Result Value Ref Range Status   MRSA by PCR NEGATIVE NEGATIVE Final    Comment:        The GeneXpert MRSA Assay (FDA approved for NASAL specimens only), is one component of a comprehensive MRSA colonization surveillance program. It is not intended to diagnose MRSA infection nor to guide or monitor treatment for MRSA infections. Performed at [facility], [address] [place], [place] [address]     RADIOLOGY:  No results found.  EKG:   Orders placed or performed during the hospital encounter of 07/15/18  . EKG 12-Lead  . EKG 12-Lead      Management plans discussed with the patient, family and they are in  agreement.  CODE STATUS:     Code Status Orders  (From admission, onward)         Start     Ordered   08/21/18 1423  Do not attempt resuscitation/DNR  Continuous    Question Answer Comment  In the event of cardiac or respiratory ARREST Do not call a "code blue"   In the event of cardiac or respiratory ARREST Do not perform Intubation, CPR, defibrillation or ACLS   In the event of cardiac or respiratory ARREST Use medication by any route, position, wound care, and other measures to relive pain and suffering. May use oxygen, suction and manual treatment of airway obstruction as needed for comfort.      08/21/18 1422        Code Status History    Date Active Date Inactive Code Status Order ID Comments User Context   07/15/2018 1522 07/17/2018 1923 Full Code [id number]  Ihor AustinPyreddy, Pavan, MD Inpatient   03/18/2018 1145 03/20/2018 1722 Full Code [id number]  Bertrum SolSalary, Montell D, MD ED   10/16/2015 0916 10/17/2015 1938 Full Code [id number]  Hower, Cletis Athensavid K, MD ED   05/24/2015 1109 05/31/2015 1833 Full Code [id number]  Gale JourneyWalsh, Catherine P, MD Inpatient   09/23/2011 0031 09/28/2011 1933 Full Code [id number]  Garnett-Mellinger, Vennie HomansEdna Minnie, RN Inpatient      TOTAL TIME TAKING CARE OF THIS PATIENT: 35 minutes.    Altamese DillingVaibhavkumar Saabir Blyth M.D on 08/23/2018 at 9:35 AM  Between 7am to 6pm - Pager - [phone]  After 6pm go to [email or web address] - password [email or web address]  [facility]  Office  [phone]  CC: Primary care physician; Karie SchwalbeLetvak, Richard I, MD   Note: This dictation was prepared with Dragon dictation along with smaller phrase technology. Any transcriptional errors that result from this process are unintentional.

## 2018-08-24 ENCOUNTER — Encounter: Payer: Self-pay | Admitting: Gastroenterology

## 2018-08-24 DIAGNOSIS — I10 Essential (primary) hypertension: Secondary | ICD-10-CM

## 2018-08-24 DIAGNOSIS — R000 Tachycardia, unspecified: Secondary | ICD-10-CM | POA: Diagnosis not present

## 2018-08-24 DIAGNOSIS — R1110 Vomiting, unspecified: Secondary | ICD-10-CM

## 2018-08-24 DIAGNOSIS — R4182 Altered mental status, unspecified: Secondary | ICD-10-CM | POA: Diagnosis not present

## 2018-08-27 ENCOUNTER — Telehealth: Payer: Self-pay | Admitting: Family Medicine

## 2018-08-27 NOTE — Telephone Encounter (Signed)
Call from [facility]. They were unable to reach [name] by email.   States that since back from hospital [name] [name] has had more fatigue and worsened appetite. Her blood sugar this morning was 544 fasting and then recheck postprandial was 552. No acute changes today, but generally not doing well.   They do not have capacity to get bloodwork there, so I have recommended sending her out for further evaluation/labwork.

## 2018-08-29 ENCOUNTER — Ambulatory Visit: Payer: Medicare Other | Admitting: Internal Medicine

## 2018-08-29 NOTE — Telephone Encounter (Signed)
My notification system was not working I was alerted to this and called the nurse on Saturday after this

## 2018-08-29 NOTE — Telephone Encounter (Signed)
[facility] Night - Client Nonclinical Telephone Record [facility] Call [facility]  Client [facility] Night - Client Client Site [facility] - Night  Physician Tillman Abide - MD Contact Type Call Who Is Calling Physician / Provider / Hospital Call Type Provider Call [facility] Page Now Reason for Call Request to speak to Physician Initial Comment Caller states she is wanting to speak to the on call doctor regarding a resident. Additional Comment Patient Name Tress Molter Patient DOB November 13, 1940 Requesting Provider [facility] Physician Number [phone] Facility Name [facility] Comments User: Gasper Sells Date/Time [name] Time): 08/27/2018 12:50:55 PM Called [place] office to provide message. Call Closed By: Gasper Sells Transaction Date/Time: 08/27/2018 12:23:03 PM (ET)

## 2018-08-30 ENCOUNTER — Other Ambulatory Visit: Payer: Self-pay

## 2018-08-30 DIAGNOSIS — D509 Iron deficiency anemia, unspecified: Secondary | ICD-10-CM

## 2018-09-02 ENCOUNTER — Other Ambulatory Visit: Payer: Self-pay

## 2018-09-02 ENCOUNTER — Encounter: Payer: Self-pay | Admitting: Oncology

## 2018-09-02 ENCOUNTER — Inpatient Hospital Stay: Payer: Medicare Other | Attending: Oncology | Admitting: Oncology

## 2018-09-02 VITALS — BP 98/62 | HR 74 | Temp 96.3°F | Resp 18 | Ht 59.0 in | Wt 144.0 lb

## 2018-09-02 DIAGNOSIS — D500 Iron deficiency anemia secondary to blood loss (chronic): Secondary | ICD-10-CM | POA: Diagnosis not present

## 2018-09-02 DIAGNOSIS — Z7982 Long term (current) use of aspirin: Secondary | ICD-10-CM | POA: Insufficient documentation

## 2018-09-02 DIAGNOSIS — Z79899 Other long term (current) drug therapy: Secondary | ICD-10-CM | POA: Insufficient documentation

## 2018-09-02 DIAGNOSIS — F0391 Unspecified dementia with behavioral disturbance: Secondary | ICD-10-CM | POA: Insufficient documentation

## 2018-09-02 DIAGNOSIS — K2901 Acute gastritis with bleeding: Secondary | ICD-10-CM | POA: Diagnosis not present

## 2018-09-02 NOTE — Progress Notes (Signed)
Hematology/Oncology Consult note [facility] [phone] [phone]   Patient Care Team: Karie SchwalbeLetvak, Richard I, MD as PCP - General (Internal Medicine)  REFERRING PROVIDER: Dr. Allegra LaiVanga CHIEF COMPLAINTS/REASON FOR VISIT:  Evaluation of iron deficiency anemia  HISTORY OF PRESENTING ILLNESS:  [name] [name] is a  78 y.o.  female with PMH listed below who was referred to me for evaluation of iron deficiency anemia Patient has advanced dementia, not able to provide any history.  No family present.  Accompanied by caregiver from facility.  She is napping comfortably in her wheelchair not able to have a conversation with me, nonverbal.  I have to call patient's power of attorney daughter Okey RegalCarol and main history was obtained from medical chart review and conversation with Okey Regalarol.  She is a long-term [facility] resident.  Per daughter, patient was able to have simple conversation with family members at baseline and to recently she appears to be more fatigued and sleeps a lot.  Patient was admitted from 08/21/2020 08/23/2018.  Patient was sent to emergency room after coffee-ground emesis noted by staff at the facility.    She was seen and evaluated by gastroenterology Dr. Allegra LaiVanga during the hospitalization.  08/22/2018 upper endoscopy showed normal duodenal bulb and second portion of duodenum.  Multiple gastric polyps.  Medium size hiatal hernia.  Nonbleeding esophageal ulcer.  Grade B reflux esophagitis.  No specimens collected.  Review patient's medication list from nursing home.  She is on aspirin 81 mg daily, ferrous sulfate 325 mg daily with breakfast.  Protonix 40 mg twice daily.   Review of Systems  Unable to perform ROS: Dementia  Constitutional: Positive for fatigue.    MEDICAL HISTORY:  Past Medical History:  Diagnosis Date  . Allergic rhinitis   . CAD (coronary artery disease)   . Carotid artery occlusion 02/15/2009  . CVA  (cerebral infarction)   . Dementia (HCC)   . Diabetes mellitus   . Gastritis   . GERD (gastroesophageal reflux disease)   . Heart murmur   . Hematemesis   . Hemiplegia (HCC)   . HLD (hyperlipidemia)   . Hypertension   . Hypertensive chronic kidney disease   . Macular degeneration   . Persistent mood (affective) disorder, unspecified (HCC)   . Stroke (HCC)   . Thyroid disease    hypothyroidism  . Ulcer of esophagus without bleeding     SURGICAL HISTORY: Past Surgical History:  Procedure Laterality Date  . APPENDECTOMY    . ESOPHAGOGASTRODUODENOSCOPY Left 10/17/2015   Procedure: ESOPHAGOGASTRODUODENOSCOPY (EGD);  Surgeon: Scot Junobert T Elliott, MD;  [facility];  Service: Endoscopy;  Laterality: Left;  . ESOPHAGOGASTRODUODENOSCOPY N/A 08/22/2018   Procedure: ESOPHAGOGASTRODUODENOSCOPY (EGD);  Surgeon: Toney ReilVanga, Rohini Reddy, MD;  [facility];  Service: Gastroenterology;  Laterality: N/A;  . TONSILLECTOMY      SOCIAL HISTORY: Social History   Socioeconomic History  . Marital status: Divorced    Spouse name: Not on file  . Number of children: Not on file  . Years of education: Not on file  . Highest education level: Not on file  Occupational History  . Occupation: retired  Engineer, productionocial Needs  . Financial resource strain: Not on file  . Food insecurity:    Worry: Not on file    Inability: Not on file  . Transportation needs:    Medical: Not on file    Non-medical: Not on file  Tobacco Use  . Smoking status: Former Smoker  Types: Cigarettes    Last attempt to quit: 07/20/1985    Years since quitting: 33.1  . Smokeless tobacco: Never Used  Substance and Sexual Activity  . Alcohol use: No  . Drug use: No  . Sexual activity: Never    Birth control/protection: Post-menopausal  Lifestyle  . Physical activity:    Days per week: Not on file    Minutes per session: Not on file  . Stress: Not on file  Relationships  . Social connections:    Talks on phone:  Not on file    Gets together: Not on file    Attends religious service: Not on file    Active member of club or organization: Not on file    Attends meetings of clubs or organizations: Not on file    Relationship status: Not on file  . Intimate partner violence:    Fear of current or ex partner: Not on file    Emotionally abused: Not on file    Physically abused: Not on file    Forced sexual activity: Not on file  Other Topics Concern  . Not on file  Social History Narrative  . Not on file    FAMILY HISTORY: Family History  Problem Relation Age of Onset  . Diabetes Mother   . Hypertension Mother   . Diabetes Father   . Hypertension Father   . Diabetes Sister   . Hypertension Sister     ALLERGIES:  is allergic to fexofenadine hcl.  MEDICATIONS:  Current Outpatient Medications  Medication Sig Dispense Refill  . aluminum hydroxide (DERMAGRAN) ointment Apply topically every 12 (twelve) hours as needed. Skin protectant to buttocks    . aspirin 81 MG chewable tablet Chew 81 mg by mouth daily.    . Cholecalciferol (VITAMIN D3) 50000 units CAPS Take 1 capsule by mouth every 30 (thirty) days.     . citalopram (CELEXA) 10 MG tablet Take 5 mg by mouth daily.     . ferrous sulfate 325 (65 FE) MG EC tablet Take 325 mg by mouth daily with breakfast.    . furosemide (LASIX) 40 MG tablet Take 0.5 tablets (20 mg total) by mouth daily. 30 tablet 0  . gabapentin (NEURONTIN) 300 MG capsule Take 300 mg by mouth every evening.     Marland Kitchen guaiFENesin (ROBITUSSIN) 100 MG/5ML liquid Take 200 mg by mouth every 4 (four) hours as needed for cough.    . hydroxypropyl methylcellulose / hypromellose (ISOPTO TEARS / GONIOVISC) 2.5 % ophthalmic solution Place 1 drop into both eyes 3 (three) times daily as needed for dry eyes.    . insulin glargine (LANTUS) 100 UNIT/ML injection Inject 0.27 mLs (27 Units total) into the skin at bedtime. (Patient taking differently: Inject 30 Units into the skin at bedtime. ) 10  mL 11  . insulin regular (NOVOLIN R,HUMULIN R) 100 units/mL injection Inject 5 Units into the skin as needed for high blood sugar. 5 units for blood sugar over 500 twice daily    . levothyroxine (SYNTHROID, LEVOTHROID) 88 MCG tablet Take 88 mcg by mouth every evening.     . lovastatin (MEVACOR) 20 MG tablet Take 20 mg by mouth every evening.     . memantine (NAMENDA) 10 MG tablet Take 10 mg by mouth 2 (two) times daily.     . Menthol (RICOLA CHERRY HONEY HERB) 2 MG LOZG Use as directed 1 lozenge in the mouth or throat every 2 (two) hours as needed (FOR COUGH).    Marland Kitchen  metoprolol succinate (TOPROL-XL) 25 MG 24 hr tablet Take 25 mg by mouth daily.    . ondansetron (ZOFRAN) 4 MG tablet Take 4 mg by mouth daily as needed for nausea or vomiting.     . pantoprazole (PROTONIX) 40 MG tablet Take 1 tablet (40 mg total) by mouth 2 (two) times daily before a meal. 60 tablet 4  . polyethylene glycol (MIRALAX / GLYCOLAX) packet Take 17 g by mouth daily.    . potassium chloride (K-DUR) 10 MEQ tablet Take 10 mEq by mouth daily.     Marland Kitchen senna-docusate (SENOKOT-S) 8.6-50 MG tablet Take 2 tablets by mouth daily.    . traZODone (DESYREL) 50 MG tablet Take 25 mg by mouth at bedtime.     . magnesium hydroxide (MILK OF MAGNESIA) 400 MG/5ML suspension Take 30 mLs by mouth 2 (two) times daily as needed for mild constipation or moderate constipation.    . metoCLOPramide (REGLAN) 5 MG tablet Take 1 tablet (5 mg total) by mouth 3 (three) times daily before meals for 4 days. 10 tablet 0  . metoCLOPramide (REGLAN) 5 MG tablet Take 5 mg by mouth 3 (three) times daily before meals.     No current facility-administered medications for this visit.      PHYSICAL EXAMINATION: ECOG PERFORMANCE STATUS: 3 - Symptomatic, >50% confined to bed Vitals:   09/02/18 1029  BP: 98/62  Pulse: 74  Resp: 18  Temp: (!) 96.3 F (35.7 C)   Filed Weights   09/02/18 1029  Weight: 144 lb (65.3 kg)    Physical Exam Constitutional:       General: She is not in acute distress.    Appearance: She is ill-appearing.     Comments: Drowsy  HENT:     Head: Normocephalic and atraumatic.  Eyes:     Pupils: Pupils are equal, round, and reactive to light.  Cardiovascular:     Rate and Rhythm: Normal rate and regular rhythm.     Heart sounds: Normal heart sounds.  Pulmonary:     Effort: Pulmonary effort is normal. No respiratory distress.     Breath sounds: Normal breath sounds. No wheezing.  Abdominal:     Palpations: Abdomen is soft.  Musculoskeletal: Normal range of motion.        General: No deformity.  Skin:    General: Skin is warm and dry.  Neurological:     Comments: Drowsy and lethargic.  Nonverbal.  Not following commands.  Psychiatric:     Comments: Defer psychiatric evaluation due to patient's condition.     RADIOGRAPHIC STUDIES: I have personally reviewed the radiological images as listed and agreed with the findings in the report.  CMP Latest Ref Rng & Units 08/21/2018  Glucose 70 - 99 mg/dL 409(W)  BUN 8 - 23 mg/dL 15  Creatinine 1.19 - 1.47 mg/dL 8.29(F)  Sodium 621 - 308 mmol/L 140  Potassium 3.5 - 5.1 mmol/L 4.0  Chloride 98 - 111 mmol/L 104  CO2 22 - 32 mmol/L 30  Calcium 8.9 - 10.3 mg/dL 6.5(H)  Total Protein 6.5 - 8.1 g/dL 6.6  Total Bilirubin 0.3 - 1.2 mg/dL 0.5  Alkaline Phos 38 - 126 U/L 92  AST 15 - 41 U/L 23  ALT 0 - 44 U/L 12   CBC Latest Ref Rng & Units 08/23/2018  WBC 4.0 - 10.5 K/uL -  Hemoglobin 12.0 - 15.0 g/dL 10.5(L)  Hematocrit 36.0 - 46.0 % -  Platelets 150 - 400 K/uL -  LABORATORY DATA:  I have reviewed the data as listed Lab Results  Component Value Date   WBC 11.2 (H) 08/21/2018   HGB 10.5 (L) 08/23/2018   HCT 36.2 08/21/2018   MCV 77.8 (L) 08/21/2018   PLT 242 08/21/2018   Recent Labs    03/18/18 0800  03/19/18 0633  07/15/18 1050 07/16/18 0509 07/16/18 1110 08/21/18 1251  NA 141   < > 144   < > 140 147*  --  140  K 4.2   < > 4.0   < > 5.2* 4.2  --  4.0    CL 95*   < > 111   < > 105 116*  --  104  CO2 23   < > 29   < > 24 19*  --  30  GLUCOSE 587*   < > 182*   < > 395* 372* 461* 208*  BUN 25*   < > 35*   < > 16 18  --  15  CREATININE 1.61*   < > 2.03*   < > 1.20* 1.40*  --  1.07*  CALCIUM 9.4   < > 8.7*   < > 9.0 8.8*  --  8.7*  GFRNONAA 30*   < > 22*   < > 43* 36*  --  50*  GFRAA 34*   < > 26*   < > 50* 42*  --  58*  PROT 8.1  --  6.1*  --   --   --   --  6.6  ALBUMIN 3.9  --  3.0*  --   --   --   --  3.3*  AST 28  --  21  --   --   --   --  23  ALT 16  --  12  --   --   --   --  12  ALKPHOS 128*  --  81  --   --   --   --  92  BILITOT 1.3*  --  0.5  --   --   --   --  0.5   < > = values in this interval not displayed.   Iron/TIBC/Ferritin/ %Sat    Component Value Date/Time   IRON 23 (L) 08/22/2018 1825   TIBC 359 08/22/2018 1825   FERRITIN 12 08/22/2018 1825   IRONPCTSAT 6 (L) 08/22/2018 1825        ASSESSMENT & PLAN:  1. Iron deficiency anemia due to chronic blood loss   2. Acute gastritis with hemorrhage, unspecified gastritis type   3. Dementia with behavioral disturbance, unspecified dementia type (HCC)    Labs reviewed and discussed with patient's daughter over the phone. Ferritin panel showed iron saturation 6, ferritin 12. Last hemoglobin was 10.5. Plan IV iron with Venofer 200mg  weekly x 4 doses. Allergy reactions/infusion reaction including anaphylactic reaction discussed with patient. Other side effects include but not limited to high blood pressure, skin rash, weight gain, leg swelling, etc. patient's daughter voices understanding and willing to proceed.  Gastritis with upper GI bleeding, continue Protonix 40 mg twice daily.   Orders Placed This Encounter  Procedures  . CBC with Differential/Platelet    Standing Status:   Future    Standing Expiration Date:   09/03/2019  . Ferritin    Standing Status:   Future    Standing Expiration Date:   09/03/2019  . Iron and TIBC    Standing Status:   Future  Standing Expiration Date:   09/03/2019    All questions were answered. The patient knows to call the clinic with any problems questions or concerns. CC Dr. Allegra Lai [name] Return of visit: 8 weeks with repeat labs CBC, iron TIBC ferritin, prior to the visit,, +/-benefit. Thank you for this kind referral and the opportunity to participate in the care of this patient. A copy of today's note is routed to referring provider  Total face to face encounter time for this patient visit was 45 min. >50% of the time was  spent in counseling and coordination of care.    Rickard Patience, MD, PhD Hematology Oncology [facility] at [facility] Pager- [phone] 09/02/2018

## 2018-09-02 NOTE — Progress Notes (Signed)
Patient her for initial visit. She lived at [facility] and is accompanied by caregiver today, no family present. Med rec and history done with facility MAR. Patient shakes head to say yes or no, but was nonverbal during assessment. Pt unable to answer to any additional family history.

## 2018-09-03 ENCOUNTER — Other Ambulatory Visit: Payer: Self-pay

## 2018-09-03 ENCOUNTER — Encounter: Payer: Self-pay | Admitting: Emergency Medicine

## 2018-09-03 ENCOUNTER — Inpatient Hospital Stay
Admission: EM | Admit: 2018-09-03 | Discharge: 2018-09-05 | DRG: 682 | Disposition: A | Discharge status: Skilled Nursing Facility | Payer: Medicare Other | Source: Skilled Nursing Facility | Attending: Internal Medicine | Admitting: Internal Medicine

## 2018-09-03 ENCOUNTER — Emergency Department: Payer: Medicare Other

## 2018-09-03 ENCOUNTER — Inpatient Hospital Stay: Payer: Medicare Other

## 2018-09-03 DIAGNOSIS — E785 Hyperlipidemia, unspecified: Secondary | ICD-10-CM | POA: Diagnosis present

## 2018-09-03 DIAGNOSIS — Z87891 Personal history of nicotine dependence: Secondary | ICD-10-CM | POA: Diagnosis not present

## 2018-09-03 DIAGNOSIS — N179 Acute kidney failure, unspecified: Secondary | ICD-10-CM | POA: Diagnosis present

## 2018-09-03 DIAGNOSIS — G9341 Metabolic encephalopathy: Secondary | ICD-10-CM | POA: Diagnosis present

## 2018-09-03 DIAGNOSIS — I959 Hypotension, unspecified: Secondary | ICD-10-CM | POA: Diagnosis present

## 2018-09-03 DIAGNOSIS — E860 Dehydration: Secondary | ICD-10-CM | POA: Diagnosis present

## 2018-09-03 DIAGNOSIS — E039 Hypothyroidism, unspecified: Secondary | ICD-10-CM | POA: Diagnosis present

## 2018-09-03 DIAGNOSIS — E119 Type 2 diabetes mellitus without complications: Secondary | ICD-10-CM | POA: Diagnosis present

## 2018-09-03 DIAGNOSIS — D509 Iron deficiency anemia, unspecified: Secondary | ICD-10-CM | POA: Diagnosis present

## 2018-09-03 DIAGNOSIS — I10 Essential (primary) hypertension: Secondary | ICD-10-CM | POA: Diagnosis present

## 2018-09-03 DIAGNOSIS — F0390 Unspecified dementia without behavioral disturbance: Secondary | ICD-10-CM | POA: Diagnosis present

## 2018-09-03 DIAGNOSIS — Z79890 Hormone replacement therapy (postmenopausal): Secondary | ICD-10-CM | POA: Diagnosis not present

## 2018-09-03 DIAGNOSIS — Z8249 Family history of ischemic heart disease and other diseases of the circulatory system: Secondary | ICD-10-CM | POA: Diagnosis not present

## 2018-09-03 DIAGNOSIS — Z833 Family history of diabetes mellitus: Secondary | ICD-10-CM | POA: Diagnosis not present

## 2018-09-03 DIAGNOSIS — Z66 Do not resuscitate: Secondary | ICD-10-CM | POA: Diagnosis present

## 2018-09-03 DIAGNOSIS — I69359 Hemiplegia and hemiparesis following cerebral infarction affecting unspecified side: Secondary | ICD-10-CM | POA: Diagnosis not present

## 2018-09-03 DIAGNOSIS — Z7982 Long term (current) use of aspirin: Secondary | ICD-10-CM

## 2018-09-03 DIAGNOSIS — N289 Disorder of kidney and ureter, unspecified: Secondary | ICD-10-CM | POA: Diagnosis present

## 2018-09-03 DIAGNOSIS — Z794 Long term (current) use of insulin: Secondary | ICD-10-CM

## 2018-09-03 DIAGNOSIS — L8990 Pressure ulcer of unspecified site, unspecified stage: Secondary | ICD-10-CM

## 2018-09-03 LAB — URINALYSIS, COMPLETE (UACMP) WITH MICROSCOPIC
Bilirubin Urine: NEGATIVE
Glucose, UA: NEGATIVE mg/dL
Ketones, ur: NEGATIVE mg/dL
NITRITE: NEGATIVE
Protein, ur: NEGATIVE mg/dL
Specific Gravity, Urine: 1.012 (ref 1.005–1.030)
WBC, UA: >50 WBC/hpf — ABNORMAL HIGH (ref 0–5)
pH: 5 (ref 5.0–8.0)

## 2018-09-03 LAB — BASIC METABOLIC PANEL
Anion gap: 9 (ref 5–15)
BUN: 39 mg/dL — ABNORMAL HIGH (ref 8–23)
CO2: 26 mmol/L (ref 22–32)
Calcium: 9.3 mg/dL (ref 8.9–10.3)
Chloride: 103 mmol/L (ref 98–111)
Creatinine, Ser: 2.48 mg/dL — ABNORMAL HIGH (ref 0.44–1.00)
GFR calc Af Amer: 21 mL/min — ABNORMAL LOW (ref >60)
GFR calc non Af Amer: 18 mL/min — ABNORMAL LOW (ref >60)
Glucose, Bld: 87 mg/dL (ref 70–99)
POTASSIUM: 4 mmol/L (ref 3.5–5.1)
Sodium: 138 mmol/L (ref 135–145)

## 2018-09-03 LAB — CBC
HCT: 38.6 % (ref 36.0–46.0)
HEMOGLOBIN: 12 g/dL (ref 12.0–15.0)
MCH: 24.2 pg — AB (ref 26.0–34.0)
MCHC: 31.1 g/dL (ref 30.0–36.0)
MCV: 78 fL — ABNORMAL LOW (ref 80.0–100.0)
Platelets: 337 10*3/uL (ref 150–400)
RBC: 4.95 MIL/uL (ref 3.87–5.11)
RDW: 17.6 % — ABNORMAL HIGH (ref 11.5–15.5)
WBC: 8.6 10*3/uL (ref 4.0–10.5)
nRBC: 0 % (ref 0.0–0.2)

## 2018-09-03 LAB — TSH: TSH: 2.923 u[IU]/mL (ref 0.350–4.500)

## 2018-09-03 LAB — LACTIC ACID, PLASMA: Lactic Acid, Venous: 1.2 mmol/L (ref 0.5–1.9)

## 2018-09-03 LAB — GLUCOSE, CAPILLARY
Glucose-Capillary: 134 mg/dL — ABNORMAL HIGH (ref 70–99)
Glucose-Capillary: 193 mg/dL — ABNORMAL HIGH (ref 70–99)

## 2018-09-03 MED ORDER — POLYETHYLENE GLYCOL 3350 17 G PO PACK: 17.0000 g | PACK | Freq: Every day | ORAL | Status: DC

## 2018-09-03 MED ORDER — ACETAMINOPHEN 650 MG RE SUPP: 650.0000 mg | Freq: Four times a day (QID) | RECTAL | Status: DC | PRN

## 2018-09-03 MED ORDER — INSULIN ASPART 100 UNIT/ML ~~LOC~~ SOLN
0.0000 [IU] | Freq: Three times a day (TID) | SUBCUTANEOUS | Status: DC
  Administered 2018-09-03: 18:00:00 1 [IU] via SUBCUTANEOUS
  Administered 2018-09-04: 18:00:00 3 [IU] via SUBCUTANEOUS
  Administered 2018-09-04: 12:00:00 1 [IU] via SUBCUTANEOUS
  Filled 2018-09-03: qty 1

## 2018-09-03 MED ORDER — VITAMIN D3 1.25 MG (50000 UT) PO CAPS
1.0000 | ORAL_CAPSULE | ORAL | Status: DC
  Filled 2018-09-03: qty 1.25

## 2018-09-03 MED ORDER — ONDANSETRON HCL 4 MG PO TABS: 4.0000 mg | ORAL_TABLET | Freq: Four times a day (QID) | ORAL | Status: DC | PRN

## 2018-09-03 MED ORDER — TRAZODONE HCL 50 MG PO TABS
25.0000 mg | ORAL_TABLET | Freq: Every day | ORAL | Status: DC
  Administered 2018-09-03 – 2018-09-04 (×2): 25 mg via ORAL
  Filled 2018-09-03 (×2): qty 1

## 2018-09-03 MED ORDER — ONDANSETRON HCL 4 MG PO TABS: 4.0000 mg | ORAL_TABLET | Freq: Every day | ORAL | Status: DC | PRN

## 2018-09-03 MED ORDER — MEMANTINE HCL 5 MG PO TABS
5.0000 mg | ORAL_TABLET | Freq: Two times a day (BID) | ORAL | Status: DC
  Administered 2018-09-03 – 2018-09-05 (×4): 5 mg via ORAL
  Filled 2018-09-03 (×4): qty 1

## 2018-09-03 MED ORDER — LEVOTHYROXINE SODIUM 88 MCG PO TABS
88.0000 ug | ORAL_TABLET | Freq: Every day | ORAL | Status: DC
  Administered 2018-09-04 – 2018-09-05 (×2): 88 ug via ORAL
  Filled 2018-09-03 (×2): qty 1

## 2018-09-03 MED ORDER — ASPIRIN 81 MG PO CHEW
81.0000 mg | CHEWABLE_TABLET | Freq: Every day | ORAL | Status: DC
  Administered 2018-09-04 – 2018-09-05 (×2): 81 mg via ORAL
  Filled 2018-09-03 (×2): qty 1

## 2018-09-03 MED ORDER — PANTOPRAZOLE SODIUM 40 MG PO TBEC
40.0000 mg | DELAYED_RELEASE_TABLET | Freq: Two times a day (BID) | ORAL | Status: DC
  Administered 2018-09-04 – 2018-09-05 (×3): 40 mg via ORAL
  Filled 2018-09-03 (×3): qty 1

## 2018-09-03 MED ORDER — ONDANSETRON HCL 4 MG/2ML IJ SOLN: 4.0000 mg | Freq: Four times a day (QID) | INTRAMUSCULAR | Status: DC | PRN

## 2018-09-03 MED ORDER — ALBUTEROL SULFATE (2.5 MG/3ML) 0.083% IN NEBU
2.5000 mg | INHALATION_SOLUTION | Freq: Four times a day (QID) | RESPIRATORY_TRACT | Status: DC
  Administered 2018-09-03 (×2): 2.5 mg via RESPIRATORY_TRACT
  Filled 2018-09-03: qty 3

## 2018-09-03 MED ORDER — ACETAMINOPHEN 325 MG PO TABS: 650.0000 mg | ORAL_TABLET | Freq: Four times a day (QID) | ORAL | Status: DC | PRN

## 2018-09-03 MED ORDER — SODIUM CHLORIDE 0.9 % IV BOLUS
500.0000 mL | Freq: Once | INTRAVENOUS | Status: AC
  Administered 2018-09-03: 500 mL via INTRAVENOUS

## 2018-09-03 MED ORDER — LEVOTHYROXINE SODIUM 88 MCG PO TABS
88.0000 ug | ORAL_TABLET | Freq: Every evening | ORAL | Status: DC
  Filled 2018-09-03: qty 1

## 2018-09-03 MED ORDER — MENTHOL 3 MG MT LOZG
1.0000 | LOZENGE | OROMUCOSAL | Status: DC | PRN
  Filled 2018-09-03: qty 9

## 2018-09-03 MED ORDER — POTASSIUM CHLORIDE ER 10 MEQ PO TBCR
10.0000 meq | EXTENDED_RELEASE_TABLET | Freq: Every day | ORAL | Status: DC
  Administered 2018-09-04 – 2018-09-05 (×2): 10 meq via ORAL
  Filled 2018-09-03 (×4): qty 1

## 2018-09-03 MED ORDER — SODIUM CHLORIDE 0.9% FLUSH: 3.0000 mL | Freq: Once | INTRAVENOUS | Status: DC

## 2018-09-03 MED ORDER — MEMANTINE HCL 5 MG PO TABS: 10.0000 mg | ORAL_TABLET | Freq: Two times a day (BID) | ORAL | Status: DC

## 2018-09-03 MED ORDER — SODIUM CHLORIDE 0.9 % IV SOLN
INTRAVENOUS | Status: DC
  Administered 2018-09-03 – 2018-09-05 (×3): via INTRAVENOUS

## 2018-09-03 MED ORDER — INSULIN GLARGINE 100 UNIT/ML ~~LOC~~ SOLN
20.0000 [IU] | Freq: Every day | SUBCUTANEOUS | Status: DC
  Administered 2018-09-03: 20 [IU] via SUBCUTANEOUS
  Filled 2018-09-03 (×3): qty 0.2

## 2018-09-03 MED ORDER — ALUMINUM HYDROXIDE EX OINT: TOPICAL_OINTMENT | Freq: Four times a day (QID) | CUTANEOUS | Status: DC | PRN

## 2018-09-03 MED ORDER — PRAVASTATIN SODIUM 20 MG PO TABS
80.0000 mg | ORAL_TABLET | Freq: Every day | ORAL | Status: DC
  Administered 2018-09-03 – 2018-09-04 (×2): 80 mg via ORAL
  Filled 2018-09-03: qty 4

## 2018-09-03 MED ORDER — INSULIN ASPART 100 UNIT/ML ~~LOC~~ SOLN: 0.0000 [IU] | Freq: Every day | SUBCUTANEOUS | Status: DC

## 2018-09-03 MED ORDER — SENNOSIDES-DOCUSATE SODIUM 8.6-50 MG PO TABS
2.0000 | ORAL_TABLET | Freq: Every day | ORAL | Status: DC
  Administered 2018-09-04 – 2018-09-05 (×2): 2 via ORAL
  Filled 2018-09-03 (×2): qty 2

## 2018-09-03 MED ORDER — METOCLOPRAMIDE HCL 5 MG PO TABS
5.0000 mg | ORAL_TABLET | Freq: Three times a day (TID) | ORAL | Status: DC
  Administered 2018-09-03 – 2018-09-05 (×4): 5 mg via ORAL
  Filled 2018-09-03 (×2): qty 1

## 2018-09-03 MED ORDER — HYDRALAZINE HCL 20 MG/ML IJ SOLN: 10.0000 mg | INTRAMUSCULAR | Status: DC | PRN

## 2018-09-03 MED ORDER — HEPARIN SODIUM (PORCINE) 5000 UNIT/ML IJ SOLN
5000.0000 [IU] | Freq: Three times a day (TID) | INTRAMUSCULAR | Status: DC
  Administered 2018-09-03 – 2018-09-05 (×5): 5000 [IU] via SUBCUTANEOUS
  Filled 2018-09-03 (×6): qty 1

## 2018-09-03 MED ORDER — POLYVINYL ALCOHOL 1.4 % OP SOLN
1.0000 [drp] | Freq: Three times a day (TID) | OPHTHALMIC | Status: DC | PRN
  Filled 2018-09-03: qty 15

## 2018-09-03 NOTE — ED Notes (Signed)
ED TO INPATIENT HANDOFF REPORT  ED Nurse Name and Phone #: Victorino Dike [phone]  S Name/Age/Gender [name] [name] 78 y.o. female Room/Bed: ED05A/ED05A  Code Status   Code Status: Prior  Home/SNF/Other Skilled nursing facility Patient oriented to: self and situation Is this baseline? Yes   Triage Complete: Triage complete  Chief Complaint Unresponsive?  Triage Note PT to ER via EMS from [facility] with reports of unresponsiveness.  EMS reports that pt was in a recliner and facility stood her up and she became unresponsive.  Facility reports blood sugar lower than normal, but that pt has not eaten breakfast.  Pt arrives alert and responsive.     Allergies Allergies  Allergen Reactions  . Fexofenadine Hcl Rash    Level of Care/Admitting Diagnosis ED Disposition    ED Disposition Condition Comment   Admit  Hospital Area: [facility] [[id number]]  Level of Care: Med-Surg [16]  Diagnosis: ARF (acute renal failure) [facility]) [[id number]]  Admitting Physician: Bertrum Sol [[id number]]  Attending Physician: Bertrum Sol [[id number]]  Estimated length of stay: past midnight tomorrow  Certification:: I certify this patient will need inpatient services for at least 2 midnights  PT Class (Do Not Modify): Inpatient [101]  PT Acc Code (Do Not Modify): Private [1]       B Medical/Surgery History Past Medical History:  Diagnosis Date  . Allergic rhinitis   . CAD (coronary artery disease)   . Carotid artery occlusion 02/15/2009  . CVA (cerebral infarction)   . Dementia (HCC)   . Diabetes mellitus   . Gastritis   . GERD (gastroesophageal reflux disease)   . Heart murmur   . Hematemesis   . Hemiplegia (HCC)   . HLD (hyperlipidemia)   . Hypertension   . Hypertensive chronic kidney disease   . Macular degeneration   . Persistent mood (affective) disorder, unspecified (HCC)   . Stroke (HCC)   . Thyroid disease    hypothyroidism  . Ulcer of esophagus without  bleeding    Past Surgical History:  Procedure Laterality Date  . APPENDECTOMY    . ESOPHAGOGASTRODUODENOSCOPY Left 10/17/2015   Procedure: ESOPHAGOGASTRODUODENOSCOPY (EGD);  Surgeon: Scot Jun, MD;  Location: [facility] ENDOSCOPY;  Service: Endoscopy;  Laterality: Left;  . ESOPHAGOGASTRODUODENOSCOPY N/A 08/22/2018   Procedure: ESOPHAGOGASTRODUODENOSCOPY (EGD);  Surgeon: [name], MD;  Location: [facility] ENDOSCOPY;  Service: Gastroenterology;  Laterality: N/A;  . TONSILLECTOMY       A IV Location/Drains/Wounds Patient Lines/Drains/Airways Status   Active Line/Drains/Airways    Name:   Placement date:   Placement time:   Site:   Days:   Peripheral IV 09/03/18 Right Antecubital   09/03/18    1022    Antecubital   less than 1   Peripheral IV 09/03/18 Left Antecubital   09/03/18    1032    Antecubital   less than 1   External Urinary Catheter   07/15/18    1300    -   50   External Urinary Catheter   08/21/18    2222    -   13   Pressure Injury 07/16/18 Deep Tissue Injury - Purple or maroon localized area of discolored intact skin or blood-filled blister due to damage of underlying soft tissue from pressure and/or shear. blanchable at bottom of heel   07/16/18    0459     49          Intake/Output Last 24 hours No intake or output  data in the 24 hours ending 09/03/18 1406  Labs/Imaging Results for orders placed or performed during the hospital encounter of 09/03/18 (from the past 48 hour(s))  Basic metabolic panel     Status: Abnormal   Collection Time: 09/03/18 10:33 AM  Result Value Ref Range   Sodium 138 135 - 145 mmol/L   Potassium 4.0 3.5 - 5.1 mmol/L   Chloride 103 98 - 111 mmol/L   CO2 26 22 - 32 mmol/L   Glucose, Bld 87 70 - 99 mg/dL   BUN 39 (H) 8 - 23 mg/dL   Creatinine, Ser 1.61 (H) 0.44 - 1.00 mg/dL   Calcium 9.3 8.9 - 09.6 mg/dL   GFR calc non Af Amer 18 (L) >60 mL/min   GFR calc Af Amer 21 (L) >60 mL/min   Anion gap 9 5 - 15    Comment: Performed at  [facility], [address] [place], [place] [address]  CBC     Status: Abnormal   Collection Time: 09/03/18 10:33 AM  Result Value Ref Range   WBC 8.6 4.0 - 10.5 K/uL   RBC 4.95 3.87 - 5.11 MIL/uL   Hemoglobin 12.0 12.0 - 15.0 g/dL   HCT 98.1 19.1 - 47.8 %   MCV 78.0 (L) 80.0 - 100.0 fL   MCH 24.2 (L) 26.0 - 34.0 pg   MCHC 31.1 30.0 - 36.0 g/dL   RDW 29.5 (H) 62.1 - 30.8 %   Platelets 337 150 - 400 K/uL   nRBC 0.0 0.0 - 0.2 %    Comment: Performed at [facility], [address] [place], [place] [address]  Lactic acid, plasma     Status: None   Collection Time: 09/03/18 11:50 AM  Result Value Ref Range   Lactic Acid, Venous 1.2 0.5 - 1.9 mmol/L    Comment: Performed at [facility], [address] [place], [place] [address]  Urinalysis, Complete w Microscopic     Status: Abnormal   Collection Time: 09/03/18  1:36 PM  Result Value Ref Range   Color, Urine YELLOW (A) YELLOW   APPearance CLOUDY (A) CLEAR   Specific Gravity, Urine 1.012 1.005 - 1.030   pH 5.0 5.0 - 8.0   Glucose, UA NEGATIVE NEGATIVE mg/dL   Hgb urine dipstick SMALL (A) NEGATIVE   Bilirubin Urine NEGATIVE NEGATIVE   Ketones, ur NEGATIVE NEGATIVE mg/dL   Protein, ur NEGATIVE NEGATIVE mg/dL   Nitrite NEGATIVE NEGATIVE   Leukocytes,Ua MODERATE (A) NEGATIVE   RBC / HPF 0-5 0 - 5 RBC/hpf   WBC, UA >50 (H) 0 - 5 WBC/hpf   Bacteria, UA MANY (A) NONE SEEN   Squamous Epithelial / LPF 0-5 0 - 5   WBC Clumps PRESENT    Mucus PRESENT    Hyaline Casts, UA PRESENT     Comment: Performed at [facility], [address] [place], [place] [address]   US Venous Img Lower Unilateral Right  Result Date: 09/03/2018 CLINICAL DATA:  Right lower extremity pain and edema. Evaluate for DVT. EXAM: RIGHT LOWER EXTREMITY VENOUS DOPPLER ULTRASOUND TECHNIQUE: Gray-scale sonography with graded compression, as well as color Doppler and duplex ultrasound were performed to evaluate the lower  extremity deep venous systems from the level of the common femoral vein and including the common femoral, femoral, profunda femoral, popliteal and calf veins including the posterior tibial, peroneal and gastrocnemius veins when visible. The superficial great saphenous vein was also interrogated. Spectral Doppler was utilized to  evaluate flow at rest and with distal augmentation maneuvers in the common femoral, femoral and popliteal veins. COMPARISON:  None. FINDINGS: Contralateral Common Femoral Vein: Respiratory phasicity is normal and symmetric with the symptomatic side. No evidence of thrombus. Normal compressibility. Common Femoral Vein: No evidence of thrombus. Normal compressibility, respiratory phasicity and response to augmentation. Saphenofemoral Junction: No evidence of thrombus. Normal compressibility and flow on color Doppler imaging. Profunda Femoral Vein: No evidence of thrombus. Normal compressibility and flow on color Doppler imaging. Femoral Vein: There is a very minimal amount nonocclusive echogenic wall thickening/mural calcification involving the proximal aspect of the right superficial femoral vein. No evidence of thrombus. Normal compressibility, respiratory phasicity and response to augmentation. Popliteal Vein: No evidence of thrombus. Normal compressibility, respiratory phasicity and response to augmentation. Calf Veins: No evidence of thrombus. Normal compressibility and flow on color Doppler imaging. Superficial Great Saphenous Vein: No evidence of thrombus. Normal compressibility. Venous Reflux:  None. Other Findings:  None. IMPRESSION: 1. No evidence of acute DVT within the right lower extremity. 2. Minimal amount of nonocclusive echogenic wall thickening/mural calcification involving the proximal aspect of the right superficial femoral vein, nonspecific though potentially the sequela of prior DVT. Electronically Signed   By: Simonne Come M.D.   On: 09/03/2018 12:21   Dg Chest Portable  1 View  Result Date: 09/03/2018 CLINICAL DATA:  Unresponsiveness. EXAM: PORTABLE CHEST 1 VIEW COMPARISON:  07/15/2018; 03/18/2018; 05/24/2015 FINDINGS: Grossly unchanged cardiac silhouette and mediastinal contours with atherosclerotic plaque when thoracic aorta. Veiling opacities overlying the bilateral lower lungs are favored to represent overlying soft tissues. Minimal left basilar opacities favored to represent atelectasis, unchanged. No new focal airspace opacities. No pleural effusion or pneumothorax. No evidence of edema. Vascular calcifications overlie the lung apices bilaterally. No acute osseous abnormalities. A peripheral IV overlies the right antecubital fossa. IMPRESSION: 1. No acute cardiopulmonary disease on this AP portable examination. 2.  Aortic Atherosclerosis (ICD10-I70.0). Electronically Signed   By: Simonne Come M.D.   On: 09/03/2018 12:27    Pending Labs Unresulted Labs (From admission, onward)    Start     Ordered   09/03/18 1338  TSH  Add-on,   AD     09/03/18 1338   Signed and Held  CBC  (heparin)  Once,   R    Comments:  Baseline for heparin therapy IF NOT ALREADY DRAWN.  Notify MD if PLT < 100 K.    Signed and Held   Signed and Held  Creatinine, serum  (heparin)  Once,   R    Comments:  Baseline for heparin therapy IF NOT ALREADY DRAWN.    Signed and Held   Signed and Held  Basic metabolic panel  Tomorrow morning,   R     Signed and Held          Vitals/Pain Today's Vitals   09/03/18 1027 09/03/18 1200 09/03/18 1330 09/03/18 1332  BP:  (!) 114/59 97/69   Pulse:  75  81  Resp:  11 16 14   Temp:      SpO2:  100%  100%  Weight: 65 kg     Height: 4\' 11"  (1.499 m)       Isolation Precautions No active isolations  Medications Medications  sodium chloride flush (NS) 0.9 % injection 3 mL (has no administration in time range)  sodium chloride 0.9 % bolus 500 mL (500 mLs Intravenous New Bag/Given 09/03/18 1306)  sodium chloride 0.9 % bolus 500 mL (0 mLs  Intravenous Stopped 09/03/18 1305)    Mobility walks with person assist     Focused Assessments    R Recommendations: See Admitting Provider Note  Report given to:   Additional Notes:

## 2018-09-03 NOTE — ED Triage Notes (Signed)
PT to ER via EMS from [facility] with reports of unresponsiveness.  EMS reports that pt was in a recliner and facility stood her up and she became unresponsive.  Facility reports blood sugar lower than normal, but that pt has not eaten breakfast.  Pt arrives alert and responsive.

## 2018-09-03 NOTE — H&P (Signed)
[employer] [facility] [place] [facility]   PATIENT NAME: [name] [name]    MR#:  [id number]  DATE OF BIRTH:  1941-02-09  DATE OF ADMISSION:  09/03/2018  PRIMARY CARE PHYSICIAN: Karie Schwalbe, MD   REQUESTING/REFERRING PHYSICIAN:   CHIEF COMPLAINT:   Chief Complaint  Patient presents with  . Weakness    HISTORY OF PRESENT ILLNESS: [name] [name]  is a 78 y.o. female with a known history per below was recently in the hospital for GI bleeding, sent to inpatient rehab, presenting from rehab facility with acute unresponsive state, noted to have blood pressure in the 80s, did respond with IV fluid challenge in the emergency room, ER work-up noted for creatinine of 2.4 baseline normal, hemoglobin was 12, patient also with complaint of right leg swelling-lower extremity Doppler done/negative study for DVT, chest x-ray did not show any acute process, per ED attending stated patient back to her neurological baseline per family, patient evaluated in the emergency room, no family present, patient is awake alert oriented x3, no apparent distress, resting comfortably in bed, patient is now been admitted for acute kidney injury/renal failure, acute encephalopathy which has resolved, and hypotension.  PAST MEDICAL HISTORY:   Past Medical History:  Diagnosis Date  . Allergic rhinitis   . CAD (coronary artery disease)   . Carotid artery occlusion 02/15/2009  . CVA (cerebral infarction)   . Dementia (HCC)   . Diabetes mellitus   . Gastritis   . GERD (gastroesophageal reflux disease)   . Heart murmur   . Hematemesis   . Hemiplegia (HCC)   . HLD (hyperlipidemia)   . Hypertension   . Hypertensive chronic kidney disease   . Macular degeneration   . Persistent mood (affective) disorder, unspecified (HCC)   . Stroke (HCC)   . Thyroid disease    hypothyroidism  . Ulcer of esophagus without bleeding     PAST SURGICAL HISTORY:  Past Surgical History:  Procedure Laterality Date  .  APPENDECTOMY    . ESOPHAGOGASTRODUODENOSCOPY Left 10/17/2015   Procedure: ESOPHAGOGASTRODUODENOSCOPY (EGD);  Surgeon: Scot Jun, MD;  Location: [facility] ENDOSCOPY;  Service: Endoscopy;  Laterality: Left;  . ESOPHAGOGASTRODUODENOSCOPY N/A 08/22/2018   Procedure: ESOPHAGOGASTRODUODENOSCOPY (EGD);  Surgeon: [name], MD;  Location: [facility] ENDOSCOPY;  Service: Gastroenterology;  Laterality: N/A;  . TONSILLECTOMY      SOCIAL HISTORY:  Social History   Tobacco Use  . Smoking status: Former Smoker    Types: Cigarettes    Last attempt to quit: 07/20/1985    Years since quitting: 33.1  . Smokeless tobacco: Never Used  Substance Use Topics  . Alcohol use: No    FAMILY HISTORY:  Family History  Problem Relation Age of Onset  . Diabetes Mother   . Hypertension Mother   . Diabetes Father   . Hypertension Father   . Diabetes Sister   . Hypertension Sister     DRUG ALLERGIES:  Allergies  Allergen Reactions  . Fexofenadine Hcl Rash    REVIEW OF SYSTEMS: Limited due to retrograde amnesia  CONSTITUTIONAL: No fever, fatigue or weakness.  EYES: No blurred or double vision.  EARS, NOSE, AND THROAT: No tinnitus or ear pain.  RESPIRATORY: No cough, shortness of breath, wheezing or hemoptysis.  CARDIOVASCULAR: No chest pain, orthopnea,+ right leg swelling  GASTROINTESTINAL: No nausea, vomiting, diarrhea or abdominal pain.  GENITOURINARY: No dysuria, hematuria.  ENDOCRINE: No polyuria, nocturia,  HEMATOLOGY: No anemia, easy bruising or bleeding SKIN: No rash or lesion.  MUSCULOSKELETAL: No joint pain or arthritis.   NEUROLOGIC: No tingling, numbness, weakness.  PSYCHIATRY: No anxiety or depression.   MEDICATIONS AT HOME:  Prior to Admission medications   Medication Sig Start Date End Date Taking? Authorizing Provider  aluminum hydroxide [facility](DERMAGRAN) ointment Apply topically every 12 (twelve) hours as needed. Skin protectant to buttocks   Yes [provider]  amLODipine  (NORVASC) 10 MG tablet Take 10 mg by mouth daily.   Yes [provider]  aspirin 81 MG chewable tablet Chew 81 mg by mouth daily.   Yes [provider]  Cholecalciferol (VITAMIN D3) 50000 units CAPS Take 1 capsule by mouth every 30 (thirty) days.    Yes [provider]  citalopram (CELEXA) 10 MG tablet Take 5 mg by mouth daily.    Yes [provider]  furosemide (LASIX) 40 MG tablet Take 0.5 tablets (20 mg total) by mouth daily. 05/29/15  Yes Gouru, Deanna ArtisAruna, MD  gabapentin (NEURONTIN) 300 MG capsule Take 300 mg by mouth every evening.    Yes [provider]  insulin glargine (LANTUS) 100 UNIT/ML injection Inject 0.27 mLs (27 Units total) into the skin at bedtime. Patient taking differently: Inject 20 Units into the skin at bedtime.  05/31/15  Yes Gouru, Deanna ArtisAruna, MD  levothyroxine (SYNTHROID, LEVOTHROID) 88 MCG tablet Take 88 mcg by mouth every evening.    Yes [provider]  lovastatin (MEVACOR) 20 MG tablet Take 20 mg by mouth every evening.    Yes [provider]  memantine (NAMENDA) 10 MG tablet Take 10 mg by mouth 2 (two) times daily.   Yes [provider]  metoprolol succinate (TOPROL-XL) 25 MG 24 hr tablet Take 25 mg by mouth daily.   Yes [provider]  pantoprazole (PROTONIX) 40 MG tablet Take 1 tablet (40 mg total) by mouth 2 (two) times daily before a meal. 08/23/18  Yes Altamese DillingVachhani, Vaibhavkumar, MD  polyethylene glycol (MIRALAX / GLYCOLAX) packet Take 17 g by mouth daily.   Yes [provider]  potassium chloride (K-DUR) 10 MEQ tablet Take 10 mEq by mouth daily.    Yes [provider]  senna-docusate (SENOKOT-S) 8.6-50 MG tablet Take 2 tablets by mouth daily.   Yes [provider]  traZODone (DESYREL) 50 MG tablet Take 25 mg by mouth at bedtime.    Yes [provider]  guaiFENesin (ROBITUSSIN) 100 MG/5ML liquid Take 200 mg by mouth every 4 (four) hours as needed for cough.     [provider]  hydroxypropyl methylcellulose / hypromellose (ISOPTO TEARS / GONIOVISC) 2.5 % ophthalmic solution Place 1 drop into both eyes 3 (three) times daily as needed for dry eyes.    [provider]  Menthol (RICOLA CHERRY HONEY HERB) 2 MG LOZG Use as directed 1 lozenge in the mouth or throat every 2 (two) hours as needed (FOR COUGH).    [provider]  metoCLOPramide (REGLAN) 5 MG tablet Take 1 tablet (5 mg total) by mouth 3 (three) times daily before meals for 4 days. 07/17/18 08/21/18  Milagros LollSudini, Srikar, MD  ondansetron (ZOFRAN) 4 MG tablet Take 4 mg by mouth daily as needed for nausea or vomiting.     [provider]      PHYSICAL EXAMINATION:   VITAL SIGNS: Blood pressure (!) 114/59, pulse 75, temperature 97.7 F (36.5 C), resp. rate 11, height 4\' 11"  (1.499 m), weight 65 kg, SpO2 100 %.  GENERAL:  78 y.o.-year-old patient lying in the bed with  no acute distress.  Frail-appearing EYES: Pupils equal, round, reactive to light and accommodation. No scleral icterus. Extraocular muscles intact.  HEENT: Head atraumatic, normocephalic. Oropharynx and nasopharynx clear.  NECK:  Supple, no jugular venous distention. No thyroid enlargement, no tenderness.  LUNGS: Normal breath sounds bilaterally, no wheezing, rales,rhonchi or crepitation. No use of accessory muscles of respiration.  CARDIOVASCULAR: S1, S2 normal. No murmurs, rubs, or gallops.  ABDOMEN: Soft, nontender, nondistended. Bowel sounds present. No organomegaly or mass.  EXTREMITIES: No pedal edema, cyanosis, or clubbing.  NEUROLOGIC: Cranial nerves II through XII are intact. MAES. Gait not checked.  PSYCHIATRIC: The patient is alert and oriented x 3.  SKIN: No obvious rash, lesion, or ulcer.   LABORATORY PANEL:   CBC Recent Labs  Lab 09/03/18 1033  WBC 8.6  HGB 12.0  HCT 38.6  PLT 337  MCV 78.0*  MCH 24.2*  MCHC 31.1  RDW 17.6*    ------------------------------------------------------------------------------------------------------------------  Chemistries  Recent Labs  Lab 09/03/18 1033  NA 138  K 4.0  CL 103  CO2 26  GLUCOSE 87  BUN 39*  CREATININE 2.48*  CALCIUM 9.3   ------------------------------------------------------------------------------------------------------------------ estimated creatinine clearance is 15.3 mL/min (A) (by C-G formula based on SCr of 2.48 mg/dL (H)). ------------------------------------------------------------------------------------------------------------------ No results for input(s): TSH, T4TOTAL, T3FREE, THYROIDAB in the last 72 hours.  Invalid input(s): FREET3   Coagulation profile No results for input(s): INR, PROTIME in the last 168 hours. ------------------------------------------------------------------------------------------------------------------- No results for input(s): DDIMER in the last 72 hours. -------------------------------------------------------------------------------------------------------------------  Cardiac Enzymes No results for input(s): CKMB, TROPONINI, MYOGLOBIN in the last 168 hours.  Invalid input(s): CK ------------------------------------------------------------------------------------------------------------------ Invalid input(s): POCBNP  ---------------------------------------------------------------------------------------------------------------  Urinalysis    Component Value Date/Time   COLORURINE YELLOW (A) 07/15/2018 1156   APPEARANCEUR CLEAR (A) 07/15/2018 1156   APPEARANCEUR Clear 11/16/2013 2342   LABSPEC 1.009 07/15/2018 1156   LABSPEC 1.019 11/16/2013 2342   PHURINE 7.0 07/15/2018 1156   GLUCOSEU >=500 (A) 07/15/2018 1156   GLUCOSEU 150 mg/dL 14/78/2956 2130   HGBUR NEGATIVE 07/15/2018 1156   BILIRUBINUR NEGATIVE 07/15/2018 1156   BILIRUBINUR Negative 11/16/2013 2342   KETONESUR 20 (A) 07/15/2018 1156    PROTEINUR NEGATIVE 07/15/2018 1156   UROBILINOGEN 0.2 09/22/2011 1412   NITRITE NEGATIVE 07/15/2018 1156   LEUKOCYTESUR NEGATIVE 07/15/2018 1156   LEUKOCYTESUR Negative 11/16/2013 2342     RADIOLOGY: US Venous Img Lower Unilateral Right  Result Date: 09/03/2018 CLINICAL DATA:  Right lower extremity pain and edema. Evaluate for DVT. EXAM: RIGHT LOWER EXTREMITY VENOUS DOPPLER ULTRASOUND TECHNIQUE: Gray-scale sonography with graded compression, as well as color Doppler and duplex ultrasound were performed to evaluate the lower extremity deep venous systems from the level of the common femoral vein and including the common femoral, femoral, profunda femoral, popliteal and calf veins including the posterior tibial, peroneal and gastrocnemius veins when visible. The superficial great saphenous vein was also interrogated. Spectral Doppler was utilized to evaluate flow at rest and with distal augmentation maneuvers in the common femoral, femoral and popliteal veins. COMPARISON:  None. FINDINGS: Contralateral Common Femoral Vein: Respiratory phasicity is normal and symmetric with the symptomatic side. No evidence of thrombus. Normal compressibility. Common Femoral Vein: No evidence of thrombus. Normal compressibility, respiratory phasicity and response to augmentation. Saphenofemoral Junction: No evidence of thrombus. Normal compressibility and flow on color Doppler imaging. Profunda Femoral Vein: No evidence of thrombus. Normal compressibility and flow on color Doppler imaging. Femoral Vein: There is a very minimal amount nonocclusive echogenic wall thickening/mural calcification  involving the proximal aspect of the right superficial femoral vein. No evidence of thrombus. Normal compressibility, respiratory phasicity and response to augmentation. Popliteal Vein: No evidence of thrombus. Normal compressibility, respiratory phasicity and response to augmentation. Calf Veins: No evidence of thrombus. Normal  compressibility and flow on color Doppler imaging. Superficial Great Saphenous Vein: No evidence of thrombus. Normal compressibility. Venous Reflux:  None. Other Findings:  None. IMPRESSION: 1. No evidence of acute DVT within the right lower extremity. 2. Minimal amount of nonocclusive echogenic wall thickening/mural calcification involving the proximal aspect of the right superficial femoral vein, nonspecific though potentially the sequela of prior DVT. Electronically Signed   By: Simonne Come M.D.   On: 09/03/2018 12:21   Dg Chest Portable 1 View  Result Date: 09/03/2018 CLINICAL DATA:  Unresponsiveness. EXAM: PORTABLE CHEST 1 VIEW COMPARISON:  07/15/2018; 03/18/2018; 05/24/2015 FINDINGS: Grossly unchanged cardiac silhouette and mediastinal contours with atherosclerotic plaque when thoracic aorta. Veiling opacities overlying the bilateral lower lungs are favored to represent overlying soft tissues. Minimal left basilar opacities favored to represent atelectasis, unchanged. No new focal airspace opacities. No pleural effusion or pneumothorax. No evidence of edema. Vascular calcifications overlie the lung apices bilaterally. No acute osseous abnormalities. A peripheral IV overlies the right antecubital fossa. IMPRESSION: 1. No acute cardiopulmonary disease on this AP portable examination. 2.  Aortic Atherosclerosis (ICD10-I70.0). Electronically Signed   By: Simonne Come M.D.   On: 09/03/2018 12:27    EKG: Orders placed or performed during the hospital encounter of 09/03/18  . ED EKG  . ED EKG    IMPRESSION AND PLAN: *Acute kidney injury/renal failure Baseline renal function normal Admit to regular nursing for bed, avoid nephrotoxic agents, check renal ultrasound, gentle IV fluids for rehydration, BMP in the morning, consider nephrology consultation if any further worsening  *Acute encephalopathy/unresponsive state Resolved Suspect due to hypoperfusion given hypotension on presentation Neurochecks  per routine, aspiration/fall/skin care precautions while in house, continue to hold psychotropic meds for now  *Acute hypotension Resolved with IV fluid challenge in the emergency room Suspect due to dehydration IV fluids for rehydration, vitals per routine, hold antihypertensives/diuretics, and continue close medical monitoring  *Chronic diabetes mellitus type 2 Sliding scale insulin with Accu-Cheks per routine  *Chronic hypothyroidism, unspecified Stable Continue Synthroid, check TSH  *Chronic benign essential hypertension Currently hypotensive Hold antihypertensives, vitals per routine, make changes as per necessary  *Chronic iron deficiency anemia Resolved Continue iron supplementation  *History of CVA with hemiparesis Stable Continue aspirin and statin therapy    All the records are reviewed and case discussed with ED provider. Management plans discussed with the patient, family and they are in agreement.  CODE STATUS:full Code Status History    Date Active Date Inactive Code Status Order ID Comments User Context   08/21/2018 1422 08/23/2018 1455 DNR [id number]  Willy Eddy, MD ED   07/15/2018 1522 07/17/2018 1923 Full Code [id number]  Ihor Austin, MD Inpatient   03/18/2018 1145 03/20/2018 1722 Full Code [id number]  Bertrum Sol, MD ED   10/16/2015 0916 10/17/2015 1938 Full Code [id number]  Hower, Cletis Athens, MD ED   05/24/2015 1109 05/31/2015 1833 Full Code [id number]  Gale Journey, MD Inpatient   09/23/2011 0031 09/28/2011 1933 Full Code [id number]  Garnett-Mellinger, Vennie Homans, RN Inpatient    Questions for Most Recent Historical Code Status (Order [id number])    Question Answer Comment   In the event of cardiac or respiratory ARREST Do not call a "code blue"  In the event of cardiac or respiratory ARREST Do not perform Intubation, CPR, defibrillation or ACLS    In the event of cardiac or respiratory ARREST Use medication by any route, position, wound care, and  other measures to relive pain and suffering. May use oxygen, suction and manual treatment of airway obstruction as needed for comfort.        TOTAL TIME TAKING CARE OF THIS PATIENT: 40 minutes.    Evelena AsaMontell D  M.D on 09/03/2018   Between 7am to 6pm - Pager - [phone]  After 6pm go to [email or web address] - password [email or web address]  [facility] [place] [facility]  Office  [phone]  CC: Primary care physician; Karie SchwalbeLetvak, Richard I, MD   Note: This dictation was prepared with Dragon dictation along with smaller phrase technology. Any transcriptional errors that result from this process are unintentional.

## 2018-09-03 NOTE — Progress Notes (Signed)
Family Meeting Note  Advance Directive:yes  Today a meeting took place with the Patient.  Patient is able to participate  The following clinical team members were present during this meeting:MD  The following were discussed:Patient's diagnosis:ARF , Patient's progosis: Unable to determine and Goals for treatment: Full Code  Additional follow-up to be provided: prn  Time spent during discussion:20 minutes  [name] Hemrick D Adleigh Mcmasters, MD  

## 2018-09-03 NOTE — ED Provider Notes (Signed)
[facility] Emergency Department Provider Note ____________________________________________   First MD Initiated Contact with Patient 09/03/18 1036     (approximate)  I have reviewed the triage vital signs and the nursing notes.   HISTORY  Chief Complaint Weakness  Level 5 caveat: History of present illness limited due to dementia  HPI [name] [name] is a 78 y.o. female with PMH as noted below who presents with an episode of apparent unresponsiveness while at her facility.  This history was given by the facility staff.  The patient then awoke and per her daughter, is currently at her baseline mental status.  Per the daughter the patient was admitted last week and since she went back to her facility has been increasingly weak and in bed most the day.  She has iron deficiency anemia and was taking oral iron but her hematologist wants to switch her to iron infusions.  These have not yet started.   Past Medical History:  Diagnosis Date  . Allergic rhinitis   . CAD (coronary artery disease)   . Carotid artery occlusion 02/15/2009  . CVA (cerebral infarction)   . Dementia (HCC)   . Diabetes mellitus   . Gastritis   . GERD (gastroesophageal reflux disease)   . Heart murmur   . Hematemesis   . Hemiplegia (HCC)   . HLD (hyperlipidemia)   . Hypertension   . Hypertensive chronic kidney disease   . Macular degeneration   . Persistent mood (affective) disorder, unspecified (HCC)   . Stroke (HCC)   . Thyroid disease    hypothyroidism  . Ulcer of esophagus without bleeding     Patient Active Problem List   Diagnosis Date Noted  . GI bleed 07/15/2018  . DKA (diabetic ketoacidoses) (HCC) 03/18/2018  . Upper GI bleed 10/16/2015  . Intractable nausea and vomiting 10/16/2015  . Hypertensive urgency 10/16/2015  . Alzheimer's disease (HCC) 05/30/2015  . Altered mental status 05/24/2015  . Occlusion and stenosis of carotid artery without mention of cerebral  infarction 10/08/2011  . CKD (chronic kidney disease) 09/25/2011  . H/O: CVA (cardiovascular accident) 09/22/2011  . Diabetes mellitus, type 2 (HCC) 09/22/2011    Past Surgical History:  Procedure Laterality Date  . APPENDECTOMY    . ESOPHAGOGASTRODUODENOSCOPY Left 10/17/2015   Procedure: ESOPHAGOGASTRODUODENOSCOPY (EGD);  Surgeon: Scot Junobert T Elliott, MD;  [facility];  Service: Endoscopy;  Laterality: Left;  . ESOPHAGOGASTRODUODENOSCOPY N/A 08/22/2018   Procedure: ESOPHAGOGASTRODUODENOSCOPY (EGD);  Surgeon: Toney ReilVanga, Rohini Reddy, MD;  [facility];  Service: Gastroenterology;  Laterality: N/A;  . TONSILLECTOMY      Prior to Admission medications   Medication Sig Start Date End Date Taking? Authorizing Provider  aluminum hydroxide [facility](DERMAGRAN) ointment Apply topically every 12 (twelve) hours as needed. Skin protectant to buttocks   Yes [provider]  amLODipine (NORVASC) 10 MG tablet Take 10 mg by mouth daily.   Yes [provider]  aspirin 81 MG chewable tablet Chew 81 mg by mouth daily.   Yes [provider]  Cholecalciferol (VITAMIN D3) 50000 units CAPS Take 1 capsule by mouth every 30 (thirty) days.    Yes [provider]  citalopram (CELEXA) 10 MG tablet Take 5 mg by mouth daily.    Yes [provider]  furosemide (LASIX) 40 MG tablet Take 0.5 tablets (20 mg total) by mouth daily. 05/29/15  Yes Gouru, Deanna ArtisAruna, MD  gabapentin (NEURONTIN) 300 MG capsule Take 300 mg by mouth every evening.  Yes [provider]  insulin glargine (LANTUS) 100 UNIT/ML injection Inject 0.27 mLs (27 Units total) into the skin at bedtime. Patient taking differently: Inject 20 Units into the skin at bedtime.  05/31/15  Yes Gouru, Deanna Artis, MD  levothyroxine (SYNTHROID, LEVOTHROID) 88 MCG tablet Take 88 mcg by mouth every evening.    Yes [provider]  lovastatin (MEVACOR) 20 MG tablet Take 20 mg by mouth every evening.    Yes  [provider]  memantine (NAMENDA) 10 MG tablet Take 10 mg by mouth 2 (two) times daily.   Yes [provider]  metoprolol succinate (TOPROL-XL) 25 MG 24 hr tablet Take 25 mg by mouth daily.   Yes [provider]  pantoprazole (PROTONIX) 40 MG tablet Take 1 tablet (40 mg total) by mouth 2 (two) times daily before a meal. 08/23/18  Yes [name], MD  polyethylene glycol (MIRALAX / GLYCOLAX) packet Take 17 g by mouth daily.   Yes [provider]  potassium chloride (K-DUR) 10 MEQ tablet Take 10 mEq by mouth daily.    Yes [provider]  senna-docusate (SENOKOT-S) 8.6-50 MG tablet Take 2 tablets by mouth daily.   Yes [provider]  traZODone (DESYREL) 50 MG tablet Take 25 mg by mouth at bedtime.    Yes [provider]  guaiFENesin (ROBITUSSIN) 100 MG/5ML liquid Take 200 mg by mouth every 4 (four) hours as needed for cough.    [provider]  hydroxypropyl methylcellulose / hypromellose (ISOPTO TEARS / GONIOVISC) 2.5 % ophthalmic solution Place 1 drop into both eyes 3 (three) times daily as needed for dry eyes.    [provider]  Menthol (RICOLA CHERRY HONEY HERB) 2 MG LOZG Use as directed 1 lozenge in the mouth or throat every 2 (two) hours as needed (FOR COUGH).    [provider]  metoCLOPramide (REGLAN) 5 MG tablet Take 1 tablet (5 mg total) by mouth 3 (three) times daily before meals for 4 days. 07/17/18 08/21/18  Milagros Loll, MD  ondansetron (ZOFRAN) 4 MG tablet Take 4 mg by mouth daily as needed for nausea or vomiting.     [provider]    Allergies Fexofenadine hcl  Family History  Problem Relation Age of Onset  . Diabetes Mother   . Hypertension Mother   . Diabetes Father   . Hypertension Father   . Diabetes Sister   . Hypertension Sister     Social History Social History   Tobacco Use  . Smoking status: Former Smoker    Types: Cigarettes    Last attempt to  quit: 07/20/1985    Years since quitting: 33.1  . Smokeless tobacco: Never Used  Substance Use Topics  . Alcohol use: No  . Drug use: No    Review of Systems Level 5 caveat: Unable to obtain review of systems due to dementia    ____________________________________________   PHYSICAL EXAM:  VITAL SIGNS: ED Triage Vitals  Enc Vitals Group     BP 09/03/18 1026 (!) 86/62     Pulse Rate 09/03/18 1026 79     Resp 09/03/18 1026 18     Temp 09/03/18 1026 97.7 F (36.5 C)     Temp src --      SpO2 09/03/18 1026 96 %     Weight 09/03/18 1027 143 lb 4.8 oz (65 kg)     Height 09/03/18 1027 4\' 11"  (1.499 m)     Head Circumference --  Peak Flow --      Pain Score --      Pain Loc --      Pain Edu? --      Excl. in GC? --     Constitutional: Alert, following commands.  Nonverbal.  Weak but comfortable appearing. Eyes: Conjunctivae are normal.  EOMI.  PERRLA. Head: Atraumatic. Nose: No congestion/rhinnorhea. Mouth/Throat: Mucous membranes are dry.   Neck: Normal range of motion.  Cardiovascular: Normal rate, regular rhythm. Grossly normal heart sounds.  Good peripheral circulation. Respiratory: Normal respiratory effort.  No retractions. Lungs CTAB. Gastrointestinal: Soft and nontender. No distention.  Genitourinary: No flank tenderness. Musculoskeletal: No lower extremity edema.  Extremities warm and well perfused.  Mild right calf and popliteal tenderness.  No cutaneous findings. Neurologic:  Normal speech and language. No gross focal neurologic deficits are appreciated.  Skin:  Skin is warm and dry. No rash noted. Psychiatric: Calm and cooperative.  ____________________________________________   LABS (all labs ordered are listed, but only abnormal results are displayed)  Labs Reviewed  BASIC METABOLIC PANEL - Abnormal; Notable for the following components:      Result Value   BUN 39 (*)    Creatinine, Ser 2.48 (*)    GFR calc non Af Amer 18 (*)    GFR calc Af Amer  21 (*)    All other components within normal limits  CBC - Abnormal; Notable for the following components:   MCV 78.0 (*)    MCH 24.2 (*)    RDW 17.6 (*)    All other components within normal limits  LACTIC ACID, PLASMA  URINALYSIS, COMPLETE (UACMP) WITH MICROSCOPIC  LACTIC ACID, PLASMA  CBG MONITORING, ED   ____________________________________________  EKG  ED ECG REPORT I, Dionne Bucy, the attending physician, personally viewed and interpreted this ECG.  Date: 09/03/2018 EKG Time:  Rate:  Rhythm:  QRS Axis:  Intervals:  ST/T Wave abnormalities:  Narrative Interpretation:   ____________________________________________  RADIOLOGY  CXR: No focal infiltrate [place] RLE: No acute DVT  ____________________________________________   PROCEDURES  Procedure(s) performed: No  Procedures  Critical Care performed: No ____________________________________________   INITIAL IMPRESSION / ASSESSMENT AND PLAN / ED COURSE  Pertinent labs & imaging results that were available during my care of the patient were reviewed by me and considered in my medical decision making (see chart for details).  78 year old female with PMH as noted above presents with an apparent episode of decreased responsiveness or altered mental status today which per the daughter has now resolved.  The daughter states that the patient is now at her baseline mental status.  The patient is unable to identify any complaints except for right leg pain which is new.  She has no history of trauma there.  I reviewed the past medical records in Epic.  The patient was admitted recently and discharged 11 days ago with GI bleed and anemia.  On exam the patient is alert and following commands.  She does appear somewhat dehydrated with dry mucous membranes, and her blood pressure is borderline low.  Her other vital signs are normal.  The remainder of the exam is as described above.  She does have some tenderness in the  right leg but no swelling or cutaneous findings.  Differential includes infection/sepsis, dehydration, other metabolic etiology, worsening anemia, or less likely cardiac cause.  We will obtain lab work-up for these etiologies.  I will also obtain DVT study to rule out acute DVT as the patient has been  less mobile than normal.  ----------------------------------------- 1:22 PM on 09/03/2018 -----------------------------------------  Work-up reveals elevated creatinine when compared to blood work from last week.  This is most consistent with dehydration/hypovolemia.  The ultrasound and x-ray are negative.  Lactate is normal.  I do not suspect acute infection.  Blood pressure is improved with fluid bolus.  Given the AKI, the patient will need admission for further hydration and monitoring.  I signed the patient out to the hospitalist Dr. Katheren ShamsSalary at approximately 1:15PM. ____________________________________________   FINAL CLINICAL IMPRESSION(S) / ED DIAGNOSES  Final diagnoses:  Acute renal insufficiency      NEW MEDICATIONS STARTED DURING THIS VISIT:  New Prescriptions   No medications on file     Note:  This document was prepared using Dragon voice recognition software and may include unintentional dictation errors.    Dionne BucySiadecki, Adamariz Gillott, MD 09/03/18 1323

## 2018-09-04 DIAGNOSIS — L8990 Pressure ulcer of unspecified site, unspecified stage: Secondary | ICD-10-CM

## 2018-09-04 LAB — BASIC METABOLIC PANEL
Anion gap: 5 (ref 5–15)
BUN: 27 mg/dL — ABNORMAL HIGH (ref 8–23)
CO2: 20 mmol/L — ABNORMAL LOW (ref 22–32)
Calcium: 7.9 mg/dL — ABNORMAL LOW (ref 8.9–10.3)
Chloride: 117 mmol/L — ABNORMAL HIGH (ref 98–111)
Creatinine, Ser: 1.55 mg/dL — ABNORMAL HIGH (ref 0.44–1.00)
GFR calc Af Amer: 37 mL/min — ABNORMAL LOW (ref >60)
GFR calc non Af Amer: 32 mL/min — ABNORMAL LOW (ref >60)
Glucose, Bld: 116 mg/dL — ABNORMAL HIGH (ref 70–99)
Potassium: 4.9 mmol/L (ref 3.5–5.1)
Sodium: 142 mmol/L (ref 135–145)

## 2018-09-04 LAB — GLUCOSE, CAPILLARY
GLUCOSE-CAPILLARY: 58 mg/dL — AB (ref 70–99)
GLUCOSE-CAPILLARY: 116 mg/dL — AB (ref 70–99)
Glucose-Capillary: 61 mg/dL — ABNORMAL LOW (ref 70–99)
Glucose-Capillary: 64 mg/dL — ABNORMAL LOW (ref 70–99)
Glucose-Capillary: 70 mg/dL (ref 70–99)
Glucose-Capillary: 147 mg/dL — ABNORMAL HIGH (ref 70–99)
Glucose-Capillary: 154 mg/dL — ABNORMAL HIGH (ref 70–99)

## 2018-09-04 MED ORDER — ALBUTEROL SULFATE (2.5 MG/3ML) 0.083% IN NEBU: 2.5000 mg | INHALATION_SOLUTION | Freq: Four times a day (QID) | RESPIRATORY_TRACT | Status: DC | PRN

## 2018-09-04 NOTE — Progress Notes (Signed)
PT Cancellation Note  Patient Details Name: [name] [name] MRN: [id number] DOB: 29-Sep-1940   Cancelled Treatment:    Reason Eval/Treat Not Completed: PT screened, no needs identified, will sign off. Order received and chart reviewed. Spoke with daughter [name] via telephone [phone] who reports that pt is LTC resident at [facility]. She does not ambulate and only transfers to/from wheelchair with staff assist. She has not received PT recently due to her dementia, unwillingness to participate, and multiple hospital admissions. Daughter states that pt has no PT needs at this time. Given that pt is LTC resident she should not need PT evaluation to return to [facility]. Order will be completed. Please enter new order if status or needs change.  Sharalyn Ink [place] PT, DPT, GCS  Lamarius Dirr 09/04/2018, 12:43 PM

## 2018-09-04 NOTE — Progress Notes (Signed)
[facility]   PATIENT NAME: Therasa Kornbluth    MR#:  [id number]  DATE OF BIRTH:  Sep 24, 1940  SUBJECTIVE:  came in after patient was found lethargic at [facility]. She is more awake alert. She ate good breakfast this morning. No fever. No new issues per RN.  REVIEW OF SYSTEMS:   Review of Systems  Constitutional: Negative for chills, fever and weight loss.  HENT: Negative for ear discharge, ear pain and nosebleeds.   Eyes: Negative for blurred vision, pain and discharge.  Respiratory: Negative for sputum production, shortness of breath, wheezing and stridor.   Cardiovascular: Negative for chest pain, palpitations, orthopnea and PND.  Gastrointestinal: Negative for abdominal pain, diarrhea, nausea and vomiting.  Genitourinary: Negative for frequency and urgency.  Musculoskeletal: Negative for back pain and joint pain.  Neurological: Positive for weakness. Negative for sensory change, speech change and focal weakness.  Psychiatric/Behavioral: Negative for depression and hallucinations. The patient is not nervous/anxious.    Tolerating Diet: yes Tolerating PT: [facility]  DRUG ALLERGIES:   Allergies  Allergen Reactions  . Fexofenadine Hcl Rash    VITALS:  Blood pressure 126/64, pulse 72, temperature 98.4 F (36.9 C), temperature source Oral, resp. rate 16, height 4\' 11"  (1.499 m), weight 67.3 kg, SpO2 100 %.  PHYSICAL EXAMINATION:   Physical Exam  GENERAL:  78 y.o.-year-old patient lying in the bed with no acute distress. Weak EYES: Pupils equal, round, reactive to light and accommodation. No scleral icterus. Extraocular muscles intact.  HEENT: Head atraumatic, normocephalic. Oropharynx and nasopharynx clear.  NECK:  Supple, no jugular venous distention. No thyroid enlargement, no tenderness.  LUNGS: Normal breath sounds bilaterally, no wheezing, rales, rhonchi. No use of accessory muscles of respiration.   CARDIOVASCULAR: S1, S2 normal. No murmurs, rubs, or gallops.  ABDOMEN: Soft, nontender, nondistended. Bowel sounds present. No organomegaly or mass.  EXTREMITIES: No cyanosis, clubbing or edema b/l.    NEUROLOGIC: Cranial nerves II through XII are intact. No focal Motor or sensory deficits b/l.   PSYCHIATRIC:  patient is alert and awake  SKIN: No obvious rash, lesion, or ulcer.   LABORATORY PANEL:  CBC Recent Labs  Lab 09/03/18 1033  WBC 8.6  HGB 12.0  HCT 38.6  PLT 337    Chemistries  Recent Labs  Lab 09/04/18 0438  NA 142  K 4.9  CL 117*  CO2 20*  GLUCOSE 116*  BUN 27*  CREATININE 1.55*  CALCIUM 7.9*   Cardiac Enzymes No results for input(s): TROPONINI in the last 168 hours. RADIOLOGY:  US Renal  Result Date: 09/03/2018 CLINICAL DATA:  Acute renal failure EXAM: RENAL / URINARY TRACT ULTRASOUND COMPLETE COMPARISON:  None. FINDINGS: Right Kidney: Renal measurements: 7.9 x 3.2 x 3.3 cm = volume: 44.6 mL. Increased cortical echogenicity. Left Kidney: Renal measurements: 7.7 x 4.5 x 3.3 cm = volume: 61.5 mL. Echogenicity within normal limits. No mass or hydronephrosis visualized. Bladder: Appears normal for degree of bladder distention. IMPRESSION: Small echogenic kidneys consistent with medical renal disease. No hydronephrosis. Electronically Signed   By: Gerome Sam III M.D   On: 09/03/2018 17:44   US Venous Img Lower Unilateral Right  Result Date: 09/03/2018 CLINICAL DATA:  Right lower extremity pain and edema. Evaluate for DVT. EXAM: RIGHT LOWER EXTREMITY VENOUS DOPPLER ULTRASOUND TECHNIQUE: Gray-scale sonography with graded compression, as well as color Doppler and duplex ultrasound were performed to evaluate the lower extremity deep venous systems from  the level of the common femoral vein and including the common femoral, femoral, profunda femoral, popliteal and calf veins including the posterior tibial, peroneal and gastrocnemius veins when visible. The superficial  great saphenous vein was also interrogated. Spectral Doppler was utilized to evaluate flow at rest and with distal augmentation maneuvers in the common femoral, femoral and popliteal veins. COMPARISON:  None. FINDINGS: Contralateral Common Femoral Vein: Respiratory phasicity is normal and symmetric with the symptomatic side. No evidence of thrombus. Normal compressibility. Common Femoral Vein: No evidence of thrombus. Normal compressibility, respiratory phasicity and response to augmentation. Saphenofemoral Junction: No evidence of thrombus. Normal compressibility and flow on color Doppler imaging. Profunda Femoral Vein: No evidence of thrombus. Normal compressibility and flow on color Doppler imaging. Femoral Vein: There is a very minimal amount nonocclusive echogenic wall thickening/mural calcification involving the proximal aspect of the right superficial femoral vein. No evidence of thrombus. Normal compressibility, respiratory phasicity and response to augmentation. Popliteal Vein: No evidence of thrombus. Normal compressibility, respiratory phasicity and response to augmentation. Calf Veins: No evidence of thrombus. Normal compressibility and flow on color Doppler imaging. Superficial Great Saphenous Vein: No evidence of thrombus. Normal compressibility. Venous Reflux:  None. Other Findings:  None. IMPRESSION: 1. No evidence of acute DVT within the right lower extremity. 2. Minimal amount of nonocclusive echogenic wall thickening/mural calcification involving the proximal aspect of the right superficial femoral vein, nonspecific though potentially the sequela of prior DVT. Electronically Signed   By: Simonne Come M.D.   On: 09/03/2018 12:21   Dg Chest Portable 1 View  Result Date: 09/03/2018 CLINICAL DATA:  Unresponsiveness. EXAM: PORTABLE CHEST 1 VIEW COMPARISON:  07/15/2018; 03/18/2018; 05/24/2015 FINDINGS: Grossly unchanged cardiac silhouette and mediastinal contours with atherosclerotic plaque when  thoracic aorta. Veiling opacities overlying the bilateral lower lungs are favored to represent overlying soft tissues. Minimal left basilar opacities favored to represent atelectasis, unchanged. No new focal airspace opacities. No pleural effusion or pneumothorax. No evidence of edema. Vascular calcifications overlie the lung apices bilaterally. No acute osseous abnormalities. A peripheral IV overlies the right antecubital fossa. IMPRESSION: 1. No acute cardiopulmonary disease on this AP portable examination. 2.  Aortic Atherosclerosis (ICD10-I70.0). Electronically Signed   By: Simonne Come M.D.   On: 09/03/2018 12:27   ASSESSMENT AND PLAN:  Harmanie Trivitt  is a 78 y.o. female with a known history per below was recently in the hospital for GI bleeding, sent to inpatient rehab, presenting from rehab facility with acute unresponsive state, noted to have blood pressure in the 80s, did respond with IV fluid challenge in the emergency room  *Acute kidney injury/renal failure Baseline renal function normal 1.0  avoid nephrotoxic agents,  renal ultrasound-negative for acute pathology  cont gentle IV fluids for rehydration - came in with creatinine of 2.48--- IV fluids--- 1.55  *Acute encephalopathy/unresponsive state improved Suspect due to hypoperfusion given hypotension on presentation continue to hold psychotropic meds for now  *Acute hypotension Resolved with IV fluid challenge in the emergency room Suspect due to dehydration IV fluids for rehydration, vitals per routine, hold antihypertensives/diuretics, and continue close medical monitoring  *Chronic diabetes mellitus type 2 Sliding scale insulin with Accu-Cheks per routine  *Chronic hypothyroidism, unspecified Stable Continue Synthroid  *Chronic benign essential hypertension Currently hypotensive Hold antihypertensives, vitals per routine, make changes as per necessary  *Chronic iron deficiency anemia Resolved Continue iron  supplementation-- patient [facility] with Dr. Cathie Hoops at cancer center for IV iron therapy. Patient will continue it after discharge  *History  of CVA with hemiparesis Stable Continue statin therapy  If continues to improve should [facility] to continue her rehab.  Case discussed with Care Management/Social Worker.  CODE STATUS: full DVT Prophylaxis: SCD  TOTAL TIME TAKING CARE OF THIS PATIENT: *30* minutes.  >50% time spent on counselling and coordination of care  POSSIBLE D/C IN *1-2* DAYS, DEPENDING ON CLINICAL CONDITION.  Note: This dictation was prepared with Dragon dictation along with smaller phrase technology. Any transcriptional errors that result from this process are unintentional.  Enedina FinnerSona Adaisha Campise M.D on 09/04/2018 at 1:40 PM  Between 7am to 6pm - Pager - [phone]  After 6pm go to [email or web address] - password [email or web address]  [facility]  Office  [phone]  CC: Primary care physician; Karie SchwalbeLetvak, Richard I, MDPatient ID: Daphene CalamitySophie A Macera, female   DOB: 10/21/1940, 78 y.o.   MRN: [id number]

## 2018-09-04 NOTE — [place] FL2 (Signed)
[place] MEDICAID FL2 LEVEL OF CARE SCREENING TOOL     IDENTIFICATION  Patient Name: [name] [name] Birthdate: Mar 13, 1941 Sex: female Admission Date (Current Location): 09/03/2018  [place] and [place] Number:  [address] [id number] Q Facility and Address:  [facility], [address], [place], [place] [address]      Provider Number: [id number]  Attending Physician Name and Address:  [name], MD  Relative Name and Phone Number:  [name] (Daughter) [phone]    Current Level of Care: Hospital Recommended Level of Care: Skilled Nursing Facility Prior Approval Number:    Date Approved/Denied:   PASRR Number: [id number]  Discharge Plan: SNF    Current Diagnoses: Patient Active Problem List   Diagnosis Date Noted  . Pressure injury of skin 09/04/2018  . ARF (acute renal failure) (HCC) 09/03/2018  . GI bleed 07/15/2018  . DKA (diabetic ketoacidoses) (HCC) 03/18/2018  . Upper GI bleed 10/16/2015  . Intractable nausea and vomiting 10/16/2015  . Hypertensive urgency 10/16/2015  . Alzheimer's disease (HCC) 05/30/2015  . Altered mental status 05/24/2015  . Occlusion and stenosis of carotid artery without mention of cerebral infarction 10/08/2011  . CKD (chronic kidney disease) 09/25/2011  . H/O: CVA (cardiovascular accident) 09/22/2011  . Diabetes mellitus, type 2 (HCC) 09/22/2011    Orientation RESPIRATION BLADDER Height & Weight     Self, Time, Situation, Place  Normal Incontinent Weight: 148 lb 6.4 oz (67.3 kg) Height:  4\' 11"  (149.9 cm)  BEHAVIORAL SYMPTOMS/MOOD NEUROLOGICAL BOWEL NUTRITION STATUS      Continent (heart healthy/carb modified)  AMBULATORY STATUS COMMUNICATION OF NEEDS Skin   Extensive Assist Verbally Normal                       Personal Care Assistance Level of Assistance  Bathing, Feeding, Dressing Bathing Assistance: Maximum assistance Feeding assistance: Independent Dressing Assistance: Maximum  assistance     Functional Limitations Info    Sight Info: Adequate Hearing Info: Adequate Speech Info: Adequate    SPECIAL CARE FACTORS FREQUENCY                       Contractures Contractures Info: Not present    Additional Factors Info  Code Status Code Status Info: Full Allergies Info:  Fexofenadine Hcl           Current Medications (09/04/2018):  This is the current hospital active medication list Current Facility-Administered Medications  Medication Dose Route Frequency Provider Last Rate Last Dose  . 0.9 %  sodium chloride infusion   Intravenous Continuous [name], MD 75 mL/hr at 09/04/18 0955    . acetaminophen (TYLENOL) tablet 650 mg  650 mg Oral Q6H PRN Salary, Montell D, MD       Or  . acetaminophen (TYLENOL) suppository 650 mg  650 mg Rectal Q6H PRN Salary, Montell D, MD      . albuterol (PROVENTIL) (2.5 MG/3ML) 0.083% nebulizer solution 2.5 mg  2.5 mg Nebulization Q6H PRN [name], MD      . aluminum hydroxide (DERMAGRAN) ointment   Topical Q6H PRN Salary, Montell D, MD      . aspirin chewable tablet 81 mg  81 mg Oral Daily Salary, Montell D, MD   81 mg at 09/04/18 0955  . heparin injection 5,000 Units  5,000 Units Subcutaneous Q8H Salary, Montell D, MD   5,000 Units at 09/04/18 0551  . hydrALAZINE (APRESOLINE) injection 10 mg  10 mg Intravenous Q4H PRN  Salary, Montell D, MD      . insulin aspart (novoLOG) injection 0-15 Units  0-15 Units Subcutaneous TID WC Salary, Jetty Duhamel D, MD   1 Units at 09/04/18 1218  . insulin aspart (novoLOG) injection 0-5 Units  0-5 Units Subcutaneous QHS Salary, Montell D, MD      . insulin glargine (LANTUS) injection 20 Units  20 Units Subcutaneous QHS Angelina Ok D, MD   20 Units at 09/03/18 2225  . levothyroxine (SYNTHROID, LEVOTHROID) tablet 88 mcg  88 mcg Oral Q0600 Angelina Ok D, MD   88 mcg at 09/04/18 0551  . memantine (NAMENDA) tablet 5 mg  5 mg Oral BID Angelina Ok D, MD   5 mg at 09/04/18 0955  .  menthol-cetylpyridinium (CEPACOL) lozenge 3 mg  1 lozenge Oral Q2H PRN Salary, Montell D, MD      . metoCLOPramide (REGLAN) tablet 5 mg  5 mg Oral TID AC Salary, Montell D, MD   5 mg at 09/04/18 0956  . ondansetron (ZOFRAN) tablet 4 mg  4 mg Oral Q6H PRN Salary, Montell D, MD       Or  . ondansetron (ZOFRAN) injection 4 mg  4 mg Intravenous Q6H PRN Salary, Montell D, MD      . pantoprazole (PROTONIX) EC tablet 40 mg  40 mg Oral BID AC Salary, Montell D, MD   40 mg at 09/04/18 0955  . polyethylene glycol (MIRALAX / GLYCOLAX) packet 17 g  17 g Oral Daily Salary, Montell D, MD      . polyvinyl alcohol (LIQUIFILM TEARS) 1.4 % ophthalmic solution 1 drop  1 drop Both Eyes TID PRN Salary, Montell D, MD      . potassium chloride (K-DUR) CR tablet 10 mEq  10 mEq Oral Daily Salary, Montell D, MD   10 mEq at 09/04/18 0955  . pravastatin (PRAVACHOL) tablet 80 mg  80 mg Oral q1800 Salary, Jetty Duhamel D, MD   80 mg at 09/03/18 1737  . senna-docusate (Senokot-S) tablet 2 tablet  2 tablet Oral Daily Salary, Evelena Asa, MD   2 tablet at 09/04/18 0955  . sodium chloride flush (NS) 0.9 % injection 3 mL  3 mL Intravenous Once Dionne Bucy, MD      . traZODone (DESYREL) tablet 25 mg  25 mg Oral QHS Salary, Jetty Duhamel D, MD   25 mg at 09/03/18 2225  . [START ON 09/15/2018] Vitamin D3 CAPS 1 capsule  1 capsule Oral Q30 days Salary, Evelena Asa, MD         Discharge Medications: Please see discharge summary for a list of discharge medications.  Relevant Imaging Results:  Relevant Lab Results:   Additional Information SS#[id number]  Judi Cong, LCSW

## 2018-09-04 NOTE — Clinical Social Work Note (Signed)
Clinical Social Work Assessment  Patient Details  Name: [name] [name] MRN: [id number] Date of Birth: 24-Apr-1941  Date of referral:  09/04/18               Reason for consult:  Facility Placement                Permission sought to share information with:  Chartered certified accountant granted to share information::  Yes, Verbal Permission Granted  Name::        Agency::  [facility]  Relationship::     Contact Information:     Housing/Transportation Living arrangements for the past 2 months:  [place] of Information:  Patient, Medical Team, Adult Children, Facility Patient Interpreter Needed:  None Criminal Activity/Legal Involvement Pertinent to Current Situation/Hospitalization:  No - Comment as needed Significant Relationships:  Adult Children, Warehouse manager Lives with:  Facility Resident Do you feel safe going back to the place where you live?  Yes Need for family participation in patient care:  No (Coment)  Care giving concerns: Patient admitted from [place] term care   Social Worker assessment / plan:  The CSW met with the patient at bedside. No family was present. The patient was resting comfortably, alert, and watching television. The patient provided verbal permission for the CSW to contact the patient's daughter. The CSW contacted [name] [name] by phone; [name] [name] confirmed that her mother admitted from [facility] and would return. The CSW confirmed that the patient's family is aware of possible discharge tomorrow and the patient's current status and improvement.  The patient will most likely return to [facility] tomorrow via non-emergent EMS. The CSW is following for discharge facilitation.  Employment status:  Retired Forensic scientist:  Information systems manager, Medicaid [place] PT Recommendations:  Not assessed at this time Information / Referral to community resources:     Patient/Family's Response to care: The patient and  her family thanked the CSW for assistance and contact.  Patient/Family's Understanding of and Emotional Response to Diagnosis, Current Treatment, and Prognosis:  The patient's family seems to understand the discharge plan and potential timing.   Emotional Assessment Appearance:  Appears stated age Attitude/Demeanor/Rapport:  Gracious, Engaged Affect (typically observed):  Accepting, Appropriate, Pleasant Orientation:  Oriented to Self, Oriented to Place, Oriented to  Time, Oriented to Situation Alcohol / Substance use:  Never Used Psych involvement (Current and /or in the community):  No (Comment)  Discharge Needs  Concerns to be addressed:  Discharge Planning Concerns, Care Coordination Readmission within the last 30 days:  Yes Current discharge risk:  Chronically ill Barriers to Discharge:  Continued Medical Work up   [employer], LCSW 09/04/2018, 2:13 PM

## 2018-09-05 LAB — BASIC METABOLIC PANEL
Anion gap: 6 (ref 5–15)
BUN: 11 mg/dL (ref 8–23)
CO2: 21 mmol/L — ABNORMAL LOW (ref 22–32)
CREATININE: 1.09 mg/dL — AB (ref 0.44–1.00)
Calcium: 7.9 mg/dL — ABNORMAL LOW (ref 8.9–10.3)
Chloride: 115 mmol/L — ABNORMAL HIGH (ref 98–111)
GFR calc Af Amer: 56 mL/min — ABNORMAL LOW (ref >60)
GFR calc non Af Amer: 49 mL/min — ABNORMAL LOW (ref >60)
Glucose, Bld: 71 mg/dL (ref 70–99)
Potassium: 3.3 mmol/L — ABNORMAL LOW (ref 3.5–5.1)
Sodium: 142 mmol/L (ref 135–145)

## 2018-09-05 LAB — GLUCOSE, CAPILLARY
Glucose-Capillary: 85 mg/dL (ref 70–99)
Glucose-Capillary: 56 mg/dL — ABNORMAL LOW (ref 70–99)
Glucose-Capillary: 58 mg/dL — ABNORMAL LOW (ref 70–99)

## 2018-09-05 MED ORDER — INSULIN GLARGINE 100 UNIT/ML ~~LOC~~ SOLN: 10.0000 [IU] | Freq: Every day | SUBCUTANEOUS | 11 refills | Status: DC

## 2018-09-05 NOTE — Discharge Summary (Addendum)
[name] - [facility] - [place] at [facility]   PATIENT NAME: [name] [name]    MR#:  [id number]  DATE OF BIRTH:  [birth year]  DATE OF ADMISSION:  09/03/2018 ADMITTING PHYSICIAN: Bertrum Sol, MD  DATE OF DISCHARGE: 09/05/2018  PRIMARY CARE PHYSICIAN: Karie Schwalbe, MD    ADMISSION DIAGNOSIS:  Acute renal insufficiency [N28.9]  DISCHARGE DIAGNOSIS:   Acute renal failure secondary to prerenal azotemia/dehydration improved acute encephalopathy-- mentation at baseline SECONDARY DIAGNOSIS:   Past Medical History:  Diagnosis Date  . Allergic rhinitis   . CAD (coronary artery disease)   . Carotid artery occlusion 02/15/2009  . CVA (cerebral infarction)   . Dementia (HCC)   . Diabetes mellitus   . Gastritis   . GERD (gastroesophageal reflux disease)   . Heart murmur   . Hematemesis   . Hemiplegia (HCC)   . HLD (hyperlipidemia)   . Hypertension   . Hypertensive chronic kidney disease   . Macular degeneration   . Persistent mood (affective) disorder, unspecified (HCC)   . Stroke (HCC)   . Thyroid disease    hypothyroidism  . Ulcer of esophagus without bleeding     HOSPITAL COURSE:  SophieDreweryis a78 y.o.femalewith a known history per below was recently in the hospital for GI bleeding, sent to inpatient rehab, presenting from rehab facility with acute unresponsive state, noted to have blood pressure in the 80s, did respond with IV fluid challenge in the emergency room  *Acute kidney injury/renal failure -Baseline renal function normal 1.0 - avoid nephrotoxic agents,  renal ultrasound-negative for acute pathology - recieved gentle IV fluids for rehydration - came in with creatinine of 2.48--- IV fluids--- 1.55--1.05  *Acute encephalopathy/unresponsive state -improved appears at baseline -Suspect due to hypoperfusion given hypotension on presentation -pt has baseline dementia  *Acute hypotension improved -Resolved with IV fluid  challenge in the emergency room -Suspect due to dehydration -D/ced lasix and norvasc -cont metoprolol XL  *Chronic diabetes mellitus type 2 -Sliding scale insulin with Accu-Cheks per routine  *Chronic hypothyroidism, unspecified Continue Synthroid  *Chronic iron deficiency anemia Continue iron supplementation-- [name]. Patient will continue it after discharge  *History of CVA with hemiparesis Stable Continue statin therapy  Pt will return back to [name]  to continue her rehab. d/w dter [name]  CONSULTS OBTAINED:    DRUG ALLERGIES:   Allergies  Allergen Reactions  . Fexofenadine Hcl Rash    DISCHARGE MEDICATIONS:   Allergies as of 09/05/2018      Reactions   Fexofenadine Hcl Rash      Medication List    STOP taking these medications   amLODipine 10 MG tablet Commonly known as:  NORVASC   furosemide 40 MG tablet Commonly known as:  LASIX   metoCLOPramide 5 MG tablet Commonly known as:  REGLAN     TAKE these medications   aluminum hydroxide ointment Commonly known as:  DERMAGRAN Apply topically every 12 (twelve) hours as needed. Skin protectant to buttocks   aspirin 81 MG chewable tablet Chew 81 mg by mouth daily.   citalopram 10 MG tablet Commonly known as:  CELEXA Take 5 mg by mouth daily.   gabapentin 300 MG capsule Commonly known as:  NEURONTIN Take 300 mg by mouth every evening.   guaiFENesin 100 MG/5ML liquid Commonly known as:  ROBITUSSIN Take 200 mg by mouth every 4 (four) hours as needed for cough.   hydroxypropyl methylcellulose /  hypromellose 2.5 % ophthalmic solution Commonly known as:  ISOPTO TEARS / GONIOVISC Place 1 drop into both eyes 3 (three) times daily as needed for dry eyes.   insulin glargine 100 UNIT/ML injection Commonly known as:  LANTUS Inject 0.1 mLs (10 Units total) into the skin daily. What changed:    how much to take  when to take  this   levothyroxine 88 MCG tablet Commonly known as:  SYNTHROID, LEVOTHROID Take 88 mcg by mouth every evening.   lovastatin 20 MG tablet Commonly known as:  MEVACOR Take 20 mg by mouth every evening.   memantine 10 MG tablet Commonly known as:  NAMENDA Take 10 mg by mouth 2 (two) times daily.   metoprolol succinate 25 MG 24 hr tablet Commonly known as:  TOPROL-XL Take 25 mg by mouth daily.   ondansetron 4 MG tablet Commonly known as:  ZOFRAN Take 4 mg by mouth daily as needed for nausea or vomiting.   pantoprazole 40 MG tablet Commonly known as:  PROTONIX Take 1 tablet (40 mg total) by mouth 2 (two) times daily before a meal.   polyethylene glycol packet Commonly known as:  MIRALAX / GLYCOLAX Take 17 g by mouth daily.   potassium chloride 10 MEQ tablet Commonly known as:  K-DUR Take 10 mEq by mouth daily.   RICOLA CHERRY HONEY HERB 2 MG Lozg Generic drug:  Menthol Use as directed 1 lozenge in the mouth or throat every 2 (two) hours as needed (FOR COUGH).   senna-docusate 8.6-50 MG tablet Commonly known as:  Senokot-S Take 2 tablets by mouth daily.   traZODone 50 MG tablet Commonly known as:  DESYREL Take 25 mg by mouth at bedtime.   Vitamin D3 1.25 MG (50000 UT) Caps Take 1 capsule by mouth every 30 (thirty) days.       If you experience worsening of your admission symptoms, develop shortness of breath, life threatening emergency, suicidal or homicidal thoughts you must seek medical attention immediately by calling 911 or calling your MD immediately  if symptoms less severe.  You Must read complete instructions/literature along with all the possible adverse reactions/side effects for all the Medicines you take and that have been prescribed to you. Take any new Medicines after you have completely understood and accept all the possible adverse reactions/side effects.   Please note  You were cared for by a hospitalist during your hospital stay. If you have any  questions about your discharge medications or the care you received while you were in the hospital after you are discharged, you can call the unit and asked to speak with the hospitalist on call if the hospitalist that took care of you is not available. Once you are discharged, your primary care physician will handle any further medical issues. Please note that NO REFILLS for any discharge medications will be authorized once you are discharged, as it is imperative that you return to your primary care physician (or establish a relationship with a primary care physician if you do not have one) for your aftercare needs so that they can reassess your need for medications and monitor your lab values. Today   SUBJECTIVE   No new issues per RN and pt  VITAL SIGNS:  Blood pressure (!) 124/49, pulse 71, temperature 98.7 F (37.1 C), temperature source Oral, resp. rate 18, height 4\' 11"  (1.499 m), weight 67.3 kg, SpO2 98 %.  I/O:    Intake/Output Summary (Last 24 hours) at 09/05/2018 0904 Last data  filed at 09/04/2018 2306 Gross per 24 hour  Intake 490 ml  Output 750 ml  Net -260 ml    PHYSICAL EXAMINATION:  GENERAL:  78 y.o.-year-old patient lying in the bed with no acute distress.  EYES: Pupils equal, round, reactive to light and accommodation. No scleral icterus. Extraocular muscles intact.  HEENT: Head atraumatic, normocephalic. Oropharynx and nasopharynx clear.  NECK:  Supple, no jugular venous distention. No thyroid enlargement, no tenderness.  LUNGS: Normal breath sounds bilaterally, no wheezing, rales,rhonchi or crepitation. No use of accessory muscles of respiration.  CARDIOVASCULAR: S1, S2 normal. No murmurs, rubs, or gallops.  ABDOMEN: Soft, non-tender, non-distended. Bowel sounds present. No organomegaly or mass.  EXTREMITIES: No pedal edema, cyanosis, or clubbing.  NEUROLOGIC: Cranial nerves II through XII are intact. Muscle strength 5/5 in all extremities. Sensation intact. Gait not  checked. weak PSYCHIATRIC: The patient is alert and awake SKIN: No obvious rash, lesion, or ulcer.   DATA REVIEW:   CBC  Recent Labs  Lab 09/03/18 1033  WBC 8.6  HGB 12.0  HCT 38.6  PLT 337    Chemistries  Recent Labs  Lab 09/05/18 0412  NA 142  K 3.3*  CL 115*  CO2 21*  GLUCOSE 71  BUN 11  CREATININE 1.09*  CALCIUM 7.9*    Microbiology Results   No results found for this or any previous visit (from the past 240 hour(s)).  RADIOLOGY:  [place] Renal  Result Date: 09/03/2018 CLINICAL DATA:  Acute renal failure EXAM: RENAL / URINARY TRACT ULTRASOUND COMPLETE COMPARISON:  None. FINDINGS: Right Kidney: Renal measurements: 7.9 x 3.2 x 3.3 cm = volume: 44.6 mL. Increased cortical echogenicity. Left Kidney: Renal measurements: 7.7 x 4.5 x 3.3 cm = volume: 61.5 mL. Echogenicity within normal limits. No mass or hydronephrosis visualized. Bladder: Appears normal for degree of bladder distention. IMPRESSION: Small echogenic kidneys consistent with medical renal disease. No hydronephrosis. Electronically Signed   By: Gerome Samavid  Williams III M.D   On: 09/03/2018 17:44   [place] Venous Img Lower Unilateral Right  Result Date: 09/03/2018 CLINICAL DATA:  Right lower extremity pain and edema. Evaluate for DVT. EXAM: RIGHT LOWER EXTREMITY VENOUS DOPPLER ULTRASOUND TECHNIQUE: Gray-scale sonography with graded compression, as well as color Doppler and duplex ultrasound were performed to evaluate the lower extremity deep venous systems from the level of the common femoral vein and including the common femoral, femoral, profunda femoral, popliteal and calf veins including the posterior tibial, peroneal and gastrocnemius veins when visible. The superficial great saphenous vein was also interrogated. Spectral Doppler was utilized to evaluate flow at rest and with distal augmentation maneuvers in the common femoral, femoral and popliteal veins. COMPARISON:  None. FINDINGS: Contralateral Common Femoral Vein:  Respiratory phasicity is normal and symmetric with the symptomatic side. No evidence of thrombus. Normal compressibility. Common Femoral Vein: No evidence of thrombus. Normal compressibility, respiratory phasicity and response to augmentation. Saphenofemoral Junction: No evidence of thrombus. Normal compressibility and flow on color Doppler imaging. Profunda Femoral Vein: No evidence of thrombus. Normal compressibility and flow on color Doppler imaging. Femoral Vein: There is a very minimal amount nonocclusive echogenic wall thickening/mural calcification involving the proximal aspect of the right superficial femoral vein. No evidence of thrombus. Normal compressibility, respiratory phasicity and response to augmentation. Popliteal Vein: No evidence of thrombus. Normal compressibility, respiratory phasicity and response to augmentation. Calf Veins: No evidence of thrombus. Normal compressibility and flow on color Doppler imaging. Superficial Great Saphenous Vein: No evidence of thrombus. Normal compressibility.  Venous Reflux:  None. Other Findings:  None. IMPRESSION: 1. No evidence of acute DVT within the right lower extremity. 2. Minimal amount of nonocclusive echogenic wall thickening/mural calcification involving the proximal aspect of the right superficial femoral vein, nonspecific though potentially the sequela of prior DVT. Electronically Signed   By: Simonne ComeJohn  Watts M.D.   On: 09/03/2018 12:21   Dg Chest Portable 1 View  Result Date: 09/03/2018 CLINICAL DATA:  Unresponsiveness. EXAM: PORTABLE CHEST 1 VIEW COMPARISON:  07/15/2018; 03/18/2018; 05/24/2015 FINDINGS: Grossly unchanged cardiac silhouette and mediastinal contours with atherosclerotic plaque when thoracic aorta. Veiling opacities overlying the bilateral lower lungs are favored to represent overlying soft tissues. Minimal left basilar opacities favored to represent atelectasis, unchanged. No new focal airspace opacities. No pleural effusion or  pneumothorax. No evidence of edema. Vascular calcifications overlie the lung apices bilaterally. No acute osseous abnormalities. A peripheral IV overlies the right antecubital fossa. IMPRESSION: 1. No acute cardiopulmonary disease on this AP portable examination. 2.  Aortic Atherosclerosis (ICD10-I70.0). Electronically Signed   By: Simonne ComeJohn  Watts M.D.   On: 09/03/2018 12:27     CODE STATUS:     Code Status Orders  (From admission, onward)         Start     Ordered   09/03/18 1447  Full code  Continuous     09/03/18 1446        Code Status History    Date Active Date Inactive Code Status Order ID Comments User Context   08/21/2018 1422 08/23/2018 1455 DNR [id number]  Willy Eddyobinson, Patrick, MD ED   07/15/2018 1522 07/17/2018 1923 Full Code [id number]  Ihor AustinPyreddy, Pavan, MD Inpatient   03/18/2018 1145 03/20/2018 1722 Full Code [id number]  Bertrum SolSalary, Montell D, MD ED   10/16/2015 0916 10/17/2015 1938 Full Code [id number]  Hower, Cletis Athensavid K, MD ED   05/24/2015 1109 05/31/2015 1833 Full Code [id number]  Gale JourneyWalsh, Catherine P, MD Inpatient   09/23/2011 0031 09/28/2011 1933 Full Code [id number]  Garnett-Mellinger, Vennie HomansEdna Minnie, RN Inpatient    Advance Directive Documentation     Most Recent Value  Type of Advance Directive  Living will, Healthcare Power of Attorney  Pre-existing out of facility DNR order (yellow form or pink MOST form)  -  "MOST" Form in Place?  -      TOTAL TIME TAKING CARE OF THIS PATIENT: *40* minutes.    Enedina FinnerSona Edenilson Austad M.D on 09/05/2018 at 9:04 AM  Between 7am to 6pm - Pager - [phone] After 6pm go to [email or web address] - Social research officer, governmentpassword [email or web address]  [facility]  Office  [phone]  CC: Primary care physician; Karie SchwalbeLetvak, Richard I, MD

## 2018-09-05 NOTE — Consult Note (Signed)
WOC Nurse wound consult note Reason for Consult: Bilateral deep tissue pressure injuries to heels R>L Wound type:Pressure Pressure Injury POA: Yes Measurement: Right medial heel:  3.5cm x 4cm purple/maroon area of discoloration Left posterior heel:  3cm x 4cm area of purple/red discoloration Wound bed:N/A Drainage (amount, consistency, odor) None Periwound: intact Dressing procedure/placement/frequency:Nursing staff has applied silicone foam dressings to heels.  I will  Add bilateral heel boot for her to take with her to the facility.  WOC nursing team will not follow, but will remain available to this patient, the nursing and medical teams.  Please re-consult if needed. Thanks, Ladona Mow, MSN, RN, GNP, [place]  Pager# [phone]

## 2018-09-05 NOTE — Progress Notes (Signed)
Pt d/c back to [facility] via EMS. IVs removed intact. VSS. Report given to [facility].

## 2018-09-05 NOTE — Clinical Social Work Note (Signed)
Patient is medically ready for discharge back to [facility] today. CSW notified patient's daughter [name] [phone] of discharge today. CSW also notified Sue Lush at [facility] of discharge today. Patient will be transported by EMS. RN to call report and call for transport.   Ruthe Mannan MSW, [address] [phone]

## 2018-09-06 ENCOUNTER — Other Ambulatory Visit: Payer: Self-pay | Admitting: Oncology

## 2018-09-06 DIAGNOSIS — I959 Hypotension, unspecified: Secondary | ICD-10-CM | POA: Diagnosis not present

## 2018-09-06 DIAGNOSIS — D500 Iron deficiency anemia secondary to blood loss (chronic): Secondary | ICD-10-CM

## 2018-09-06 DIAGNOSIS — K922 Gastrointestinal hemorrhage, unspecified: Secondary | ICD-10-CM | POA: Diagnosis not present

## 2018-09-06 DIAGNOSIS — N183 Chronic kidney disease, stage 3 (moderate): Secondary | ICD-10-CM | POA: Diagnosis not present

## 2018-09-07 LAB — URINE CULTURE
Culture: >=100000 — AB
Special Requests: NORMAL

## 2018-09-08 ENCOUNTER — Inpatient Hospital Stay: Payer: Medicare Other

## 2018-09-08 VITALS — BP 136/59 | HR 89 | Resp 20

## 2018-09-08 DIAGNOSIS — F0391 Unspecified dementia with behavioral disturbance: Secondary | ICD-10-CM | POA: Diagnosis not present

## 2018-09-08 DIAGNOSIS — Z7982 Long term (current) use of aspirin: Secondary | ICD-10-CM | POA: Diagnosis not present

## 2018-09-08 DIAGNOSIS — D500 Iron deficiency anemia secondary to blood loss (chronic): Secondary | ICD-10-CM

## 2018-09-08 DIAGNOSIS — Z79899 Other long term (current) drug therapy: Secondary | ICD-10-CM | POA: Diagnosis not present

## 2018-09-08 DIAGNOSIS — K2901 Acute gastritis with bleeding: Secondary | ICD-10-CM | POA: Diagnosis not present

## 2018-09-08 MED ORDER — IRON SUCROSE 20 MG/ML IV SOLN
200.0000 mg | Freq: Once | INTRAVENOUS | Status: AC
  Administered 2018-09-08: 200 mg via INTRAVENOUS
  Filled 2018-09-08: qty 10

## 2018-09-08 MED ORDER — SODIUM CHLORIDE 0.9 % IV SOLN
Freq: Once | INTRAVENOUS | Status: AC
  Administered 2018-09-08: 14:00:00 via INTRAVENOUS
  Filled 2018-09-08: qty 250

## 2018-09-08 MED ORDER — SODIUM CHLORIDE 0.9 % IV SOLN: 200.0000 mg | Freq: Once | INTRAVENOUS | Status: DC

## 2018-09-15 ENCOUNTER — Inpatient Hospital Stay: Payer: Medicare Other

## 2018-09-15 VITALS — BP 114/78 | HR 78 | Temp 97.3°F | Resp 16

## 2018-09-15 DIAGNOSIS — D500 Iron deficiency anemia secondary to blood loss (chronic): Secondary | ICD-10-CM | POA: Diagnosis not present

## 2018-09-15 MED ORDER — IRON SUCROSE 20 MG/ML IV SOLN
200.0000 mg | Freq: Once | INTRAVENOUS | Status: AC
  Administered 2018-09-15: 200 mg via INTRAVENOUS
  Filled 2018-09-15: qty 10

## 2018-09-15 MED ORDER — SODIUM CHLORIDE 0.9 % IV SOLN
Freq: Once | INTRAVENOUS | Status: AC
  Administered 2018-09-15: 15:00:00 via INTRAVENOUS
  Filled 2018-09-15: qty 250

## 2018-09-22 ENCOUNTER — Inpatient Hospital Stay: Payer: Medicare Other | Attending: Oncology

## 2018-09-22 VITALS — BP 127/82 | HR 76 | Temp 96.3°F | Resp 18

## 2018-09-22 DIAGNOSIS — K2901 Acute gastritis with bleeding: Secondary | ICD-10-CM | POA: Diagnosis not present

## 2018-09-22 DIAGNOSIS — F0391 Unspecified dementia with behavioral disturbance: Secondary | ICD-10-CM | POA: Diagnosis not present

## 2018-09-22 DIAGNOSIS — Z79899 Other long term (current) drug therapy: Secondary | ICD-10-CM | POA: Diagnosis not present

## 2018-09-22 DIAGNOSIS — Z7982 Long term (current) use of aspirin: Secondary | ICD-10-CM | POA: Diagnosis not present

## 2018-09-22 DIAGNOSIS — D500 Iron deficiency anemia secondary to blood loss (chronic): Secondary | ICD-10-CM | POA: Diagnosis present

## 2018-09-22 MED ORDER — SODIUM CHLORIDE 0.9 % IV SOLN
Freq: Once | INTRAVENOUS | Status: AC
  Administered 2018-09-22: 14:00:00 via INTRAVENOUS
  Filled 2018-09-22: qty 250

## 2018-09-22 MED ORDER — IRON SUCROSE 20 MG/ML IV SOLN
200.0000 mg | Freq: Once | INTRAVENOUS | Status: AC
  Administered 2018-09-22: 200 mg via INTRAVENOUS
  Filled 2018-09-22: qty 10

## 2018-09-29 ENCOUNTER — Other Ambulatory Visit: Payer: Self-pay

## 2018-09-29 ENCOUNTER — Inpatient Hospital Stay: Payer: Medicare Other

## 2018-09-29 VITALS — BP 120/81 | HR 72 | Temp 97.7°F | Resp 18

## 2018-09-29 DIAGNOSIS — D500 Iron deficiency anemia secondary to blood loss (chronic): Secondary | ICD-10-CM

## 2018-09-29 MED ORDER — SODIUM CHLORIDE 0.9 % IV SOLN
Freq: Once | INTRAVENOUS | Status: AC
  Administered 2018-09-29: 14:00:00 via INTRAVENOUS
  Filled 2018-09-29: qty 250

## 2018-09-29 MED ORDER — IRON SUCROSE 20 MG/ML IV SOLN
200.0000 mg | Freq: Once | INTRAVENOUS | Status: AC
  Administered 2018-09-29: 200 mg via INTRAVENOUS
  Filled 2018-09-29: qty 10

## 2018-10-06 ENCOUNTER — Ambulatory Visit: Payer: Medicare Other

## 2018-10-13 DIAGNOSIS — E441 Mild protein-calorie malnutrition: Secondary | ICD-10-CM | POA: Diagnosis not present

## 2018-10-26 ENCOUNTER — Inpatient Hospital Stay: Payer: Medicare Other

## 2018-10-27 ENCOUNTER — Inpatient Hospital Stay
Admission: EM | Admit: 2018-10-27 | Discharge: 2018-10-31 | DRG: 871 | Disposition: A | Discharge status: Skilled Nursing Facility | Payer: Medicare Other | Attending: Internal Medicine | Admitting: Internal Medicine

## 2018-10-27 ENCOUNTER — Inpatient Hospital Stay: Payer: Medicare Other | Admitting: Oncology

## 2018-10-27 ENCOUNTER — Emergency Department: Payer: Medicare Other

## 2018-10-27 ENCOUNTER — Other Ambulatory Visit: Payer: Self-pay

## 2018-10-27 ENCOUNTER — Inpatient Hospital Stay: Payer: Medicare Other

## 2018-10-27 DIAGNOSIS — B9620 Unspecified Escherichia coli [E. coli] as the cause of diseases classified elsewhere: Secondary | ICD-10-CM | POA: Diagnosis present

## 2018-10-27 DIAGNOSIS — E039 Hypothyroidism, unspecified: Secondary | ICD-10-CM | POA: Diagnosis present

## 2018-10-27 DIAGNOSIS — N3090 Cystitis, unspecified without hematuria: Secondary | ICD-10-CM | POA: Diagnosis present

## 2018-10-27 DIAGNOSIS — E876 Hypokalemia: Secondary | ICD-10-CM | POA: Diagnosis present

## 2018-10-27 DIAGNOSIS — Z8673 Personal history of transient ischemic attack (TIA), and cerebral infarction without residual deficits: Secondary | ICD-10-CM

## 2018-10-27 DIAGNOSIS — K5900 Constipation, unspecified: Secondary | ICD-10-CM | POA: Diagnosis present

## 2018-10-27 DIAGNOSIS — I2510 Atherosclerotic heart disease of native coronary artery without angina pectoris: Secondary | ICD-10-CM | POA: Diagnosis present

## 2018-10-27 DIAGNOSIS — K219 Gastro-esophageal reflux disease without esophagitis: Secondary | ICD-10-CM | POA: Diagnosis present

## 2018-10-27 DIAGNOSIS — A419 Sepsis, unspecified organism: Principal | ICD-10-CM | POA: Diagnosis present

## 2018-10-27 DIAGNOSIS — Z833 Family history of diabetes mellitus: Secondary | ICD-10-CM

## 2018-10-27 DIAGNOSIS — Z79890 Hormone replacement therapy (postmenopausal): Secondary | ICD-10-CM

## 2018-10-27 DIAGNOSIS — N182 Chronic kidney disease, stage 2 (mild): Secondary | ICD-10-CM | POA: Diagnosis present

## 2018-10-27 DIAGNOSIS — Z794 Long term (current) use of insulin: Secondary | ICD-10-CM

## 2018-10-27 DIAGNOSIS — Z7982 Long term (current) use of aspirin: Secondary | ICD-10-CM

## 2018-10-27 DIAGNOSIS — D509 Iron deficiency anemia, unspecified: Secondary | ICD-10-CM | POA: Diagnosis present

## 2018-10-27 DIAGNOSIS — Z8719 Personal history of other diseases of the digestive system: Secondary | ICD-10-CM

## 2018-10-27 DIAGNOSIS — R571 Hypovolemic shock: Secondary | ICD-10-CM

## 2018-10-27 DIAGNOSIS — F0390 Unspecified dementia without behavioral disturbance: Secondary | ICD-10-CM | POA: Diagnosis present

## 2018-10-27 DIAGNOSIS — E785 Hyperlipidemia, unspecified: Secondary | ICD-10-CM | POA: Diagnosis present

## 2018-10-27 DIAGNOSIS — E872 Acidosis, unspecified: Secondary | ICD-10-CM

## 2018-10-27 DIAGNOSIS — N179 Acute kidney failure, unspecified: Secondary | ICD-10-CM | POA: Diagnosis present

## 2018-10-27 DIAGNOSIS — B961 Klebsiella pneumoniae [K. pneumoniae] as the cause of diseases classified elsewhere: Secondary | ICD-10-CM | POA: Diagnosis present

## 2018-10-27 DIAGNOSIS — E1122 Type 2 diabetes mellitus with diabetic chronic kidney disease: Secondary | ICD-10-CM | POA: Diagnosis present

## 2018-10-27 DIAGNOSIS — Z87891 Personal history of nicotine dependence: Secondary | ICD-10-CM

## 2018-10-27 DIAGNOSIS — Z8249 Family history of ischemic heart disease and other diseases of the circulatory system: Secondary | ICD-10-CM

## 2018-10-27 DIAGNOSIS — D649 Anemia, unspecified: Secondary | ICD-10-CM | POA: Diagnosis present

## 2018-10-27 DIAGNOSIS — R6521 Severe sepsis with septic shock: Secondary | ICD-10-CM | POA: Diagnosis present

## 2018-10-27 DIAGNOSIS — H3530 Unspecified macular degeneration: Secondary | ICD-10-CM | POA: Diagnosis present

## 2018-10-27 DIAGNOSIS — Z1612 Extended spectrum beta lactamase (ESBL) resistance: Secondary | ICD-10-CM | POA: Diagnosis present

## 2018-10-27 DIAGNOSIS — E870 Hyperosmolality and hypernatremia: Secondary | ICD-10-CM | POA: Diagnosis present

## 2018-10-27 DIAGNOSIS — I129 Hypertensive chronic kidney disease with stage 1 through stage 4 chronic kidney disease, or unspecified chronic kidney disease: Secondary | ICD-10-CM | POA: Diagnosis present

## 2018-10-27 DIAGNOSIS — Z79899 Other long term (current) drug therapy: Secondary | ICD-10-CM

## 2018-10-27 LAB — CBC WITH DIFFERENTIAL/PLATELET
Abs Immature Granulocytes: 0.03 10*3/uL (ref 0.00–0.07)
Basophils Absolute: 0 10*3/uL (ref 0.0–0.1)
Basophils Relative: 0 %
Eosinophils Absolute: 0.1 10*3/uL (ref 0.0–0.5)
Eosinophils Relative: 1 %
HCT: 46.1 % — ABNORMAL HIGH (ref 36.0–46.0)
Hemoglobin: 14.9 g/dL (ref 12.0–15.0)
Immature Granulocytes: 0 %
Lymphocytes Relative: 30 %
Lymphs Abs: 2.9 10*3/uL (ref 0.7–4.0)
MCH: 26.2 pg (ref 26.0–34.0)
MCHC: 32.3 g/dL (ref 30.0–36.0)
MCV: 81 fL (ref 80.0–100.0)
Monocytes Absolute: 0.7 10*3/uL (ref 0.1–1.0)
Monocytes Relative: 8 %
Neutro Abs: 5.7 10*3/uL (ref 1.7–7.7)
Neutrophils Relative %: 61 %
Platelets: 266 10*3/uL (ref 150–400)
RBC: 5.69 MIL/uL — ABNORMAL HIGH (ref 3.87–5.11)
RDW: 17.7 % — ABNORMAL HIGH (ref 11.5–15.5)
WBC: 9.4 10*3/uL (ref 4.0–10.5)
nRBC: 0 % (ref 0.0–0.2)

## 2018-10-27 LAB — COMPREHENSIVE METABOLIC PANEL
ALT: 17 U/L (ref 0–44)
AST: 26 U/L (ref 15–41)
Albumin: 3.9 g/dL (ref 3.5–5.0)
Alkaline Phosphatase: 87 U/L (ref 38–126)
Anion gap: 19 — ABNORMAL HIGH (ref 5–15)
BUN: 42 mg/dL — ABNORMAL HIGH (ref 8–23)
CO2: 27 mmol/L (ref 22–32)
Calcium: 9.8 mg/dL (ref 8.9–10.3)
Chloride: 98 mmol/L (ref 98–111)
Creatinine, Ser: 3.77 mg/dL — ABNORMAL HIGH (ref 0.44–1.00)
GFR calc Af Amer: 13 mL/min — ABNORMAL LOW (ref >60)
GFR calc non Af Amer: 11 mL/min — ABNORMAL LOW (ref >60)
Glucose, Bld: 218 mg/dL — ABNORMAL HIGH (ref 70–99)
Potassium: 3.5 mmol/L (ref 3.5–5.1)
Sodium: 144 mmol/L (ref 135–145)
Total Bilirubin: 0.7 mg/dL (ref 0.3–1.2)
Total Protein: 7.6 g/dL (ref 6.5–8.1)

## 2018-10-27 LAB — URINALYSIS, COMPLETE (UACMP) WITH MICROSCOPIC
Bilirubin Urine: NEGATIVE
Glucose, UA: >=500 mg/dL — AB
Ketones, ur: NEGATIVE mg/dL
Nitrite: NEGATIVE
Protein, ur: 30 mg/dL — AB
Specific Gravity, Urine: 1.014 (ref 1.005–1.030)
WBC, UA: >50 WBC/hpf — ABNORMAL HIGH (ref 0–5)
pH: 5 (ref 5.0–8.0)

## 2018-10-27 LAB — PROTIME-INR
INR: 1.1 (ref 0.8–1.2)
Prothrombin Time: 13.9 seconds (ref 11.4–15.2)

## 2018-10-27 LAB — GLUCOSE, CAPILLARY
Glucose-Capillary: 176 mg/dL — ABNORMAL HIGH (ref 70–99)
Glucose-Capillary: 48 mg/dL — ABNORMAL LOW (ref 70–99)
Glucose-Capillary: 159 mg/dL — ABNORMAL HIGH (ref 70–99)

## 2018-10-27 LAB — LIPASE, BLOOD: Lipase: 25 U/L (ref 11–51)

## 2018-10-27 LAB — LACTIC ACID, PLASMA
Lactic Acid, Venous: 5.1 mmol/L (ref 0.5–1.9)
Lactic Acid, Venous: 3.7 mmol/L (ref 0.5–1.9)
Lactic Acid, Venous: 2.7 mmol/L (ref 0.5–1.9)

## 2018-10-27 LAB — APTT: aPTT: <24 seconds — ABNORMAL LOW (ref 24–36)

## 2018-10-27 LAB — PROCALCITONIN: Procalcitonin: 0.74 ng/mL

## 2018-10-27 MED ORDER — INSULIN GLARGINE 100 UNIT/ML ~~LOC~~ SOLN
10.0000 [IU] | Freq: Every day | SUBCUTANEOUS | Status: DC
  Filled 2018-10-27 (×2): qty 0.1

## 2018-10-27 MED ORDER — METRONIDAZOLE IN NACL 5-0.79 MG/ML-% IV SOLN
500.0000 mg | Freq: Once | INTRAVENOUS | Status: AC
  Administered 2018-10-27: 500 mg via INTRAVENOUS
  Filled 2018-10-27: qty 100

## 2018-10-27 MED ORDER — SODIUM CHLORIDE 0.9 % IV SOLN
INTRAVENOUS | Status: DC
  Administered 2018-10-27: 15:00:00 via INTRAVENOUS

## 2018-10-27 MED ORDER — ONDANSETRON HCL 4 MG PO TABS: 4.0000 mg | ORAL_TABLET | Freq: Four times a day (QID) | ORAL | Status: DC | PRN

## 2018-10-27 MED ORDER — SODIUM CHLORIDE 0.9 % IV SOLN
1.0000 g | INTRAVENOUS | Status: DC
  Administered 2018-10-28: 1 g via INTRAVENOUS
  Filled 2018-10-27 (×2): qty 1

## 2018-10-27 MED ORDER — PANTOPRAZOLE SODIUM 40 MG PO TBEC
40.0000 mg | DELAYED_RELEASE_TABLET | Freq: Two times a day (BID) | ORAL | Status: DC
  Administered 2018-10-28 – 2018-10-31 (×5): 40 mg via ORAL
  Filled 2018-10-27 (×8): qty 1

## 2018-10-27 MED ORDER — INSULIN ASPART 100 UNIT/ML ~~LOC~~ SOLN
0.0000 [IU] | Freq: Every day | SUBCUTANEOUS | Status: DC
  Filled 2018-10-27: qty 1

## 2018-10-27 MED ORDER — VANCOMYCIN HCL 500 MG IV SOLR
500.0000 mg | INTRAVENOUS | Status: DC
  Filled 2018-10-27: qty 500

## 2018-10-27 MED ORDER — ASPIRIN 81 MG PO CHEW
81.0000 mg | CHEWABLE_TABLET | Freq: Every day | ORAL | Status: DC
  Administered 2018-10-28 – 2018-10-31 (×3): 81 mg via ORAL
  Filled 2018-10-27 (×4): qty 1

## 2018-10-27 MED ORDER — SODIUM CHLORIDE 0.9 % IV SOLN
2.0000 g | Freq: Once | INTRAVENOUS | Status: AC
  Administered 2018-10-27: 2 g via INTRAVENOUS
  Filled 2018-10-27: qty 2

## 2018-10-27 MED ORDER — ENOXAPARIN SODIUM 40 MG/0.4ML ~~LOC~~ SOLN: 40.0000 mg | SUBCUTANEOUS | Status: DC

## 2018-10-27 MED ORDER — SODIUM CHLORIDE 0.9 % IV BOLUS (SEPSIS): 250.0000 mL | Freq: Once | INTRAVENOUS | Status: DC

## 2018-10-27 MED ORDER — DEXTROSE 50 % IV SOLN
25.0000 g | INTRAVENOUS | Status: AC
  Administered 2018-10-27: 25 g via INTRAVENOUS
  Filled 2018-10-27: qty 50

## 2018-10-27 MED ORDER — SODIUM CHLORIDE 0.9 % IV BOLUS (SEPSIS)
1000.0000 mL | Freq: Once | INTRAVENOUS | Status: AC
  Administered 2018-10-27: 1000 mL via INTRAVENOUS

## 2018-10-27 MED ORDER — HEPARIN SODIUM (PORCINE) 5000 UNIT/ML IJ SOLN
5000.0000 [IU] | Freq: Two times a day (BID) | INTRAMUSCULAR | Status: DC
  Administered 2018-10-27 – 2018-10-31 (×6): 5000 [IU] via SUBCUTANEOUS
  Filled 2018-10-27 (×8): qty 1

## 2018-10-27 MED ORDER — MEMANTINE HCL 10 MG PO TABS
10.0000 mg | ORAL_TABLET | Freq: Two times a day (BID) | ORAL | Status: DC
  Administered 2018-10-27 – 2018-10-31 (×8): 10 mg via ORAL
  Filled 2018-10-27 (×10): qty 1

## 2018-10-27 MED ORDER — PRAVASTATIN SODIUM 20 MG PO TABS
20.0000 mg | ORAL_TABLET | Freq: Every day | ORAL | Status: DC
  Administered 2018-10-28 – 2018-10-30 (×2): 20 mg via ORAL
  Filled 2018-10-27 (×4): qty 1

## 2018-10-27 MED ORDER — LEVOTHYROXINE SODIUM 88 MCG PO TABS
88.0000 ug | ORAL_TABLET | Freq: Every evening | ORAL | Status: DC
  Administered 2018-10-28 – 2018-10-31 (×2): 88 ug via ORAL
  Filled 2018-10-27 (×4): qty 1

## 2018-10-27 MED ORDER — SENNOSIDES-DOCUSATE SODIUM 8.6-50 MG PO TABS: 1.0000 | ORAL_TABLET | Freq: Every evening | ORAL | Status: DC | PRN

## 2018-10-27 MED ORDER — VANCOMYCIN HCL IN DEXTROSE 1-5 GM/200ML-% IV SOLN
1000.0000 mg | Freq: Once | INTRAVENOUS | Status: AC
  Administered 2018-10-27: 1000 mg via INTRAVENOUS
  Filled 2018-10-27: qty 200

## 2018-10-27 MED ORDER — ACETAMINOPHEN 650 MG RE SUPP: 650.0000 mg | Freq: Four times a day (QID) | RECTAL | Status: DC | PRN

## 2018-10-27 MED ORDER — ONDANSETRON HCL 4 MG/2ML IJ SOLN: 4.0000 mg | Freq: Four times a day (QID) | INTRAMUSCULAR | Status: DC | PRN

## 2018-10-27 MED ORDER — INSULIN ASPART 100 UNIT/ML ~~LOC~~ SOLN
0.0000 [IU] | Freq: Three times a day (TID) | SUBCUTANEOUS | Status: DC
  Administered 2018-10-27: 2 [IU] via SUBCUTANEOUS
  Administered 2018-10-28 – 2018-10-29 (×3): 1 [IU] via SUBCUTANEOUS
  Administered 2018-10-30 (×2): 3 [IU] via SUBCUTANEOUS
  Administered 2018-10-30: 1 [IU] via SUBCUTANEOUS
  Administered 2018-10-31 (×2): 3 [IU] via SUBCUTANEOUS
  Filled 2018-10-27 (×8): qty 1

## 2018-10-27 MED ORDER — DEXTROSE-NACL 5-0.9 % IV SOLN
INTRAVENOUS | Status: DC
  Administered 2018-10-27 – 2018-10-29 (×3): via INTRAVENOUS

## 2018-10-27 MED ORDER — ACETAMINOPHEN 325 MG PO TABS: 650.0000 mg | ORAL_TABLET | Freq: Four times a day (QID) | ORAL | Status: DC | PRN

## 2018-10-27 NOTE — ED Triage Notes (Signed)
Pt in by EMS from [facility] for altered mental status, EMS reports declining health over the past few monthsn

## 2018-10-27 NOTE — Consult Note (Signed)
Pharmacy Antibiotic Note  [name] [name] is a 78 y.o. female admitted on 10/27/2018 with sepsis.  Pharmacy has been consulted for Vancomycin/Cefepime dosing.  Plan: Patient will receive loading dose of 1000 mg IV Vancomycin in the ED, followed by   Vancomycin 500 mg IV Q 48 hrs. Goal AUC 400-550. Expected AUC: 476 SCr used: 3.77  Patient in AKI with baseline Scr ~ 1.1 - will monitor daily for dosing adjustments until stable  Pt received Cefepime 2g IV x 1 in the ED - will follow with  Cefepime 1g q 24hr   Height: 5' (152.4 cm) Weight: 137 lb (62.1 kg) IBW/kg (Calculated) : 45.5  Temp (24hrs), Avg:98 F (36.7 C), Min:98 F (36.7 C), Max:98 F (36.7 C)  Recent Labs  Lab 10/27/18 1023  WBC 9.4  CREATININE 3.77*  LATICACIDVEN 5.1*    Estimated Creatinine Clearance: 10.1 mL/min (A) (by C-G formula based on SCr of 3.77 mg/dL (H)).    Allergies  Allergen Reactions  . Fexofenadine Hcl Rash    Antimicrobials this admission: Cefepime 4/9 >> Vancomycin 4/9 >> Metronidazole 4/9 x 1  Dose adjustments this admission: None  Microbiology results: 4/9 BCx: pending 4/9 UCx: pending   Thank you for allowing pharmacy to be a part of this patient's care.  Albina Billet, PharmD, BCPS Clinical Pharmacist 10/27/2018 12:23 PM

## 2018-10-27 NOTE — H&P (Signed)
[facility] - [place] at [facility]   PATIENT NAME: [name] [name]    MR#:  [id number]  DATE OF BIRTH:  1941/03/11  DATE OF ADMISSION:  10/27/2018  PRIMARY CARE PHYSICIAN: Karie Schwalbe, MD   REQUESTING/REFERRING PHYSICIAN:   CHIEF COMPLAINT:   Chief Complaint  Patient presents with  . Altered Mental Status   es mellitus, hyperlipidemia, hypertension,, GERD is a resident of [facility] facility. HISTORY OF PRESENT ILLNESS: [name] [name]  is a 78 y.o. female with a known history of coronary artery disease, CVA, dementia, type 2 diabet  Patient was referred for low blood pressure and decreased responsiveness.  Patient has poor oral intake for the last couple of days.  No history of any cough, fever.  Blood pressure was low.  Patient was evaluated and in the emergency room lactic acid level was high code sepsis was called and patient was started on fluid resuscitation.  Blood pressure started improving after fluid resuscitation was started.  It also became more responsive.  Patient received with broad-spectrum IV vancomycin and cefepime antibiotics.  According to the nursing home transfer note no history of any sore throat, shortness of breath, cough.  PAST MEDICAL HISTORY:   Past Medical History:  Diagnosis Date  . Allergic rhinitis   . CAD (coronary artery disease)   . Carotid artery occlusion 02/15/2009  . CVA (cerebral infarction)   . Dementia (HCC)   . Diabetes mellitus   . Gastritis   . GERD (gastroesophageal reflux disease)   . Heart murmur   . Hematemesis   . Hemiplegia (HCC)   . HLD (hyperlipidemia)   . Hypertension   . Hypertensive chronic kidney disease   . Macular degeneration   . Persistent mood (affective) disorder, unspecified (HCC)   . Stroke (HCC)   . Thyroid disease    hypothyroidism  . Ulcer of esophagus without bleeding   Chronic kidney disease stage II  PAST SURGICAL HISTORY:  Past Surgical History:  Procedure Laterality  Date  . APPENDECTOMY    . ESOPHAGOGASTRODUODENOSCOPY Left 10/17/2015   Procedure: ESOPHAGOGASTRODUODENOSCOPY (EGD);  Surgeon: Scot Jun, MD;  Location: [facility] ENDOSCOPY;  Service: Endoscopy;  Laterality: Left;  . ESOPHAGOGASTRODUODENOSCOPY N/A 08/22/2018   Procedure: ESOPHAGOGASTRODUODENOSCOPY (EGD);  Surgeon: [name], MD;  Location: [facility] ENDOSCOPY;  Service: Gastroenterology;  Laterality: N/A;  . TONSILLECTOMY      SOCIAL HISTORY:  Social History   Tobacco Use  . Smoking status: Former Smoker    Types: Cigarettes    Last attempt to quit: 07/20/1985    Years since quitting: 33.2  . Smokeless tobacco: Never Used  Substance Use Topics  . Alcohol use: No    FAMILY HISTORY:  Family History  Problem Relation Age of Onset  . Diabetes Mother   . Hypertension Mother   . Diabetes Father   . Hypertension Father   . Diabetes Sister   . Hypertension Sister     DRUG ALLERGIES:  Allergies  Allergen Reactions  . Fexofenadine Hcl Rash    REVIEW OF SYSTEMS:  Patient is confused and lethargic unable to give much history  MEDICATIONS AT HOME:  Prior to Admission medications   Medication Sig Start Date End Date Taking? Authorizing Provider  aluminum hydroxide [facility]) ointment Apply topically every 12 (twelve) hours as needed. Skin protectant to buttocks    [provider]  aspirin 81 MG chewable tablet Chew 81 mg by mouth daily.    [provider]  Cholecalciferol (  VITAMIN D3) 50000 units CAPS Take 1 capsule by mouth every 30 (thirty) days.     [provider]  citalopram (CELEXA) 10 MG tablet Take 5 mg by mouth daily.     [provider]  gabapentin (NEURONTIN) 300 MG capsule Take 300 mg by mouth every evening.     [provider]  guaiFENesin (ROBITUSSIN) 100 MG/5ML liquid Take 200 mg by mouth every 4 (four) hours as needed for cough.    [provider]  hydroxypropyl methylcellulose / hypromellose (ISOPTO TEARS /  GONIOVISC) 2.5 % ophthalmic solution Place 1 drop into both eyes 3 (three) times daily as needed for dry eyes.    [provider]  insulin glargine (LANTUS) 100 UNIT/ML injection Inject 0.1 mLs (10 Units total) into the skin daily. 09/05/18   Enedina FinnerPatel, Sona, MD  levothyroxine (SYNTHROID, LEVOTHROID) 88 MCG tablet Take 88 mcg by mouth every evening.     [provider]  lovastatin (MEVACOR) 20 MG tablet Take 20 mg by mouth every evening.     [provider]  memantine (NAMENDA) 10 MG tablet Take 10 mg by mouth 2 (two) times daily.    [provider]  Menthol (RICOLA CHERRY HONEY HERB) 2 MG LOZG Use as directed 1 lozenge in the mouth or throat every 2 (two) hours as needed (FOR COUGH).    [provider]  metoprolol succinate (TOPROL-XL) 25 MG 24 hr tablet Take 25 mg by mouth daily.    [provider]  ondansetron (ZOFRAN) 4 MG tablet Take 4 mg by mouth daily as needed for nausea or vomiting.     [provider]  pantoprazole (PROTONIX) 40 MG tablet Take 1 tablet (40 mg total) by mouth 2 (two) times daily before a meal. 08/23/18   Altamese DillingVachhani, Vaibhavkumar, MD  polyethylene glycol (MIRALAX / GLYCOLAX) packet Take 17 g by mouth daily.    [provider]  potassium chloride (K-DUR) 10 MEQ tablet Take 10 mEq by mouth daily.     [provider]  senna-docusate (SENOKOT-S) 8.6-50 MG tablet Take 2 tablets by mouth daily.    [provider]  traZODone (DESYREL) 50 MG tablet Take 25 mg by mouth at bedtime.     [provider]      PHYSICAL EXAMINATION:   VITAL SIGNS: Blood pressure (!) 108/32, pulse 80, temperature 98 F (36.7 C), temperature source Oral, resp. rate 14, height 5' (1.524 m), weight 62.1 kg, SpO2 100 %.  GENERAL:  78 y.o.-year-old patient lying in the bed with decreased responsiveness EYES: Pupils equal, round, reactive to light and accommodation. No scleral icterus. Extraocular muscles intact.   HEENT: Head atraumatic, normocephalic. Oropharynx dry and nasopharynx clear.  NECK:  Supple, no jugular venous distention. No thyroid enlargement, no tenderness.  LUNGS: Normal breath sounds bilaterally, no wheezing, rales,rhonchi or crepitation. No use of accessory muscles of respiration.  CARDIOVASCULAR: S1, S2 normal. No murmurs, rubs, or gallops.  ABDOMEN: Soft, nontender, nondistended. Bowel sounds present. No organomegaly or mass.  EXTREMITIES: No pedal edema, cyanosis, or clubbing.  NEUROLOGIC: Arousable to loud verbal commands Lethargic Complete nervous system exam was not possible PSYCHIATRIC: The patient is alert and oriented x1.  SKIN: No obvious rash, lesion, or ulcer.   LABORATORY PANEL:   CBC Recent Labs  Lab 10/27/18 1023  WBC 9.4  HGB 14.9  HCT 46.1*  PLT 266  MCV 81.0  MCH 26.2  MCHC 32.3  RDW 17.7*  LYMPHSABS 2.9  MONOABS 0.7  EOSABS 0.1  BASOSABS 0.0   ------------------------------------------------------------------------------------------------------------------  Chemistries  Recent Labs  Lab 10/27/18 1023  NA 144  K 3.5  CL 98  CO2 27  GLUCOSE 218*  BUN 42*  CREATININE 3.77*  CALCIUM 9.8  AST 26  ALT 17  ALKPHOS 87  BILITOT 0.7   ------------------------------------------------------------------------------------------------------------------ estimated creatinine clearance is 10.1 mL/min (A) (by C-G formula based on SCr of 3.77 mg/dL (H)). ------------------------------------------------------------------------------------------------------------------ No results for input(s): TSH, T4TOTAL, T3FREE, THYROIDAB in the last 72 hours.  Invalid input(s): FREET3   Coagulation profile Recent Labs  Lab 10/27/18 1023  INR 1.1   ------------------------------------------------------------------------------------------------------------------- No results for input(s): DDIMER in the last 72  hours. -------------------------------------------------------------------------------------------------------------------  Cardiac Enzymes No results for input(s): CKMB, TROPONINI, MYOGLOBIN in the last 168 hours.  Invalid input(s): CK ------------------------------------------------------------------------------------------------------------------ Invalid input(s): POCBNP  ---------------------------------------------------------------------------------------------------------------  Urinalysis    Component Value Date/Time   COLORURINE YELLOW (A) 10/27/2018 1024   APPEARANCEUR TURBID (A) 10/27/2018 1024   APPEARANCEUR Clear 11/16/2013 2342   LABSPEC 1.014 10/27/2018 1024   LABSPEC 1.019 11/16/2013 2342   PHURINE 5.0 10/27/2018 1024   GLUCOSEU >=500 (A) 10/27/2018 1024   GLUCOSEU 150 mg/dL 94/01/6807 8110   HGBUR LARGE (A) 10/27/2018 1024   BILIRUBINUR NEGATIVE 10/27/2018 1024   BILIRUBINUR Negative 11/16/2013 2342   KETONESUR NEGATIVE 10/27/2018 1024   PROTEINUR 30 (A) 10/27/2018 1024   UROBILINOGEN 0.2 09/22/2011 1412   NITRITE NEGATIVE 10/27/2018 1024   LEUKOCYTESUR LARGE (A) 10/27/2018 1024   LEUKOCYTESUR Negative 11/16/2013 2342     RADIOLOGY: Ct Head Wo Contrast  Result Date: 10/27/2018 CLINICAL DATA:  Altered level of consciousness.  Sepsis. EXAM: CT HEAD WITHOUT CONTRAST TECHNIQUE: Contiguous axial images were obtained from the base of the skull through the vertex without intravenous contrast. COMPARISON:  CT 09/05/2015 FINDINGS: Brain: Chronic right MCA infarct unchanged. Chronic infarct cerebellum bilaterally unchanged. Chronic microvascular ischemic changes in the white matter. Moderate atrophy. Negative for acute infarct, hemorrhage, or mass lesion. Vascular: Negative for hyperdense vessel Skull: Negative Sinuses/Orbits: Negative Other: None IMPRESSION: Atrophy and extensive chronic ischemic change as above. No acute intracranial abnormality. Electronically Signed    By: Marlan Palau M.D.   On: 10/27/2018 11:27   Dg Chest Port 1 View  Result Date: 10/27/2018 CLINICAL DATA:  Altered mental status EXAM: PORTABLE CHEST 1 VIEW COMPARISON:  09/03/2018 FINDINGS: Heart is borderline in size. No confluent opacities in the lungs. No effusions or edema. Calcifications in the apices bilaterally, likely scarring. No acute bony abnormality. IMPRESSION: No active cardiopulmonary disease. Electronically Signed   By: Charlett Nose M.D.   On: 10/27/2018 10:37    EKG: Orders placed or performed during the hospital encounter of 10/27/18  . EKG 12-Lead  . EKG 12-Lead    IMPRESSION AND PLAN: 78 year old female patient with a known history of coronary artery disease, CVA, dementia, type 2 diabetes mellitus, GERD  was referred for low blood pressure and decreased responsiveness.   -Severe sepsis Admit patient to telemetry IV fluid resuscitation based on sepsis protocol Broad-spectrum IV antibiotics with vancomycin and cefepime Check cultures  follow-up lacticLevel  -Hypotension secondary to sepsis Responding to IV fluids Continue IV fluids based on sepsis protocol  -Urinary tract infection Follow-up cultures Currently on broad-spectrum antibiotic We will narrow antibiotics once sensitivities are out  -DVT prophylaxis subcu heparin daily  -Acute on chronic kidney disease stage II Avoid nephrotoxic medication IV fluid hydration Monitor renal function All the records are reviewed and case discussed with ED  provider. Management plans discussed with the patient, family and they are in agreement.  CODE STATUS: Full code Code Status History    Date Active Date Inactive Code Status Order ID Comments User Context   09/03/2018 1446 09/05/2018 1537 Full Code [id number]  Bertrum Sol, MD Inpatient   08/21/2018 1422 08/23/2018 1455 DNR [id number]  Willy Eddy, MD ED   07/15/2018 1522 07/17/2018 1923 Full Code [id number]  Ihor Austin, MD Inpatient   03/18/2018 1145  03/20/2018 1722 Full Code [id number]  Bertrum Sol, MD ED   10/16/2015 0916 10/17/2015 1938 Full Code [id number]  Hower, Cletis Athens, MD ED   05/24/2015 1109 05/31/2015 1833 Full Code [id number]  Gale Journey, MD Inpatient   09/23/2011 0031 09/28/2011 1933 Full Code [id number]  Garnett-Mellinger, Vennie Homans, RN Inpatient       TOTAL TIME TAKING CARE OF THIS PATIENT: 53 minutes.    Ihor Austin M.D on 10/27/2018 at 12:14 PM  Between 7am to 6pm - Pager - [phone]  After 6pm go to [email or web address] - password [email or web address] [facility]  [facility]  Office  [phone]  CC: Primary care physician; Karie Schwalbe, MD

## 2018-10-27 NOTE — Progress Notes (Addendum)
Patient was observed with legs in the bed and head on the floor by staff nurse. Bed alarm was alarming.Patient was calling out at this time. Patient was assessed for injuries and assisted back in bed by 3 staff members. No obvious injuries present at this time. Patient is unable to verbalize at this time. MD notified. VSS. New orders placed and low bed ordered.  Left message for daughter to call for update  Suzan Slick, RN

## 2018-10-27 NOTE — ED Provider Notes (Signed)
[facility] Emergency Department Provider Note  ____________________________________________  Time seen: Approximately 10:32 AM  I have reviewed the triage vital signs and the nursing notes.   HISTORY  Chief Complaint Altered Mental Status    Level 5 Caveat: Portions of the History and Physical including HPI and review of systems are unable to be completely obtained due to patient being a poor historian   HPI [name] [name] is a 78 y.o. female with a history of dementia stroke diabetes and hypertension who is sent to the ED from [facility] nursing home due to altered mental status.  Patient has had declining mental status over the past 3 days or so, decreased oral intake.  Today she was essentially unresponsive.  Physician on scene, [name] evaluated patient and wanted her sent to the ED for IV fluid resuscitation.  EMS report that [name] discussed making CODE STATUS DNR, but patient's family refused.  No known fevers chills cough body aches or shortness of breath.  Patient is unable to provide any history, answer questions, or participate in exam      Past Medical History:  Diagnosis Date  . Allergic rhinitis   . CAD (coronary artery disease)   . Carotid artery occlusion 02/15/2009  . CVA (cerebral infarction)   . Dementia (HCC)   . Diabetes mellitus   . Gastritis   . GERD (gastroesophageal reflux disease)   . Heart murmur   . Hematemesis   . Hemiplegia (HCC)   . HLD (hyperlipidemia)   . Hypertension   . Hypertensive chronic kidney disease   . Macular degeneration   . Persistent mood (affective) disorder, unspecified (HCC)   . Stroke (HCC)   . Thyroid disease    hypothyroidism  . Ulcer of esophagus without bleeding      Patient Active Problem List   Diagnosis Date Noted  . Iron deficiency anemia due to chronic blood loss 09/06/2018  . Pressure injury of skin 09/04/2018  . ARF (acute renal failure) (HCC) 09/03/2018  . GI bleed  07/15/2018  . DKA (diabetic ketoacidoses) (HCC) 03/18/2018  . Upper GI bleed 10/16/2015  . Intractable nausea and vomiting 10/16/2015  . Hypertensive urgency 10/16/2015  . Alzheimer's disease (HCC) 05/30/2015  . Altered mental status 05/24/2015  . Occlusion and stenosis of carotid artery without mention of cerebral infarction 10/08/2011  . CKD (chronic kidney disease) 09/25/2011  . H/O: CVA (cardiovascular accident) 09/22/2011  . Diabetes mellitus, type 2 (HCC) 09/22/2011     Past Surgical History:  Procedure Laterality Date  . APPENDECTOMY    . ESOPHAGOGASTRODUODENOSCOPY Left 10/17/2015   Procedure: ESOPHAGOGASTRODUODENOSCOPY (EGD);  Surgeon: Scot Jun, MD;  Location: [facility] ENDOSCOPY;  Service: Endoscopy;  Laterality: Left;  . ESOPHAGOGASTRODUODENOSCOPY N/A 08/22/2018   Procedure: ESOPHAGOGASTRODUODENOSCOPY (EGD);  Surgeon: [name], MD;  Location: [facility] ENDOSCOPY;  Service: Gastroenterology;  Laterality: N/A;  . TONSILLECTOMY       Prior to Admission medications   Medication Sig Start Date End Date Taking? Authorizing Provider  aluminum hydroxide [facility]) ointment Apply topically every 12 (twelve) hours as needed. Skin protectant to buttocks    [provider]  aspirin 81 MG chewable tablet Chew 81 mg by mouth daily.    [provider]  Cholecalciferol (VITAMIN D3) 50000 units CAPS Take 1 capsule by mouth every 30 (thirty) days.     [provider]  citalopram (CELEXA) 10 MG tablet Take 5 mg by mouth daily.     [provider]  gabapentin (NEURONTIN) 300 MG capsule Take 300 mg by mouth every evening.     [provider]  guaiFENesin (ROBITUSSIN) 100 MG/5ML liquid Take 200 mg by mouth every 4 (four) hours as needed for cough.    [provider]  hydroxypropyl methylcellulose / hypromellose (ISOPTO TEARS / GONIOVISC) 2.5 % ophthalmic solution Place 1 drop into both eyes 3 (three) times daily as needed for dry eyes.     [provider]  insulin glargine (LANTUS) 100 UNIT/ML injection Inject 0.1 mLs (10 Units total) into the skin daily. 09/05/18   [name], MD  levothyroxine (SYNTHROID, LEVOTHROID) 88 MCG tablet Take 88 mcg by mouth every evening.     [provider]  lovastatin (MEVACOR) 20 MG tablet Take 20 mg by mouth every evening.     [provider]  memantine (NAMENDA) 10 MG tablet Take 10 mg by mouth 2 (two) times daily.    [provider]  Menthol (RICOLA CHERRY HONEY HERB) 2 MG LOZG Use as directed 1 lozenge in the mouth or throat every 2 (two) hours as needed (FOR COUGH).    [provider]  metoprolol succinate (TOPROL-XL) 25 MG 24 hr tablet Take 25 mg by mouth daily.    [provider]  ondansetron (ZOFRAN) 4 MG tablet Take 4 mg by mouth daily as needed for nausea or vomiting.     [provider]  pantoprazole (PROTONIX) 40 MG tablet Take 1 tablet (40 mg total) by mouth 2 (two) times daily before a meal. 08/23/18   [name], MD  polyethylene glycol (MIRALAX / GLYCOLAX) packet Take 17 g by mouth daily.    [provider]  potassium chloride (K-DUR) 10 MEQ tablet Take 10 mEq by mouth daily.     [provider]  senna-docusate (SENOKOT-S) 8.6-50 MG tablet Take 2 tablets by mouth daily.    [provider]  traZODone (DESYREL) 50 MG tablet Take 25 mg by mouth at bedtime.     [provider]     Allergies Fexofenadine hcl   Family History  Problem Relation Age of Onset  . Diabetes Mother   . Hypertension Mother   . Diabetes Father   . Hypertension Father   . Diabetes Sister   . Hypertension Sister     Social History Social History   Tobacco Use  . Smoking status: Former Smoker    Types: Cigarettes    Last attempt to quit: 07/20/1985    Years since quitting: 33.2  . Smokeless tobacco: Never Used  Substance Use Topics  . Alcohol use: No  . Drug use: No    Review of  Systems Level 5 Caveat: Portions of the History and Physical including HPI and review of systems are unable to be completely obtained due to patient being a poor historian   Constitutional:   No known fever.  ENT:   No rhinorrhea. Cardiovascular:   No chest pain or syncope. Respiratory:   No dyspnea or cough. Gastrointestinal:   Negative for abdominal pain, vomiting and diarrhea.  Musculoskeletal:   Negative for focal pain or swelling ____________________________________________   PHYSICAL EXAM:  VITAL SIGNS: ED Triage Vitals  Enc Vitals Group     BP 10/27/18 1016 98/81     Pulse Rate 10/27/18 1016 88     Resp --      Temp 10/27/18 1016 98 F (36.7 C)     Temp Source 10/27/18 1016 Oral  SpO2 10/27/18 1016 100 %     Weight 10/27/18 1018 137 lb (62.1 kg)     Height 10/27/18 1018 5' (1.524 m)     Head Circumference --      Peak Flow --      Pain Score --      Pain Loc --      Pain Edu? --      Excl. in GC? --     Vital signs reviewed, nursing assessments reviewed.   Constitutional: Awake.  Not alert, not oriented.  Ill-appearing. Eyes:   Conjunctivae are normal. EOMI. PERRL. ENT      Head:   Normocephalic and atraumatic.      Nose:   No congestion/rhinnorhea.       Mouth/Throat:   Dry mucous membranes, no pharyngeal erythema. No peritonsillar mass.       Neck:   No meningismus. Full ROM. Hematological/Lymphatic/Immunilogical:   No cervical lymphadenopathy. Cardiovascular:   RRR. Symmetric bilateral radial pulse, thready.  No murmurs. Cap refill less than 2 seconds. Respiratory:   Normal work of breathing.  Tachypnea with a respiratory rate of about 24.. Breath sounds are clear and equal bilaterally. No wheezes/rales/rhonchi. Gastrointestinal:   Soft and nontender. Non distended.   No rebound, rigidity, or guarding. Musculoskeletal:   Normal range of motion in all extremities. No joint effusions.  No lower extremity tenderness.  No edema. Neurologic:   Nonverbal, no  spontaneous movements, not interactive.  Withdraws to pain. GCS = [id number] = 9 Skin:    Skin is warm, dry and intact. No rash noted.  No petechiae, purpura, or bullae.  ____________________________________________    LABS (pertinent positives/negatives) (all labs ordered are listed, but only abnormal results are displayed) Labs Reviewed  LACTIC ACID, PLASMA - Abnormal; Notable for the following components:      Result Value   Lactic Acid, Venous 5.1 (*)    All other components within normal limits  COMPREHENSIVE METABOLIC PANEL - Abnormal; Notable for the following components:   Glucose, Bld 218 (*)    BUN 42 (*)    Creatinine, Ser 3.77 (*)    GFR calc non Af Amer 11 (*)    GFR calc Af Amer 13 (*)    Anion gap 19 (*)    All other components within normal limits  CBC WITH DIFFERENTIAL/PLATELET - Abnormal; Notable for the following components:   RBC 5.69 (*)    HCT 46.1 (*)    RDW 17.7 (*)    All other components within normal limits  APTT - Abnormal; Notable for the following components:   aPTT <24 (*)    All other components within normal limits  URINALYSIS, COMPLETE (UACMP) WITH MICROSCOPIC - Abnormal; Notable for the following components:   Color, Urine YELLOW (*)    APPearance TURBID (*)    Glucose, UA >=500 (*)    Hgb urine dipstick LARGE (*)    Protein, ur 30 (*)    Leukocytes,Ua LARGE (*)    WBC, UA >50 (*)    Bacteria, UA MANY (*)    All other components within normal limits  CULTURE, BLOOD (ROUTINE X 2)  CULTURE, BLOOD (ROUTINE X 2)  URINE CULTURE  LIPASE, BLOOD  PROTIME-INR  LACTIC ACID, PLASMA  PROCALCITONIN   ____________________________________________   EKG  Interpreted by me Sinus rhythm rate of 79, normal axis, prolonged QTC of ms.  Normal QRS ST segments and T waves.  ____________________________________________    RADIOLOGY  Dg Chest Port 1 View  Result Date: 10/27/2018 CLINICAL DATA:  Altered mental status EXAM: PORTABLE CHEST 1 VIEW  COMPARISON:  09/03/2018 FINDINGS: Heart is borderline in size. No confluent opacities in the lungs. No effusions or edema. Calcifications in the apices bilaterally, likely scarring. No acute bony abnormality. IMPRESSION: No active cardiopulmonary disease. Electronically Signed   By: Charlett NoseKevin  Dover M.D.   On: 10/27/2018 10:37    ____________________________________________   PROCEDURES .Critical Care Performed by: Sharman CheekStafford, Parisha Beaulac, MD Authorized by: Sharman CheekStafford, Fayrene Towner, MD   Critical care provider statement:    Critical care time (minutes):  35   Critical care time was exclusive of:  Separately billable procedures and treating other patients   Critical care was necessary to treat or prevent imminent or life-threatening deterioration of the following conditions:  Sepsis, CNS failure or compromise and shock   Critical care was time spent personally by me on the following activities:  Development of treatment plan with patient or surrogate, discussions with consultants, evaluation of patient's response to treatment, examination of patient, obtaining history from patient or surrogate, ordering and performing treatments and interventions, ordering and review of laboratory studies, ordering and review of radiographic studies, pulse oximetry, re-evaluation of patient's condition and review of old charts    ____________________________________________  DIFFERENTIAL DIAGNOSIS   Stroke, intracranial hemorrhage, dehydration, sepsis, urinary tract infection, pneumonia, ACS.  Doubt intra-abdominal pathology, PE, pneumothorax, dissection, AAA.  CLINICAL IMPRESSION / ASSESSMENT AND PLAN / ED COURSE  Pertinent labs & imaging results that were available during my care of the patient were reviewed by me and considered in my medical decision making (see chart for details).   Areliz A Caseres was evaluated in Emergency Department on 10/27/2018 for the symptoms described in the history of present illness. She was  evaluated in the context of the global COVID-19 pandemic, which necessitated consideration that the patient might be at risk for infection with the SARS-CoV-2 virus that causes COVID-19. Institutional protocols and algorithms that pertain to the evaluation of patients at risk for COVID-19 are in a state of rapid change based on information released by regulatory bodies including the CDC and federal and state organizations. These policies and algorithms were followed during the patient's care in the ED.      Clinical Course as of Oct 26 1125  Thu Oct 27, 2018  1031 [facility] due to altered mental status and hypotension.  Blood pressure was 70/40 on scene.  Exam is nonfocal except for tachypnea, concerning for acidosis.  Appears septic.  Code sepsis initiated, lactate blood cultures empiric antibiotics IV fluid bolus for hypotension.  Check chest x-ray CT head.   [PS]  1106 Urine sample grossly purulent.  Labs show anion gap metabolic acidosis with acute kidney injury, creatinine of 3.7 compared to recent baseline of 1.0.  Lactate of 5.1 consistent with clinically suspected shock from sepsis.  Continue fluid resuscitation and antibiotics, plan to admit.   [PS]    Clinical Course User Index [PS] Sharman CheekStafford, Maylen Waltermire, MD     ____________________________________________   FINAL CLINICAL IMPRESSION(S) / ED DIAGNOSES    Final diagnoses:  Cystitis  Septic shock (HCC)  AKI (acute kidney injury) (HCC)  Lactic acidosis     ED Discharge Orders    None      Portions of this note were generated with dragon dictation software. Dictation errors may occur despite best attempts at proofreading.   Sharman CheekStafford, Anna Beaird, MD 10/27/18 [phone]

## 2018-10-27 NOTE — ED Notes (Signed)
Date and time results received: 10/27/18 11:07 AM (use smartphrase ".now" to insert current time)  Test: lactic Critical Value: 5.1  Name of Provider Notified: Scotty Court, MD  Orders Received? Or Actions Taken?:

## 2018-10-27 NOTE — Progress Notes (Signed)
CODE SEPSIS - PHARMACY COMMUNICATION  **Broad Spectrum Antibiotics should be administered within 1 hour of Sepsis diagnosis**  Time Code Sepsis Called/Page Received: 1023  Antibiotics Ordered: Cefepime/Metronidazole/Vancomycin  Time of 1st antibiotic administration: 1059  Additional action taken by pharmacy: none  If necessary, Name of Provider/Nurse Contacted: n/a   Albina Billet, PharmD, BCPS Clinical Pharmacist 10/27/2018 11:03 AM

## 2018-10-27 NOTE — Progress Notes (Signed)
Anticoagulation monitoring(Lovenox):  78yo  female ordered Lovenox 40 mg Q24h  Filed Weights   10/27/18 1018  Weight: 137 lb (62.1 kg)   BMI 26.76   Lab Results  Component Value Date   CREATININE 3.77 (H) 10/27/2018   CREATININE 1.09 (H) 09/05/2018   CREATININE 1.55 (H) 09/04/2018   Estimated Creatinine Clearance: 10.1 mL/min (A) (by C-G formula based on SCr of 3.77 mg/dL (H)).   Hemoglobin & Hematocrit     Component Value Date/Time   HGB 14.9 10/27/2018 1023   HGB 12.3 08/18/2014 1947   HCT 46.1 (H) 10/27/2018 1023   HCT 38.1 08/18/2014 1947     Per Protocol for Patient with estCrcl < 15 ml/min and BMI < 40, will transition to heparin 5000 units SQ every 12 hours.

## 2018-10-27 NOTE — ED Notes (Signed)
Resumed care from [place], [place]. Pt returned from CT. Pt still not speaking but her eyes turned to me as I was talking to her about her daughter calling.

## 2018-10-27 NOTE — Progress Notes (Addendum)
Pt blood sugar was 48. Administered standing order protocol for hypoglycemia. Notifiy Prime. Will continue to monitor.  Update 2200: Talked to Dr. Allena Katz and just order to notify her on pt blood sugar after one hour.   Update 2245: Pt blood sugar at 159. Notify Dr. Allena Katz. Awaiting callback. Will continue to monitor.  Update 2301: Dr. Allena Katz ordered to discontinue to 0.9% normal saline and start pt on D5 0.9% normal saline at 50 ml/hr. Will continue to monitor.

## 2018-10-27 NOTE — ED Notes (Signed)
Left message on VM for Okey Regal, pt's daughter, informing her of room change.

## 2018-10-27 NOTE — ED Notes (Signed)
ED TO INPATIENT HANDOFF REPORT  ED Nurse Name and Phone #: [phone]  S Name/Age/Gender [name] [name] 79 y.o. female Room/Bed: ED15A/ED15A  Code Status   Code Status: Prior  Home/SNF/Other Nursing Home {Patient oriented: n/a; minimally responsive to pain Is this baseline? no  Triage Complete: Triage complete  Chief Complaint ems  Triage Note Pt in by EMS from [facility] for altered mental status, EMS reports declining health over the past few monthsn   Allergies Allergies  Allergen Reactions  . Fexofenadine Hcl Rash    Level of Care/Admitting Diagnosis ED Disposition    ED Disposition Condition Comment   Admit  Hospital Area: [facility] [[id number]]  Level of Care: Telemetry [5]  Diagnosis: Sepsis [facility]) [[id number]]  Admitting Physician: Ihor Austin [[id number]]  Attending Physician: Ihor Austin [[id number]]  Estimated length of stay: past midnight tomorrow  Certification:: I certify this patient will need inpatient services for at least 2 midnights  PT Class (Do Not Modify): Inpatient [101]  PT Acc Code (Do Not Modify): Private [1]       B Medical/Surgery History Past Medical History:  Diagnosis Date  . Allergic rhinitis   . CAD (coronary artery disease)   . Carotid artery occlusion 02/15/2009  . CVA (cerebral infarction)   . Dementia (HCC)   . Diabetes mellitus   . Gastritis   . GERD (gastroesophageal reflux disease)   . Heart murmur   . Hematemesis   . Hemiplegia (HCC)   . HLD (hyperlipidemia)   . Hypertension   . Hypertensive chronic kidney disease   . Macular degeneration   . Persistent mood (affective) disorder, unspecified (HCC)   . Stroke (HCC)   . Thyroid disease    hypothyroidism  . Ulcer of esophagus without bleeding    Past Surgical History:  Procedure Laterality Date  . APPENDECTOMY    . ESOPHAGOGASTRODUODENOSCOPY Left 10/17/2015   Procedure: ESOPHAGOGASTRODUODENOSCOPY (EGD);  Surgeon: Scot Jun, MD;  Location:  [facility] ENDOSCOPY;  Service: Endoscopy;  Laterality: Left;  . ESOPHAGOGASTRODUODENOSCOPY N/A 08/22/2018   Procedure: ESOPHAGOGASTRODUODENOSCOPY (EGD);  Surgeon: [name], MD;  Location: [facility] ENDOSCOPY;  Service: Gastroenterology;  Laterality: N/A;  . TONSILLECTOMY       A IV Location/Drains/Wounds Patient Lines/Drains/Airways Status   Active Line/Drains/Airways    Name:   Placement date:   Placement time:   Site:   Days:   Peripheral IV 09/08/18 Left   09/08/18    1424    -   49   Peripheral IV 10/27/18 Left Antecubital   10/27/18    1027    Antecubital   less than 1   Peripheral IV 10/27/18 Right Arm   10/27/18    1028    Arm   less than 1   External Urinary Catheter   09/03/18    1504    -   54   Pressure Injury 07/16/18 Deep Tissue Injury - Purple or maroon localized area of discolored intact skin or blood-filled blister due to damage of underlying soft tissue from pressure and/or shear. blanchable at bottom of heel   07/16/18    0459     103   Pressure Injury 09/03/18 Deep Tissue Injury - Purple or maroon localized area of discolored intact skin or blood-filled blister due to damage of underlying soft tissue from pressure and/or shear.   09/03/18    2240     54   Pressure Injury 09/03/18 Deep Tissue Injury - Purple or maroon  localized area of discolored intact skin or blood-filled blister due to damage of underlying soft tissue from pressure and/or shear.   09/03/18    2240     54          Intake/Output Last 24 hours  Intake/Output Summary (Last 24 hours) at 10/27/2018 1234 Last data filed at 10/27/2018 1228 Gross per 24 hour  Intake 2100 ml  Output -  Net 2100 ml    Labs/Imaging Results for orders placed or performed during the hospital encounter of 10/27/18 (from the past 48 hour(s))  Lactic acid, plasma     Status: Abnormal   Collection Time: 10/27/18 10:23 AM  Result Value Ref Range   Lactic Acid, Venous 5.1 (HH) 0.5 - 1.9 mmol/L    Comment: CRITICAL RESULT CALLED TO,  READ BACK BY AND VERIFIED WITH [place] Cloie Wooden AT 1104 10/27/2018 DAS Performed at [facility], [address] [place], [place] [address]   Comprehensive metabolic panel     Status: Abnormal   Collection Time: 10/27/18 10:23 AM  Result Value Ref Range   Sodium 144 135 - 145 mmol/L   Potassium 3.5 3.5 - 5.1 mmol/L   Chloride 98 98 - 111 mmol/L   CO2 27 22 - 32 mmol/L   Glucose, Bld 218 (H) 70 - 99 mg/dL   BUN 42 (H) 8 - 23 mg/dL   Creatinine, Ser 6.04 (H) 0.44 - 1.00 mg/dL   Calcium 9.8 8.9 - 54.0 mg/dL   Total Protein 7.6 6.5 - 8.1 g/dL   Albumin 3.9 3.5 - 5.0 g/dL   AST 26 15 - 41 U/L   ALT 17 0 - 44 U/L   Alkaline Phosphatase 87 38 - 126 U/L   Total Bilirubin 0.7 0.3 - 1.2 mg/dL   GFR calc non Af Amer 11 (L) >60 mL/min   GFR calc Af Amer 13 (L) >60 mL/min   Anion gap 19 (H) 5 - 15    Comment: Performed at [facility], [address] [place], [place] [address]  Lipase, blood     Status: None   Collection Time: 10/27/18 10:23 AM  Result Value Ref Range   Lipase 25 11 - 51 U/L    Comment: Performed at [facility], [address] [place], [place] [address]  CBC WITH DIFFERENTIAL     Status: Abnormal   Collection Time: 10/27/18 10:23 AM  Result Value Ref Range   WBC 9.4 4.0 - 10.5 K/uL   RBC 5.69 (H) 3.87 - 5.11 MIL/uL   Hemoglobin 14.9 12.0 - 15.0 g/dL   HCT 95.6 (H) 21.3 - 08.6 %   MCV 81.0 80.0 - 100.0 fL   MCH 26.2 26.0 - 34.0 pg   MCHC 32.3 30.0 - 36.0 g/dL   RDW 57.8 (H) 46.9 - 62.9 %   Platelets 266 150 - 400 K/uL   nRBC 0.0 0.0 - 0.2 %   Neutrophils Relative % 61 %   Neutro Abs 5.7 1.7 - 7.7 K/uL   Lymphocytes Relative 30 %   Lymphs Abs 2.9 0.7 - 4.0 K/uL   Monocytes Relative 8 %   Monocytes Absolute 0.7 0.1 - 1.0 K/uL   Eosinophils Relative 1 %   Eosinophils Absolute 0.1 0.0 - 0.5 K/uL   Basophils Relative 0 %   Basophils Absolute 0.0 0.0 - 0.1 K/uL   Immature Granulocytes 0 %   Abs Immature Granulocytes 0.03 0.00 - 0.07 K/uL     Comment: Performed at  [facility], [address] [place], [place] [address]  Procalcitonin     Status: None   Collection Time: 10/27/18 10:23 AM  Result Value Ref Range   Procalcitonin 0.74 ng/mL    Comment:        Interpretation: PCT > 0.5 ng/mL and <= 2 ng/mL: Systemic infection (sepsis) is possible, but other conditions are known to elevate PCT as well. (NOTE)       Sepsis PCT Algorithm           Lower Respiratory Tract                                      Infection PCT Algorithm    ----------------------------     ----------------------------         PCT < 0.25 ng/mL                PCT < 0.10 ng/mL         Strongly encourage             Strongly discourage   discontinuation of antibiotics    initiation of antibiotics    ----------------------------     -----------------------------       PCT 0.25 - 0.50 ng/mL            PCT 0.10 - 0.25 ng/mL               OR       >80% decrease in PCT            Discourage initiation of                                            antibiotics      Encourage discontinuation           of antibiotics    ----------------------------     -----------------------------         PCT >= 0.50 ng/mL              PCT 0.26 - 0.50 ng/mL                AND       <80% decrease in PCT             Encourage initiation of                                             antibiotics       Encourage continuation           of antibiotics    ----------------------------     -----------------------------        PCT >= 0.50 ng/mL                  PCT > 0.50 ng/mL               AND         increase in PCT                  Strongly encourage  initiation of antibiotics    Strongly encourage escalation           of antibiotics                                     -----------------------------                                           PCT <= 0.25 ng/mL                                                 OR                                         > 80% decrease in PCT                                     Discontinue / Do not initiate                                             antibiotics Performed at [facility], [address] [place], [place] [address]   APTT     Status: Abnormal   Collection Time: 10/27/18 10:23 AM  Result Value Ref Range   aPTT <24 (L) 24 - 36 seconds    Comment: RESULT REPEATED AND VERIFIED Performed at [facility], [address] [place], [place] [address]   Protime-INR     Status: None   Collection Time: 10/27/18 10:23 AM  Result Value Ref Range   Prothrombin Time 13.9 11.4 - 15.2 seconds   INR 1.1 0.8 - 1.2    Comment: (NOTE) INR goal varies based on device and disease states. Performed at [facility], [address] [place], [place] [address]   Urinalysis, Complete w Microscopic     Status: Abnormal   Collection Time: 10/27/18 10:24 AM  Result Value Ref Range   Color, Urine YELLOW (A) YELLOW   APPearance TURBID (A) CLEAR   Specific Gravity, Urine 1.014 1.005 - 1.030   pH 5.0 5.0 - 8.0   Glucose, UA >=500 (A) NEGATIVE mg/dL   Hgb urine dipstick LARGE (A) NEGATIVE   Bilirubin Urine NEGATIVE NEGATIVE   Ketones, ur NEGATIVE NEGATIVE mg/dL   Protein, ur 30 (A) NEGATIVE mg/dL   Nitrite NEGATIVE NEGATIVE   Leukocytes,Ua LARGE (A) NEGATIVE   RBC / HPF 21-50 0 - 5 RBC/hpf   WBC, UA >50 (H) 0 - 5 WBC/hpf   Bacteria, UA MANY (A) NONE SEEN   Squamous Epithelial / LPF 0-5 0 - 5   Mucus PRESENT     Comment: Performed at [facility], [address], [place], [place] [address]   Ct Head Wo Contrast  Result Date: 10/27/2018 CLINICAL DATA:  Altered level of consciousness.  Sepsis. EXAM: CT HEAD WITHOUT CONTRAST TECHNIQUE: Contiguous axial images were obtained from  the base of the skull through the vertex without intravenous contrast. COMPARISON:  CT 09/05/2015 FINDINGS: Brain: Chronic right MCA infarct unchanged. Chronic infarct cerebellum  bilaterally unchanged. Chronic microvascular ischemic changes in the white matter. Moderate atrophy. Negative for acute infarct, hemorrhage, or mass lesion. Vascular: Negative for hyperdense vessel Skull: Negative Sinuses/Orbits: Negative Other: None IMPRESSION: Atrophy and extensive chronic ischemic change as above. No acute intracranial abnormality. Electronically Signed   By: Marlan Palau M.D.   On: 10/27/2018 11:27   Dg Chest Port 1 View  Result Date: 10/27/2018 CLINICAL DATA:  Altered mental status EXAM: PORTABLE CHEST 1 VIEW COMPARISON:  09/03/2018 FINDINGS: Heart is borderline in size. No confluent opacities in the lungs. No effusions or edema. Calcifications in the apices bilaterally, likely scarring. No acute bony abnormality. IMPRESSION: No active cardiopulmonary disease. Electronically Signed   By: Charlett Nose M.D.   On: 10/27/2018 10:37    Pending Labs Unresulted Labs (From admission, onward)    Start     Ordered   10/28/18 0500  Creatinine, serum  Tomorrow morning,   STAT     10/27/18 1226   10/27/18 1400  Lactic acid, plasma  Once,   STAT     10/27/18 1139   10/27/18 1015  Lactic acid, plasma  STAT Now then every 3 hours,   STAT     10/27/18 1015   10/27/18 1015  Blood Culture (routine x 2)  BLOOD CULTURE X 2,   STAT     10/27/18 1015   10/27/18 1015  Urine culture  ONCE - STAT,   STAT     10/27/18 1015   Signed and Held  CBC  (enoxaparin (LOVENOX)    CrCl >/= 30 ml/min)  Once,   R    Comments:  Baseline for enoxaparin therapy IF NOT ALREADY DRAWN.  Notify MD if PLT < 100 K.    Signed and Held   Signed and Held  Creatinine, serum  (enoxaparin (LOVENOX)    CrCl >/= 30 ml/min)  Once,   R    Comments:  Baseline for enoxaparin therapy IF NOT ALREADY DRAWN.    Signed and Held   Signed and Held  Creatinine, serum  (enoxaparin (LOVENOX)    CrCl >/= 30 ml/min)  Weekly,   R    Comments:  while on enoxaparin therapy    Signed and Held   Signed and Held  Basic metabolic panel   Tomorrow morning,   R     Signed and Held   Signed and Held  CBC  Tomorrow morning,   R     Signed and Held          Vitals/Pain Today's Vitals   10/27/18 1016 10/27/18 1018 10/27/18 1100 10/27/18 1214  BP: 98/81  (!) 108/32 103/87  Pulse: 88  80 76  Resp:   14 18  Temp: 98 F (36.7 C)     TempSrc: Oral     SpO2: 100%  100% 100%  Weight:  62.1 kg    Height:  5' (1.524 m)      Isolation Precautions No active isolations  Medications Medications  sodium chloride 0.9 % bolus 1,000 mL (0 mLs Intravenous Stopped 10/27/18 1135)    And  sodium chloride 0.9 % bolus 1,000 mL (0 mLs Intravenous Stopped 10/27/18 1228)    And  sodium chloride 0.9 % bolus 250 mL (has no administration in time range)  metroNIDAZOLE (FLAGYL) IVPB 500 mg (500 mg Intravenous New  Bag/Given 10/27/18 1230)  vancomycin (VANCOCIN) IVPB 1000 mg/200 mL premix (1,000 mg Intravenous New Bag/Given 10/27/18 1225)  vancomycin (VANCOCIN) 500 mg in sodium chloride 0.9 % 100 mL IVPB (has no administration in time range)  ceFEPIme (MAXIPIME) 1 g in sodium chloride 0.9 % 100 mL IVPB (has no administration in time range)  ceFEPIme (MAXIPIME) 2 g in sodium chloride 0.9 % 100 mL IVPB (0 g Intravenous Stopped 10/27/18 1228)    Mobility Unknown ambulatory status High fall risk   Focused Assessments Neuro Assessment Handoff:  Swallow screen pass? No          Neuro Assessment: Exceptions to WDL Neuro Checks:      Last Documented NIHSS Modified Score:   Has TPA been given? No If patient is a Neuro Trauma and patient is going to OR before floor call report to 4N Charge nurse: [phone] or [phone]     R Recommendations: See Admitting Provider Note  Report given to:   Additional Notes:

## 2018-10-27 NOTE — ED Notes (Addendum)
Update given to [name] [name], daughter. She would like to be updated as things change.   Cell [phone] Work [phone]

## 2018-10-27 NOTE — Progress Notes (Signed)
Inpatient Diabetes Program Recommendations  AACE/ADA: New Consensus Statement on Inpatient Glycemic Control  Target Ranges:  Prepandial:   less than 140 mg/dL      Peak postprandial:   less than 180 mg/dL (1-2 hours)      Critically ill patients:  140 - 180 mg/dL   Results for [name] [name], [name] [name] (MRN [id number]) as of 10/27/2018 14:06  Ref. Range 10/27/2018 10:23  Glucose Latest Ref Range: 70 - 99 mg/dL 545 (H)   Review of Glycemic Control  Diabetes history: DM2 Outpatient Diabetes medications: Lantus 10 units daily Current orders for Inpatient glycemic control: None  Inpatient Diabetes Program Recommendations:   Correction (SSI): Please consider ordering CBGs with Novolog 0-9 units TID with meals and Novolg 0-5 units QHS.  Thanks, Orlando Penner, RN, MSN, CDE Diabetes Coordinator Inpatient Diabetes Program [phone] (Team Pager from 8am to 5pm)

## 2018-10-28 LAB — GLUCOSE, CAPILLARY
Glucose-Capillary: 111 mg/dL — ABNORMAL HIGH (ref 70–99)
Glucose-Capillary: 106 mg/dL — ABNORMAL HIGH (ref 70–99)
Glucose-Capillary: 110 mg/dL — ABNORMAL HIGH (ref 70–99)
Glucose-Capillary: 131 mg/dL — ABNORMAL HIGH (ref 70–99)
Glucose-Capillary: 115 mg/dL — ABNORMAL HIGH (ref 70–99)

## 2018-10-28 LAB — BASIC METABOLIC PANEL
Anion gap: 6 (ref 5–15)
BUN: 24 mg/dL — ABNORMAL HIGH (ref 8–23)
CO2: 25 mmol/L (ref 22–32)
Calcium: 8.1 mg/dL — ABNORMAL LOW (ref 8.9–10.3)
Chloride: 115 mmol/L — ABNORMAL HIGH (ref 98–111)
Creatinine, Ser: 1.7 mg/dL — ABNORMAL HIGH (ref 0.44–1.00)
GFR calc Af Amer: 33 mL/min — ABNORMAL LOW (ref >60)
GFR calc non Af Amer: 28 mL/min — ABNORMAL LOW (ref >60)
Glucose, Bld: 129 mg/dL — ABNORMAL HIGH (ref 70–99)
Potassium: 3.5 mmol/L (ref 3.5–5.1)
Sodium: 146 mmol/L — ABNORMAL HIGH (ref 135–145)

## 2018-10-28 LAB — MRSA PCR SCREENING: MRSA by PCR: NEGATIVE

## 2018-10-28 LAB — CBC
HCT: 42.8 % (ref 36.0–46.0)
Hemoglobin: 13.4 g/dL (ref 12.0–15.0)
MCH: 26.3 pg (ref 26.0–34.0)
MCHC: 31.3 g/dL (ref 30.0–36.0)
MCV: 83.9 fL (ref 80.0–100.0)
Platelets: 189 10*3/uL (ref 150–400)
RBC: 5.1 MIL/uL (ref 3.87–5.11)
RDW: 17.8 % — ABNORMAL HIGH (ref 11.5–15.5)
WBC: 6.1 10*3/uL (ref 4.0–10.5)
nRBC: 0 % (ref 0.0–0.2)

## 2018-10-28 LAB — VANCOMYCIN, RANDOM: Vancomycin Rm: 13

## 2018-10-28 MED ORDER — GABAPENTIN 600 MG PO TABS
300.0000 mg | ORAL_TABLET | Freq: Every day | ORAL | Status: DC
  Administered 2018-10-28 – 2018-10-30 (×3): 300 mg via ORAL
  Filled 2018-10-28 (×3): qty 1

## 2018-10-28 MED ORDER — CITALOPRAM HYDROBROMIDE 10 MG PO TABS
5.0000 mg | ORAL_TABLET | Freq: Every day | ORAL | Status: DC
  Administered 2018-10-30 – 2018-10-31 (×2): 5 mg via ORAL
  Filled 2018-10-28 (×3): qty 1

## 2018-10-28 MED ORDER — SODIUM CHLORIDE 0.9 % IV SOLN
1.0000 g | Freq: Two times a day (BID) | INTRAVENOUS | Status: DC
  Administered 2018-10-28: 1 g via INTRAVENOUS
  Filled 2018-10-28 (×3): qty 1

## 2018-10-28 MED ORDER — MIRTAZAPINE 15 MG PO TABS
15.0000 mg | ORAL_TABLET | Freq: Every day | ORAL | Status: DC
  Administered 2018-10-28 – 2018-10-30 (×3): 15 mg via ORAL
  Filled 2018-10-28 (×3): qty 1

## 2018-10-28 MED ORDER — VANCOMYCIN HCL 500 MG IV SOLR
500.0000 mg | INTRAVENOUS | Status: DC
  Administered 2018-10-28: 500 mg via INTRAVENOUS
  Filled 2018-10-28 (×2): qty 500

## 2018-10-28 NOTE — Progress Notes (Signed)
[facility] [place] [facility]   PATIENT NAME: Ivori Lindsey    MR#:  [id number]  DATE OF BIRTH:  May 01, 1941  SUBJECTIVE:  CHIEF COMPLAINT:   Chief Complaint  Patient presents with  . Altered Mental Status  hard of hearing AMS REVIEW OF SYSTEMS:  ROSun able to evaluate due to current mental status DRUG ALLERGIES:   Allergies  Allergen Reactions  . Fexofenadine Hcl Rash   VITALS:  Blood pressure (!) 119/43, pulse 81, temperature 98.6 F (37 C), temperature source Oral, resp. rate 18, height 5' (1.524 m), weight 62.1 kg, SpO2 99 %. PHYSICAL EXAMINATION:  Physical Exam HENT:     Head: Normocephalic and atraumatic.  Eyes:     Conjunctiva/sclera: Conjunctivae normal.     Pupils: Pupils are equal, round, and reactive to light.  Neck:     Musculoskeletal: Normal range of motion and neck supple.     Thyroid: No thyromegaly.     Trachea: No tracheal deviation.  Cardiovascular:     Rate and Rhythm: Normal rate and regular rhythm.     Heart sounds: Normal heart sounds.  Pulmonary:     Effort: Pulmonary effort is normal. No respiratory distress.     Breath sounds: Normal breath sounds. No wheezing.  Chest:     Chest wall: No tenderness.  Abdominal:     General: Bowel sounds are normal. There is no distension.     Palpations: Abdomen is soft.     Tenderness: There is no abdominal tenderness.  Musculoskeletal: Normal range of motion.  Skin:    General: Skin is warm and dry.     Findings: No rash.  Neurological:     Mental Status: She is alert. She is confused.     Cranial Nerves: No cranial nerve deficit.  Psychiatric:     Comments: Unable to evaluate due to her mental status    LABORATORY PANEL:  Female CBC Recent Labs  Lab 10/28/18 1159  WBC 6.1  HGB 13.4  HCT 42.8  PLT 189   ------------------------------------------------------------------------------------------------------------------ Chemistries  Recent Labs  Lab 10/27/18 1023  10/28/18 1159  NA 144 146*  K 3.5 3.5  CL 98 115*  CO2 27 25  GLUCOSE 218* 129*  BUN 42* 24*  CREATININE 3.77* 1.70*  CALCIUM 9.8 8.1*  AST 26  --   ALT 17  --   ALKPHOS 87  --   BILITOT 0.7  --    RADIOLOGY:  Ct Head Wo Contrast  Result Date: 10/27/2018 CLINICAL DATA:  Fall with headache EXAM: CT HEAD WITHOUT CONTRAST TECHNIQUE: Contiguous axial images were obtained from the base of the skull through the vertex without intravenous contrast. COMPARISON:  10/27/2018 FINDINGS: Brain: There is no mass, hemorrhage or extra-axial collection. There is generalized atrophy without lobar predilection. Hypodensity of the white matter is most commonly associated with chronic microvascular disease. Old right MCA territory infarct. Multiple old cerebellar infarcts. Vascular: No abnormal hyperdensity of the major intracranial arteries or dural venous sinuses. No intracranial atherosclerosis. Skull: The visualized skull base, calvarium and extracranial soft tissues are normal. Sinuses/Orbits: No fluid levels or advanced mucosal thickening of the visualized paranasal sinuses. No mastoid or middle ear effusion. The orbits are normal. IMPRESSION: 1. No acute intracranial abnormality. 2. Generalized atrophy and findings of chronic microvascular ischemia. 3. Old right MCA and bilateral cerebellar infarcts. Electronically Signed   By: Deatra Robinson M.D.   On: 10/27/2018 19:51   ASSESSMENT AND PLAN:  78 year old  female patient with a known history of coronary artery disease, CVA, dementia, type 2 diabetes mellitus, GERD  was referred for low blood pressure and decreased responsiveness.   -Severe sepsis Present on admission - continue IV antibiotics with vancomycin and cefepime - pending cultures  Lactic Level trending down  -Hypotension secondary to sepsis Responding to IV fluids Continue IV fluids based on sepsis protocol  -Urinary tract infection: based on UA Follow-up cultures Currently on  broad-spectrum antibiotic We will narrow antibiotics once sensitivities are out  - Hypernatremia: start D5 NS  -Acute on chronic kidney disease stage II Avoid nephrotoxic medication IV fluid hydration Monitor renal function   Will start diet   All the records are reviewed and case discussed with Care Management/Social Worker. Management plans discussed with the patient, Nursing and they are in agreement.  CODE STATUS: Full Code  TOTAL TIME TAKING CARE OF THIS PATIENT: 35 minutes.   More than 50% of the time was spent in counseling/coordination of care: YES  POSSIBLE D/C IN 1-2 DAYS, DEPENDING ON CLINICAL CONDITION.   Kahliyah Dick M.D on 10/28/2018 at 5:51 PM  Between 7am to 6pm - PDelfino Lovettager - [phone]  After 6pm go to [email or web address] - Social research officer, governmentpassword EPAS [facility]  [facility] [place] [facility]  Office  [phone]  CC: Primary care physician; Karie SchwalbeLetvak, Richard I, MD  Note: This dictation was prepared with Dragon dictation along with smaller phrase technology. Any transcriptional errors that result from this process are unintentional.

## 2018-10-28 NOTE — Progress Notes (Signed)
Talked to Dr. Sherryll Burger, patient more alert now, asked if we can start diet for her, soft diet started. RN will continue to monitor.

## 2018-10-28 NOTE — Plan of Care (Signed)
  Problem: Safety: Goal: Ability to remain free from injury will improve Outcome: Progressing   

## 2018-10-28 NOTE — Progress Notes (Addendum)
Inpatient Diabetes Program Recommendations  AACE/ADA: New Consensus Statement on Inpatient Glycemic Control   Target Ranges:  Prepandial:   less than 140 mg/dL      Peak postprandial:   less than 180 mg/dL (1-2 hours)      Critically ill patients:  140 - 180 mg/dL   Results for BRYNLEY, BADGLEY (MRN [id number]) as of 10/28/2018 07:42  Ref. Range 09/05/2018 08:13 10/27/2018 17:03 10/27/2018 21:30 10/27/2018 22:45 10/28/2018 02:38 10/28/2018 07:33  Glucose-Capillary Latest Ref Range: 70 - 99 mg/dL 85 381 (H) 48 (L) 829 (H) 111 (H) 106 (H)   Review of Glycemic Control  Diabetes history:DM2 Outpatient Diabetes medications: Lantus 14 units QHS Current orders for Inpatient glycemic control: Lantus 10 units daily, Novolog 0-9 units TID with meals, Novolog 0-5 units QHS  Inpatient Diabetes Program Recommendations:   Insulin-Basal: No Lantus has been given since arrived at the hospital and glucose down to 48 mg/dl on 03/22/70. Please discontinue Lantus while inpatient.  Thanks, Orlando Penner, RN, MSN, CDE Diabetes Coordinator Inpatient Diabetes Program [phone] (Team Pager from 8am to 5pm)

## 2018-10-28 NOTE — Consult Note (Signed)
Pharmacy Antibiotic Note  [name] [name] is a 78 y.o. female admitted on 10/27/2018 with sepsis.  Pharmacy has been consulted for Vancomycin/Cefepime dosing. Patient was in AKI on admission. Her baseline Scr is ~ 1.1. Presently, her renal function has improved and will need renal adjustment to antibiotics.   Plan: Change Vancomycin to 500 mg IV Q 24 hrs. Goal AUC 400-550. Expected Cmin: 17.6 Expected AUC: 541.3 SCr used: 1.70  Change Cefepime to 1g q 12 hrs   Height: 5' (152.4 cm) Weight: 137 lb (62.1 kg) IBW/kg (Calculated) : 45.5- used this weight in my calculation  Temp (24hrs), Avg:97.9 F (36.6 C), Min:97.5 F (36.4 C), Max:98.2 F (36.8 C)  Recent Labs  Lab 10/27/18 1023 10/27/18 1331 10/27/18 1619 10/28/18 1159  WBC 9.4  --   --  6.1  CREATININE 3.77*  --   --  1.70*  LATICACIDVEN 5.1* 3.7* 2.7*  --   VANCORANDOM  --   --   --  13    Estimated Creatinine Clearance: 22.4 mL/min (A) (by C-G formula based on SCr of 1.7 mg/dL (H)).    Allergies  Allergen Reactions  . Fexofenadine Hcl Rash    Antimicrobials this admission: Cefepime 4/9 >> Vancomycin 4/9 >> Metronidazole 4/9 x 1  Dose adjustments this admission: None  Microbiology results: 4/9 BCx: NG < 24 hours 4/9 UCx: E.coli   Thank you for allowing pharmacy to be a part of this patient's care.  Katha Cabal, PharmD Clinical Pharmacist 10/28/2018 3:17 PM

## 2018-10-29 LAB — BASIC METABOLIC PANEL
Anion gap: 8 (ref 5–15)
BUN: 14 mg/dL (ref 8–23)
CO2: 22 mmol/L (ref 22–32)
Calcium: 7.6 mg/dL — ABNORMAL LOW (ref 8.9–10.3)
Chloride: 114 mmol/L — ABNORMAL HIGH (ref 98–111)
Creatinine, Ser: 1.22 mg/dL — ABNORMAL HIGH (ref 0.44–1.00)
GFR calc Af Amer: 49 mL/min — ABNORMAL LOW (ref >60)
GFR calc non Af Amer: 42 mL/min — ABNORMAL LOW (ref >60)
Glucose, Bld: 121 mg/dL — ABNORMAL HIGH (ref 70–99)
Potassium: 2.6 mmol/L — CL (ref 3.5–5.1)
Sodium: 144 mmol/L (ref 135–145)

## 2018-10-29 LAB — GLUCOSE, CAPILLARY
Glucose-Capillary: 100 mg/dL — ABNORMAL HIGH (ref 70–99)
Glucose-Capillary: 113 mg/dL — ABNORMAL HIGH (ref 70–99)
Glucose-Capillary: 115 mg/dL — ABNORMAL HIGH (ref 70–99)
Glucose-Capillary: 131 mg/dL — ABNORMAL HIGH (ref 70–99)
Glucose-Capillary: 140 mg/dL — ABNORMAL HIGH (ref 70–99)
Glucose-Capillary: 164 mg/dL — ABNORMAL HIGH (ref 70–99)

## 2018-10-29 LAB — CBC
HCT: 37.5 % (ref 36.0–46.0)
Hemoglobin: 11.9 g/dL — ABNORMAL LOW (ref 12.0–15.0)
MCH: 26.2 pg (ref 26.0–34.0)
MCHC: 31.7 g/dL (ref 30.0–36.0)
MCV: 82.6 fL (ref 80.0–100.0)
Platelets: 145 10*3/uL — ABNORMAL LOW (ref 150–400)
RBC: 4.54 MIL/uL (ref 3.87–5.11)
RDW: 17.3 % — ABNORMAL HIGH (ref 11.5–15.5)
WBC: 5.1 10*3/uL (ref 4.0–10.5)
nRBC: 0 % (ref 0.0–0.2)

## 2018-10-29 LAB — POTASSIUM: Potassium: 3.3 mmol/L — ABNORMAL LOW (ref 3.5–5.1)

## 2018-10-29 LAB — MAGNESIUM
Magnesium: 1.4 mg/dL — ABNORMAL LOW (ref 1.7–2.4)
Magnesium: 1.9 mg/dL (ref 1.7–2.4)

## 2018-10-29 MED ORDER — SODIUM CHLORIDE 0.9% FLUSH
3.0000 mL | Freq: Two times a day (BID) | INTRAVENOUS | Status: DC
  Administered 2018-10-29 – 2018-10-31 (×3): 3 mL via INTRAVENOUS

## 2018-10-29 MED ORDER — SODIUM CHLORIDE 0.9% FLUSH
3.0000 mL | Freq: Two times a day (BID) | INTRAVENOUS | Status: DC
  Administered 2018-10-29 – 2018-10-31 (×4): 3 mL via INTRAVENOUS

## 2018-10-29 MED ORDER — SODIUM CHLORIDE 0.9% FLUSH: 3.0000 mL | INTRAVENOUS | Status: DC | PRN

## 2018-10-29 MED ORDER — POTASSIUM CHLORIDE CRYS ER 20 MEQ PO TBCR
40.0000 meq | EXTENDED_RELEASE_TABLET | ORAL | Status: DC
  Filled 2018-10-29: qty 2

## 2018-10-29 MED ORDER — SODIUM CHLORIDE 0.9 % IV SOLN
INTRAVENOUS | Status: DC | PRN
  Administered 2018-10-29 (×2): 500 mL via INTRAVENOUS

## 2018-10-29 MED ORDER — POTASSIUM CHLORIDE 10 MEQ/100ML IV SOLN
10.0000 meq | INTRAVENOUS | Status: AC
  Administered 2018-10-29 (×4): 10 meq via INTRAVENOUS
  Filled 2018-10-29 (×4): qty 100

## 2018-10-29 MED ORDER — MAGNESIUM SULFATE 2 GM/50ML IV SOLN
2.0000 g | Freq: Once | INTRAVENOUS | Status: AC
  Administered 2018-10-29: 2 g via INTRAVENOUS
  Filled 2018-10-29: qty 50

## 2018-10-29 MED ORDER — POTASSIUM CHLORIDE 10 MEQ/100ML IV SOLN
10.0000 meq | INTRAVENOUS | Status: AC
  Administered 2018-10-29 – 2018-10-30 (×2): 10 meq via INTRAVENOUS
  Filled 2018-10-29 (×2): qty 100

## 2018-10-29 MED ORDER — DEXTROSE 5 % IV SOLN
500.0000 mg | Freq: Two times a day (BID) | INTRAVENOUS | Status: DC
  Administered 2018-10-29 (×2): 500 mg via INTRAVENOUS
  Filled 2018-10-29 (×3): qty 5

## 2018-10-29 NOTE — Progress Notes (Signed)
Family Meeting Note  Advance Directive:yes  Today a meeting took place with the Daughter over phone.  Patient is unable to participate due ZO:XWRUEA capacity Dementia   The following clinical team members were present during this meeting:MD  The following were discussed:Patient's diagnosis: 78 year old female patientwith a known history ofcoronary artery disease, CVA, dementia, type 2 diabetesmellitus, GERD admitted for low blood pressure and decreased responsiveness.   Past Medical History:  Diagnosis Date  . Allergic rhinitis   . CAD (coronary artery disease)   . Carotid artery occlusion 02/15/2009  . CVA (cerebral infarction)   . Dementia (HCC)   . Diabetes mellitus   . Gastritis   . GERD (gastroesophageal reflux disease)   . Heart murmur   . Hematemesis   . Hemiplegia (HCC)   . HLD (hyperlipidemia)   . Hypertension   . Hypertensive chronic kidney disease   . Macular degeneration   . Persistent mood (affective) disorder, unspecified (HCC)   . Stroke (HCC)   . Thyroid disease    hypothyroidism  . Ulcer of esophagus without bleeding     Patient's progosis: > 12 months and Goals for treatment: Full Code  Additional follow-up to be provided: Outpt Palliative care  Time spent during discussion:20 minutes  Delfino Lovett, MD

## 2018-10-29 NOTE — Progress Notes (Signed)
Patient's daughter talked with her this afternoon and encouraged her to take her medicines.  Patient still refuses to open her mouth.

## 2018-10-29 NOTE — Progress Notes (Signed)
[employer] - [place] at [facility]   PATIENT NAME: Nhyla Wilmouth    MR#:  [id number]  DATE OF BIRTH:  11-19-1940  SUBJECTIVE:  CHIEF COMPLAINT:   Chief Complaint  Patient presents with  . Altered Mental Status  hard of hearing, pleasantly confused.  Patient refusing to take anything by mouth.  Potassium and magnesium remains very low. REVIEW OF SYSTEMS:  ROS unable to evaluate due to current mental status DRUG ALLERGIES:   Allergies  Allergen Reactions  . Fexofenadine Hcl Rash   VITALS:  Blood pressure (!) 145/54, pulse 79, temperature 97.9 F (36.6 C), temperature source Oral, resp. rate 20, height 5' (1.524 m), weight 62.1 kg, SpO2 100 %. PHYSICAL EXAMINATION:  Physical Exam HENT:     Head: Normocephalic and atraumatic.  Eyes:     Conjunctiva/sclera: Conjunctivae normal.     Pupils: Pupils are equal, round, and reactive to light.  Neck:     Musculoskeletal: Normal range of motion and neck supple.     Thyroid: No thyromegaly.     Trachea: No tracheal deviation.  Cardiovascular:     Rate and Rhythm: Normal rate and regular rhythm.     Heart sounds: Normal heart sounds.  Pulmonary:     Effort: Pulmonary effort is normal. No respiratory distress.     Breath sounds: Normal breath sounds. No wheezing.  Chest:     Chest wall: No tenderness.  Abdominal:     General: Bowel sounds are normal. There is no distension.     Palpations: Abdomen is soft.     Tenderness: There is no abdominal tenderness.  Musculoskeletal: Normal range of motion.  Skin:    General: Skin is warm and dry.     Findings: No rash.  Neurological:     Mental Status: She is alert. She is confused.     Cranial Nerves: No cranial nerve deficit.  Psychiatric:     Comments: Unable to evaluate due to her mental status    LABORATORY PANEL:  Female CBC Recent Labs  Lab 10/29/18 0533  WBC 5.1  HGB 11.9*  HCT 37.5  PLT 145*    ------------------------------------------------------------------------------------------------------------------ Chemistries  Recent Labs  Lab 10/27/18 1023  10/29/18 0533 10/29/18 0718  NA 144   < > 144  --   K 3.5   < > 2.6*  --   CL 98   < > 114*  --   CO2 27   < > 22  --   GLUCOSE 218*   < > 121*  --   BUN 42*   < > 14  --   CREATININE 3.77*   < > 1.22*  --   CALCIUM 9.8   < > 7.6*  --   MG  --   --   --  1.4*  AST 26  --   --   --   ALT 17  --   --   --   ALKPHOS 87  --   --   --   BILITOT 0.7  --   --   --    < > = values in this interval not displayed.   RADIOLOGY:  No results found. ASSESSMENT AND PLAN:  78 year old female patient with a known history of coronary artery disease, CVA, dementia, type 2 diabetes mellitus, GERD  was referred for low blood pressure and decreased responsiveness.   -Severe sepsis: Present on admission due to UTI Lactic Level trending down  *Hypokalemia/hypomagnesemia -Replete  and recheck -Per Nursing patient is refusing po meds and food.    Will replete intravenously  -Hypotension secondary to sepsis -Resolved  -ESBL E. coli urinary tract infection: Urine culture growing ESBL E. coli -Change antibiotic to IV Ancef based on culture sensitivity  - Hypernatremia: Resolved  -Acute on chronic kidney disease stage II Avoid nephrotoxic medication IV fluid hydration Monitor renal function     All the records are reviewed and case discussed with Care Management/Social Worker. Management plans discussed with the patient, Nursing, discussed with daughter over phone and they are in agreement.  CODE STATUS: Full Code  TOTAL TIME TAKING CARE OF THIS PATIENT: 35 minutes.   More than 50% of the time was spent in counseling/coordination of care: YES  POSSIBLE D/C IN 1 DAYS, DEPENDING ON CLINICAL CONDITION.   Delfino LovettVipul Alila Sotero M.D on 10/29/2018 at 1:20 PM  Between 7am to 6pm - Pager - [phone]  After 6pm go to [email or web address] -  Social research officer, governmentpassword EPAS [facility]  [facility] [place] [facility]  Office  [phone]  CC: Primary care physician; Karie SchwalbeLetvak, Richard I, MD  Note: This dictation was prepared with Dragon dictation along with smaller phrase technology. Any transcriptional errors that result from this process are unintentional.

## 2018-10-29 NOTE — Plan of Care (Signed)
Pt is more responsive to commands this shift. She answers questions appropriately when given choices of ice cream flavors, etc. Diet advanced to soft this shift. Patient has poor appetite, but will accept small bites fed to her. UOP adequate. IVF continued. Repositioned q2h. Low bed and floor mats maintained. Free from falls this shift.

## 2018-10-29 NOTE — Progress Notes (Signed)
Pharmacy Electrolyte Monitoring Consult:  Pharmacy consulted to assist in monitoring and replacing electrolytes in this 78 y.o. female admitted on 10/27/2018 with Altered Mental Status   Labs:  Sodium (mmol/L)  Date Value  10/29/2018 144  08/18/2014 141   Potassium (mmol/L)  Date Value  10/29/2018 3.3 (L)  08/18/2014 3.8   Magnesium (mg/dL)  Date Value  95/28/4132 1.9   Phosphorus (mg/dL)  Date Value  44/07/270 4.0   Calcium (mg/dL)  Date Value  53/66/4403 7.6 (L)   Calcium, Total (mg/dL)  Date Value  47/42/5956 8.5   Albumin (g/dL)  Date Value  38/75/6433 3.9  11/15/2013 3.5    Assessment/Plan: 4/11 K 3.3  Mg 1.9 Scr 1.22  Patient is not taking PO - will order KCL 10 meq IV x2. Will recheck levels with am labs    Bettey Costa 10/29/2018 9:38 PM

## 2018-10-29 NOTE — Progress Notes (Signed)
Pharmacy Electrolyte Monitoring Consult:  Pharmacy consulted to assist in monitoring and replacing electrolytes in this 78 y.o. female admitted on 10/27/2018 with Altered Mental Status   Labs:  Sodium (mmol/L)  Date Value  10/29/2018 144  08/18/2014 141   Potassium (mmol/L)  Date Value  10/29/2018 2.6 (LL)  08/18/2014 3.8   Magnesium (mg/dL)  Date Value  24/26/8341 1.4 (L)   Phosphorus (mg/dL)  Date Value  96/22/2979 4.0   Calcium (mg/dL)  Date Value  89/21/1941 7.6 (L)   Calcium, Total (mg/dL)  Date Value  74/02/1447 8.5   Albumin (g/dL)  Date Value  18/56/3149 3.9  11/15/2013 3.5    Assessment/Plan: 4/11 K 2.6   Mag 1.4  Scr 1.22  MD has ordered Magnesium 2 gram IV x 1 and KCL 40 meq PO q2h x 2. Patient is not taking PO - will change to KCL 10 meq IV x4. Will recheck K and Mag at 1600 Will also f/u with am labs    Tanishka Drolet A 10/29/2018 9:12 AM

## 2018-10-29 NOTE — Progress Notes (Signed)
Patient is refusing po meds and food.  She won't open her mouth and pulls her head back or to the side to avoid the spoon.  Dr. Sherryll Burger notified.  Potassium supplement changed to IVPB.

## 2018-10-29 NOTE — Plan of Care (Signed)
Despite encouragement from her daughter she is still eating very little.

## 2018-10-30 LAB — CBC
HCT: 36 % (ref 36.0–46.0)
Hemoglobin: 11.6 g/dL — ABNORMAL LOW (ref 12.0–15.0)
MCH: 26.5 pg (ref 26.0–34.0)
MCHC: 32.2 g/dL (ref 30.0–36.0)
MCV: 82.2 fL (ref 80.0–100.0)
Platelets: 149 10*3/uL — ABNORMAL LOW (ref 150–400)
RBC: 4.38 MIL/uL (ref 3.87–5.11)
RDW: 17.1 % — ABNORMAL HIGH (ref 11.5–15.5)
WBC: 5.5 10*3/uL (ref 4.0–10.5)
nRBC: 0 % (ref 0.0–0.2)

## 2018-10-30 LAB — BASIC METABOLIC PANEL
Anion gap: 9 (ref 5–15)
BUN: 7 mg/dL — ABNORMAL LOW (ref 8–23)
CO2: 21 mmol/L — ABNORMAL LOW (ref 22–32)
Calcium: 7.6 mg/dL — ABNORMAL LOW (ref 8.9–10.3)
Chloride: 110 mmol/L (ref 98–111)
Creatinine, Ser: 0.97 mg/dL (ref 0.44–1.00)
GFR calc Af Amer: >60 mL/min (ref >60)
GFR calc non Af Amer: 56 mL/min — ABNORMAL LOW (ref >60)
Glucose, Bld: 258 mg/dL — ABNORMAL HIGH (ref 70–99)
Potassium: 3.2 mmol/L — ABNORMAL LOW (ref 3.5–5.1)
Sodium: 140 mmol/L (ref 135–145)

## 2018-10-30 LAB — FOLATE: Folate: 5.5 ng/mL — ABNORMAL LOW (ref >5.9)

## 2018-10-30 LAB — GLUCOSE, CAPILLARY
Glucose-Capillary: 236 mg/dL — ABNORMAL HIGH (ref 70–99)
Glucose-Capillary: 233 mg/dL — ABNORMAL HIGH (ref 70–99)
Glucose-Capillary: 149 mg/dL — ABNORMAL HIGH (ref 70–99)
Glucose-Capillary: 149 mg/dL — ABNORMAL HIGH (ref 70–99)

## 2018-10-30 LAB — URINE CULTURE: Culture: >=100000 — AB

## 2018-10-30 LAB — RETICULOCYTES
Immature Retic Fract: 18.2 % — ABNORMAL HIGH (ref 2.3–15.9)
RBC.: 4.89 MIL/uL (ref 3.87–5.11)
Retic Count, Absolute: 90 10*3/uL (ref 19.0–186.0)
Retic Ct Pct: 1.8 % (ref 0.4–3.1)

## 2018-10-30 LAB — IRON AND TIBC
Iron: 79 ug/dL (ref 28–170)
Saturation Ratios: 42 % — ABNORMAL HIGH (ref 10.4–31.8)
TIBC: 187 ug/dL — ABNORMAL LOW (ref 250–450)
UIBC: 108 ug/dL

## 2018-10-30 LAB — POTASSIUM: Potassium: 4.1 mmol/L (ref 3.5–5.1)

## 2018-10-30 LAB — FERRITIN: Ferritin: 422 ng/mL — ABNORMAL HIGH (ref 11–307)

## 2018-10-30 MED ORDER — DOCUSATE SODIUM 100 MG PO CAPS
200.0000 mg | ORAL_CAPSULE | Freq: Two times a day (BID) | ORAL | Status: DC
  Administered 2018-10-30 – 2018-10-31 (×3): 200 mg via ORAL
  Filled 2018-10-30 (×3): qty 2

## 2018-10-30 MED ORDER — POTASSIUM CHLORIDE 10 MEQ/100ML IV SOLN
10.0000 meq | INTRAVENOUS | Status: AC
  Administered 2018-10-30 (×4): 10 meq via INTRAVENOUS
  Filled 2018-10-30 (×4): qty 100

## 2018-10-30 MED ORDER — CEFAZOLIN SODIUM-DEXTROSE 1-4 GM/50ML-% IV SOLN
1.0000 g | Freq: Three times a day (TID) | INTRAVENOUS | Status: DC
  Administered 2018-10-30 – 2018-10-31 (×5): 1 g via INTRAVENOUS
  Filled 2018-10-30 (×8): qty 50

## 2018-10-30 NOTE — Progress Notes (Signed)
She refused to take her pills until her daughter spoke with her on the phone and got her to agree to take them.

## 2018-10-30 NOTE — Plan of Care (Signed)
  Problem: Clinical Measurements: Goal: Ability to maintain clinical measurements within normal limits will improve Outcome: Progressing   Problem: Safety: Goal: Ability to remain free from injury will improve Outcome: Progressing   Problem: Clinical Measurements: Goal: Diagnostic test results will improve Outcome: Progressing Goal: Signs and symptoms of infection will decrease Outcome: Progressing   Problem: Respiratory: Goal: Ability to maintain adequate ventilation will improve Outcome: Progressing   

## 2018-10-30 NOTE — Progress Notes (Signed)
[employer] - [place] at [facility]   PATIENT NAME: [name] RibasSophie [name]    MR#:  [id number]  DATE OF BIRTH:  14-Jun-1941  SUBJECTIVE:  CHIEF COMPLAINT:   Chief Complaint  Patient presents with  . Altered Mental Status  hard of hearing, pleasantly confused.  More alert today, eating some, K low REVIEW OF SYSTEMS:  ROS unable to evaluate due to current mental status DRUG ALLERGIES:   Allergies  Allergen Reactions  . Fexofenadine Hcl Rash   VITALS:  Blood pressure (!) 155/61, pulse 89, temperature 98.8 F (37.1 C), temperature source Oral, resp. rate 14, height 5' (1.524 m), weight 62.1 kg, SpO2 100 %. PHYSICAL EXAMINATION:  Physical Exam HENT:     Head: Normocephalic and atraumatic.  Eyes:     Conjunctiva/sclera: Conjunctivae normal.     Pupils: Pupils are equal, round, and reactive to light.  Neck:     Musculoskeletal: Normal range of motion and neck supple.     Thyroid: No thyromegaly.     Trachea: No tracheal deviation.  Cardiovascular:     Rate and Rhythm: Normal rate and regular rhythm.     Heart sounds: Normal heart sounds.  Pulmonary:     Effort: Pulmonary effort is normal. No respiratory distress.     Breath sounds: Normal breath sounds. No wheezing.  Chest:     Chest wall: No tenderness.  Abdominal:     General: Bowel sounds are normal. There is no distension.     Palpations: Abdomen is soft.     Tenderness: There is no abdominal tenderness.  Musculoskeletal: Normal range of motion.  Skin:    General: Skin is warm and dry.     Findings: No rash.  Neurological:     Mental Status: She is alert. She is confused.     Cranial Nerves: No cranial nerve deficit.  Psychiatric:     Comments: Unable to evaluate due to her mental status    LABORATORY PANEL:  Female CBC Recent Labs  Lab 10/30/18 0358  WBC 5.5  HGB 11.6*  HCT 36.0  PLT 149*    ------------------------------------------------------------------------------------------------------------------ Chemistries  Recent Labs  Lab 10/27/18 1023  10/29/18 2018 10/30/18 0358  NA 144   < >  --  140  K 3.5   < > 3.3* 3.2*  CL 98   < >  --  110  CO2 27   < >  --  21*  GLUCOSE 218*   < >  --  258*  BUN 42*   < >  --  7*  CREATININE 3.77*   < >  --  0.97  CALCIUM 9.8   < >  --  7.6*  MG  --    < > 1.9  --   AST 26  --   --   --   ALT 17  --   --   --   ALKPHOS 87  --   --   --   BILITOT 0.7  --   --   --    < > = values in this interval not displayed.   RADIOLOGY:  No results found. ASSESSMENT AND PLAN:  78 year old female patient with a known history of coronary artery disease, CVA, dementia, type 2 diabetes mellitus, GERD  was referred for low blood pressure and decreased responsiveness.   -Severe sepsis: Present on admission due to UTI Lactic Level trending down  *Hypokalemia/hypomagnesemia -Replete and recheck  -Hypotension secondary to sepsis -Resolved  -  ESBL E. coli urinary tract infection: Urine culture growing ESBL E. coli -Continue to IV Ancef based on culture sensitivity  - Hypernatremia: Resolved  -Acute on chronic kidney disease stage II Avoid nephrotoxic medication IV fluid hydration Monitor renal function  * Constipation: start colace  * iron defi anemia: gets iron infusion at [facility] center. Will check anemia panel     All the records are reviewed and case discussed with Care Management/Social Worker. Management plans discussed with the patient, Nursing, discussed with daughter over phone and they are in agreement.  CODE STATUS: Full Code  TOTAL TIME TAKING CARE OF THIS PATIENT: 35 minutes.   More than 50% of the time was spent in counseling/coordination of care: YES  POSSIBLE D/C IN 1 DAYS, DEPENDING ON CLINICAL CONDITION.   Delfino Lovett M.D on 10/30/2018 at 6:06 PM  Between 7am to 6pm - Pager - [phone]  After 6pm go  to [email or web address] - Social research officer, government  [facility] [place] [facility]  Office  [phone]  CC: Primary care physician; Karie Schwalbe, MD  Note: This dictation was prepared with Dragon dictation along with smaller phrase technology. Any transcriptional errors that result from this process are unintentional.

## 2018-10-30 NOTE — Progress Notes (Signed)
Pharmacy Electrolyte Monitoring Consult:  Pharmacy consulted to assist in monitoring and replacing electrolytes in this 78 y.o. female admitted on 10/27/2018 with Altered Mental Status   Labs:  Sodium (mmol/L)  Date Value  10/30/2018 140  08/18/2014 141   Potassium (mmol/L)  Date Value  10/30/2018 4.1  08/18/2014 3.8   Magnesium (mg/dL)  Date Value  44/96/7591 1.9   Phosphorus (mg/dL)  Date Value  63/84/6659 4.0   Calcium (mg/dL)  Date Value  93/57/0177 7.6 (L)   Calcium, Total (mg/dL)  Date Value  93/90/3009 8.5   Albumin (g/dL)  Date Value  23/30/0762 3.9  11/15/2013 3.5    Assessment/Plan: 4/12  K 4.1   Scr 0.97   Patient is not taking PO -  Patient's K level is WNL. No supplementation is advised currently. Will f/u electrolytes with am labs   Bettey Costa, PharmD Clinical Pharmacist 10/30/2018 7:52 PM

## 2018-10-30 NOTE — Progress Notes (Signed)
Pharmacy Electrolyte Monitoring Consult:  Pharmacy consulted to assist in monitoring and replacing electrolytes in this 78 y.o. female admitted on 10/27/2018 with Altered Mental Status   Labs:  Sodium (mmol/L)  Date Value  10/30/2018 140  08/18/2014 141   Potassium (mmol/L)  Date Value  10/30/2018 3.2 (L)  08/18/2014 3.8   Magnesium (mg/dL)  Date Value  16/57/9038 1.9   Phosphorus (mg/dL)  Date Value  33/38/3291 4.0   Calcium (mg/dL)  Date Value  91/66/0600 7.6 (L)   Calcium, Total (mg/dL)  Date Value  45/99/7741 8.5   Albumin (g/dL)  Date Value  42/39/5320 3.9  11/15/2013 3.5    Assessment/Plan: 4/12  K 3.2   Scr 0.97  Patient is not taking PO -  will order KCL 10 meq IV x 4. Will recheck K at 1800. Will f/u electrolytes with am labs   Aanyah Loa A 10/30/2018 7:37 AM

## 2018-10-31 LAB — BASIC METABOLIC PANEL
Anion gap: 4 — ABNORMAL LOW (ref 5–15)
BUN: 6 mg/dL — ABNORMAL LOW (ref 8–23)
CO2: 23 mmol/L (ref 22–32)
Calcium: 8.4 mg/dL — ABNORMAL LOW (ref 8.9–10.3)
Chloride: 112 mmol/L — ABNORMAL HIGH (ref 98–111)
Creatinine, Ser: 1.06 mg/dL — ABNORMAL HIGH (ref 0.44–1.00)
GFR calc Af Amer: 58 mL/min — ABNORMAL LOW (ref >60)
GFR calc non Af Amer: 50 mL/min — ABNORMAL LOW (ref >60)
Glucose, Bld: 169 mg/dL — ABNORMAL HIGH (ref 70–99)
Potassium: 4.2 mmol/L (ref 3.5–5.1)
Sodium: 139 mmol/L (ref 135–145)

## 2018-10-31 LAB — VITAMIN B12: Vitamin B-12: 608 pg/mL (ref 180–914)

## 2018-10-31 LAB — PHOSPHORUS: Phosphorus: 2.2 mg/dL — ABNORMAL LOW (ref 2.5–4.6)

## 2018-10-31 LAB — MAGNESIUM: Magnesium: 1.8 mg/dL (ref 1.7–2.4)

## 2018-10-31 LAB — GLUCOSE, CAPILLARY
Glucose-Capillary: 216 mg/dL — ABNORMAL HIGH (ref 70–99)
Glucose-Capillary: 205 mg/dL — ABNORMAL HIGH (ref 70–99)
Glucose-Capillary: 115 mg/dL — ABNORMAL HIGH (ref 70–99)

## 2018-10-31 MED ORDER — ASPIRIN 81 MG PO CHEW: 81.0000 mg | CHEWABLE_TABLET | Freq: Every day | ORAL | 0 refills | Status: DC

## 2018-10-31 MED ORDER — K PHOS MONO-SOD PHOS DI & MONO 155-852-130 MG PO TABS
500.0000 mg | ORAL_TABLET | ORAL | Status: DC
  Administered 2018-10-31 (×2): 500 mg via ORAL
  Filled 2018-10-31 (×4): qty 2

## 2018-10-31 MED ORDER — CEFUROXIME AXETIL 250 MG PO TABS: 250.0000 mg | ORAL_TABLET | Freq: Two times a day (BID) | ORAL | 0 refills | Status: AC

## 2018-10-31 MED ORDER — LOVASTATIN 20 MG PO TABS: 20.0000 mg | ORAL_TABLET | Freq: Every evening | ORAL | 0 refills | Status: AC

## 2018-10-31 MED ORDER — POLYETHYLENE GLYCOL 3350 17 G PO PACK
17.0000 g | PACK | Freq: Every day | ORAL | Status: DC
  Administered 2018-10-31: 17 g via ORAL
  Filled 2018-10-31: qty 1

## 2018-10-31 NOTE — Discharge Instructions (Signed)
Palliative care nurse to follow after discharge at the facility.

## 2018-10-31 NOTE — Progress Notes (Signed)
Pharmacy Electrolyte Monitoring Consult:  Pharmacy consulted to assist in monitoring and replacing electrolytes in this 78 y.o. female admitted on 10/27/2018 with Altered Mental Status   Labs:  Sodium (mmol/L)  Date Value  10/31/2018 139  08/18/2014 141   Potassium (mmol/L)  Date Value  10/31/2018 4.2  08/18/2014 3.8   Magnesium (mg/dL)  Date Value  60/67/7034 1.8   Phosphorus (mg/dL)  Date Value  03/52/4818 2.2 (L)   Calcium (mg/dL)  Date Value  59/03/3111 8.4 (L)   Calcium, Total (mg/dL)  Date Value  16/24/4695 8.5   Albumin (g/dL)  Date Value  02/07/5749 3.9  11/15/2013 3.5    Assessment/Plan: 4/13  K 4.2 Mag 1.8  Phos 2.2   Scr 1.06  Will order Potassium Phosphate Neutral tabs- 2 tabs PO q4h x4 doses. Will f/u electrolytes with am labs   Angelique Blonder, PharmD Clinical Pharmacist 10/31/2018 8:26 AM

## 2018-10-31 NOTE — TOC Initial Note (Signed)
Transition of Care [facility](TOC) - Initial/Assessment Note    Patient Details  Name: [name] [name] MRN: [id number] Date of Birth: 1940-07-27  Transition of Care [facility](TOC) CM/SW Contact:    [name] KernsJennifer L Judeen Geralds, RN Phone Number: 10/31/2018, 1:36 PM  Clinical Narrative:  [facility]  Admitted with AMS and UTI; elevated lactic at 5.1.  Poor historian; called and spoke with daughter, [name] RegalCarol-- [phone].  She states her mother uses a wheelchair mostly and has a walker.  Not on oxygen in the SNF.  [facility] without difficulty.  Current with PCP.  [facility]  Daughter would like out patient palliative to follow at discharge;  Dr. Elisabeth PigeonVachhani aware.  [facility]-will notify her when ready to DC.                    Expected Discharge Plan: Skilled Nursing Facility Barriers to Discharge: Continued Medical Work up   Patient Goals and CMS Choice Patient states their goals for this hospitalization and ongoing recovery are:: Confused  CMS Medicare.gov Compare Post Acute Care list provided to:: Patient Represenative (must comment)([name]-daughter)    Expected Discharge Plan and Services Expected Discharge Plan: Skilled Nursing Facility   Discharge Planning Services: CM Consult   Living arrangements for the past 2 months: Skilled Nursing Facility([facility])                          Prior Living Arrangements/Services Living arrangements for the past 2 months: Skilled Nursing Facility([facility])   Patient language and need for interpreter reviewed:: Yes              Criminal Activity/Legal Involvement Pertinent to Current Situation/Hospitalization: No - Comment as needed  Activities of Daily Living Home Assistive Devices/Equipment: Wheelchair ADL Screening (condition at time of admission) Patient's cognitive ability adequate to safely  complete daily activities?: No Is the patient deaf or have difficulty hearing?: No Does the patient have difficulty seeing, even when wearing glasses/contacts?: No Does the patient have difficulty concentrating, remembering, or making decisions?: Yes Patient able to express need for assistance with ADLs?: No Does the patient have difficulty dressing or bathing?: Yes Independently performs ADLs?: No Dressing (OT): Needs assistance Is this a change from baseline?: Pre-admission baseline Grooming: Needs assistance Is this a change from baseline?: Pre-admission baseline Feeding: Needs assistance Is this a change from baseline?: Pre-admission baseline Bathing: Needs assistance Is this a change from baseline?: Pre-admission baseline Toileting: Needs assistance Is this a change from baseline?: Pre-admission baseline In/Out Bed: Needs assistance Is this a change from baseline?: Pre-admission baseline Walks in Home: Dependent Is this a change from baseline?: Pre-admission baseline Does the patient have difficulty walking or climbing stairs?: Yes Weakness of Legs: Both Weakness of Arms/Hands: None  Permission Sought/Granted Permission sought to share information with : Oceanographeracility Contact Representative Permission granted to share information with : Yes, AnimatorVerbal Permission Granted([name]-daughter)     Permission granted to share info w AGENCY: [facility]        Emotional Assessment       Orientation: : Fluctuating Orientation (Suspected and/or reported Sundowners) Alcohol / Substance Use: Not Applicable    Admission diagnosis:  Lactic acidosis [E87.2] AKI (acute kidney injury) (HCC) [N17.9] Cystitis [N30.90] Septic shock (HCC) [A41.9, R65.21] Patient Active Problem List  Diagnosis Date Noted  . Sepsis (HCC) 10/27/2018  . Iron deficiency anemia due to chronic blood loss 09/06/2018  . Pressure injury of skin 09/04/2018  . ARF (acute renal failure) (HCC) 09/03/2018  . GI bleed  07/15/2018  . DKA (diabetic ketoacidoses) (HCC) 03/18/2018  . Upper GI bleed 10/16/2015  . Intractable nausea and vomiting 10/16/2015  . Hypertensive urgency 10/16/2015  . Alzheimer's disease (HCC) 05/30/2015  . Altered mental status 05/24/2015  . Occlusion and stenosis of carotid artery without mention of cerebral infarction 10/08/2011  . CKD (chronic kidney disease) 09/25/2011  . H/O: CVA (cardiovascular accident) 09/22/2011  . Diabetes mellitus, type 2 (HCC) 09/22/2011   PCP:  Karie Schwalbe, MD Pharmacy:   [facility], [place] - [address] [place] [address] Phone: [phone] Fax: [phone]     Social Determinants of Health (SDOH) Interventions    Readmission Risk Interventions Readmission Risk Prevention Plan 10/31/2018  Transportation Screening Complete  Medication Review (RN Care Manager) Complete  PCP or Specialist appointment within 3-5 days of discharge Complete  HRI or Home Care Consult Complete  SW Recovery Care/Counseling Consult Patient refused  Palliative Care Screening Complete  Skilled Nursing Facility Complete  Some recent data might be hidden

## 2018-10-31 NOTE — TOC Transition Note (Signed)
Transition of Care [facility]) - CM/SW Discharge Note   Patient Details  Name: [name] [name] MRN: [id number] Date of Birth: 08-27-1940  Transition [facility]) CM/SW Contact:  Sherren Kerns, RN Phone Number: 10/31/2018, 3:39 PM   Clinical Narrative:   Patient is discharging back to [facility].  FL2 and DC summary faxed to [facility].  Patient will go to room 205B; Molli Hazard RN, given phone number to call [phone].  Packet placed on chart.      Final next level of care: Skilled Nursing Facility Barriers to Discharge: No Barriers Identified   Patient Goals and CMS Choice Patient states their goals for this hospitalization and ongoing recovery are:: Confused  CMS Medicare.gov Compare Post Acute Care list provided to:: Patient Represenative (must comment)([name]-daughter)    Discharge Placement                Patient to be transferred to facility by: [facility] Name of family member notified: Holmes,[name] Daughter [phone]  Patient and family notified of of transfer: 10/31/18  Discharge Plan and Services   Discharge Planning Services: CM Consult                      Social Determinants of Health (SDOH) Interventions     Readmission Risk Interventions Readmission Risk Prevention Plan 10/31/2018  Transportation Screening Complete  Medication Review Oceanographer) Complete  PCP or Specialist appointment within 3-5 days of discharge Complete  HRI or Home Care Consult Complete  SW Recovery Care/Counseling Consult Patient refused  Palliative Care Screening Complete  Skilled Nursing Facility Complete  Some recent data might be hidden

## 2018-10-31 NOTE — Discharge Summary (Signed)
[facility] [place] [facility]   PATIENT NAME: [name] RibasSophie [name]    MR#:  [id number]  DATE OF BIRTH:  11/18/1940  DATE OF ADMISSION:  10/27/2018 ADMITTING PHYSICIAN: Ihor AustinPavan Pyreddy, MD  DATE OF DISCHARGE: 10/31/2018   PRIMARY CARE PHYSICIAN: Karie SchwalbeLetvak, Richard I, MD    ADMISSION DIAGNOSIS:  Lactic acidosis [E87.2] AKI (acute kidney injury) (HCC) [N17.9] Cystitis [N30.90] Septic shock (HCC) [A41.9, R65.21]  DISCHARGE DIAGNOSIS:  Active Problems:   Sepsis (HCC)   SECONDARY DIAGNOSIS:   Past Medical History:  Diagnosis Date  . Allergic rhinitis   . CAD (coronary artery disease)   . Carotid artery occlusion 02/15/2009  . CVA (cerebral infarction)   . Dementia (HCC)   . Diabetes mellitus   . Gastritis   . GERD (gastroesophageal reflux disease)   . Heart murmur   . Hematemesis   . Hemiplegia (HCC)   . HLD (hyperlipidemia)   . Hypertension   . Hypertensive chronic kidney disease   . Macular degeneration   . Persistent mood (affective) disorder, unspecified (HCC)   . Stroke (HCC)   . Thyroid disease    hypothyroidism  . Ulcer of esophagus without bleeding     HOSPITAL COURSE:   78 year old female patientwith a known history ofcoronary artery disease, CVA, dementia, type 2 diabetesmellitus, GERD was referred for low blood pressure and decreased responsiveness.   -Severe sepsis: Present on admission due to UTI Lactic Level trending down  *Hypokalemia/hypomagnesemia -Replete and recheck- improved.  -Hypotension secondary to sepsis -Resolved  -E. coli urinary tract infection: Urine culture growing klebsiella and E. coli -Given IV Ancef based on culture sensitivity  switch on oral cefuroxime on discharge.  - Hypernatremia: Resolved  -Acute on chronic kidney disease stage II Avoid nephrotoxic medication IV fluid hydration Monitor renal function  back to baseline.  * Constipation: start colace  * iron defi anemia: gets  [facility] at [facility] center.   DISCHARGE CONDITIONS:   Stable.  CONSULTS OBTAINED:    DRUG ALLERGIES:   Allergies  Allergen Reactions  . Fexofenadine Hcl Rash    DISCHARGE MEDICATIONS:   Allergies as of 10/31/2018      Reactions   Fexofenadine Hcl Rash      Medication List    TAKE these medications   acetaminophen 325 MG tablet Commonly known as:  TYLENOL Take 650 mg by mouth every 4 (four) hours as needed.   aluminum hydroxide ointment Commonly known as:  DERMAGRAN Apply topically every 12 (twelve) hours as needed. Skin protectant to buttocks   aspirin 81 MG chewable tablet Chew 1 tablet (81 mg total) by mouth daily.   cefUROXime 250 MG tablet Commonly known as:  Ceftin Take 1 tablet (250 mg total) by mouth 2 (two) times daily for 3 days.   citalopram 10 MG tablet Commonly known as:  CELEXA Take 5 mg by mouth daily.   gabapentin 300 MG capsule Commonly known as:  NEURONTIN Take 300 mg by mouth every evening.   guaiFENesin 100 MG/5ML liquid Commonly known as:  ROBITUSSIN Take 200 mg by mouth every 4 (four) hours as needed for cough.   hydroxypropyl methylcellulose / hypromellose 2.5 % ophthalmic solution Commonly known as:  ISOPTO TEARS / GONIOVISC Place 1 drop into both eyes 3 (three) times daily as needed for dry eyes.   insulin glargine 100 UNIT/ML injection Commonly known as:  LANTUS Inject 0.1 mLs (10 Units total) into the skin daily. What changed:    how much  to take  when to take this   levothyroxine 88 MCG tablet Commonly known as:  SYNTHROID, LEVOTHROID Take 88 mcg by mouth every evening.   lovastatin 20 MG tablet Commonly known as:  MEVACOR Take 1 tablet (20 mg total) by mouth every evening.   memantine 10 MG tablet Commonly known as:  NAMENDA Take 10 mg by mouth 2 (two) times daily.   mirtazapine 15 MG tablet Commonly known as:  REMERON Take 15 mg by mouth at bedtime.   omeprazole 20 MG capsule Commonly known as:   PRILOSEC Take 20 mg by mouth 2 (two) times daily.   ondansetron 4 MG tablet Commonly known as:  ZOFRAN Take 4 mg by mouth daily as needed for nausea or vomiting.   polyethylene glycol 17 g packet Commonly known as:  MIRALAX / GLYCOLAX Take 17 g by mouth daily.   Ricola Cherry Honey Herb 2 MG Lozg Generic drug:  Menthol Use as directed 1 lozenge in the mouth or throat every 2 (two) hours as needed (FOR COUGH).   senna-docusate 8.6-50 MG tablet Commonly known as:  Senokot-S Take 2 tablets by mouth daily.   Vitamin D3 1.25 MG (50000 UT) Caps Take 1 capsule by mouth every 30 (thirty) days.        DISCHARGE INSTRUCTIONS:    Follow with PMD in 1-2 weeks., pallaitive care to follow at facility after discharge.  If you experience worsening of your admission symptoms, develop shortness of breath, life threatening emergency, suicidal or homicidal thoughts you must seek medical attention immediately by calling 911 or calling your MD immediately  if symptoms less severe.  You Must read complete instructions/literature along with all the possible adverse reactions/side effects for all the Medicines you take and that have been prescribed to you. Take any new Medicines after you have completely understood and accept all the possible adverse reactions/side effects.   Please note  You were cared for by a hospitalist during your hospital stay. If you have any questions about your discharge medications or the care you received while you were in the hospital after you are discharged, you can call the unit and asked to speak with the hospitalist on call if the hospitalist that took care of you is not available. Once you are discharged, your primary care physician will handle any further medical issues. Please note that NO REFILLS for any discharge medications will be authorized once you are discharged, as it is imperative that you return to your primary care physician (or establish a relationship  with a primary care physician if you do not have one) for your aftercare needs so that they can reassess your need for medications and monitor your lab values.    Today   CHIEF COMPLAINT:   Chief Complaint  Patient presents with  . Altered Mental Status    HISTORY OF PRESENT ILLNESS:  Alaria Oconnor  is a 78 y.o. female with a known history of hyperlipidemia, hypertension,, GERD is a resident of [facility] facility. HISTORY OF PRESENT ILLNESS: Nzinga Ferran  is a 78 y.o. female with a known history of coronary artery disease, CVA, dementia, type 2 diabet  Patient was referred for low blood pressure and decreased responsiveness.  Patient has poor oral intake for the last couple of days.  No history of any cough, fever.  Blood pressure was low.  Patient was evaluated and in the emergency room lactic acid level was high code sepsis was called and patient was started on  fluid resuscitation.  Blood pressure started improving after fluid resuscitation was started.  It also became more responsive.  Patient received with broad-spectrum IV vancomycin and cefepime antibiotics.  According to the nursing home transfer note no history of any sore throat, shortness of breath, cough.   VITAL SIGNS:  Blood pressure 127/79, pulse 87, temperature 98.7 F (37.1 C), resp. rate 18, height 5' (1.524 m), weight 62.1 kg, SpO2 100 %.  I/O:    Intake/Output Summary (Last 24 hours) at 10/31/2018 1504 Last data filed at 10/31/2018 1407 Gross per 24 hour  Intake 340 ml  Output 500 ml  Net -160 ml    PHYSICAL EXAMINATION:  HENT:     Head: Normocephalic and atraumatic.  Eyes:     Conjunctiva/sclera: Conjunctivae normal.     Pupils: Pupils are equal, round, and reactive to light.  Neck:     Musculoskeletal: Normal range of motion and neck supple.     Thyroid: No thyromegaly.     Trachea: No tracheal deviation.  Cardiovascular:     Rate and Rhythm: Normal rate and regular rhythm.     Heart sounds: Normal  heart sounds.  Pulmonary:     Effort: Pulmonary effort is normal. No respiratory distress.     Breath sounds: Normal breath sounds. No wheezing.  Chest:     Chest wall: No tenderness.  Abdominal:     General: Bowel sounds are normal. There is no distension.     Palpations: Abdomen is soft.     Tenderness: There is no abdominal tenderness.  Musculoskeletal: Normal range of motion.  Skin:    General: Skin is warm and dry.     Findings: No rash.  Neurological:     Mental Status: She is alert. She is confused.     Cranial Nerves: No cranial nerve deficit.  Psychiatric:     Comments: Unable to evaluate due to her mental status   DATA REVIEW:   CBC Recent Labs  Lab 10/30/18 0358  WBC 5.5  HGB 11.6*  HCT 36.0  PLT 149*    Chemistries  Recent Labs  Lab 10/27/18 1023  10/31/18 0453  NA 144   < > 139  K 3.5   < > 4.2  CL 98   < > 112*  CO2 27   < > 23  GLUCOSE 218*   < > 169*  BUN 42*   < > 6*  CREATININE 3.77*   < > 1.06*  CALCIUM 9.8   < > 8.4*  MG  --    < > 1.8  AST 26  --   --   ALT 17  --   --   ALKPHOS 87  --   --   BILITOT 0.7  --   --    < > = values in this interval not displayed.    Cardiac Enzymes No results for input(s): TROPONINI in the last 168 hours.  Microbiology Results  Results for orders placed or performed during the hospital encounter of 10/27/18  Blood Culture (routine x 2)     Status: None (Preliminary result)   Collection Time: 10/27/18 10:24 AM  Result Value Ref Range Status   Specimen Description BLOOD RIGHT [facility]  Final   Special Requests   Final    BOTTLES DRAWN AEROBIC AND ANAEROBIC Blood Culture adequate volume   Culture   Final    NO GROWTH 4 DAYS Performed at [facility], [address], [place], [place]  [address]    Report Status PENDING  Incomplete  Blood Culture (routine x 2)     Status: None (Preliminary result)   Collection Time: 10/27/18 10:25 AM  Result Value Ref Range Status   Specimen Description BLOOD  LEFT AC  Final   Special Requests   Final    BOTTLES DRAWN AEROBIC AND ANAEROBIC Blood Culture adequate volume   Culture   Final    NO GROWTH 4 DAYS Performed at [facility], [address] [place], [place] [address]    Report Status PENDING  Incomplete  Urine culture     Status: Abnormal   Collection Time: 10/27/18 10:25 AM  Result Value Ref Range Status   Specimen Description   Final    URINE, RANDOM Performed at [facility], [address] [place], [place] [address]    Special Requests   Final    NONE Performed at [facility], [address], [place], [place] [address]    Culture (A)  Final    >=100,000 COLONIES/mL ESCHERICHIA COLI 50,000 COLONIES/mL KLEBSIELLA PNEUMONIAE    Report Status 10/30/2018 FINAL  Final   Organism ID, Bacteria ESCHERICHIA COLI (A)  Final   Organism ID, Bacteria KLEBSIELLA PNEUMONIAE (A)  Final      Susceptibility   Escherichia coli - MIC*    AMPICILLIN >=32 RESISTANT Resistant     CEFAZOLIN <=4 SENSITIVE Sensitive     CEFTRIAXONE <=1 SENSITIVE Sensitive     CIPROFLOXACIN <=0.25 SENSITIVE Sensitive     GENTAMICIN <=1 SENSITIVE Sensitive     IMIPENEM <=0.25 SENSITIVE Sensitive     NITROFURANTOIN <=16 SENSITIVE Sensitive     TRIMETH/SULFA >=320 RESISTANT Resistant     AMPICILLIN/SULBACTAM >=32 RESISTANT Resistant     PIP/TAZO <=4 SENSITIVE Sensitive     Extended ESBL NEGATIVE Sensitive     * >=100,000 COLONIES/mL ESCHERICHIA COLI   Klebsiella pneumoniae - MIC*    AMPICILLIN >=32 RESISTANT Resistant     CEFAZOLIN <=4 SENSITIVE Sensitive     CEFTRIAXONE <=1 SENSITIVE Sensitive     CIPROFLOXACIN <=0.25 SENSITIVE Sensitive     GENTAMICIN <=1 SENSITIVE Sensitive     IMIPENEM <=0.25 SENSITIVE Sensitive     NITROFURANTOIN 32 SENSITIVE Sensitive     TRIMETH/SULFA <=20 SENSITIVE Sensitive     AMPICILLIN/SULBACTAM 8 SENSITIVE Sensitive     PIP/TAZO <=4 SENSITIVE Sensitive     Extended ESBL NEGATIVE Sensitive     *  50,000 COLONIES/mL KLEBSIELLA PNEUMONIAE  MRSA PCR Screening     Status: None   Collection Time: 10/28/18  7:00 PM  Result Value Ref Range Status   MRSA by PCR NEGATIVE NEGATIVE Final    Comment:        The GeneXpert MRSA Assay (FDA approved for NASAL specimens only), is one component of a comprehensive MRSA colonization surveillance program. It is not intended to diagnose MRSA infection nor to guide or monitor treatment for MRSA infections. Performed at [facility], [address], [place], [place] [address]     RADIOLOGY:  No results found.  EKG:   Orders placed or performed during the hospital encounter of 10/27/18  . EKG 12-Lead  . EKG 12-Lead  . EKG 12-Lead  . EKG 12-Lead      Management plans discussed with the patient, family and they are in agreement.  CODE STATUS:     Code Status Orders  (From admission, onward)         Start  Ordered   10/27/18 1350  Full code  Continuous     10/27/18 1349        Code Status History    Date Active Date Inactive Code Status Order ID Comments User Context   09/03/2018 1446 09/05/2018 1537 Full Code [id number]  Bertrum Sol, MD Inpatient   08/21/2018 1422 08/23/2018 1455 DNR [id number]  Willy Eddy, MD ED   07/15/2018 1522 07/17/2018 1923 Full Code [id number]  Ihor Austin, MD Inpatient   03/18/2018 1145 03/20/2018 1722 Full Code [id number]  Salary, Evelena Asa, MD ED   10/16/2015 0916 10/17/2015 1938 Full Code [id number]  Hower, Cletis Athens, MD ED   05/24/2015 1109 05/31/2015 1833 Full Code [id number]  Gale Journey, MD Inpatient   09/23/2011 0031 09/28/2011 1933 Full Code [id number]  Garnett-Mellinger, Vennie Homans, RN Inpatient      TOTAL TIME TAKING CARE OF THIS PATIENT: 35 minutes.    [name] M.D on 10/31/2018 at 3:04 PM  Between 7am to 6pm - Pager - [phone]  After 6pm go to [email or web address] - password [email or web address]  [facility]  Office  [phone]  CC: Primary care  physician; Karie Schwalbe, MD   Note: This dictation was prepared with Dragon dictation along with smaller phrase technology. Any transcriptional errors that result from this process are unintentional.

## 2018-10-31 NOTE — NC FL2 (Signed)
MEDICAID FL2 LEVEL OF CARE SCREENING TOOL     IDENTIFICATION  Patient Name: [name] [name] Human Birthdate: 11/24/1940 Sex: female Admission Date (Current Location): 10/27/2018  [place] and [place] Number:  [address] [id number] Q Facility and Address:  [facility], [address] [place], [place] [address]      Provider Number: [id number]  Attending Physician Name and Address:  [name], *  Relative Name and Phone Number:  [name] [phone]    Current Level of Care: Hospital Recommended Level of Care: Skilled Nursing Facility Prior Approval Number:    Date Approved/Denied:   PASRR Number: [id number]  Discharge Plan: SNF    Current Diagnoses: Patient Active Problem List   Diagnosis Date Noted  . Sepsis (HCC) 10/27/2018  . Iron deficiency anemia due to chronic blood loss 09/06/2018  . Pressure injury of skin 09/04/2018  . ARF (acute renal failure) (HCC) 09/03/2018  . GI bleed 07/15/2018  . DKA (diabetic ketoacidoses) (HCC) 03/18/2018  . Upper GI bleed 10/16/2015  . Intractable nausea and vomiting 10/16/2015  . Hypertensive urgency 10/16/2015  . Alzheimer's disease (HCC) 05/30/2015  . Altered mental status 05/24/2015  . Occlusion and stenosis of carotid artery without mention of cerebral infarction 10/08/2011  . CKD (chronic kidney disease) 09/25/2011  . H/O: CVA (cardiovascular accident) 09/22/2011  . Diabetes mellitus, type 2 (HCC) 09/22/2011    Orientation RESPIRATION BLADDER Height & Weight        Normal Incontinent Weight: 62.1 kg Height:  5' (152.4 cm)  BEHAVIORAL SYMPTOMS/MOOD NEUROLOGICAL BOWEL NUTRITION STATUS      Continent Diet(soft; thin fluid)  AMBULATORY STATUS COMMUNICATION OF NEEDS Skin   Extensive Assist Verbally Other (Comment)(left and right heel pressure injury)                       Personal Care Assistance Level of Assistance  Bathing, Feeding, Dressing Bathing  Assistance: Maximum assistance Feeding assistance: Limited assistance Dressing Assistance: Maximum assistance     Functional Limitations Info  Sight, Hearing, Speech Sight Info: Adequate Hearing Info: Impaired Speech Info: Adequate    SPECIAL CARE FACTORS FREQUENCY  PT (By licensed PT)     PT Frequency: 5 X week              Contractures Contractures Info: Not present    Additional Factors Info  Code Status, Allergies Code Status Info: Full Allergies Info: Fexofenadine Hcl           Current Medications (10/31/2018):  This is the current hospital active medication list Current Facility-Administered Medications  Medication Dose Route Frequency Provider Last Rate Last Dose  . 0.9 %  sodium chloride infusion   Intravenous PRN Delfino Lovett, MD   Stopped at 10/30/18 1403  . acetaminophen (TYLENOL) tablet 650 mg  650 mg Oral Q6H PRN Ihor Austin, MD       Or  . acetaminophen (TYLENOL) suppository 650 mg  650 mg Rectal Q6H PRN Pyreddy, Vivien Rota, MD      . aspirin chewable tablet 81 mg  81 mg Oral Daily Pyreddy, Vivien Rota, MD   81 mg at 10/31/18 0949  . ceFAZolin (ANCEF) IVPB 1 g/50 mL premix  1 g Intravenous Q8H Delfino Lovett, MD 100 mL/hr at 10/31/18 0513 1 g at 10/31/18 0513  . citalopram (CELEXA) tablet 5 mg  5 mg Oral Daily Delfino Lovett, MD   5 mg at 10/31/18 0949  . docusate sodium (COLACE) capsule 200 mg  200 mg Oral  BID Delfino LovettShah, Vipul, MD   200 mg at 10/31/18 0949  . gabapentin (NEURONTIN) tablet 300 mg  300 mg Oral QHS Delfino LovettShah, Vipul, MD   300 mg at 10/30/18 2157  . heparin injection 5,000 Units  5,000 Units Subcutaneous Q12H Ihor AustinPyreddy, Pavan, MD   5,000 Units at 10/31/18 0950  . insulin aspart (novoLOG) injection 0-5 Units  0-5 Units Subcutaneous QHS Pyreddy, Pavan, MD      . insulin aspart (novoLOG) injection 0-9 Units  0-9 Units Subcutaneous TID WC Ihor AustinPyreddy, Pavan, MD   3 Units at 10/31/18 0951  . levothyroxine (SYNTHROID, LEVOTHROID) tablet 88 mcg  88 mcg Oral QPM Ihor AustinPyreddy, Pavan, MD    88 mcg at 10/31/18 0514  . memantine (NAMENDA) tablet 10 mg  10 mg Oral BID Ihor AustinPyreddy, Pavan, MD   10 mg at 10/31/18 0949  . mirtazapine (REMERON) tablet 15 mg  15 mg Oral QHS Delfino LovettShah, Vipul, MD   15 mg at 10/30/18 2158  . ondansetron (ZOFRAN) tablet 4 mg  4 mg Oral Q6H PRN Pyreddy, Vivien RotaPavan, MD       Or  . ondansetron (ZOFRAN) injection 4 mg  4 mg Intravenous Q6H PRN Pyreddy, Pavan, MD      . pantoprazole (PROTONIX) EC tablet 40 mg  40 mg Oral BID AC Pyreddy, Vivien RotaPavan, MD   40 mg at 10/31/18 0949  . phosphorus (K PHOS NEUTRAL) tablet 500 mg  500 mg Oral Q4H Altamese DillingVachhani, Vaibhavkumar, MD   500 mg at 10/31/18 0949  . polyethylene glycol (MIRALAX / GLYCOLAX) packet 17 g  17 g Oral Daily Altamese DillingVachhani, Vaibhavkumar, MD      . pravastatin (PRAVACHOL) tablet 20 mg  20 mg Oral q1800 Ihor AustinPyreddy, Pavan, MD   20 mg at 10/30/18 1820  . senna-docusate (Senokot-S) tablet 1 tablet  1 tablet Oral QHS PRN Pyreddy, Pavan, MD      . sodium chloride 0.9 % bolus 250 mL  250 mL Intravenous Once Sharman CheekStafford, Phillip, MD      . sodium chloride flush (NS) 0.9 % injection 3 mL  3 mL Intravenous Q12H Sherryll BurgerShah, Vipul, MD   3 mL at 10/30/18 2200  . sodium chloride flush (NS) 0.9 % injection 3 mL  3 mL Intravenous Q12H Delfino LovettShah, Vipul, MD   3 mL at 10/30/18 2200  . sodium chloride flush (NS) 0.9 % injection 3 mL  3 mL Intravenous PRN Delfino LovettShah, Vipul, MD         Discharge Medications: Please see discharge summary for a list of discharge medications.  Relevant Imaging Results:  Relevant Lab Results:   Additional Information ssn [id number]  Sherren KernsJennifer L Ladaija Dimino, RN

## 2018-10-31 NOTE — Care Management Important Message (Signed)
Important Message  Patient Details  Name: [name] [name] MRN: [id number] Date of Birth: 1941/05/23   Medicare Important Message Given:  Yes    [name] 10/31/2018, 11:11 AM

## 2018-11-01 DIAGNOSIS — R6521 Severe sepsis with septic shock: Secondary | ICD-10-CM

## 2018-11-01 DIAGNOSIS — N390 Urinary tract infection, site not specified: Secondary | ICD-10-CM | POA: Diagnosis not present

## 2018-11-01 LAB — CULTURE, BLOOD (ROUTINE X 2)
Culture: NO GROWTH
Culture: NO GROWTH
Special Requests: ADEQUATE
Special Requests: ADEQUATE

## 2018-12-28 ENCOUNTER — Inpatient Hospital Stay: Payer: Medicare Other

## 2018-12-28 ENCOUNTER — Encounter: Payer: Self-pay | Admitting: Oncology

## 2018-12-28 ENCOUNTER — Other Ambulatory Visit: Payer: Self-pay

## 2018-12-28 ENCOUNTER — Inpatient Hospital Stay: Payer: Medicare Other | Attending: Oncology

## 2018-12-28 ENCOUNTER — Inpatient Hospital Stay (HOSPITAL_BASED_OUTPATIENT_CLINIC_OR_DEPARTMENT_OTHER): Payer: Medicare Other | Admitting: Oncology

## 2018-12-28 VITALS — BP 142/83 | HR 96 | Temp 96.6°F | Wt 138.4 lb

## 2018-12-28 DIAGNOSIS — D500 Iron deficiency anemia secondary to blood loss (chronic): Secondary | ICD-10-CM

## 2018-12-28 DIAGNOSIS — D508 Other iron deficiency anemias: Secondary | ICD-10-CM | POA: Insufficient documentation

## 2018-12-28 DIAGNOSIS — Z794 Long term (current) use of insulin: Secondary | ICD-10-CM | POA: Insufficient documentation

## 2018-12-28 DIAGNOSIS — E119 Type 2 diabetes mellitus without complications: Secondary | ICD-10-CM | POA: Insufficient documentation

## 2018-12-28 DIAGNOSIS — Z87891 Personal history of nicotine dependence: Secondary | ICD-10-CM

## 2018-12-28 DIAGNOSIS — E039 Hypothyroidism, unspecified: Secondary | ICD-10-CM | POA: Diagnosis not present

## 2018-12-28 DIAGNOSIS — F0390 Unspecified dementia without behavioral disturbance: Secondary | ICD-10-CM | POA: Diagnosis not present

## 2018-12-28 DIAGNOSIS — Z7982 Long term (current) use of aspirin: Secondary | ICD-10-CM

## 2018-12-28 DIAGNOSIS — I10 Essential (primary) hypertension: Secondary | ICD-10-CM | POA: Insufficient documentation

## 2018-12-28 DIAGNOSIS — Z8673 Personal history of transient ischemic attack (TIA), and cerebral infarction without residual deficits: Secondary | ICD-10-CM

## 2018-12-28 DIAGNOSIS — E785 Hyperlipidemia, unspecified: Secondary | ICD-10-CM | POA: Insufficient documentation

## 2018-12-28 DIAGNOSIS — Z79899 Other long term (current) drug therapy: Secondary | ICD-10-CM | POA: Insufficient documentation

## 2018-12-28 LAB — CBC WITH DIFFERENTIAL/PLATELET
Abs Immature Granulocytes: 0.04 10*3/uL (ref 0.00–0.07)
Basophils Absolute: 0.1 10*3/uL (ref 0.0–0.1)
Basophils Relative: 1 %
Eosinophils Absolute: 0.2 10*3/uL (ref 0.0–0.5)
Eosinophils Relative: 2 %
HCT: 42.2 % (ref 36.0–46.0)
Hemoglobin: 13.1 g/dL (ref 12.0–15.0)
Immature Granulocytes: 0 %
Lymphocytes Relative: 18 %
Lymphs Abs: 1.7 10*3/uL (ref 0.7–4.0)
MCH: 27.2 pg (ref 26.0–34.0)
MCHC: 31 g/dL (ref 30.0–36.0)
MCV: 87.6 fL (ref 80.0–100.0)
Monocytes Absolute: 0.6 10*3/uL (ref 0.1–1.0)
Monocytes Relative: 6 %
Neutro Abs: 6.7 10*3/uL (ref 1.7–7.7)
Neutrophils Relative %: 73 %
Platelets: 201 10*3/uL (ref 150–400)
RBC: 4.82 MIL/uL (ref 3.87–5.11)
RDW: 14.5 % (ref 11.5–15.5)
WBC: 9.2 10*3/uL (ref 4.0–10.5)
nRBC: 0 % (ref 0.0–0.2)

## 2018-12-28 LAB — FERRITIN: Ferritin: 325 ng/mL — ABNORMAL HIGH (ref 11–307)

## 2018-12-28 LAB — IRON AND TIBC
Iron: 92 ug/dL (ref 28–170)
Saturation Ratios: 35 % — ABNORMAL HIGH (ref 10.4–31.8)
TIBC: 266 ug/dL (ref 250–450)
UIBC: 174 ug/dL

## 2018-12-28 NOTE — Progress Notes (Signed)
Hematology/Oncology follow up note [facility] [phone] [phone]   Patient Care Team: Karie SchwalbeLetvak, Richard I, MD as PCP - General (Internal Medicine)  REFERRING PROVIDER: Dr. Allegra LaiVanga CHIEF COMPLAINTS/REASON FOR VISIT:  Evaluation of iron deficiency anemia  HISTORY OF PRESENTING ILLNESS:  [name] [name] is a  78 y.o.  female with PMH listed below who was referred to me for evaluation of iron deficiency anemia Patient has advanced dementia, not able to provide any history.  No family present.  Accompanied by caregiver from facility.  She is napping comfortably in her wheelchair not able to have a conversation with me, nonverbal.  I have to call patient's power of attorney daughter [name] [name] and main history was obtained from medical chart review and conversation with [name] Regalarol.  She is a long-term [facility] nursing home resident.  Per daughter, patient was able to have simple conversation with family members at baseline and to recently she appears to be more fatigued and sleeps a lot.  Patient was admitted from 08/21/2020 08/23/2018.  Patient was sent to emergency room after coffee-ground emesis noted by staff at the facility.    She was seen and evaluated by gastroenterology Dr. Allegra LaiVanga during the hospitalization.  08/22/2018 upper endoscopy showed normal duodenal bulb and second portion of duodenum.  Multiple gastric polyps.  Medium size hiatal hernia.  Nonbleeding esophageal ulcer.  Grade B reflux esophagitis.  No specimens collected.  Review patient's medication list from nursing home.  She is on aspirin 81 mg daily, ferrous sulfate 325 mg daily with breakfast.  Protonix 40 mg twice daily.  INTERVAL HISTORY [name] [name] is a 78 y.o. female who has above history reviewed by me today presents for follow up visit for management of iron deficiency anemia Problems and complaints are listed below: Patient is demented, meaningful history.She says  'I cant sleep:.     Review of Systems  Unable to perform ROS: Dementia  Constitutional: Positive for fatigue.    MEDICAL HISTORY:  Past Medical History:  Diagnosis Date  . Allergic rhinitis   . CAD (coronary artery disease)   . Carotid artery occlusion 02/15/2009  . CVA (cerebral infarction)   . Dementia (HCC)   . Diabetes mellitus   . Gastritis   . GERD (gastroesophageal reflux disease)   . Heart murmur   . Hematemesis   . Hemiplegia (HCC)   . HLD (hyperlipidemia)   . Hypertension   . Hypertensive chronic kidney disease   . Macular degeneration   . Persistent mood (affective) disorder, unspecified (HCC)   . Stroke (HCC)   . Thyroid disease    hypothyroidism  . Ulcer of esophagus without bleeding     SURGICAL HISTORY: Past Surgical History:  Procedure Laterality Date  . APPENDECTOMY    . ESOPHAGOGASTRODUODENOSCOPY Left 10/17/2015   Procedure: ESOPHAGOGASTRODUODENOSCOPY (EGD);  Surgeon: Scot Junobert T Elliott, MD;  [facility] ENDOSCOPY;  Service: Endoscopy;  Laterality: Left;  . ESOPHAGOGASTRODUODENOSCOPY N/A 08/22/2018   Procedure: ESOPHAGOGASTRODUODENOSCOPY (EGD);  Surgeon: Toney ReilVanga, Rohini Reddy, MD;  [facility]COPY;  Service: Gastroenterology;  Laterality: N/A;  . TONSILLECTOMY      SOCIAL HISTORY: Social History   Socioeconomic History  . Marital status: Divorced    Spouse name: Not on file  . Number of children: Not on file  . Years of education: Not on file  . Highest education level: Not on file  Occupational History  . Occupation: retired  Engineer, productionocial Needs  . Financial resource strain: Not  on file  . Food insecurity:    Worry: Not on file    Inability: Not on file  . Transportation needs:    Medical: Not on file    Non-medical: Not on file  Tobacco Use  . Smoking status: Former Smoker    Types: Cigarettes    Last attempt to quit: 07/20/1985    Years since quitting: 33.4  . Smokeless tobacco: Never Used  Substance and Sexual Activity  . Alcohol  use: No  . Drug use: No  . Sexual activity: Never    Birth control/protection: Post-menopausal  Lifestyle  . Physical activity:    Days per week: Not on file    Minutes per session: Not on file  . Stress: Not on file  Relationships  . Social connections:    Talks on phone: Not on file    Gets together: Not on file    Attends religious service: Not on file    Active member of club or organization: Not on file    Attends meetings of clubs or organizations: Not on file    Relationship status: Not on file  . Intimate partner violence:    Fear of current or ex partner: Not on file    Emotionally abused: Not on file    Physically abused: Not on file    Forced sexual activity: Not on file  Other Topics Concern  . Not on file  Social History Narrative  . Not on file    FAMILY HISTORY: Family History  Problem Relation Age of Onset  . Diabetes Mother   . Hypertension Mother   . Diabetes Father   . Hypertension Father   . Diabetes Sister   . Hypertension Sister     ALLERGIES:  is allergic to fexofenadine hcl.  MEDICATIONS:  Current Outpatient Medications  Medication Sig Dispense Refill  . acetaminophen (TYLENOL) 325 MG tablet Take 650 mg by mouth every 4 (four) hours as needed.    Marland Kitchen. aluminum hydroxide (DERMAGRAN) ointment Apply topically every 12 (twelve) hours as needed. Skin protectant to buttocks    . aspirin 81 MG chewable tablet Chew 1 tablet (81 mg total) by mouth daily. 30 tablet 0  . Cholecalciferol (VITAMIN D3) 50000 units CAPS Take 1 capsule by mouth every 30 (thirty) days.     . citalopram (CELEXA) 10 MG tablet Take 5 mg by mouth daily.     Marland Kitchen. gabapentin (NEURONTIN) 300 MG capsule Take 300 mg by mouth every evening.     Marland Kitchen. guaiFENesin (ROBITUSSIN) 100 MG/5ML liquid Take 200 mg by mouth every 4 (four) hours as needed for cough.    . hydroxypropyl methylcellulose / hypromellose (ISOPTO TEARS / GONIOVISC) 2.5 % ophthalmic solution Place 1 drop into both eyes 3 (three)  times daily as needed for dry eyes.    . insulin glargine (LANTUS) 100 UNIT/ML injection Inject 0.1 mLs (10 Units total) into the skin daily. (Patient taking differently: Inject 14 Units into the skin at bedtime. ) 10 mL 11  . levothyroxine (SYNTHROID, LEVOTHROID) 88 MCG tablet Take 88 mcg by mouth every evening.     . lovastatin (MEVACOR) 20 MG tablet Take 1 tablet (20 mg total) by mouth every evening. 30 tablet 0  . memantine (NAMENDA) 10 MG tablet Take 10 mg by mouth 2 (two) times daily.    . Menthol (RICOLA CHERRY HONEY HERB) 2 MG LOZG Use as directed 1 lozenge in the mouth or throat every 2 (two) hours as needed (FOR  COUGH).    . mirtazapine (REMERON) 15 MG tablet Take 15 mg by mouth at bedtime.    Marland Kitchen. omeprazole (PRILOSEC) 20 MG capsule Take 20 mg by mouth 2 (two) times daily.    . ondansetron (ZOFRAN) 4 MG tablet Take 4 mg by mouth daily as needed for nausea or vomiting.     . polyethylene glycol (MIRALAX / GLYCOLAX) packet Take 17 g by mouth daily.    Marland Kitchen. senna-docusate (SENOKOT-S) 8.6-50 MG tablet Take 2 tablets by mouth daily.     No current facility-administered medications for this visit.      PHYSICAL EXAMINATION: ECOG PERFORMANCE STATUS: 3 - Symptomatic, >50% confined to bed Vitals:   12/28/18 1043  BP: (!) 142/83  Pulse: 96  Temp: (!) 96.6 F (35.9 C)   Filed Weights   12/28/18 1043  Weight: 138 lb 6 oz (62.8 kg)    Physical Exam Constitutional:      General: She is not in acute distress.    Appearance: She is ill-appearing.     Comments: Drowsy  HENT:     Head: Normocephalic and atraumatic.  Eyes:     General: No scleral icterus.    Pupils: Pupils are equal, round, and reactive to light.  Neck:     Musculoskeletal: Normal range of motion and neck supple.  Cardiovascular:     Rate and Rhythm: Normal rate and regular rhythm.     Heart sounds: Normal heart sounds.  Pulmonary:     Effort: Pulmonary effort is normal. No respiratory distress.     Breath sounds:  Normal breath sounds. No wheezing.  Abdominal:     General: Bowel sounds are normal. There is no distension.     Palpations: Abdomen is soft. There is no mass.     Tenderness: There is no abdominal tenderness.  Musculoskeletal: Normal range of motion.        General: No deformity.  Skin:    General: Skin is warm and dry.     Findings: No erythema or rash.  Neurological:     Mental Status: She is alert and oriented to person, place, and time.     Cranial Nerves: No cranial nerve deficit.     Coordination: Coordination normal.     Comments: Pleasantly confused. Not following commands.  Psychiatric:        Behavior: Behavior normal.        Thought Content: Thought content normal.     RADIOGRAPHIC STUDIES: I have personally reviewed the radiological images as listed and agreed with the findings in the report.  CMP Latest Ref Rng & Units 10/31/2018  Glucose 70 - 99 mg/dL 161(W169(H)  BUN 8 - 23 mg/dL 6(L)  Creatinine 9.600.44 - 1.00 mg/dL 4.54(U1.06(H)  Sodium 981135 - 191145 mmol/L 139  Potassium 3.5 - 5.1 mmol/L 4.2  Chloride 98 - 111 mmol/L 112(H)  CO2 22 - 32 mmol/L 23  Calcium 8.9 - 10.3 mg/dL 4.7(W8.4(L)  Total Protein 6.5 - 8.1 g/dL -  Total Bilirubin 0.3 - 1.2 mg/dL -  Alkaline Phos 38 - 295126 U/L -  AST 15 - 41 U/L -  ALT 0 - 44 U/L -   CBC Latest Ref Rng & Units 12/28/2018  WBC 4.0 - 10.5 K/uL 9.2  Hemoglobin 12.0 - 15.0 g/dL 62.113.1  Hematocrit 30.836.0 - 46.0 % 42.2  Platelets 150 - 400 K/uL 201    LABORATORY DATA:  I have reviewed the data as listed Lab Results  Component Value Date  WBC 9.2 12/28/2018   HGB 13.1 12/28/2018   HCT 42.2 12/28/2018   MCV 87.6 12/28/2018   PLT 201 12/28/2018   Recent Labs    03/19/18 0633  08/21/18 1251  10/27/18 1023  10/29/18 0533  10/30/18 0358 10/30/18 1840 10/31/18 0453  NA 144   < > 140   < > 144   < > 144  --  140  --  139  K 4.0   < > 4.0   < > 3.5   < > 2.6*   < > 3.2* 4.1 4.2  CL 111   < > 104   < > 98   < > 114*  --  110  --  112*  CO2  29   < > 30   < > 27   < > 22  --  21*  --  23  GLUCOSE 182*   < > 208*   < > 218*   < > 121*  --  258*  --  169*  BUN 35*   < > 15   < > 42*   < > 14  --  7*  --  6*  CREATININE 2.03*   < > 1.07*   < > 3.77*   < > 1.22*  --  0.97  --  1.06*  CALCIUM 8.7*   < > 8.7*   < > 9.8   < > 7.6*  --  7.6*  --  8.4*  GFRNONAA 22*   < > 50*   < > 11*   < > 42*  --  56*  --  50*  GFRAA 26*   < > 58*   < > 13*   < > 49*  --  >60  --  58*  PROT 6.1*  --  6.6  --  7.6  --   --   --   --   --   --   ALBUMIN 3.0*  --  3.3*  --  3.9  --   --   --   --   --   --   AST 21  --  23  --  26  --   --   --   --   --   --   ALT 12  --  12  --  17  --   --   --   --   --   --   ALKPHOS 81  --  92  --  87  --   --   --   --   --   --   BILITOT 0.5  --  0.5  --  0.7  --   --   --   --   --   --    < > = values in this interval not displayed.   Iron/TIBC/Ferritin/ %Sat    Component Value Date/Time   IRON 92 12/28/2018 0926   TIBC 266 12/28/2018 0926   FERRITIN 325 (H) 12/28/2018 0926   IRONPCTSAT 35 (H) 12/28/2018 0926        ASSESSMENT & PLAN:  1. Iron deficiency anemia due to chronic blood loss   labs are reviewed.  Anemia has resolved.  Hold additional IV iron.  Iron panel resulted after patient left clinic.  Ferritin 325, iron saturation.  Advise RN/CMA to call nursing home facility and advise patient to stop oral iron supplementation.  Follow up in clinic in one year.  Orders Placed This Encounter  Procedures  . CBC with Differential/Platelet    Standing Status:   Future    Standing Expiration Date:   12/28/2019  . Iron and TIBC    Standing Status:   Future    Standing Expiration Date:   12/28/2019  . Ferritin    Standing Status:   Future    Standing Expiration Date:   12/28/2019    All questions were answered. The patient knows to call the clinic with any problems questions or concerns. We spent sufficient time to discuss many aspect of care, questions were answered to patient's satisfaction.  Total face to face encounter time for this patient visit was 15 min. >50% of the time was  spent in counseling and coordination of care.    Earlie Server, MD, PhD Hematology Oncology [facility] Pager- [phone] 12/28/2018

## 2018-12-28 NOTE — Progress Notes (Signed)
Patient here today for follow up.   

## 2019-01-12 DIAGNOSIS — E1142 Type 2 diabetes mellitus with diabetic polyneuropathy: Secondary | ICD-10-CM | POA: Diagnosis not present

## 2019-01-12 DIAGNOSIS — F0150 Vascular dementia without behavioral disturbance: Secondary | ICD-10-CM

## 2019-01-12 DIAGNOSIS — G8191 Hemiplegia, unspecified affecting right dominant side: Secondary | ICD-10-CM

## 2019-01-12 DIAGNOSIS — F39 Unspecified mood [affective] disorder: Secondary | ICD-10-CM

## 2019-03-15 DIAGNOSIS — E119 Type 2 diabetes mellitus without complications: Secondary | ICD-10-CM

## 2019-03-15 DIAGNOSIS — I69351 Hemiplegia and hemiparesis following cerebral infarction affecting right dominant side: Secondary | ICD-10-CM

## 2019-03-15 DIAGNOSIS — F39 Unspecified mood [affective] disorder: Secondary | ICD-10-CM

## 2019-03-15 DIAGNOSIS — K219 Gastro-esophageal reflux disease without esophagitis: Secondary | ICD-10-CM | POA: Diagnosis not present

## 2019-03-15 DIAGNOSIS — E039 Hypothyroidism, unspecified: Secondary | ICD-10-CM

## 2019-03-15 DIAGNOSIS — F0150 Vascular dementia without behavioral disturbance: Secondary | ICD-10-CM

## 2019-04-21 IMAGING — US US EXTREM LOW VENOUS*R*
1 series · 13 of 24 positions shown · non-contrast
Comparison: None.

CLINICAL DATA: Right lower extremity pain and edema. Evaluate for
DVT.



[Series 1: us extrem low venous*right* · 13 of 47 slices shown]
[im 1/47]
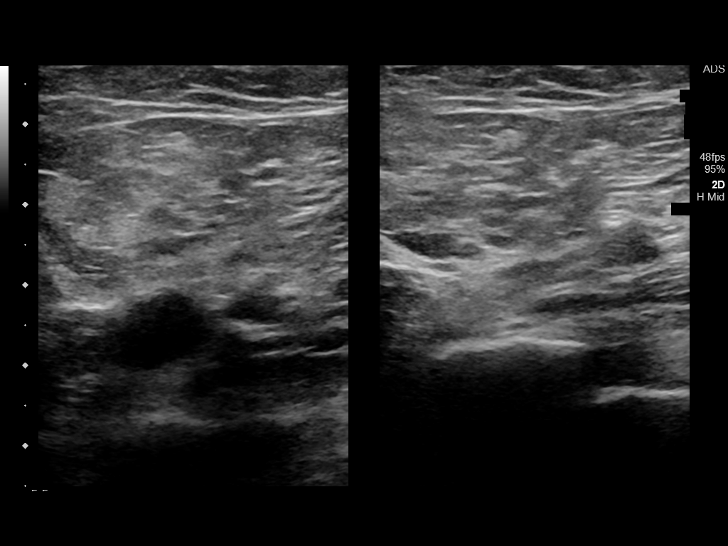
[im 5/47]
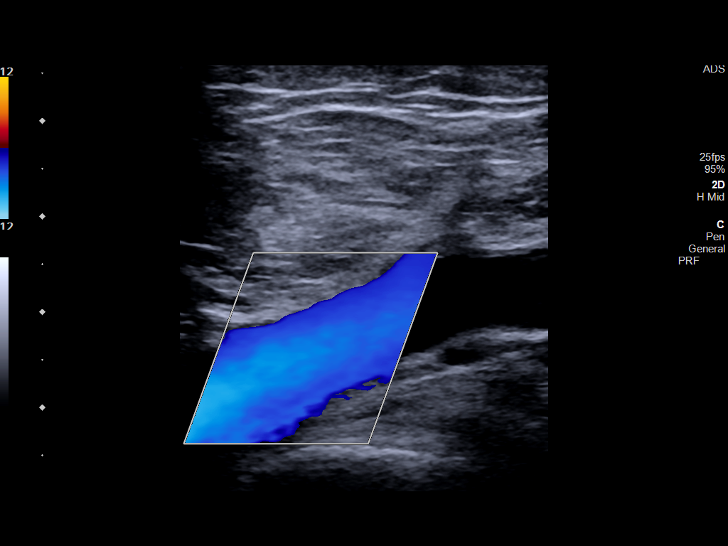
[im 9/47]
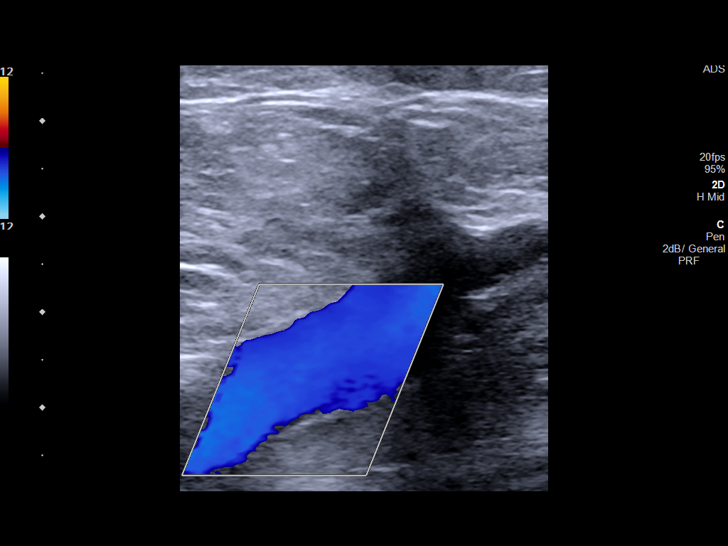
[im 13/47]
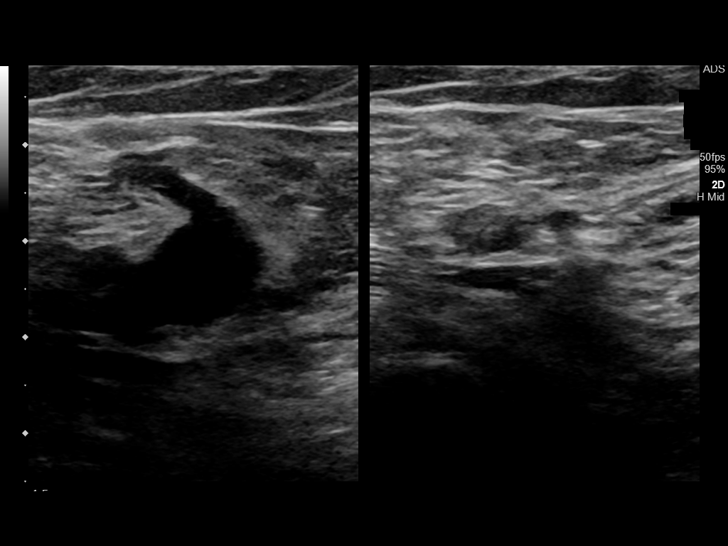
[im 17/47]
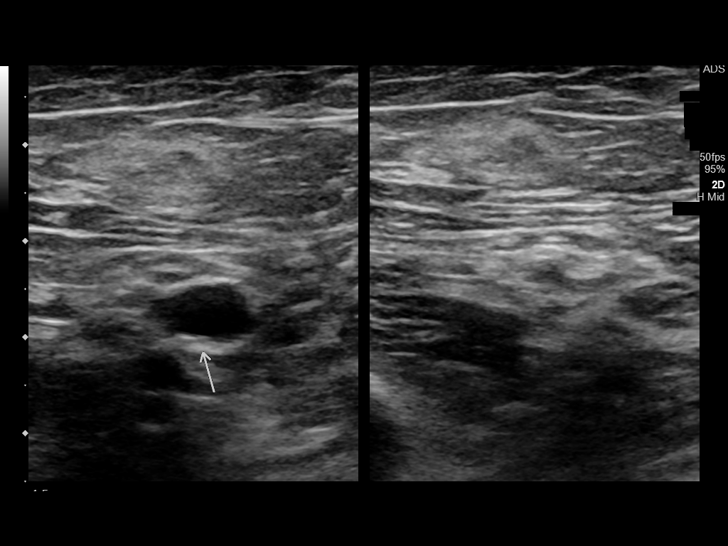
[im 21/47]
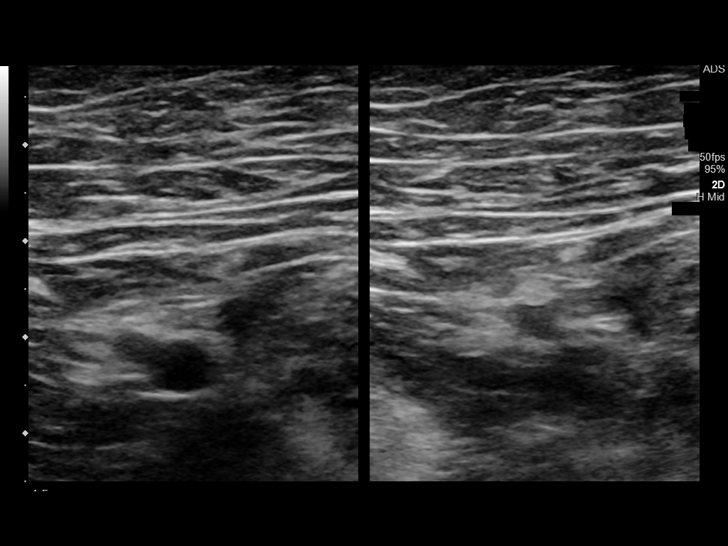
[im 25/47]
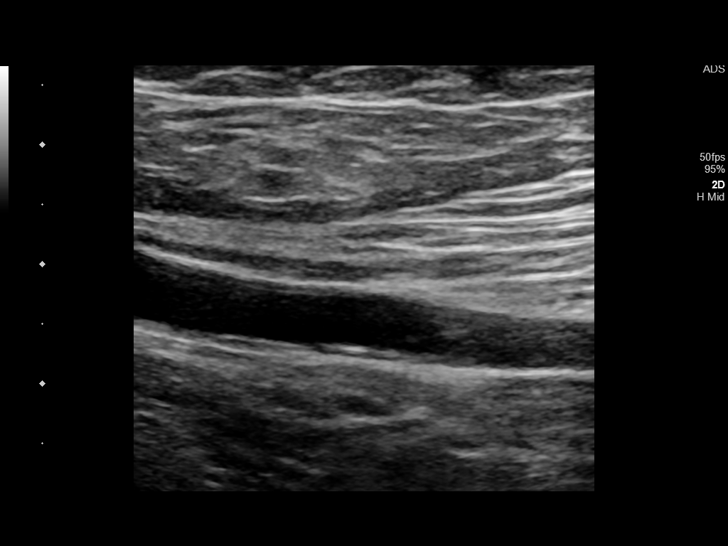
[im 27/47]
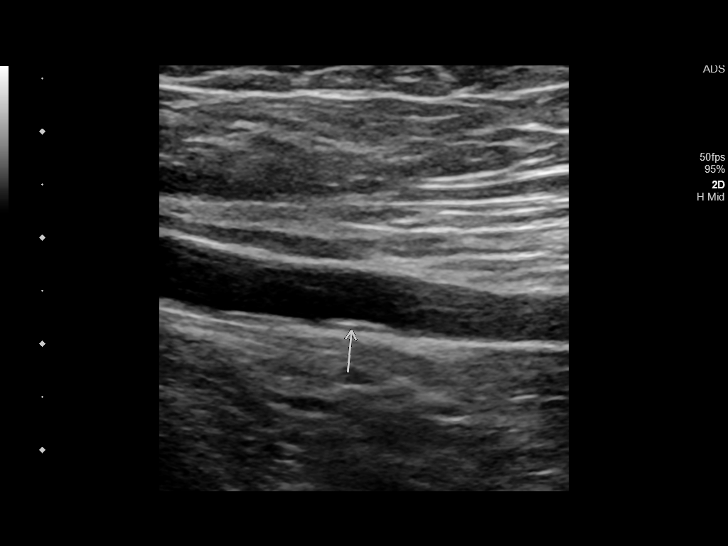
[im 31/47]
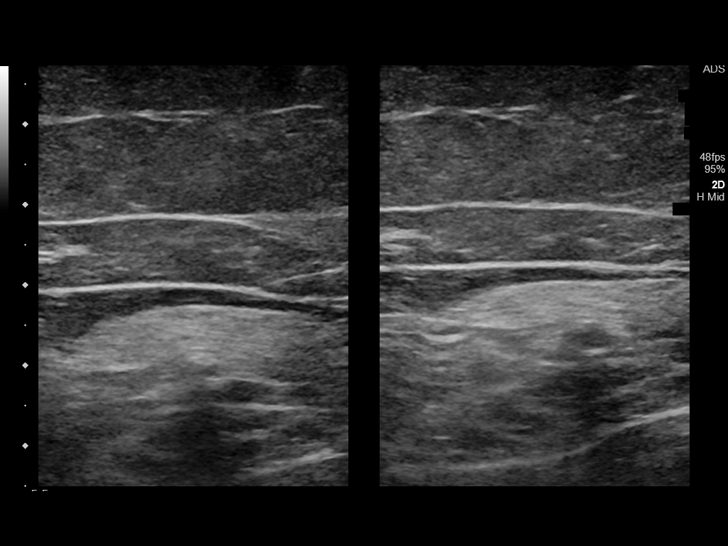
[im 35/47]
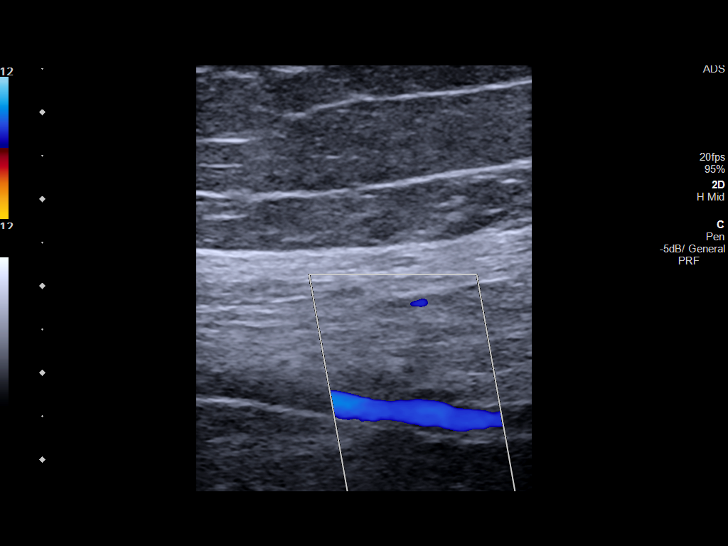
[im 39/47]
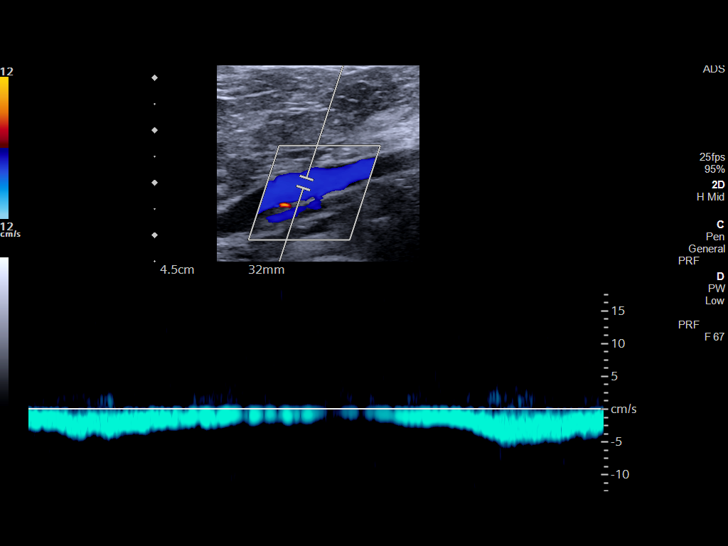
[im 43/47]
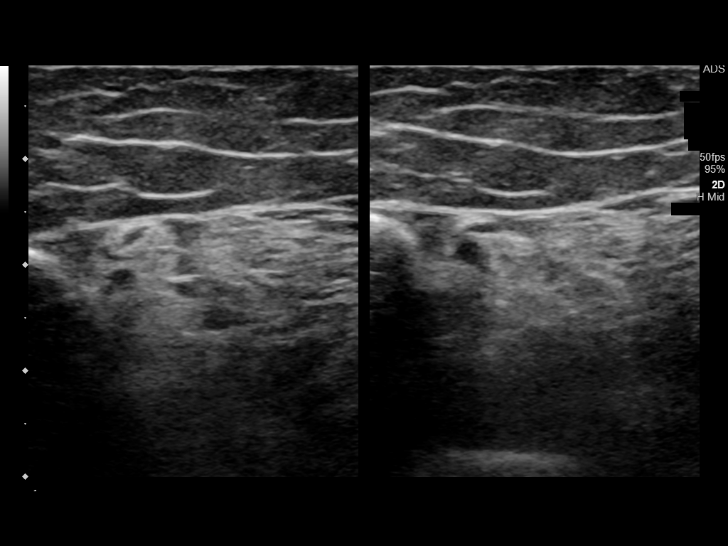
[im 47/47]
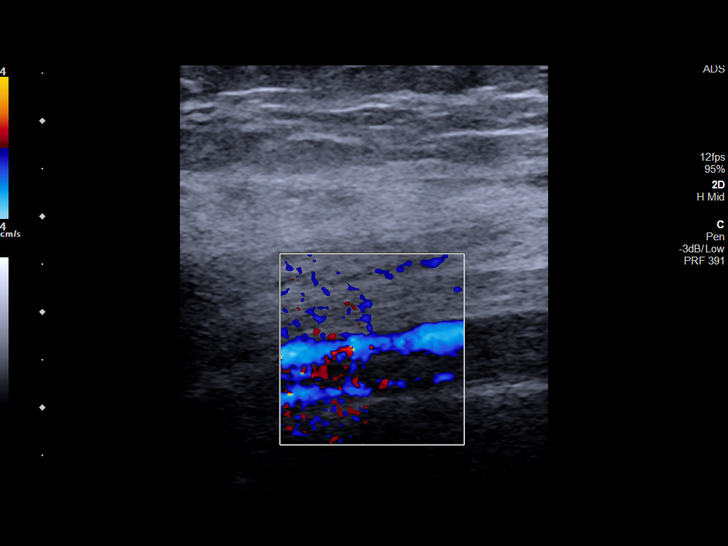

[13 of 24 positions shown; findings below may reference images not displayed]

FINDINGS: Contralateral Common Femoral Vein: Respiratory phasicity is normal
and symmetric with the symptomatic side. No evidence of thrombus.
Normal compressibility.

Common Femoral Vein: No evidence of thrombus. Normal
compressibility, respiratory phasicity and response to augmentation.

Saphenofemoral Junction: No evidence of thrombus. Normal
compressibility and flow on color Doppler imaging.

Profunda Femoral Vein: No evidence of thrombus. Normal
compressibility and flow on color Doppler imaging.

Femoral Vein: There is a very minimal amount nonocclusive echogenic
wall thickening/mural calcification involving the proximal aspect of
the right superficial femoral vein. No evidence of thrombus. Normal
compressibility, respiratory phasicity and response to augmentation.

Popliteal Vein: No evidence of thrombus. Normal compressibility,
respiratory phasicity and response to augmentation.

Calf Veins: No evidence of thrombus. Normal compressibility and flow
on color Doppler imaging.

Superficial Great Saphenous Vein: No evidence of thrombus. Normal
compressibility.

Venous Reflux:  None.

Other Findings:  None.
IMPRESSION: 1. No evidence of acute DVT within the right lower extremity.
2. Minimal amount of nonocclusive echogenic wall thickening/mural
calcification involving the proximal aspect of the right superficial
femoral vein, nonspecific though potentially the sequela of prior
DVT.

## 2019-05-11 DIAGNOSIS — F39 Unspecified mood [affective] disorder: Secondary | ICD-10-CM | POA: Diagnosis not present

## 2019-05-11 DIAGNOSIS — E1142 Type 2 diabetes mellitus with diabetic polyneuropathy: Secondary | ICD-10-CM

## 2019-05-11 DIAGNOSIS — G8191 Hemiplegia, unspecified affecting right dominant side: Secondary | ICD-10-CM | POA: Diagnosis not present

## 2019-05-11 DIAGNOSIS — F0150 Vascular dementia without behavioral disturbance: Secondary | ICD-10-CM

## 2019-05-11 DIAGNOSIS — K219 Gastro-esophageal reflux disease without esophagitis: Secondary | ICD-10-CM

## 2019-05-26 ENCOUNTER — Other Ambulatory Visit: Payer: Self-pay

## 2019-05-26 ENCOUNTER — Emergency Department
Admission: EM | Admit: 2019-05-26 | Discharge: 2019-05-26 | Disposition: A | Discharge status: Other Acute Inpatient Hospital | Payer: Medicare Other | Attending: Emergency Medicine | Admitting: Emergency Medicine

## 2019-05-26 ENCOUNTER — Inpatient Hospital Stay (HOSPITAL_COMMUNITY)
Admission: AD | Admit: 2019-05-26 | Discharge: 2019-06-02 | DRG: 689 | Disposition: A | Discharge status: Skilled Nursing Facility | Payer: Medicare Other | Source: Other Acute Inpatient Hospital | Attending: Family Medicine | Admitting: Family Medicine

## 2019-05-26 ENCOUNTER — Emergency Department: Payer: Medicare Other

## 2019-05-26 DIAGNOSIS — J1289 Other viral pneumonia: Secondary | ICD-10-CM | POA: Diagnosis present

## 2019-05-26 DIAGNOSIS — E876 Hypokalemia: Secondary | ICD-10-CM | POA: Diagnosis not present

## 2019-05-26 DIAGNOSIS — E785 Hyperlipidemia, unspecified: Secondary | ICD-10-CM | POA: Diagnosis present

## 2019-05-26 DIAGNOSIS — U071 COVID-19: Secondary | ICD-10-CM | POA: Diagnosis present

## 2019-05-26 DIAGNOSIS — N3000 Acute cystitis without hematuria: Secondary | ICD-10-CM | POA: Insufficient documentation

## 2019-05-26 DIAGNOSIS — Z7401 Bed confinement status: Secondary | ICD-10-CM | POA: Diagnosis not present

## 2019-05-26 DIAGNOSIS — I2510 Atherosclerotic heart disease of native coronary artery without angina pectoris: Secondary | ICD-10-CM | POA: Diagnosis present

## 2019-05-26 DIAGNOSIS — G309 Alzheimer's disease, unspecified: Secondary | ICD-10-CM | POA: Diagnosis present

## 2019-05-26 DIAGNOSIS — G9341 Metabolic encephalopathy: Secondary | ICD-10-CM | POA: Diagnosis present

## 2019-05-26 DIAGNOSIS — I129 Hypertensive chronic kidney disease with stage 1 through stage 4 chronic kidney disease, or unspecified chronic kidney disease: Secondary | ICD-10-CM | POA: Diagnosis present

## 2019-05-26 DIAGNOSIS — N390 Urinary tract infection, site not specified: Secondary | ICD-10-CM | POA: Diagnosis present

## 2019-05-26 DIAGNOSIS — R410 Disorientation, unspecified: Secondary | ICD-10-CM | POA: Diagnosis not present

## 2019-05-26 DIAGNOSIS — F0280 Dementia in other diseases classified elsewhere without behavioral disturbance: Secondary | ICD-10-CM | POA: Diagnosis not present

## 2019-05-26 DIAGNOSIS — I69359 Hemiplegia and hemiparesis following cerebral infarction affecting unspecified side: Secondary | ICD-10-CM | POA: Diagnosis not present

## 2019-05-26 DIAGNOSIS — Z794 Long term (current) use of insulin: Secondary | ICD-10-CM | POA: Insufficient documentation

## 2019-05-26 DIAGNOSIS — E870 Hyperosmolality and hypernatremia: Secondary | ICD-10-CM | POA: Diagnosis present

## 2019-05-26 DIAGNOSIS — Z833 Family history of diabetes mellitus: Secondary | ICD-10-CM

## 2019-05-26 DIAGNOSIS — K920 Hematemesis: Secondary | ICD-10-CM | POA: Diagnosis present

## 2019-05-26 DIAGNOSIS — Z7982 Long term (current) use of aspirin: Secondary | ICD-10-CM | POA: Insufficient documentation

## 2019-05-26 DIAGNOSIS — R509 Fever, unspecified: Secondary | ICD-10-CM | POA: Diagnosis present

## 2019-05-26 DIAGNOSIS — Z79899 Other long term (current) drug therapy: Secondary | ICD-10-CM | POA: Diagnosis not present

## 2019-05-26 DIAGNOSIS — E039 Hypothyroidism, unspecified: Secondary | ICD-10-CM | POA: Diagnosis present

## 2019-05-26 DIAGNOSIS — Z8673 Personal history of transient ischemic attack (TIA), and cerebral infarction without residual deficits: Secondary | ICD-10-CM | POA: Diagnosis not present

## 2019-05-26 DIAGNOSIS — Z888 Allergy status to other drugs, medicaments and biological substances status: Secondary | ICD-10-CM

## 2019-05-26 DIAGNOSIS — E872 Acidosis: Secondary | ICD-10-CM | POA: Diagnosis present

## 2019-05-26 DIAGNOSIS — E119 Type 2 diabetes mellitus without complications: Secondary | ICD-10-CM

## 2019-05-26 DIAGNOSIS — F0390 Unspecified dementia without behavioral disturbance: Secondary | ICD-10-CM | POA: Insufficient documentation

## 2019-05-26 DIAGNOSIS — Z87891 Personal history of nicotine dependence: Secondary | ICD-10-CM | POA: Diagnosis not present

## 2019-05-26 DIAGNOSIS — E1122 Type 2 diabetes mellitus with diabetic chronic kidney disease: Secondary | ICD-10-CM | POA: Diagnosis present

## 2019-05-26 DIAGNOSIS — H3530 Unspecified macular degeneration: Secondary | ICD-10-CM | POA: Diagnosis present

## 2019-05-26 DIAGNOSIS — N1831 Chronic kidney disease, stage 3a: Secondary | ICD-10-CM | POA: Diagnosis present

## 2019-05-26 DIAGNOSIS — N179 Acute kidney failure, unspecified: Secondary | ICD-10-CM | POA: Diagnosis present

## 2019-05-26 DIAGNOSIS — K219 Gastro-esophageal reflux disease without esophagitis: Secondary | ICD-10-CM | POA: Diagnosis present

## 2019-05-26 DIAGNOSIS — N189 Chronic kidney disease, unspecified: Secondary | ICD-10-CM | POA: Diagnosis present

## 2019-05-26 DIAGNOSIS — R0602 Shortness of breath: Secondary | ICD-10-CM

## 2019-05-26 DIAGNOSIS — J1282 Pneumonia due to coronavirus disease 2019: Secondary | ICD-10-CM | POA: Diagnosis present

## 2019-05-26 DIAGNOSIS — E11649 Type 2 diabetes mellitus with hypoglycemia without coma: Secondary | ICD-10-CM | POA: Diagnosis not present

## 2019-05-26 DIAGNOSIS — Z8249 Family history of ischemic heart disease and other diseases of the circulatory system: Secondary | ICD-10-CM | POA: Diagnosis not present

## 2019-05-26 DIAGNOSIS — R112 Nausea with vomiting, unspecified: Secondary | ICD-10-CM | POA: Diagnosis present

## 2019-05-26 LAB — COMPREHENSIVE METABOLIC PANEL
ALT: 21 U/L (ref 0–44)
AST: 34 U/L (ref 15–41)
Albumin: 3.1 g/dL — ABNORMAL LOW (ref 3.5–5.0)
Alkaline Phosphatase: 72 U/L (ref 38–126)
Anion gap: 15 (ref 5–15)
BUN: 54 mg/dL — ABNORMAL HIGH (ref 8–23)
CO2: 24 mmol/L (ref 22–32)
Calcium: 8.9 mg/dL (ref 8.9–10.3)
Chloride: 106 mmol/L (ref 98–111)
Creatinine, Ser: 1.68 mg/dL — ABNORMAL HIGH (ref 0.44–1.00)
GFR calc Af Amer: 33 mL/min — ABNORMAL LOW (ref >60)
GFR calc non Af Amer: 29 mL/min — ABNORMAL LOW (ref >60)
Glucose, Bld: 300 mg/dL — ABNORMAL HIGH (ref 70–99)
Potassium: 3.6 mmol/L (ref 3.5–5.1)
Sodium: 145 mmol/L (ref 135–145)
Total Bilirubin: 0.9 mg/dL (ref 0.3–1.2)
Total Protein: 7.4 g/dL (ref 6.5–8.1)

## 2019-05-26 LAB — LACTIC ACID, PLASMA
Lactic Acid, Venous: 2.9 mmol/L (ref 0.5–1.9)
Lactic Acid, Venous: 1.5 mmol/L (ref 0.5–1.9)

## 2019-05-26 LAB — SEDIMENTATION RATE: Sed Rate: 47 mm/hr — ABNORMAL HIGH (ref 0–30)

## 2019-05-26 LAB — CBC WITH DIFFERENTIAL/PLATELET
Abs Immature Granulocytes: 0.07 10*3/uL (ref 0.00–0.07)
Basophils Absolute: 0 10*3/uL (ref 0.0–0.1)
Basophils Relative: 1 %
Eosinophils Absolute: 0 10*3/uL (ref 0.0–0.5)
Eosinophils Relative: 0 %
HCT: 44.2 % (ref 36.0–46.0)
Hemoglobin: 14.6 g/dL (ref 12.0–15.0)
Immature Granulocytes: 1 %
Lymphocytes Relative: 13 %
Lymphs Abs: 1 10*3/uL (ref 0.7–4.0)
MCH: 27.6 pg (ref 26.0–34.0)
MCHC: 33 g/dL (ref 30.0–36.0)
MCV: 83.6 fL (ref 80.0–100.0)
Monocytes Absolute: 0.5 10*3/uL (ref 0.1–1.0)
Monocytes Relative: 7 %
Neutro Abs: 6 10*3/uL (ref 1.7–7.7)
Neutrophils Relative %: 78 %
Platelets: 261 10*3/uL (ref 150–400)
RBC: 5.29 MIL/uL — ABNORMAL HIGH (ref 3.87–5.11)
RDW: 13.5 % (ref 11.5–15.5)
Smear Review: NORMAL
WBC: 7.6 10*3/uL (ref 4.0–10.5)
nRBC: 0 % (ref 0.0–0.2)

## 2019-05-26 LAB — URINALYSIS, COMPLETE (UACMP) WITH MICROSCOPIC
Bilirubin Urine: NEGATIVE
Glucose, UA: 150 mg/dL — AB
Ketones, ur: 5 mg/dL — AB
Nitrite: NEGATIVE
Protein, ur: 30 mg/dL — AB
Specific Gravity, Urine: 1.019 (ref 1.005–1.030)
pH: 5 (ref 5.0–8.0)

## 2019-05-26 LAB — FIBRINOGEN: Fibrinogen: >750 mg/dL — ABNORMAL HIGH (ref 210–475)

## 2019-05-26 LAB — PROCALCITONIN: Procalcitonin: <0.1 ng/mL

## 2019-05-26 LAB — SARS CORONAVIRUS 2 (TAT 6-24 HRS): SARS Coronavirus 2: POSITIVE — AB

## 2019-05-26 LAB — FIBRIN DERIVATIVES D-DIMER (ARMC ONLY): Fibrin derivatives D-dimer (ARMC): 3646.57 ng/mL (FEU) — ABNORMAL HIGH (ref 0.00–499.00)

## 2019-05-26 LAB — C-REACTIVE PROTEIN: CRP: 17.4 mg/dL — ABNORMAL HIGH (ref <1.0)

## 2019-05-26 MED ORDER — SODIUM CHLORIDE 0.9 % IV SOLN
1.0000 g | INTRAVENOUS | Status: DC
  Administered 2019-05-27 – 2019-05-28 (×2): 1 g via INTRAVENOUS
  Filled 2019-05-26 (×2): qty 10

## 2019-05-26 MED ORDER — ZINC SULFATE 220 (50 ZN) MG PO CAPS
220.0000 mg | ORAL_CAPSULE | Freq: Every day | ORAL | Status: DC
  Administered 2019-05-31: 220 mg via ORAL
  Filled 2019-05-26 (×4): qty 1

## 2019-05-26 MED ORDER — ACETAMINOPHEN 325 MG PO TABS: 650.0000 mg | ORAL_TABLET | Freq: Four times a day (QID) | ORAL | Status: DC | PRN

## 2019-05-26 MED ORDER — SODIUM CHLORIDE 0.9 % IV SOLN
80.0000 mg | Freq: Once | INTRAVENOUS | Status: AC
  Administered 2019-05-26: 15:00:00 80 mg via INTRAVENOUS
  Filled 2019-05-26: qty 80

## 2019-05-26 MED ORDER — PANTOPRAZOLE SODIUM 40 MG IV SOLR: 40.0000 mg | Freq: Two times a day (BID) | INTRAVENOUS | Status: DC

## 2019-05-26 MED ORDER — SODIUM CHLORIDE 0.9 % IV SOLN: 250.0000 mL | INTRAVENOUS | Status: DC | PRN

## 2019-05-26 MED ORDER — PANTOPRAZOLE SODIUM 40 MG IV SOLR
40.0000 mg | Freq: Two times a day (BID) | INTRAVENOUS | Status: DC
  Administered 2019-05-27 – 2019-05-28 (×3): 40 mg via INTRAVENOUS
  Filled 2019-05-26 (×3): qty 40

## 2019-05-26 MED ORDER — VITAMIN C 500 MG PO TABS
500.0000 mg | ORAL_TABLET | Freq: Every day | ORAL | Status: DC
  Administered 2019-05-31: 500 mg via ORAL
  Filled 2019-05-26 (×4): qty 1

## 2019-05-26 MED ORDER — SODIUM CHLORIDE 0.9% FLUSH
3.0000 mL | Freq: Two times a day (BID) | INTRAVENOUS | Status: DC
  Administered 2019-05-26 – 2019-06-02 (×12): 3 mL via INTRAVENOUS

## 2019-05-26 MED ORDER — INSULIN ASPART 100 UNIT/ML ~~LOC~~ SOLN: 0.0000 [IU] | Freq: Every day | SUBCUTANEOUS | Status: DC

## 2019-05-26 MED ORDER — INSULIN ASPART 100 UNIT/ML ~~LOC~~ SOLN: 0.0000 [IU] | Freq: Three times a day (TID) | SUBCUTANEOUS | Status: DC

## 2019-05-26 MED ORDER — SODIUM CHLORIDE 0.9 % IV SOLN
1.0000 g | Freq: Once | INTRAVENOUS | Status: AC
  Administered 2019-05-26: 13:00:00 1 g via INTRAVENOUS
  Filled 2019-05-26: qty 10

## 2019-05-26 MED ORDER — GUAIFENESIN-DM 100-10 MG/5ML PO SYRP: 10.0000 mL | ORAL_SOLUTION | ORAL | Status: DC | PRN

## 2019-05-26 MED ORDER — SODIUM CHLORIDE 0.9% FLUSH
3.0000 mL | INTRAVENOUS | Status: DC | PRN
  Administered 2019-06-01: 3 mL via INTRAVENOUS
  Filled 2019-05-26: qty 3

## 2019-05-26 MED ORDER — SODIUM CHLORIDE 0.9 % IV SOLN
8.0000 mg/h | INTRAVENOUS | Status: DC
  Administered 2019-05-26: 8 mg/h via INTRAVENOUS
  Filled 2019-05-26: qty 80

## 2019-05-26 MED ORDER — HYDROCOD POLST-CPM POLST ER 10-8 MG/5ML PO SUER: 5.0000 mL | Freq: Two times a day (BID) | ORAL | Status: DC | PRN

## 2019-05-26 MED ORDER — ONDANSETRON HCL 4 MG/2ML IJ SOLN: 4.0000 mg | Freq: Four times a day (QID) | INTRAMUSCULAR | Status: DC | PRN

## 2019-05-26 MED ORDER — DEXAMETHASONE SODIUM PHOSPHATE 10 MG/ML IJ SOLN
6.0000 mg | INTRAMUSCULAR | Status: DC
  Administered 2019-05-26: 6 mg via INTRAVENOUS
  Filled 2019-05-26: qty 1

## 2019-05-26 MED ORDER — ONDANSETRON HCL 4 MG PO TABS: 4.0000 mg | ORAL_TABLET | Freq: Four times a day (QID) | ORAL | Status: DC | PRN

## 2019-05-26 NOTE — Progress Notes (Signed)
[facility] to [facility] transfer:  Patient with h/o hypothyroidism; CVA with hemiplegia; macular degeneration; HTN; HLD; DM; and dementia presenting to [facility] today with UGI bleeding; COVID; and UTI.  She was started on Protonix IV.  Tested positive for COVID on 10/28 at [facility].  2 days of weakness, poor PO intake, less responsive. Today, 200-300 cc emesis that looked like coffee grounds.  Started on Protonix drip and infusion.  UA looks infected. Lactate 2.9.  [place] is concerned about possible hematemesis.  He talked to the niece, who is a Marine scientist at [employer] - they would want to go through evaluation if GI bleeding.  However, the patient has not had a hemoccult and so I have asked that this be done so that appropriate placement can be done.  If heme negative, the patient can go to [facility].  If positive, will instead admit at [facility].  Carlyon Shadow, M.D.

## 2019-05-26 NOTE — ED Notes (Signed)
Daughter updated on pt's status. 

## 2019-05-26 NOTE — ED Provider Notes (Signed)
[facility] Emergency Department Provider Note  Time seen: 11:01 AM  I have reviewed the triage vital signs and the nursing notes.   HISTORY  Chief Complaint Covid-19 and GI Bleeding   HPI [name] [name] is a 78 y.o. female with a past medical history of CAD, dementia, diabetes, hypertension, hyperlipidemia, CVA, presents to the emergency department for possible GI bleed generalized weakness decreased appetite fevers, urinary tract infection Covid positive.  According to EMS report patient currently lives at a nursing facility with a Covid outbreak.  Patient has tested positive for coronavirus.  Patient has dementia and cannot contribute to her history.  Per EMS report staff noted the patient had an episode of coffee-ground emesis this morning with a history of GI bleed in the past.  Patient is also currently on antibiotics for a urinary tract infection.  They have also noticed over the past 1 to 2 days patient has become more weak, less responsive in with a decreased appetite.   Past Medical History:  Diagnosis Date  . Allergic rhinitis   . CAD (coronary artery disease)   . Carotid artery occlusion 02/15/2009  . CVA (cerebral infarction)   . Dementia (HCC)   . Diabetes mellitus   . Gastritis   . GERD (gastroesophageal reflux disease)   . Heart murmur   . Hematemesis   . Hemiplegia (HCC)   . HLD (hyperlipidemia)   . Hypertension   . Hypertensive chronic kidney disease   . Macular degeneration   . Persistent mood (affective) disorder, unspecified (HCC)   . Stroke (HCC)   . Thyroid disease    hypothyroidism  . Ulcer of esophagus without bleeding     Patient Active Problem List   Diagnosis Date Noted  . Sepsis (HCC) 10/27/2018  . Iron deficiency anemia due to chronic blood loss 09/06/2018  . Pressure injury of skin 09/04/2018  . ARF (acute renal failure) (HCC) 09/03/2018  . GI bleed 07/15/2018  . DKA (diabetic ketoacidoses) (HCC) 03/18/2018  . Upper  GI bleed 10/16/2015  . Intractable nausea and vomiting 10/16/2015  . Hypertensive urgency 10/16/2015  . Alzheimer's disease (HCC) 05/30/2015  . Altered mental status 05/24/2015  . Occlusion and stenosis of carotid artery without mention of cerebral infarction 10/08/2011  . CKD (chronic kidney disease) 09/25/2011  . H/O: CVA (cardiovascular accident) 09/22/2011  . Diabetes mellitus, type 2 (HCC) 09/22/2011    Past Surgical History:  Procedure Laterality Date  . APPENDECTOMY    . ESOPHAGOGASTRODUODENOSCOPY Left 10/17/2015   Procedure: ESOPHAGOGASTRODUODENOSCOPY (EGD);  Surgeon: Scot Junobert T Elliott, MD;  [facility];  Service: Endoscopy;  Laterality: Left;  . ESOPHAGOGASTRODUODENOSCOPY N/A 08/22/2018   Procedure: ESOPHAGOGASTRODUODENOSCOPY (EGD);  Surgeon: Toney ReilVanga, Rohini Reddy, MD;  [facility];  Service: Gastroenterology;  Laterality: N/A;  . TONSILLECTOMY      Prior to Admission medications   Medication Sig Start Date End Date Taking? Authorizing Provider  acetaminophen (TYLENOL) 325 MG tablet Take 650 mg by mouth every 4 (four) hours as needed.    [provider]  aluminum hydroxide [facility](DERMAGRAN) ointment Apply topically every 12 (twelve) hours as needed. Skin protectant to buttocks    [provider]  aspirin 81 MG chewable tablet Chew 1 tablet (81 mg total) by mouth daily. 10/31/18   Altamese DillingVachhani, Vaibhavkumar, MD  Cholecalciferol (VITAMIN D3) 50000 units CAPS Take 1 capsule by mouth every 30 (thirty) days.     [provider]  citalopram (CELEXA) 10 MG tablet Take 5 mg  by mouth daily.     [provider]  gabapentin (NEURONTIN) 300 MG capsule Take 300 mg by mouth every evening.     [provider]  guaiFENesin (ROBITUSSIN) 100 MG/5ML liquid Take 200 mg by mouth every 4 (four) hours as needed for cough.    [provider]  hydroxypropyl methylcellulose / hypromellose (ISOPTO TEARS / GONIOVISC) 2.5 % ophthalmic solution  Place 1 drop into both eyes 3 (three) times daily as needed for dry eyes.    [provider]  insulin glargine (LANTUS) 100 UNIT/ML injection Inject 0.1 mLs (10 Units total) into the skin daily. Patient taking differently: Inject 14 Units into the skin at bedtime.  09/05/18   [name], MD  levothyroxine (SYNTHROID, LEVOTHROID) 88 MCG tablet Take 88 mcg by mouth every evening.     [provider]  lovastatin (MEVACOR) 20 MG tablet Take 1 tablet (20 mg total) by mouth every evening. 10/31/18   [name], MD  memantine (NAMENDA) 10 MG tablet Take 10 mg by mouth 2 (two) times daily.    [provider]  Menthol (RICOLA CHERRY HONEY HERB) 2 MG LOZG Use as directed 1 lozenge in the mouth or throat every 2 (two) hours as needed (FOR COUGH).    [provider]  mirtazapine (REMERON) 15 MG tablet Take 15 mg by mouth at bedtime.    [provider]  omeprazole (PRILOSEC) 20 MG capsule Take 20 mg by mouth 2 (two) times daily.    [provider]  ondansetron (ZOFRAN) 4 MG tablet Take 4 mg by mouth daily as needed for nausea or vomiting.     [provider]  polyethylene glycol (MIRALAX / GLYCOLAX) packet Take 17 g by mouth daily.    [provider]  senna-docusate (SENOKOT-S) 8.6-50 MG tablet Take 2 tablets by mouth daily.    [provider]    Allergies  Allergen Reactions  . Fexofenadine Hcl Rash    Family History  Problem Relation Age of Onset  . Diabetes Mother   . Hypertension Mother   . Diabetes Father   . Hypertension Father   . Diabetes Sister   . Hypertension Sister     Social History Social History   Tobacco Use  . Smoking status: Former Smoker    Types: Cigarettes    Quit date: 07/20/1985    Years since quitting: 33.8  . Smokeless tobacco: Never Used  Substance Use Topics  . Alcohol use: No  . Drug use: No    Review of Systems Unable to obtain adequate/accurate review of systems  secondary to baseline dementia.  ____________________________________________   PHYSICAL EXAM:  VITAL SIGNS: ED Triage Vitals  Enc Vitals Group     BP 05/26/19 1100 (!) 145/69     Pulse Rate 05/26/19 1058 93     Resp 05/26/19 1058 18     Temp 05/26/19 1058 98.5 F (36.9 C)     Temp Source 05/26/19 1058 Axillary     SpO2 05/26/19 1051 98 %     Weight 05/26/19 1056 150 lb (68 kg)     Height 05/26/19 1056 5\' 2"  (1.575 m)     Head Circumference --      Peak Flow --      Pain Score --      Pain Loc --      Pain Edu? --      Excl. in GC? --    Constitutional: Alert and oriented. Well appearing  and in no distress. Eyes: Normal exam ENT      Head: Normocephalic and atraumatic.      Mouth/Throat: Mucous membranes are moist. Cardiovascular: Normal rate, regular rhythm.  Respiratory: Normal respiratory effort without tachypnea nor retractions. Breath sounds are clear  Gastrointestinal: Soft and nontender. No distention.   Musculoskeletal: Nontender with normal range of motion in all extremities.  Neurologic:  Normal speech and language. No gross focal neurologic deficits Skin:  Skin is warm, dry and intact.  Psychiatric: Mood and affect are normal.  ____________________________________________    EKG  EKG viewed and interpreted by myself shows a normal sinus rhythm at 93 bpm with a narrow QRS, normal axis, normal intervals, no concerning ST changes.  ____________________________________________    RADIOLOGY  Chest x-ray shows hazy bibasilar infiltrates.  ____________________________________________   INITIAL IMPRESSION / ASSESSMENT AND PLAN / ED COURSE  Pertinent labs & imaging results that were available during my care of the patient were reviewed by me and considered in my medical decision making (see chart for details).   Patient presents to the emergency department for possible GI bleed, coffee-ground emesis this morning as well as increased weakness decreased  responsiveness not eating or drinking as much.  We will check labs, chest x-ray, patient is currently being treated for urinary tract infection per report we will obtain urine, urine culture.  We will start the patient on Protonix bolus and infusion and continue to closely monitor in the emergency department.  Patient's work-up has resulted showing an elevated lactate of 2.9, receiving fluids we have started antibiotics to cover for her urinary tract infection as well.  Urine culture has been sent, blood cultures have been sent.  I discussed patient [facility] for transfer however given the possibility of a GI bleed they were hesitant to accept the patient, I then discussed with the patient's daughter who is her power of attorney he states she would like to have an endoscopy if it was emergently needed for the patient.  I then discussed the patient with [facility].  However as the patient has been stable for many hours now in the emergency department with no further emesis and is guaiac negative on rectal exam they believe the patient would still be most appropriate for [facility].  We will discuss with [facility] for admission once again.  [name] [name] was evaluated in Emergency Department on 05/26/2019 for the symptoms described in the history of present illness. She was evaluated in the context of the global COVID-19 pandemic, which necessitated consideration that the patient might be at risk for infection with the SARS-CoV-2 virus that causes COVID-19. Institutional protocols and algorithms that pertain to the evaluation of patients at risk for COVID-19 are in a state of rapid change based on information released by regulatory bodies including the CDC and federal and state organizations. These policies and algorithms were followed during the patient's care in the ED.  ____________________________________________   FINAL CLINICAL IMPRESSION(S) / ED  DIAGNOSES  Weakness COVID-19 GI bleed   Harvest Dark, MD 05/29/19 2154

## 2019-05-26 NOTE — ED Notes (Signed)
Dr. Charna Archer informed of +covid

## 2019-05-26 NOTE — ED Triage Notes (Signed)
PT to ED via EMS from [facility]. EMS called for 1 episode of coffee ground emesis this morning, pt has hx of GI bleed. PT also has covid and is been being treated for UTI. PT has been running fevers and receiving tylenol. Poor intake recently. Dementia baseline.

## 2019-05-26 NOTE — ED Notes (Signed)
Pt unable to sign for transport due to dementia and contractures. Daughter aware pt is being transferred to [facility]

## 2019-05-26 NOTE — ED Notes (Signed)
Called to inform daughter pt leaving the facility

## 2019-05-26 NOTE — ED Notes (Signed)
Spoke with daughter regarding pt's status. Daughter concerned about mother being here alone. Daughter reassured that we are monitoring patient continuously and will contact her if needed or if any changes arise. Daughter informed she will be transferred out due to covid + status and she voices understanding.

## 2019-05-26 NOTE — ED Notes (Signed)
Dr. Paduchowski informed of critical lactic 

## 2019-05-26 NOTE — ED Notes (Signed)
covid swab and urine walked to the lab

## 2019-05-26 NOTE — ED Notes (Signed)
Report given to [facility] 

## 2019-05-26 NOTE — ED Notes (Signed)
Attempted blood draw but was unsuccessful, lab to come draw tubes

## 2019-05-26 NOTE — ED Notes (Signed)
Report given to Serena RN

## 2019-05-26 NOTE — H&P (Signed)
History and Physical    [name] [name] XMI:[id number] DOB: Aug 23, 1940 DOA: 05/26/2019  PCP: Karie Schwalbe, MD  Patient coming from: sn[facility]  Chief Complaint: fever  HPI: [name] [name] is a 78 y.o. female with medical history significant of dementia, CAD, CVA, HTN, DM presents to ed with weakness and fever and vomiting.  Pos covid 10/28 at snf pt severely demented and bedbound at baseline due to also h/o cva.  No history can be obtained from pt due to dementia.  Pt found to have uti and covid pna with nml vitals and 02 sats.     Review of Systems: unobtainable due to dementia   Past Medical History:  Diagnosis Date  . Allergic rhinitis   . CAD (coronary artery disease)   . Carotid artery occlusion 02/15/2009  . CVA (cerebral infarction)   . Dementia (HCC)   . Diabetes mellitus   . Gastritis   . GERD (gastroesophageal reflux disease)   . Heart murmur   . Hematemesis   . Hemiplegia (HCC)   . HLD (hyperlipidemia)   . Hypertension   . Hypertensive chronic kidney disease   . Macular degeneration   . Persistent mood (affective) disorder, unspecified (HCC)   . Stroke (HCC)   . Thyroid disease    hypothyroidism  . Ulcer of esophagus without bleeding     Past Surgical History:  Procedure Laterality Date  . APPENDECTOMY    . ESOPHAGOGASTRODUODENOSCOPY Left 10/17/2015   Procedure: ESOPHAGOGASTRODUODENOSCOPY (EGD);  Surgeon: Scot Jun, MD;  Location: [facility] ENDOSCOPY;  Service: Endoscopy;  Laterality: Left;  . ESOPHAGOGASTRODUODENOSCOPY N/A 08/22/2018   Procedure: ESOPHAGOGASTRODUODENOSCOPY (EGD);  Surgeon: [name], MD;  Location[facility]OSCOPY;  Service: Gastroenterology;  Laterality: N/A;  . TONSILLECTOMY       reports that she quit smoking about 33 years ago. Her smoking use included cigarettes. She has never used smokeless tobacco. She reports that she does not drink alcohol or use drugs.  Allergies  Allergen Reactions  . Fexofenadine Hcl Rash     Family History  Problem Relation Age of Onset  . Diabetes Mother   . Hypertension Mother   . Diabetes Father   . Hypertension Father   . Diabetes Sister   . Hypertension Sister     Prior to Admission medications   Medication Sig Start Date End Date Taking? Authorizing Provider  acetaminophen (TYLENOL) 325 MG tablet Take 650 mg by mouth every 4 (four) hours as needed.    [provider]  aluminum hydroxide [facility]) ointment Apply topically every 12 (twelve) hours as needed. Skin protectant to buttocks    [provider]  aspirin 81 MG chewable tablet Chew 1 tablet (81 mg total) by mouth daily. 10/31/18   [name], MD  Cholecalciferol (VITAMIN D3) 50000 units CAPS Take 1 capsule by mouth every 30 (thirty) days.     [provider]  citalopram (CELEXA) 10 MG tablet Take 5 mg by mouth daily.     [provider]  gabapentin (NEURONTIN) 300 MG capsule Take 300 mg by mouth every evening.     [provider]  guaiFENesin (ROBITUSSIN) 100 MG/5ML liquid Take 200 mg by mouth every 4 (four) hours as needed for cough.    [provider]  hydroxypropyl methylcellulose / hypromellose (ISOPTO TEARS / GONIOVISC) 2.5 % ophthalmic solution Place 1 drop into both eyes 3 (three) times daily as needed for dry eyes.    [provider]  insulin glargine (LANTUS)  100 UNIT/ML injection Inject 0.1 mLs (10 Units total) into the skin daily. Patient taking differently: Inject 20 Units into the skin at bedtime.  09/05/18   Enedina FinnerPatel, Sona, MD  insulin regular (NOVOLIN R) 100 units/mL injection Inject 0-16 Units into the skin 3 (three) times daily before meals.    [provider]  levothyroxine (SYNTHROID, LEVOTHROID) 88 MCG tablet Take 88 mcg by mouth every evening.     [provider]  lovastatin (MEVACOR) 20 MG tablet Take 1 tablet (20 mg total) by mouth every evening. 10/31/18   Altamese DillingVachhani, Vaibhavkumar, MD  memantine (NAMENDA) 10  MG tablet Take 10 mg by mouth 2 (two) times daily.    [provider]  Menthol (RICOLA CHERRY HONEY HERB) 2 MG LOZG Use as directed 1 lozenge in the mouth or throat every 2 (two) hours as needed (FOR COUGH).    [provider]  mirtazapine (REMERON) 15 MG tablet Take 15 mg by mouth at bedtime.    [provider]  nitrofurantoin, macrocrystal-monohydrate, (MACROBID) 100 MG capsule Take 100 mg by mouth 2 (two) times daily.    [provider]  omeprazole (PRILOSEC) 20 MG capsule Take 20 mg by mouth 2 (two) times daily.    [provider]  ondansetron (ZOFRAN) 4 MG tablet Take 4 mg by mouth daily as needed for nausea or vomiting.     [provider]  polyethylene glycol (MIRALAX / GLYCOLAX) packet Take 17 g by mouth daily.    [provider]  senna-docusate (SENOKOT-S) 8.6-50 MG tablet Take 2 tablets by mouth daily.    [provider]    Physical Exam: Vitals:   05/26/19 2302  BP: 137/65  Pulse: 88  Resp: 15  Temp: 98.5 F (36.9 C)  TempSrc: Axillary  SpO2: 99%      Constitutional: NAD, calm, comfortable Vitals:   05/26/19 2302  BP: 137/65  Pulse: 88  Resp: 15  Temp: 98.5 F (36.9 C)  TempSrc: Axillary  SpO2: 99%   Eyes: PERRL, lids and conjunctivae normal ENMT: Mucous membranes are moist. Posterior pharynx clear of any exudate or lesions.Normal dentition.  Neck: normal, supple, no masses, no thyromegaly Respiratory: clear to auscultation bilaterally, no wheezing, no crackles. Normal respiratory effort. No accessory muscle use.  Cardiovascular: Regular rate and rhythm, no murmurs / rubs / gallops. No extremity edema. 2+ pedal pulses. No carotid bruits.  Abdomen: no tenderness, no masses palpated. No hepatosplenomegaly. Bowel sounds positive.  Musculoskeletal: no clubbing / cyanosis. No joint deformity upper and lower extremities. Good ROM, no contractures. Normal muscle tone.  Skin: no rashes, lesions,  ulcers. No induration Neurologic:  dementia Psychiatric: Not agitated   Labs on Admission: I have personally reviewed following labs and imaging studies  CBC: Recent Labs  Lab 05/26/19 1121  WBC 7.6  NEUTROABS 6.0  HGB 14.6  HCT 44.2  MCV 83.6  PLT 261   Basic Metabolic Panel: Recent Labs  Lab 05/26/19 1121  NA 145  K 3.6  CL 106  CO2 24  GLUCOSE 300*  BUN 54*  CREATININE 1.68*  CALCIUM 8.9   GFR: Estimated Creatinine Clearance: 25 mL/min (A) (by C-G formula based on SCr of 1.68 mg/dL (H)). Liver Function Tests: Recent Labs  Lab 05/26/19 1121  AST 34  ALT 21  ALKPHOS 72  BILITOT 0.9  PROT 7.4  ALBUMIN 3.1*   No results for input(s): LIPASE, AMYLASE in the last 168 hours. No results for input(s): AMMONIA in the  last 168 hours. Coagulation Profile: No results for input(s): INR, PROTIME in the last 168 hours. Cardiac Enzymes: No results for input(s): CKTOTAL, CKMB, CKMBINDEX, TROPONINI in the last 168 hours. BNP (last 3 results) No results for input(s): PROBNP in the last 8760 hours. HbA1C: No results for input(s): HGBA1C in the last 72 hours. CBG: No results for input(s): GLUCAP in the last 168 hours. Lipid Profile: No results for input(s): CHOL, HDL, LDLCALC, TRIG, CHOLHDL, LDLDIRECT in the last 72 hours. Thyroid Function Tests: No results for input(s): TSH, T4TOTAL, FREET4, T3FREE, THYROIDAB in the last 72 hours. Anemia Panel: No results for input(s): VITAMINB12, FOLATE, FERRITIN, TIBC, IRON, RETICCTPCT in the last 72 hours. Urine analysis:    Component Value Date/Time   COLORURINE AMBER (A) 05/26/2019 1121   APPEARANCEUR CLOUDY (A) 05/26/2019 1121   APPEARANCEUR Clear 11/16/2013 2342   LABSPEC 1.019 05/26/2019 1121   LABSPEC 1.019 11/16/2013 2342   PHURINE 5.0 05/26/2019 1121   GLUCOSEU 150 (A) 05/26/2019 1121   GLUCOSEU 150 mg/dL 11/16/2013 2342   HGBUR SMALL (A) 05/26/2019 1121   BILIRUBINUR NEGATIVE 05/26/2019 1121   BILIRUBINUR Negative  11/16/2013 2342   KETONESUR 5 (A) 05/26/2019 1121   PROTEINUR 30 (A) 05/26/2019 1121   UROBILINOGEN 0.2 09/22/2011 1412   NITRITE NEGATIVE 05/26/2019 1121   LEUKOCYTESUR LARGE (A) 05/26/2019 1121   LEUKOCYTESUR Negative 11/16/2013 2342   Sepsis Labs: !!!!!!!!!!!!!!!!!!!!!!!!!!!!!!!!!!!!!!!!!!!! @LABRCNTIP (procalcitonin:4,lacticidven:4) ) Recent Results (from the past 240 hour(s))  SARS CORONAVIRUS 2 (TAT 6-24 HRS) Nasopharyngeal     Status: Abnormal   Collection Time: 05/26/19 11:31 AM   Specimen: Nasopharyngeal  Result Value Ref Range Status   SARS Coronavirus 2 POSITIVE (A) NEGATIVE Final    Comment: RESULT CALLED TO, READ BACK BY AND VERIFIED WITH: [name] [id number] 05/26/2019 [name] (NOTE) SARS-CoV-2 target nucleic acids are DETECTED. The SARS-CoV-2 RNA is generally detectable in upper and lower respiratory specimens during the acute phase of infection. Positive results are indicative of active infection with SARS-CoV-2. Clinical  correlation with patient history and other diagnostic information is necessary to determine patient infection status. Positive results do  not rule out bacterial infection or co-infection with other viruses. The expected result is Negative. Fact Sheet for Patients: SugarRoll.be Fact Sheet for Healthcare Providers: https://www.woods-mathews.com/ This test is not yet approved or cleared by the [place] FDA and  has been authorized for detection and/or diagnosis of SARS-CoV-2 by FDA under an Emergency Use Authorization (EUA). This EUA will remain  in effect (meaning this test can be used)  for the duration of the COVID-19 declaration under Section 564(b)(1) of the Act, 21 U.S.C. section 360bbb-3(b)(1), unless the authorization is terminated or revoked sooner. Performed at [facility], [place] [address] [place], [place] [address]      Radiological Exams on Admission: Dg Chest Portable 1  View  Result Date: 05/26/2019 CLINICAL DATA:  Episode of coffee-ground emesis this morning, GI bleed, COVID-19 EXAM: PORTABLE CHEST 1 VIEW COMPARISON:  Portable exam 1129 hours compared to 10/27/2018 FINDINGS: Enlargement of cardiac silhouette. Atherosclerotic calcification aorta. Mediastinal contours and pulmonary vascularity normal. Scattered infiltrates at the mid to lower lungs bilaterally. Associated mild atelectasis at LEFT base. No pleural effusion or pneumothorax. IMPRESSION: Hazy bibasilar infiltrates and LEFT basilar subsegmental atelectasis. Enlargement of cardiac silhouette. Aortic Atherosclerosis (ICD10-I70.0). Electronically Signed   By: Lavonia Dana M.D.   On: 05/26/2019 11:58    cxr reviewed bilateral opacities Old chart reviewed   Assessment/Plan 78 yo female  dementia comes in with fever from snf with uti and covid pna  Principal Problem:   UTI (urinary tract infection)- place on rocephin  iv daily and f./u on uc and bc.  Mild lactic acidosis resolved with ivf.  No further ivf.  Active Problems:   ARF (acute renal failure) (HCC)- repeat in am.  Baseline cr 1 bumped up to 1.68.  Given fluids in ed will hold off any further in setting of covid infection    Pneumonia due to COVID-19 virus- place only decadron at this time.  Vit c/zinc Holding off on anticoagulation due to concern of gib, however hgb above 12 and heme neg, monitor and if no further evidence of vomiting reconsider AC    H/O: CVA (cerebrovascular accident)- noted, at baseline overall poor functional status    Diabetes mellitus, type 2 (HCC)- ssi    CKD (chronic kidney disease)- with aki.  As above    Alzheimer's disease (HCC)- noted  N/v- question of bleed - no clear evidence at this time.  Monitor.  Holding [facility] for 24 hours, reassess tomorrow.  Placed on protonix  iv q 12 hour, stop drip.     DVT prophylaxis:  scds Code Status:  full Family Communication:  none Disposition Plan:  days Consults  called:  none Admission status:  admission   DAVID,RACHAL A MD [facility]  If 7PM-7AM, please contact night-coverage [email or web address] Password [facility]  05/26/2019, 11:47 PM  '

## 2019-05-27 ENCOUNTER — Other Ambulatory Visit: Payer: Self-pay

## 2019-05-27 ENCOUNTER — Encounter (HOSPITAL_COMMUNITY): Payer: Self-pay

## 2019-05-27 LAB — CBC WITH DIFFERENTIAL/PLATELET
Abs Immature Granulocytes: 0.08 10*3/uL — ABNORMAL HIGH (ref 0.00–0.07)
Basophils Absolute: 0.1 10*3/uL (ref 0.0–0.1)
Basophils Relative: 1 %
Eosinophils Absolute: 0 10*3/uL (ref 0.0–0.5)
Eosinophils Relative: 0 %
HCT: 44.1 % (ref 36.0–46.0)
Hemoglobin: 13.8 g/dL (ref 12.0–15.0)
Immature Granulocytes: 1 %
Lymphocytes Relative: 14 %
Lymphs Abs: 1.1 10*3/uL (ref 0.7–4.0)
MCH: 28.1 pg (ref 26.0–34.0)
MCHC: 31.3 g/dL (ref 30.0–36.0)
MCV: 89.8 fL (ref 80.0–100.0)
Monocytes Absolute: 0.5 10*3/uL (ref 0.1–1.0)
Monocytes Relative: 7 %
Neutro Abs: 5.9 10*3/uL (ref 1.7–7.7)
Neutrophils Relative %: 77 %
Platelets: 300 10*3/uL (ref 150–400)
RBC: 4.91 MIL/uL (ref 3.87–5.11)
RDW: 13.7 % (ref 11.5–15.5)
WBC: 7.6 10*3/uL (ref 4.0–10.5)
nRBC: 0 % (ref 0.0–0.2)

## 2019-05-27 LAB — COMPREHENSIVE METABOLIC PANEL
ALT: 19 U/L (ref 0–44)
AST: 29 U/L (ref 15–41)
Albumin: 2.9 g/dL — ABNORMAL LOW (ref 3.5–5.0)
Alkaline Phosphatase: 76 U/L (ref 38–126)
Anion gap: 19 — ABNORMAL HIGH (ref 5–15)
BUN: 53 mg/dL — ABNORMAL HIGH (ref 8–23)
CO2: 22 mmol/L (ref 22–32)
Calcium: 9.1 mg/dL (ref 8.9–10.3)
Chloride: 110 mmol/L (ref 98–111)
Creatinine, Ser: 1.64 mg/dL — ABNORMAL HIGH (ref 0.44–1.00)
GFR calc Af Amer: 34 mL/min — ABNORMAL LOW (ref >60)
GFR calc non Af Amer: 30 mL/min — ABNORMAL LOW (ref >60)
Glucose, Bld: 381 mg/dL — ABNORMAL HIGH (ref 70–99)
Potassium: 4.5 mmol/L (ref 3.5–5.1)
Sodium: 151 mmol/L — ABNORMAL HIGH (ref 135–145)
Total Bilirubin: 0.9 mg/dL (ref 0.3–1.2)
Total Protein: 7.1 g/dL (ref 6.5–8.1)

## 2019-05-27 LAB — C-REACTIVE PROTEIN: CRP: 19.2 mg/dL — ABNORMAL HIGH (ref <1.0)

## 2019-05-27 LAB — HEMOGLOBIN AND HEMATOCRIT, BLOOD
HCT: 43.4 % (ref 36.0–46.0)
Hemoglobin: 14.2 g/dL (ref 12.0–15.0)

## 2019-05-27 LAB — GLUCOSE, CAPILLARY
Glucose-Capillary: 417 mg/dL — ABNORMAL HIGH (ref 70–99)
Glucose-Capillary: 272 mg/dL — ABNORMAL HIGH (ref 70–99)
Glucose-Capillary: 58 mg/dL — ABNORMAL LOW (ref 70–99)
Glucose-Capillary: 56 mg/dL — ABNORMAL LOW (ref 70–99)
Glucose-Capillary: 95 mg/dL (ref 70–99)

## 2019-05-27 LAB — HEMOGLOBIN A1C
Hgb A1c MFr Bld: 11.6 % — ABNORMAL HIGH (ref 4.8–5.6)
Mean Plasma Glucose: 286.22 mg/dL

## 2019-05-27 LAB — ABO/RH: ABO/RH(D): O NEG

## 2019-05-27 LAB — D-DIMER, QUANTITATIVE: D-Dimer, Quant: 5.33 ug/mL-FEU — ABNORMAL HIGH (ref 0.00–0.50)

## 2019-05-27 MED ORDER — GLUCOSE 40 % PO GEL
1.0000 | ORAL | Status: AC
  Administered 2019-05-27: 37.5 g via ORAL
  Filled 2019-05-27: qty 1

## 2019-05-27 MED ORDER — SODIUM CHLORIDE 0.45 % IV SOLN
INTRAVENOUS | Status: AC
  Administered 2019-05-27 – 2019-05-28 (×3): via INTRAVENOUS

## 2019-05-27 MED ORDER — INSULIN ASPART 100 UNIT/ML ~~LOC~~ SOLN: 0.0000 [IU] | Freq: Every day | SUBCUTANEOUS | Status: DC

## 2019-05-27 MED ORDER — INSULIN GLARGINE 100 UNIT/ML ~~LOC~~ SOLN: 10.0000 [IU] | Freq: Every day | SUBCUTANEOUS | Status: DC

## 2019-05-27 MED ORDER — INSULIN GLARGINE 100 UNIT/ML ~~LOC~~ SOLN
10.0000 [IU] | Freq: Two times a day (BID) | SUBCUTANEOUS | Status: DC
  Administered 2019-05-27: 10 [IU] via SUBCUTANEOUS
  Filled 2019-05-27 (×3): qty 0.1

## 2019-05-27 MED ORDER — INSULIN ASPART 100 UNIT/ML ~~LOC~~ SOLN
0.0000 [IU] | Freq: Three times a day (TID) | SUBCUTANEOUS | Status: DC
  Administered 2019-05-27: 20 [IU] via SUBCUTANEOUS
  Administered 2019-05-27: 11 [IU] via SUBCUTANEOUS

## 2019-05-27 MED ORDER — LORAZEPAM 2 MG/ML IJ SOLN
1.0000 mg | Freq: Once | INTRAMUSCULAR | Status: AC
  Administered 2019-05-27: 1 mg via INTRAVENOUS
  Filled 2019-05-27: qty 1

## 2019-05-27 MED ORDER — HEPARIN SODIUM (PORCINE) 10000 UNIT/ML IJ SOLN
7500.0000 [IU] | Freq: Three times a day (TID) | INTRAMUSCULAR | Status: DC
  Filled 2019-05-27: qty 1

## 2019-05-27 MED ORDER — HEPARIN SODIUM (PORCINE) 5000 UNIT/ML IJ SOLN
5000.0000 [IU] | Freq: Three times a day (TID) | INTRAMUSCULAR | Status: DC
  Administered 2019-05-27 – 2019-06-02 (×17): 5000 [IU] via SUBCUTANEOUS
  Filled 2019-05-27 (×17): qty 1

## 2019-05-27 NOTE — Plan of Care (Signed)
New admit on 11/06

## 2019-05-27 NOTE — Progress Notes (Addendum)
PROGRESS NOTE    [name] [name]  CXK:[id number] DOB: 26-Nov-1940 DOA: 05/26/2019 PCP: [name] Schwalbe, MD   Brief Narrative:  [name] [name] is [name] [name] 78 y.o. female with medical history significant of dementia, CAD, CVA, HTN, DM presents to ed with weakness and fever and vomiting.  Pos covid 10/28 at snf pt severely demented and bedbound at baseline due to also h/o cva.  No history can be obtained from pt due to dementia.  Pt found to have uti and covid pna with nml vitals and 02 sats.    Assessment & Plan:   Principal Problem:   UTI (urinary tract infection) Active Problems:   H/O: CVA (cerebrovascular accident)   Diabetes mellitus, type 2 (HCC)   CKD (chronic kidney disease)   Alzheimer's disease (HCC)   ARF (acute renal failure) (HCC)   Pneumonia due to COVID-19 virus   Urinary Tract Infection: pyuria on urinalysis.  Urine and blood cx pending.  She had weakness, N/V, and fever, could be related to this vs COVID.  Continue ceftriaxone Blood and urine cx pending  AKI  Hypernatremia: baseline creatinine 1, bumped to 1.68. Stable today with hypernatremia.  Start 1/2 NS and follow. Consider renal US if not improving or worsening  COVID 19 Pneumonia: CXR with hazy bibasilar infiltrates and L basilar subsegmental atelecatasis.  S/p dex.  Will hold additional dex as pt is not hypoxic.  Hold off on remdesivir at this point as well with recorded O2 sats >94%.   Follow CXR in AM Elevated D dimer -> holding off on high dose lovenox at this time as concern for GI bleed -> none recurrent, consider starting this afternoon. - stable H/H, start heparin (regular dose, consider increasing if tolerating this).  H/o CVA  Dementia: continue to monitor - delirium precautions  T2DM: add on basal insulin, SSI, follow closely  Concern for GI bleed: hematemesis reported in ED.  Guiac negative per EDP.  Continue PPI.  Repeat PM H/H and follow.  Will check with pharmacy regarding pt med rec and  start as able  DVT prophylaxis: SCD Code Status: full  Family Communication: called daughter x 2 no answer, called SIL, no answer - discussed with daughter at 31 PM, [name] [name].  She's [name] [name] bit upset.  Was under the impression that she was going to [facility].  Discussed overall plan of care in detail. She expresses understanding with current POC.  Disposition Plan: pending   Consultants:   none  Procedures:   none  Antimicrobials:  Anti-infectives (From admission, onward)   Start     Dose/Rate Route Frequency Ordered Stop   05/27/19 1300  cefTRIAXone (ROCEPHIN) 1 g in sodium chloride 0.9 % 100 mL IVPB     1 g 200 mL/hr over 30 Minutes Intravenous Every 24 hours 05/26/19 2207        Subjective: Confused, not able to tell me anything.    Objective: Vitals:   05/26/19 2302 05/27/19 0400 05/27/19 0800  BP: 137/65 (!) 123/55 137/67  Pulse: 88 83 88  Resp: 15 16 20   Temp: 98.5 F (36.9 C) 98.7 F (37.1 C) (!) 97.4 F (36.3 C)  TempSrc: Axillary Axillary Axillary  SpO2: 99% 97% 97%  Weight: 59 kg    Height: 5\' 2"  (1.575 m)     No intake or output data in the 24 hours ending 05/27/19 1157 Filed Weights   05/26/19 2302  Weight: 59 kg    Examination:  General exam: Appears calm  and comfortable Respiratory system: unlabored Cardiovascular system: pt won't allow me to examine, tries to bite Gastrointestinal system: limited as pt does not allow me to examine, grabs my hand and tries to bite Central nervous system: Alert and confused.  Does not follow commands. Extremities: no LEE   Data Reviewed: I have personally reviewed following labs and imaging studies  CBC: Recent Labs  Lab 05/26/19 1121 05/27/19 0037  WBC 7.6 7.6  NEUTROABS 6.0 5.9  HGB 14.6 13.8  HCT 44.2 44.1  MCV 83.6 89.8  PLT 261 300   Basic Metabolic Panel: Recent Labs  Lab 05/26/19 1121 05/27/19 0037  NA 145 151*  [name] 3.6 4.5  CL 106 110  CO2 24 22  GLUCOSE 300* 381*  BUN 54* 53*  CREATININE  1.68* 1.64*  CALCIUM 8.9 9.1   GFR: Estimated Creatinine Clearance: 22.4 mL/min ([name] [name]) (by C-G formula based on SCr of 1.64 mg/dL (H)). Liver Function Tests: Recent Labs  Lab 05/26/19 1121 05/27/19 0037  AST 34 29  ALT 21 19  ALKPHOS 72 76  BILITOT 0.9 0.9  PROT 7.4 7.1  ALBUMIN 3.1* 2.9*   No results for input(s): LIPASE, AMYLASE in the last 168 hours. No results for input(s): AMMONIA in the last 168 hours. Coagulation Profile: No results for input(s): INR, PROTIME in the last 168 hours. Cardiac Enzymes: No results for input(s): CKTOTAL, CKMB, CKMBINDEX, TROPONINI in the last 168 hours. BNP (last 3 results) No results for input(s): PROBNP in the last 8760 hours. HbA1C: Recent Labs    05/26/19 2235  HGBA1C 11.6*   CBG: Recent Labs  Lab 05/27/19 1139  GLUCAP 417*   Lipid Profile: No results for input(s): CHOL, HDL, LDLCALC, TRIG, CHOLHDL, LDLDIRECT in the last 72 hours. Thyroid Function Tests: No results for input(s): TSH, T4TOTAL, FREET4, T3FREE, THYROIDAB in the last 72 hours. Anemia Panel: No results for input(s): VITAMINB12, FOLATE, FERRITIN, TIBC, IRON, RETICCTPCT in the last 72 hours. Sepsis Labs: Recent Labs  Lab 05/26/19 1121 05/26/19 1534  PROCALCITON  --  <0.10  LATICACIDVEN 2.9* 1.5    Recent Results (from the past 240 hour(s))  SARS CORONAVIRUS 2 (TAT 6-24 HRS) Nasopharyngeal     Status: Abnormal   Collection Time: 05/26/19 11:31 AM   Specimen: Nasopharyngeal  Result Value Ref Range Status   SARS Coronavirus 2 POSITIVE ([name] [name]) NEGATIVE Final    Comment: RESULT CALLED TO, READ BACK BY AND VERIFIED WITH: [name] [name] 1820 05/26/2019 [name] [name] (NOTE) SARS-CoV-2 target nucleic acids are DETECTED. The SARS-CoV-2 RNA is generally detectable in upper and lower respiratory specimens during the acute phase of infection. Positive results are indicative of active infection with SARS-CoV-2. Clinical  correlation with patient history and other diagnostic  information is necessary to determine patient infection status. Positive results do  not rule out bacterial infection or co-infection with other viruses. The expected result is Negative. Fact Sheet for Patients: HairSlick.nohttps://www.fda.gov/media/138098/download Fact Sheet for Healthcare Providers: quierodirigir.comhttps://www.fda.gov/media/138095/download This test is not yet approved or cleared by the [place] FDA and  has been authorized for detection and/or diagnosis of SARS-CoV-2 by FDA under an Emergency Use Authorization (EUA). This EUA will remain  in effect (meaning this test can be used)  for the duration of the COVID-19 declaration under Section 564(b)(1) of the Act, 21 U.S.C. section 360bbb-3(b)(1), unless the authorization is terminated or revoked sooner. Performed at [facility], [address] [place], [place] [address]  Radiology Studies: Dg Chest Portable 1 View  Result Date: 05/26/2019 CLINICAL DATA:  Episode of coffee-ground emesis this morning, GI bleed, COVID-19 EXAM: PORTABLE CHEST 1 VIEW COMPARISON:  Portable exam 1129 hours compared to 10/27/2018 FINDINGS: Enlargement of cardiac silhouette. Atherosclerotic calcification aorta. Mediastinal contours and pulmonary vascularity normal. Scattered infiltrates at the mid to lower lungs bilaterally. Associated mild atelectasis at LEFT base. No pleural effusion or pneumothorax. IMPRESSION: Hazy bibasilar infiltrates and LEFT basilar subsegmental atelectasis. Enlargement of cardiac silhouette. Aortic Atherosclerosis (ICD10-I70.0). Electronically Signed   By: Lavonia Dana M.D.   On: 05/26/2019 11:58        Scheduled Meds: . dexamethasone (DECADRON) injection  6 mg Intravenous Q24H  . insulin aspart  0-20 Units Subcutaneous TID WC  . insulin aspart  0-5 Units Subcutaneous QHS  . insulin glargine  10 Units Subcutaneous BID  . pantoprazole (PROTONIX) IV  40 mg Intravenous Q12H  . sodium chloride flush  3 mL Intravenous  Q12H  . vitamin C  500 mg Oral Daily  . zinc sulfate  220 mg Oral Daily   Continuous Infusions: . sodium chloride 75 mL/hr at 05/27/19 0822  . sodium chloride    . cefTRIAXone (ROCEPHIN)  IV       LOS: 1 day    Time spent: over 30 min    Fayrene Helper, MD [facility] Pager AMION  If 7PM-7AM, please contact night-coverage [email or web address] Password [facility] 05/27/2019, 11:57 AM

## 2019-05-27 NOTE — Progress Notes (Signed)
Spoke with Arbie Cookey (daughter). Updated on condition. No further concerns at this time. Will update as needed.

## 2019-05-28 ENCOUNTER — Inpatient Hospital Stay (HOSPITAL_COMMUNITY): Payer: Medicare Other

## 2019-05-28 LAB — COMPREHENSIVE METABOLIC PANEL
ALT: 19 U/L (ref 0–44)
AST: 23 U/L (ref 15–41)
Albumin: 2.9 g/dL — ABNORMAL LOW (ref 3.5–5.0)
Alkaline Phosphatase: 69 U/L (ref 38–126)
Anion gap: 13 (ref 5–15)
BUN: 57 mg/dL — ABNORMAL HIGH (ref 8–23)
CO2: 23 mmol/L (ref 22–32)
Calcium: 8.7 mg/dL — ABNORMAL LOW (ref 8.9–10.3)
Chloride: 112 mmol/L — ABNORMAL HIGH (ref 98–111)
Creatinine, Ser: 1.82 mg/dL — ABNORMAL HIGH (ref 0.44–1.00)
GFR calc Af Amer: 30 mL/min — ABNORMAL LOW (ref >60)
GFR calc non Af Amer: 26 mL/min — ABNORMAL LOW (ref >60)
Glucose, Bld: 108 mg/dL — ABNORMAL HIGH (ref 70–99)
Potassium: 3.9 mmol/L (ref 3.5–5.1)
Sodium: 148 mmol/L — ABNORMAL HIGH (ref 135–145)
Total Bilirubin: 0.8 mg/dL (ref 0.3–1.2)
Total Protein: 6.8 g/dL (ref 6.5–8.1)

## 2019-05-28 LAB — CBC WITH DIFFERENTIAL/PLATELET
Abs Immature Granulocytes: 0.09 10*3/uL — ABNORMAL HIGH (ref 0.00–0.07)
Basophils Absolute: 0 10*3/uL (ref 0.0–0.1)
Basophils Relative: 0 %
Eosinophils Absolute: 0 10*3/uL (ref 0.0–0.5)
Eosinophils Relative: 0 %
HCT: 40 % (ref 36.0–46.0)
Hemoglobin: 12.9 g/dL (ref 12.0–15.0)
Immature Granulocytes: 1 %
Lymphocytes Relative: 15 %
Lymphs Abs: 1.6 10*3/uL (ref 0.7–4.0)
MCH: 27.9 pg (ref 26.0–34.0)
MCHC: 32.3 g/dL (ref 30.0–36.0)
MCV: 86.4 fL (ref 80.0–100.0)
Monocytes Absolute: 0.7 10*3/uL (ref 0.1–1.0)
Monocytes Relative: 6 %
Neutro Abs: 8.4 10*3/uL — ABNORMAL HIGH (ref 1.7–7.7)
Neutrophils Relative %: 78 %
Platelets: 321 10*3/uL (ref 150–400)
RBC: 4.63 MIL/uL (ref 3.87–5.11)
RDW: 13.5 % (ref 11.5–15.5)
WBC: 10.9 10*3/uL — ABNORMAL HIGH (ref 4.0–10.5)
nRBC: 0 % (ref 0.0–0.2)

## 2019-05-28 LAB — URINE CULTURE: Culture: >=100000 — AB

## 2019-05-28 LAB — C-REACTIVE PROTEIN: CRP: 9.7 mg/dL — ABNORMAL HIGH (ref <1.0)

## 2019-05-28 LAB — GLUCOSE, CAPILLARY
Glucose-Capillary: 147 mg/dL — ABNORMAL HIGH (ref 70–99)
Glucose-Capillary: 154 mg/dL — ABNORMAL HIGH (ref 70–99)
Glucose-Capillary: 203 mg/dL — ABNORMAL HIGH (ref 70–99)

## 2019-05-28 LAB — D-DIMER, QUANTITATIVE: D-Dimer, Quant: 3.35 ug/mL-FEU — ABNORMAL HIGH (ref 0.00–0.50)

## 2019-05-28 LAB — FERRITIN: Ferritin: 709 ng/mL — ABNORMAL HIGH (ref 11–307)

## 2019-05-28 LAB — TSH: TSH: 5.12 u[IU]/mL — ABNORMAL HIGH (ref 0.350–4.500)

## 2019-05-28 MED ORDER — MEMANTINE HCL 10 MG PO TABS
10.0000 mg | ORAL_TABLET | Freq: Two times a day (BID) | ORAL | Status: DC
  Administered 2019-05-28 – 2019-05-31 (×3): 10 mg via ORAL
  Filled 2019-05-28 (×13): qty 1

## 2019-05-28 MED ORDER — INSULIN ASPART 100 UNIT/ML ~~LOC~~ SOLN
0.0000 [IU] | SUBCUTANEOUS | Status: DC
  Administered 2019-05-28: 1 [IU] via SUBCUTANEOUS
  Administered 2019-05-28: 2 [IU] via SUBCUTANEOUS
  Administered 2019-05-28: 3 [IU] via SUBCUTANEOUS
  Administered 2019-05-29 – 2019-05-30 (×5): 2 [IU] via SUBCUTANEOUS
  Administered 2019-05-31: 3 [IU] via SUBCUTANEOUS
  Administered 2019-05-31 (×2): 2 [IU] via SUBCUTANEOUS
  Administered 2019-05-31: 3 [IU] via SUBCUTANEOUS
  Administered 2019-06-01 (×3): 1 [IU] via SUBCUTANEOUS
  Administered 2019-06-01 – 2019-06-02 (×3): 2 [IU] via SUBCUTANEOUS

## 2019-05-28 MED ORDER — PANTOPRAZOLE SODIUM 40 MG PO TBEC
40.0000 mg | DELAYED_RELEASE_TABLET | Freq: Two times a day (BID) | ORAL | Status: DC
  Administered 2019-05-28 – 2019-05-31 (×4): 40 mg via ORAL
  Filled 2019-05-28 (×6): qty 1

## 2019-05-28 MED ORDER — PRAVASTATIN SODIUM 10 MG PO TABS
20.0000 mg | ORAL_TABLET | Freq: Every day | ORAL | Status: DC
  Administered 2019-05-31: 20 mg via ORAL
  Filled 2019-05-28 (×2): qty 2

## 2019-05-28 MED ORDER — SODIUM CHLORIDE 0.45 % IV SOLN
INTRAVENOUS | Status: DC
  Administered 2019-05-28 – 2019-05-29 (×2): via INTRAVENOUS

## 2019-05-28 MED ORDER — MIRTAZAPINE 15 MG PO TABS
15.0000 mg | ORAL_TABLET | Freq: Every day | ORAL | Status: DC
  Administered 2019-05-28 – 2019-05-30 (×2): 15 mg via ORAL
  Filled 2019-05-28 (×4): qty 1

## 2019-05-28 MED ORDER — LACTATED RINGERS IV BOLUS
250.0000 mL | Freq: Once | INTRAVENOUS | Status: AC
  Administered 2019-05-28: 250 mL via INTRAVENOUS

## 2019-05-28 MED ORDER — CEFAZOLIN SODIUM-DEXTROSE 1-4 GM/50ML-% IV SOLN
1.0000 g | Freq: Two times a day (BID) | INTRAVENOUS | Status: AC
  Administered 2019-05-29 – 2019-06-01 (×7): 1 g via INTRAVENOUS
  Filled 2019-05-28 (×7): qty 50

## 2019-05-28 MED ORDER — POLYETHYLENE GLYCOL 3350 17 G PO PACK
17.0000 g | PACK | Freq: Every day | ORAL | Status: DC
  Administered 2019-05-29: 17 g via ORAL
  Filled 2019-05-28: qty 1

## 2019-05-28 MED ORDER — GABAPENTIN 300 MG PO CAPS: 300.0000 mg | ORAL_CAPSULE | Freq: Every evening | ORAL | Status: DC

## 2019-05-28 NOTE — Progress Notes (Addendum)
PROGRESS NOTE    [name] BORIN  NKN:[id number] DOB: 08-09-1940 DOA: 05/26/2019 PCP: Venia Carbon, MD   Brief Narrative:  [name] [name] is Jarman Litton 78 y.o. female with medical history significant of dementia, CAD, CVA, HTN, DM presents to ed with weakness and fever and vomiting.  Pos covid 10/28 at snf pt severely demented and bedbound at baseline due to also h/o cva.  No history can be obtained from pt due to dementia.  Pt found to have uti and covid pna with nml vitals and 02 sats.    Assessment & Plan:   Principal Problem:   UTI (urinary tract infection) Active Problems:   H/O: CVA (cerebrovascular accident)   Diabetes mellitus, type 2 ([place])   CKD (chronic kidney disease)   Alzheimer's disease ([place])   ARF (acute renal failure) ([place])   Pneumonia due to COVID-19 virus   Klebsiella Urinary Tract Infection: pyuria on urinalysis.  Urine and blood cx pending.  She had weakness, N/V, and fever, could be related to this vs COVID.  Continue ceftriaxone -> narrow to ancef Blood cx NGTD and urine cx with klebsiella sensitive to ancef  AKI  Hypernatremia: baseline creatinine 1, bumped to 1.68. Stable today with hypernatremia.  Bolus and continue 1/2 NS Follow renal US  COVID 19 Pneumonia: CXR with hazy bibasilar infiltrates and L basilar subsegmental atelecatasis.  S/p dex.  Will hold additional dex as pt is not hypoxic.  Hold off on remdesivir at this point as well with recorded O2 sats >94%.   Follow CXR 11/8 - patchy density over mid to lower lungs with interval improvement Elevated D dimer -> holding off on high dose lovenox at this time as concern for GI bleed -> none recurrent and negative guiac per EDP - start heparin (d dimer, improved, normal dosing)  COVID-19 Labs  Recent Labs    05/26/19 1534 05/27/19 0037 05/28/19 0340 05/28/19 0348  DDIMER  --  5.33* 3.35*  --   FERRITIN  --   --   --  709*  CRP 17.4* 19.2*  --  9.7*    Lab Results  Component Value Date   SARSCOV2NAA POSITIVE (Keighley Deckman) 05/26/2019    H/o CVA  Dementia: continue to monitor - delirium precautions  T2DM  Hypoglycemia: d/c basal insulin - q4 SSI - encourage PO intake and follow  Concern for GI bleed: hematemesis reported in ED.  Guiac negative per EDP.  Hb stable.  PO PPI.  Follow outpatient.   Med rec updated - ordered meds -> will discuss with daughter as well - she's not sure of some meds  Per med rec, pt not taking ASA, celexa, or synthroid -> will follow TSH Continue statin, namenda, remeron, prilosec - hold gabapentin due to AMS   DVT prophylaxis: SCD Code Status: full  Family Communication: non at bedside - discussed with daughter Disposition Plan: pending   Consultants:   none  Procedures:   none  Antimicrobials:  Anti-infectives (From admission, onward)   Start     Dose/Rate Route Frequency Ordered Stop   05/27/19 1300  cefTRIAXone (ROCEPHIN) 1 g in sodium chloride 0.9 % 100 mL IVPB     1 g 200 mL/hr over 30 Minutes Intravenous Every 24 hours 05/26/19 2207        Subjective: Confused this morning, got ativan   Objective: Vitals:   05/27/19 2046 05/28/19 0533 05/28/19 0546 05/28/19 0805  BP:  115/63  (!) 104/54  Pulse:  73  69  Resp:  20  19  Temp: 98 F (36.7 C) 98 F (36.7 C) 98 F (36.7 C) 97.9 F (36.6 C)  TempSrc: Oral Axillary  Axillary  SpO2:  99%  99%  Weight:      Height:        Intake/Output Summary (Last 24 hours) at 05/28/2019 1429 Last data filed at 05/27/2019 1500 Gross per 24 hour  Intake 697.5 ml  Output -  Net 697.5 ml   Filed Weights   05/26/19 2302  Weight: 59 kg    Examination:  General: No acute distress. Cardiovascular: RRR Lungs: unlabored Abdomen: Soft, nontender, nondistended  Neurological: disoriented, sleepy, moving all extremities Skin: Warm and dry. No rashes or lesions. Extremities: No clubbing or cyanosis. No edema.   Data Reviewed: I have personally reviewed following labs and imaging studies   CBC: Recent Labs  Lab 05/26/19 1121 05/27/19 0037 05/27/19 1541 05/28/19 0340  WBC 7.6 7.6  --  10.9*  NEUTROABS 6.0 5.9  --  8.4*  HGB 14.6 13.8 14.2 12.9  HCT 44.2 44.1 43.4 40.0  MCV 83.6 89.8  --  86.4  PLT 261 300  --  321   Basic Metabolic Panel: Recent Labs  Lab 05/26/19 1121 05/27/19 0037 05/28/19 0340  NA 145 151* 148*  K 3.6 4.5 3.9  CL 106 110 112*  CO2 24 22 23   GLUCOSE 300* 381* 108*  BUN 54* 53* 57*  CREATININE 1.68* 1.64* 1.82*  CALCIUM 8.9 9.1 8.7*   GFR: Estimated Creatinine Clearance: 20.1 mL/min (Siyona Coto) (by C-G formula based on SCr of 1.82 mg/dL (H)). Liver Function Tests: Recent Labs  Lab 05/26/19 1121 05/27/19 0037 05/28/19 0340  AST 34 29 23  ALT 21 19 19   ALKPHOS 72 76 69  BILITOT 0.9 0.9 0.8  PROT 7.4 7.1 6.8  ALBUMIN 3.1* 2.9* 2.9*   No results for input(s): LIPASE, AMYLASE in the last 168 hours. No results for input(s): AMMONIA in the last 168 hours. Coagulation Profile: No results for input(s): INR, PROTIME in the last 168 hours. Cardiac Enzymes: No results for input(s): CKTOTAL, CKMB, CKMBINDEX, TROPONINI in the last 168 hours. BNP (last 3 results) No results for input(s): PROBNP in the last 8760 hours. HbA1C: Recent Labs    05/26/19 2235  HGBA1C 11.6*   CBG: Recent Labs  Lab 05/27/19 2113 05/27/19 2146 05/27/19 2218 05/28/19 0743 05/28/19 1204  GLUCAP 58* 56* 95 147* 154*   Lipid Profile: No results for input(s): CHOL, HDL, LDLCALC, TRIG, CHOLHDL, LDLDIRECT in the last 72 hours. Thyroid Function Tests: No results for input(s): TSH, T4TOTAL, FREET4, T3FREE, THYROIDAB in the last 72 hours. Anemia Panel: Recent Labs    05/28/19 0348  FERRITIN 709*   Sepsis Labs: Recent Labs  Lab 05/26/19 1121 05/26/19 1534  PROCALCITON  --  <0.10  LATICACIDVEN 2.9* 1.5    Recent Results (from the past 240 hour(s))  Urine culture     Status: Abnormal   Collection Time: 05/26/19 11:21 AM   Specimen: Urine, Catheterized   Result Value Ref Range Status   Specimen Description   Final    URINE, CATHETERIZED Performed at [facility], [address] [place], [address] [place]    Special Requests   Final    NONE Performed at [facility], [address] [place], [address] [place]    Culture >=100,000 COLONIES/mL KLEBSIELLA PNEUMONIAE (Kaymarie Wynn)  Final   Report Status 05/28/2019 FINAL  Final   Organism ID, Bacteria KLEBSIELLA  PNEUMONIAE (Arista Kettlewell)  Final      Susceptibility   Klebsiella pneumoniae - MIC*    AMPICILLIN >=32 RESISTANT Resistant     CEFAZOLIN <=4 SENSITIVE Sensitive     CEFTRIAXONE <=1 SENSITIVE Sensitive     CIPROFLOXACIN <=0.25 SENSITIVE Sensitive     GENTAMICIN <=1 SENSITIVE Sensitive     IMIPENEM <=0.25 SENSITIVE Sensitive     NITROFURANTOIN 32 SENSITIVE Sensitive     TRIMETH/SULFA <=20 SENSITIVE Sensitive     AMPICILLIN/SULBACTAM 4 SENSITIVE Sensitive     PIP/TAZO <=4 SENSITIVE Sensitive     Extended ESBL NEGATIVE Sensitive     * >=100,000 COLONIES/mL KLEBSIELLA PNEUMONIAE  Blood culture (routine x 2)     Status: None (Preliminary result)   Collection Time: 05/26/19 11:21 AM   Specimen: BLOOD  Result Value Ref Range Status   Specimen Description BLOOD L FA  Final   Special Requests   Final    BOTTLES DRAWN AEROBIC AND ANAEROBIC Blood Culture adequate volume   Culture   Final    NO GROWTH 2 DAYS Performed at [facility], [address], [place], [place] [address]    Report Status PENDING  Incomplete  SARS CORONAVIRUS 2 (TAT 6-24 HRS) Nasopharyngeal     Status: Abnormal   Collection Time: 05/26/19 11:31 AM   Specimen: Nasopharyngeal  Result Value Ref Range Status   SARS Coronavirus 2 POSITIVE (Cadance Raus) NEGATIVE Final    Comment: RESULT CALLED TO, READ BACK BY AND VERIFIED WITH: [name] 1820 05/26/2019 [name] (NOTE) SARS-CoV-2 target nucleic acids are DETECTED. The SARS-CoV-2 RNA is generally detectable in upper and lower respiratory specimens during  the acute phase of infection. Positive results are indicative of active infection with SARS-CoV-2. Clinical  correlation with patient history and other diagnostic information is necessary to determine patient infection status. Positive results do  not rule out bacterial infection or co-infection with other viruses. The expected result is Negative. Fact Sheet for Patients: HairSlick.no Fact Sheet for Healthcare Providers: quierodirigir.com This test is not yet approved or cleared by the [place] FDA and  has been authorized for detection and/or diagnosis of SARS-CoV-2 by FDA under an Emergency Use Authorization (EUA). This EUA will remain  in effect (meaning this test can be used)  for the duration of the COVID-19 declaration under Section 564(b)(1) of the Act, 21 U.S.C. section 360bbb-3(b)(1), unless the authorization is terminated or revoked sooner. Performed at [facility], [address] [place], [place] [address]   Blood culture (routine x 2)     Status: None (Preliminary result)   Collection Time: 05/26/19  4:24 PM   Specimen: BLOOD  Result Value Ref Range Status   Specimen Description BLOOD BLOOD LEFT FOREARM  Final   Special Requests   Final    BOTTLES DRAWN AEROBIC AND ANAEROBIC Blood Culture adequate volume   Culture   Final    NO GROWTH 2 DAYS Performed at [facility], [address], [place], [place] [address]    Report Status PENDING  Incomplete         Radiology Studies: Dg Chest Port 1 View  Result Date: 05/28/2019 CLINICAL DATA:  COVID-19 positive. EXAM: PORTABLE CHEST 1 VIEW COMPARISON:  05/26/2019 FINDINGS: Patient slightly rotated to the right as the lungs are adequately inflated. Subtle patchy density over the mid to lower lungs with slight interval improvement. No definite effusion. Mild stable cardiomegaly. Remainder of the exam is unchanged. IMPRESSION: Subtle patchy density  over  the mid to lower lungs with interval improvement. Electronically Signed   By: Elberta Fortisaniel  Boyle M.D.   On: 05/28/2019 09:02        Scheduled Meds: . heparin injection (subcutaneous)  5,000 Units Subcutaneous Q8H  . insulin aspart  0-9 Units Subcutaneous Q4H  . pantoprazole (PROTONIX) IV  40 mg Intravenous Q12H  . sodium chloride flush  3 mL Intravenous Q12H  . vitamin C  500 mg Oral Daily  . zinc sulfate  220 mg Oral Daily   Continuous Infusions: . sodium chloride    . sodium chloride    . cefTRIAXone (ROCEPHIN)  IV 1 g (05/28/19 1350)  . lactated ringers       LOS: 2 days    Time spent: over 30 min    Lacretia Nicksaldwell Powell, MD [facility] Pager AMION  If 7PM-7AM, please contact night-coverage [email or web address] Password [email or web address] 05/28/2019, 2:29 PM

## 2019-05-28 NOTE — Progress Notes (Signed)
Pharmacy Antibiotic Note  [name] [name] is a 78 y.o. female admitted on 05/26/2019 with COVID-19 pneumonia.  Pharmacy has been consulted for cefazolin dosing for UTI.  Plan: Cefazolin 1g IV q12h - start 11/9 since received ceftriaxone today Monitor renal function and adjust as needed F/u duration of therapy, transition to PO when appropriate  Height: 5\' 2"  (157.5 cm) Weight: 130 lb 1.6 oz (59 kg) IBW/kg (Calculated) : 50.1  Temp (24hrs), Avg:98 F (36.7 C), Min:97.9 F (36.6 C), Max:98 F (36.7 C)  Recent Labs  Lab 05/26/19 1121 05/26/19 1534 05/27/19 0037 05/28/19 0340  WBC 7.6  --  7.6 10.9*  CREATININE 1.68*  --  1.64* 1.82*  LATICACIDVEN 2.9* 1.5  --   --     Estimated Creatinine Clearance: 20.1 mL/min (A) (by C-G formula based on SCr of 1.82 mg/dL (H)).    Allergies  Allergen Reactions  . Fexofenadine Hcl Rash    Antimicrobials this admission: 11/6 Ceftriaxone >> 11/8 11/9 Cefazolin >>  Dose adjustments this admission:  Microbiology results: 11/6 COVID+ 11/6 BCx: ngtd 11/6 UCx: >100k Klebsiella pneumonia (R amp only)  Thank you for allowing pharmacy to be a part of this patient's care.  Peggyann Juba, PharmD, BCPS Pharmacy: [phone] 05/28/2019 2:54 PM

## 2019-05-28 NOTE — Progress Notes (Signed)
Tried to reach daughter, No answer. Went straight to VM

## 2019-05-29 LAB — MAGNESIUM: Magnesium: 1.6 mg/dL — ABNORMAL LOW (ref 1.7–2.4)

## 2019-05-29 LAB — GLUCOSE, CAPILLARY
Glucose-Capillary: 116 mg/dL — ABNORMAL HIGH (ref 70–99)
Glucose-Capillary: 122 mg/dL — ABNORMAL HIGH (ref 70–99)
Glucose-Capillary: 148 mg/dL — ABNORMAL HIGH (ref 70–99)
Glucose-Capillary: 152 mg/dL — ABNORMAL HIGH (ref 70–99)
Glucose-Capillary: 153 mg/dL — ABNORMAL HIGH (ref 70–99)
Glucose-Capillary: 171 mg/dL — ABNORMAL HIGH (ref 70–99)
Glucose-Capillary: 176 mg/dL — ABNORMAL HIGH (ref 70–99)
Glucose-Capillary: 56 mg/dL — ABNORMAL LOW (ref 70–99)
Glucose-Capillary: 99 mg/dL (ref 70–99)

## 2019-05-29 LAB — CBC WITH DIFFERENTIAL/PLATELET
Abs Immature Granulocytes: 0.09 10*3/uL — ABNORMAL HIGH (ref 0.00–0.07)
Basophils Absolute: 0 10*3/uL (ref 0.0–0.1)
Basophils Relative: 0 %
Eosinophils Absolute: 0.1 10*3/uL (ref 0.0–0.5)
Eosinophils Relative: 2 %
HCT: 38.1 % (ref 36.0–46.0)
Hemoglobin: 12.2 g/dL (ref 12.0–15.0)
Immature Granulocytes: 1 %
Lymphocytes Relative: 24 %
Lymphs Abs: 1.5 10*3/uL (ref 0.7–4.0)
MCH: 28.4 pg (ref 26.0–34.0)
MCHC: 32 g/dL (ref 30.0–36.0)
MCV: 88.6 fL (ref 80.0–100.0)
Monocytes Absolute: 0.6 10*3/uL (ref 0.1–1.0)
Monocytes Relative: 10 %
Neutro Abs: 4.1 10*3/uL (ref 1.7–7.7)
Neutrophils Relative %: 63 %
Platelets: 253 10*3/uL (ref 150–400)
RBC: 4.3 MIL/uL (ref 3.87–5.11)
RDW: 13.8 % (ref 11.5–15.5)
WBC: 6.5 10*3/uL (ref 4.0–10.5)
nRBC: 0 % (ref 0.0–0.2)

## 2019-05-29 LAB — COMPREHENSIVE METABOLIC PANEL
ALT: 14 U/L (ref 0–44)
AST: 21 U/L (ref 15–41)
Albumin: 2.4 g/dL — ABNORMAL LOW (ref 3.5–5.0)
Alkaline Phosphatase: 66 U/L (ref 38–126)
Anion gap: 11 (ref 5–15)
BUN: 28 mg/dL — ABNORMAL HIGH (ref 8–23)
CO2: 20 mmol/L — ABNORMAL LOW (ref 22–32)
Calcium: 8 mg/dL — ABNORMAL LOW (ref 8.9–10.3)
Chloride: 113 mmol/L — ABNORMAL HIGH (ref 98–111)
Creatinine, Ser: 1.19 mg/dL — ABNORMAL HIGH (ref 0.44–1.00)
GFR calc Af Amer: 51 mL/min — ABNORMAL LOW (ref >60)
GFR calc non Af Amer: 44 mL/min — ABNORMAL LOW (ref >60)
Glucose, Bld: 154 mg/dL — ABNORMAL HIGH (ref 70–99)
Potassium: 3.9 mmol/L (ref 3.5–5.1)
Sodium: 144 mmol/L (ref 135–145)
Total Bilirubin: 0.4 mg/dL (ref 0.3–1.2)
Total Protein: 5.7 g/dL — ABNORMAL LOW (ref 6.5–8.1)

## 2019-05-29 LAB — FERRITIN: Ferritin: 524 ng/mL — ABNORMAL HIGH (ref 11–307)

## 2019-05-29 LAB — D-DIMER, QUANTITATIVE: D-Dimer, Quant: 2.61 ug/mL-FEU — ABNORMAL HIGH (ref 0.00–0.50)

## 2019-05-29 LAB — C-REACTIVE PROTEIN: CRP: 9.9 mg/dL — ABNORMAL HIGH (ref <1.0)

## 2019-05-29 LAB — T4, FREE: Free T4: 0.97 ng/dL (ref 0.61–1.12)

## 2019-05-29 MED ORDER — MAGNESIUM SULFATE 2 GM/50ML IV SOLN
2.0000 g | Freq: Once | INTRAVENOUS | Status: AC
  Administered 2019-05-29: 2 g via INTRAVENOUS
  Filled 2019-05-29: qty 50

## 2019-05-29 NOTE — Progress Notes (Signed)
Daughter was able to facetime patient. All questioned answered. Will continue to update as needed

## 2019-05-29 NOTE — Progress Notes (Signed)
Brief Pharmacy Note  Patient is a 78 y/o F with hx of GI bleed BIBEMS from [facility] to [facility] ED on 11/6 c/o coffee ground emesis. Prior to presentation patient had positive COVID-19 test and was being treated for UTI. Patient was subsequently transferred to [place] where she remains admitted at this time. Urine culture from ED visit has resulted >100k colonies/mL K pneumoniae (ampicillin resistant). Per chart, She has received 3 days of ceftriaxone which was narrowed to cefazolin with plan for 7 day duration. Considering patient is being appropriately treated, no further intervention warranted at this time.   [place] Resident 29 May 2019

## 2019-05-29 NOTE — Progress Notes (Signed)
Pharmacy Antibiotic Note  CHRYSTEL BAREFIELD is a 78 y.o. female admitted on 05/26/2019 with COVID-19 pneumonia.  She is on D4 of abx now for UTI. We will do 7d per Dr. Florene Glen. Stop date added  Plan: Cefazolin 1g IV q12h until 11/12 Rx signs off  Height: 5\' 2"  (157.5 cm) Weight: 130 lb 1.6 oz (59 kg) IBW/kg (Calculated) : 50.1  Temp (24hrs), Avg:99.2 F (37.3 C), Min:99 F (37.2 C), Max:99.5 F (37.5 C)  Recent Labs  Lab 05/26/19 1121 05/26/19 1534 05/27/19 0037 05/28/19 0340 05/29/19 0830  WBC 7.6  --  7.6 10.9* 6.5  CREATININE 1.68*  --  1.64* 1.82* 1.19*  LATICACIDVEN 2.9* 1.5  --   --   --     Estimated Creatinine Clearance: 30.8 mL/min (A) (by C-G formula based on SCr of 1.19 mg/dL (H)).    Allergies  Allergen Reactions  . Fexofenadine Hcl Rash    Antimicrobials this admission: 11/6 Ceftriaxone >> 11/8 11/9 Cefazolin >>11/12  Dose adjustments this admission:  Microbiology results: 11/6 COVID+ 11/6 BCx: ngtd 11/6 UCx: >100k Klebsiella pneumonia (R amp only)  Onnie Boer, PharmD, BCIDP, AAHIVP, CPP Infectious Disease Pharmacist 05/29/2019 2:47 PM

## 2019-05-29 NOTE — Progress Notes (Addendum)
PROGRESS NOTE    [name] [name]  ZOX:[id number] DOB: 1941/06/23 DOA: 05/26/2019 PCP: Karie Schwalbe, MD   Brief Narrative:  [name] [name] is [name] [name] 78 y.o. female with medical history significant of dementia, CAD, CVA, HTN, DM presents to ed with weakness and fever and vomiting.  Pos covid 10/28 at snf pt severely demented and bedbound at baseline due to also h/o cva.  No history can be obtained from pt due to dementia.  Pt found to have uti and covid pna with nml vitals and 02 sats.    Assessment & Plan:   Principal Problem:   UTI (urinary tract infection) Active Problems:   H/O: CVA (cerebrovascular accident)   Diabetes mellitus, type 2 (HCC)   CKD (chronic kidney disease)   Alzheimer's disease (HCC)   ARF (acute renal failure) (HCC)   Pneumonia due to COVID-19 virus   Klebsiella Urinary Tract Infection: pyuria on urinalysis.  Urine and blood cx pending.  She had weakness, N/V, and fever, could be related to this vs COVID.  Continue ceftriaxone (11/6-11/8) -> narrow to ancef (11/9 - present) - plan for 7 day course Blood cx NGTD and urine cx with klebsiella sensitive to ancef  AKI  Hypernatremia: baseline creatinine 1, Peaked to 1.82. Creatinine improved today to 1.19. Continue IVF Hypernatremia improving as well CT abdomen/pelvis without evidence of hydro  COVID 19 Pneumonia: CXR with hazy bibasilar infiltrates and L basilar subsegmental atelecatasis.  S/p dex.  Will hold additional dex as pt is not hypoxic.  Hold off on remdesivir at this point as well with recorded O2 sats >94%.   Follow CXR 11/8 - patchy density over mid to lower lungs with interval improvement Elevated D dimer -> holding off on high dose lovenox at this time as concern for GI bleed -> none recurrent and negative guiac per EDP. Continue prophylactic heparin D dimer, ferritin, and CRP improving  COVID-19 Labs  Recent Labs    05/27/19 0037 05/28/19 0340 05/28/19 0348 05/29/19 0830  DDIMER 5.33*  3.35*  --  2.61*  FERRITIN  --   --  709* 524*  CRP 19.2*  --  9.7* 9.9*    Lab Results  Component Value Date   SARSCOV2NAA POSITIVE ([name]) 05/26/2019    Acute Metabolic Encephalopathy  H/o CVA  Dementia:  continue to monitor delirium precautions apparently pt was more alert and interactive with nursing last night.  She ate and took meds for PM nursing.  Hasn't done this for daytime nurse yet.  For me, she had some incomprehensible speech, but otherwise did not cooperate with exam or answer questions/follow commands today.  Will follow throughout the day and discuss with daughter.   T2DM  Hypoglycemia: d/c basal insulin - q4 SSI - encourage PO intake and follow  Concern for GI bleed: hematemesis reported in ED.  Guiac negative per EDP.  Hb stable.  PO PPI.  Follow outpatient.   Hypomagnesemia: replace and follow  Med rec updated - ordered meds -> will discuss with daughter as well - she's not sure of some meds  Per med rec, pt not taking ASA, celexa, or synthroid -> will follow TSH and add on free t4 Continue statin, namenda, remeron, prilosec - hold gabapentin due to AMS   DVT prophylaxis: SCD Code Status: full  Family Communication: non at bedside - discussed with daughter 11/9 Disposition Plan: pending   Consultants:   none  Procedures:   none  Antimicrobials:  Anti-infectives (From admission, onward)  Start     Dose/Rate Route Frequency Ordered Stop   05/29/19 1400  ceFAZolin (ANCEF) IVPB 1 g/50 mL premix     1 g 100 mL/hr over 30 Minutes Intravenous Every 12 hours 05/28/19 1453     05/27/19 1300  cefTRIAXone (ROCEPHIN) 1 g in sodium chloride 0.9 % 100 mL IVPB  Status:  Discontinued     1 g 200 mL/hr over 30 Minutes Intravenous Every 24 hours 05/26/19 2207 05/28/19 1431      Subjective: Says incomprehensible words this AM during my exam, but otherwise doesn't interact [name] lot  Objective: Vitals:   05/28/19 1555 05/28/19 2024 05/29/19 0400 05/29/19 0800   BP: (!) 104/56 136/62 (!) 144/86   Pulse: 74 80 86   Resp: 18     Temp: 99 F (37.2 C) 99.5 F (37.5 C)  99.1 F (37.3 C)  TempSrc: Axillary Axillary  Axillary  SpO2: 98% 100% 98%   Weight:      Height:        Intake/Output Summary (Last 24 hours) at 05/29/2019 1428 Last data filed at 05/29/2019 0500 Gross per 24 hour  Intake 1366.67 ml  Output -  Net 1366.67 ml   Filed Weights   05/26/19 2302  Weight: 59 kg    Examination:  General: No acute distress. Cardiovascular: RRR Lungs: unlabored, no increased WOB Abdomen: Soft, nontender, nondistended Neurological: Disoriented, says some incomprehensible words and moves all extremities.  Skin: Warm and dry. No rashes or lesions. Extremities: No clubbing or cyanosis. No edema.   Data Reviewed: I have personally reviewed following labs and imaging studies  CBC: Recent Labs  Lab 05/26/19 1121 05/27/19 0037 05/27/19 1541 05/28/19 0340 05/29/19 0830  WBC 7.6 7.6  --  10.9* 6.5  NEUTROABS 6.0 5.9  --  8.4* 4.1  HGB 14.6 13.8 14.2 12.9 12.2  HCT 44.2 44.1 43.4 40.0 38.1  MCV 83.6 89.8  --  86.4 88.6  PLT 261 300  --  321 253   Basic Metabolic Panel: Recent Labs  Lab 05/26/19 1121 05/27/19 0037 05/28/19 0340 05/29/19 0830  NA 145 151* 148* 144  K 3.6 4.5 3.9 3.9  CL 106 110 112* 113*  CO2 24 22 23  20*  GLUCOSE 300* 381* 108* 154*  BUN 54* 53* 57* 28*  CREATININE 1.68* 1.64* 1.82* 1.19*  CALCIUM 8.9 9.1 8.7* 8.0*  MG  --   --   --  1.6*   GFR: Estimated Creatinine Clearance: 30.8 mL/min ([name]) (by C-G formula based on SCr of 1.19 mg/dL (H)). Liver Function Tests: Recent Labs  Lab 05/26/19 1121 05/27/19 0037 05/28/19 0340 05/29/19 0830  AST 34 29 23 21   ALT 21 19 19 14   ALKPHOS 72 76 69 66  BILITOT 0.9 0.9 0.8 0.4  PROT 7.4 7.1 6.8 5.7*  ALBUMIN 3.1* 2.9* 2.9* 2.4*   No results for input(s): LIPASE, AMYLASE in the last 168 hours. No results for input(s): AMMONIA in the last 168 hours. Coagulation  Profile: No results for input(s): INR, PROTIME in the last 168 hours. Cardiac Enzymes: No results for input(s): CKTOTAL, CKMB, CKMBINDEX, TROPONINI in the last 168 hours. BNP (last 3 results) No results for input(s): PROBNP in the last 8760 hours. HbA1C: Recent Labs    05/26/19 2235  HGBA1C 11.6*   CBG: Recent Labs  Lab 05/28/19 1953 05/29/19 0024 05/29/19 0426 05/29/19 0752 05/29/19 1131  GLUCAP 116* 99 153* 148* 176*   Lipid Profile: No results for input(s): CHOL,  HDL, LDLCALC, TRIG, CHOLHDL, LDLDIRECT in the last 72 hours. Thyroid Function Tests: Recent Labs    05/28/19 0500  TSH 5.120*   Anemia Panel: Recent Labs    05/28/19 0348 05/29/19 0830  FERRITIN 709* 524*   Sepsis Labs: Recent Labs  Lab 05/26/19 1121 05/26/19 1534  PROCALCITON  --  <0.10  LATICACIDVEN 2.9* 1.5    Recent Results (from the past 240 hour(s))  Urine culture     Status: Abnormal   Collection Time: 05/26/19 11:21 AM   Specimen: Urine, Catheterized  Result Value Ref Range Status   Specimen Description   Final    URINE, CATHETERIZED Performed at [facility], [address] [place], [place] [address]    Special Requests   Final    NONE Performed at [facility], [address] [place], [place] [address]    Culture >=100,000 COLONIES/mL KLEBSIELLA PNEUMONIAE (Skarleth Delmonico)  Final   Report Status 05/28/2019 FINAL  Final   Organism ID, Bacteria KLEBSIELLA PNEUMONIAE (Raianna Slight)  Final      Susceptibility   Klebsiella pneumoniae - MIC*    AMPICILLIN >=32 RESISTANT Resistant     CEFAZOLIN <=4 SENSITIVE Sensitive     CEFTRIAXONE <=1 SENSITIVE Sensitive     CIPROFLOXACIN <=0.25 SENSITIVE Sensitive     GENTAMICIN <=1 SENSITIVE Sensitive     IMIPENEM <=0.25 SENSITIVE Sensitive     NITROFURANTOIN 32 SENSITIVE Sensitive     TRIMETH/SULFA <=20 SENSITIVE Sensitive     AMPICILLIN/SULBACTAM 4 SENSITIVE Sensitive     PIP/TAZO <=4 SENSITIVE Sensitive     Extended ESBL NEGATIVE Sensitive      * >=100,000 COLONIES/mL KLEBSIELLA PNEUMONIAE  Blood culture (routine x 2)     Status: None (Preliminary result)   Collection Time: 05/26/19 11:21 AM   Specimen: BLOOD  Result Value Ref Range Status   Specimen Description BLOOD L FA  Final   Special Requests   Final    BOTTLES DRAWN AEROBIC AND ANAEROBIC Blood Culture adequate volume   Culture   Final    NO GROWTH 3 DAYS Performed at [facility], [address], [place], [place] [address]    Report Status PENDING  Incomplete  SARS CORONAVIRUS 2 (TAT 6-24 HRS) Nasopharyngeal     Status: Abnormal   Collection Time: 05/26/19 11:31 AM   Specimen: Nasopharyngeal  Result Value Ref Range Status   SARS Coronavirus 2 POSITIVE (Julionna Marczak) NEGATIVE Final    Comment: RESULT CALLED TO, READ BACK BY AND VERIFIED WITH: [name] 1820 05/26/2019 [name] (NOTE) SARS-CoV-2 target nucleic acids are DETECTED. The SARS-CoV-2 RNA is generally detectable in upper and lower respiratory specimens during the acute phase of infection. Positive results are indicative of active infection with SARS-CoV-2. Clinical  correlation with patient history and other diagnostic information is necessary to determine patient infection status. Positive results do  not rule out bacterial infection or co-infection with other viruses. The expected result is Negative. Fact Sheet for Patients: HairSlick.no Fact Sheet for Healthcare Providers: quierodirigir.com This test is not yet approved or cleared by the [place] FDA and  has been authorized for detection and/or diagnosis of SARS-CoV-2 by FDA under an Emergency Use Authorization (EUA). This EUA will remain  in effect (meaning this test can be used)  for the duration of the COVID-19 declaration under Section 564(b)(1) of the Act, 21 U.S.C. section 360bbb-3(b)(1), unless the authorization is terminated or revoked sooner. Performed at [facility], [address], [place],  [place] [address]   Blood culture (routine x 2)     Status: None (Preliminary result)   Collection Time: 05/26/19  4:24 PM   Specimen: BLOOD  Result Value Ref Range Status   Specimen Description BLOOD BLOOD LEFT FOREARM  Final   Special Requests   Final    BOTTLES DRAWN AEROBIC AND ANAEROBIC Blood Culture adequate volume   Culture   Final    NO GROWTH 3 DAYS Performed at [facility], [address] [place], [place] [address]    Report Status PENDING  Incomplete         Radiology Studies: Ct Abdomen Pelvis Wo Contrast  Result Date: 05/28/2019 CLINICAL DATA:  Renal failure, acute(kidneyinjury) Limited mobility, AMS, unable to raise arms/follow direction, immobilization used EXAM: CT ABDOMEN AND PELVIS WITHOUT CONTRAST TECHNIQUE: Multidetector CT imaging of the abdomen and pelvis was performed following the standard protocol without IV contrast. COMPARISON:  CT abdomen pelvis FINDINGS: Study is significantly degraded by motion artifact and lack of IV contrast. Lower chest: Coronary artery calcification. Normal heart size no pericardial or pleural effusion. There are bilateral multifocal pulmonary opacities suspicious for infection. Hepatobiliary: No definite focal liver lesion. Small amount of layering gallstones. No evidence of gallbladder wall thickening. Pancreas: Unremarkable Spleen: Normal size. Probable adjacent small splenule. Adrenals/Urinary Tract: Normal appearance of the bilateral renal glands. Mild atrophy of the right kidney. No evidence of renal calculi no hydronephrosis. No large mass identified. Urinary bladder is unremarkable. Stomach/Bowel: Stomach is within normal limits. Appendix appears normal. No evidence of bowel wall thickening, distention, or inflammatory changes. Colonic diverticula without evidence of diverticulitis. Vascular/Lymphatic: Severe aortoiliac atherosclerotic calcification. No evidence of aneurysm. Vascular  patency cannot be assessed in the absence of IV contrast. Reproductive: No adnexal masses. Other: No abdominal wall hernia or abnormality. No abdominopelvic ascites. Musculoskeletal: No acute or significant osseous findings. IMPRESSION: 1. Study is significantly degraded by motion artifact and lack of IV contrast. 2. Bilateral multifocal pulmonary opacities in the visualized lung bases, suspicious for infection. 3. Cholelithiasis without evidence of cholecystitis. 4. Mild atrophy of the right kidney. No renal calculi or hydronephrosis. 5. Severe aortoiliac atherosclerotic calcification. Vascular patency cannot be assessed in the absence of IV contrast. Electronically Signed   By: Emmaline Kluver M.D.   On: 05/28/2019 18:55   Dg Chest Port 1 View  Result Date: 05/28/2019 CLINICAL DATA:  COVID-19 positive. EXAM: PORTABLE CHEST 1 VIEW COMPARISON:  05/26/2019 FINDINGS: Patient slightly rotated to the right as the lungs are adequately inflated. Subtle patchy density over the mid to lower lungs with slight interval improvement. No definite effusion. Mild stable cardiomegaly. Remainder of the exam is unchanged. IMPRESSION: Subtle patchy density over the mid to lower lungs with interval improvement. Electronically Signed   By: Elberta Fortis M.D.   On: 05/28/2019 09:02        Scheduled Meds: . heparin injection (subcutaneous)  5,000 Units Subcutaneous Q8H  . insulin aspart  0-9 Units Subcutaneous Q4H  . memantine  10 mg Oral BID  . mirtazapine  15 mg Oral QHS  . pantoprazole  40 mg Oral BID  . polyethylene glycol  17 g Oral Daily  . pravastatin  20 mg Oral q1800  . sodium chloride flush  3 mL Intravenous Q12H  . vitamin C  500 mg Oral Daily  . zinc sulfate  220 mg Oral Daily   Continuous Infusions: . sodium chloride 100 mL/hr at 05/29/19 0203  . sodium chloride    .  ceFAZolin (ANCEF) IV 1 g (05/29/19 1401)  . magnesium sulfate bolus IVPB       LOS: 3 days    Time spent: over 30 min     Fayrene Helper, MD [facility] Pager AMION  If 7PM-7AM, please contact night-coverage [email or web address] Password [email or web address] 05/29/2019, 2:28 PM

## 2019-05-30 LAB — GLUCOSE, CAPILLARY
Glucose-Capillary: 122 mg/dL — ABNORMAL HIGH (ref 70–99)
Glucose-Capillary: 129 mg/dL — ABNORMAL HIGH (ref 70–99)
Glucose-Capillary: 152 mg/dL — ABNORMAL HIGH (ref 70–99)
Glucose-Capillary: 156 mg/dL — ABNORMAL HIGH (ref 70–99)
Glucose-Capillary: 175 mg/dL — ABNORMAL HIGH (ref 70–99)

## 2019-05-30 LAB — CBC WITH DIFFERENTIAL/PLATELET
Abs Immature Granulocytes: 0.08 10*3/uL — ABNORMAL HIGH (ref 0.00–0.07)
Basophils Absolute: 0 10*3/uL (ref 0.0–0.1)
Basophils Relative: 1 %
Eosinophils Absolute: 0.2 10*3/uL (ref 0.0–0.5)
Eosinophils Relative: 2 %
HCT: 38.5 % (ref 36.0–46.0)
Hemoglobin: 12.4 g/dL (ref 12.0–15.0)
Immature Granulocytes: 1 %
Lymphocytes Relative: 22 %
Lymphs Abs: 1.6 10*3/uL (ref 0.7–4.0)
MCH: 27.9 pg (ref 26.0–34.0)
MCHC: 32.2 g/dL (ref 30.0–36.0)
MCV: 86.5 fL (ref 80.0–100.0)
Monocytes Absolute: 0.5 10*3/uL (ref 0.1–1.0)
Monocytes Relative: 7 %
Neutro Abs: 4.8 10*3/uL (ref 1.7–7.7)
Neutrophils Relative %: 67 %
Platelets: 261 10*3/uL (ref 150–400)
RBC: 4.45 MIL/uL (ref 3.87–5.11)
RDW: 13.5 % (ref 11.5–15.5)
WBC: 7.2 10*3/uL (ref 4.0–10.5)
nRBC: 0 % (ref 0.0–0.2)

## 2019-05-30 LAB — COMPREHENSIVE METABOLIC PANEL
ALT: 13 U/L (ref 0–44)
AST: 20 U/L (ref 15–41)
Albumin: 2.4 g/dL — ABNORMAL LOW (ref 3.5–5.0)
Alkaline Phosphatase: 63 U/L (ref 38–126)
Anion gap: 10 (ref 5–15)
BUN: 14 mg/dL (ref 8–23)
CO2: 21 mmol/L — ABNORMAL LOW (ref 22–32)
Calcium: 7.7 mg/dL — ABNORMAL LOW (ref 8.9–10.3)
Chloride: 106 mmol/L (ref 98–111)
Creatinine, Ser: 0.95 mg/dL (ref 0.44–1.00)
GFR calc Af Amer: >60 mL/min (ref >60)
GFR calc non Af Amer: 57 mL/min — ABNORMAL LOW (ref >60)
Glucose, Bld: 134 mg/dL — ABNORMAL HIGH (ref 70–99)
Potassium: 3.5 mmol/L (ref 3.5–5.1)
Sodium: 137 mmol/L (ref 135–145)
Total Bilirubin: 0.8 mg/dL (ref 0.3–1.2)
Total Protein: 6 g/dL — ABNORMAL LOW (ref 6.5–8.1)

## 2019-05-30 LAB — FERRITIN: Ferritin: 660 ng/mL — ABNORMAL HIGH (ref 11–307)

## 2019-05-30 LAB — D-DIMER, QUANTITATIVE: D-Dimer, Quant: 2.57 ug/mL-FEU — ABNORMAL HIGH (ref 0.00–0.50)

## 2019-05-30 LAB — C-REACTIVE PROTEIN: CRP: 11.5 mg/dL — ABNORMAL HIGH (ref <1.0)

## 2019-05-30 MED ORDER — ENSURE ENLIVE PO LIQD
237.0000 mL | Freq: Two times a day (BID) | ORAL | Status: DC
  Administered 2019-05-31 – 2019-06-01 (×3): 237 mL via ORAL

## 2019-05-30 NOTE — Progress Notes (Signed)
PROGRESS NOTE    [name] CalamitySophie Johnryan Sao [name]  ZOX:[id number] DOB: Nov 22, 1940 DOA: 05/26/2019 PCP: Karie SchwalbeLetvak, Richard I, MD   Brief Narrative:  [name] [name] is [name] [name] 78 y.o. female with medical history significant of dementia, CAD, CVA, HTN, DM presents to ed with weakness and fever and vomiting.  Pos covid 10/28 at snf pt severely demented and bedbound at baseline due to also h/o cva.  No history can be obtained from pt due to dementia.  Pt found to have uti and covid pna with nml vitals and 02 sats.    She was found to have klebsiella UTI.  She's improved on abx.  She also had evidence of COVID 19 pneumonia, but has never had an O2 requirement, so steroids (received 1 dose dex) and remdesivir have not been given.  hospitalization c/b delirium which seems to be slowly improving, but still with poor PO intake.  Discharge pending further improvement in delirium and oral intake.   Assessment & Plan:   Principal Problem:   UTI (urinary tract infection) Active Problems:   H/O: CVA (cerebrovascular accident)   Diabetes mellitus, type 2 (HCC)   CKD (chronic kidney disease)   Alzheimer's disease (HCC)   ARF (acute renal failure) (HCC)   Pneumonia due to COVID-19 virus   Klebsiella Urinary Tract Infection: pyuria on urinalysis.  Urine and blood cx pending.  She had weakness, N/V, and fever, could be related to this vs COVID.  Continue ceftriaxone (11/6-11/8) -> narrow to ancef (11/9 - present) - plan for 7 day course Blood cx NGTD and urine cx with klebsiella sensitive to ancef  AKI   Hypernatremia: baseline creatinine 1, Peaked to 1.82. Resolved CT abdomen/pelvis without evidence of hydro  COVID 19 Pneumonia: CXR with hazy bibasilar infiltrates and L basilar subsegmental atelecatasis.  S/p dex.  Will hold additional dex as pt is not hypoxic.  Hold off on remdesivir at this point as well with recorded O2 sats >94%.   Follow CXR 11/8 - patchy density over mid to lower lungs with interval  improvement Elevated D dimer -> holding off on high dose lovenox at this time as concern for GI bleed -> none recurrent and negative guiac per EDP. Continue prophylactic heparin D dimer, ferritin, and CRP improving  COVID-19 Labs  Recent Labs    05/28/19 0340 05/28/19 0348 05/29/19 0830 05/30/19 0633  DDIMER 3.35*  --  2.61* 2.57*  FERRITIN  --  709* 524* 660*  CRP  --  9.7* 9.9* 11.5*    Lab Results  Component Value Date   SARSCOV2NAA POSITIVE (Trey Gulbranson) 05/26/2019    Acute Metabolic Encephalopathy   H/o CVA   Dementia:  continue to monitor delirium precautions Continue namenda and remeron Gabapentin on hold for AMS Somewhat improved today, had her eyes open and told me "leave me alone".  Per nursing, still with poor oral intake.  Will continue to monitor for improvement.   T2DM   Hypoglycemia: d/c basal insulin - q4 SSI - encourage PO intake and follow  Concern for GI bleed: hematemesis reported in ED.  Guiac negative per EDP.  Hb stable.  PO PPI.  Follow outpatient.   Hypomagnesemia: replace and follow  Subclinical hypothyroidism: follow outpatient  Hyperlipidemia: continue statin  CAD   CVA: continue statin.  For some reason, not on ASA per med rec  GERD: ppi   Per med rec, pt not taking ASA, celexa, or synthroid -> daughter didn't think these medications sounded familiar when I discussed with  her.  DVT prophylaxis: SCD Code Status: full  Family Communication: non at bedside - discussed with daughter 11/10 Disposition Plan: pending improvement in delirium   Consultants:   none  Procedures:   none  Antimicrobials:  Anti-infectives (From admission, onward)   Start     Dose/Rate Route Frequency Ordered Stop   05/29/19 1400  ceFAZolin (ANCEF) IVPB 1 g/50 mL premix     1 g 100 mL/hr over 30 Minutes Intravenous Every 12 hours 05/28/19 1453 06/01/19 2359   05/27/19 1300  cefTRIAXone (ROCEPHIN) 1 g in sodium chloride 0.9 % 100 mL IVPB  Status:  Discontinued      1 g 200 mL/hr over 30 Minutes Intravenous Every 24 hours 05/26/19 2207 05/28/19 1431      Subjective: Asks me to leave her alone  Objective: Vitals:   05/29/19 0800 05/29/19 1939 05/30/19 0400 05/30/19 0759  BP:  (!) 111/30 101/86 103/69  Pulse:  74 85 86  Resp:    18  Temp: 99.1 F (37.3 C) 98.2 F (36.8 C) 98.8 F (37.1 C) 98.6 F (37 C)  TempSrc: Axillary Axillary Axillary Axillary  SpO2:  100% 100% 100%  Weight:      Height:        Intake/Output Summary (Last 24 hours) at 05/30/2019 1413 Last data filed at 05/30/2019 1200 Gross per 24 hour  Intake 600 ml  Output --  Net 600 ml   Filed Weights   05/26/19 2302  Weight: 59 kg    Examination:  General: No acute distress. Cardiovascular: RRR Lungs: unlabored Abdomen: Soft, nontender, nondistended  Neurological: attends to voice, asks me to leave her alone, moving all extremities.  Inconsistently follows commands and answers questions.  Skin: Warm and dry. No rashes or lesions. Extremities: No clubbing or cyanosis. No edema.  Data Reviewed: I have personally reviewed following labs and imaging studies  CBC: Recent Labs  Lab 05/26/19 1121 05/27/19 0037 05/27/19 1541 05/28/19 0340 05/29/19 0830 05/30/19 0633  WBC 7.6 7.6  --  10.9* 6.5 7.2  NEUTROABS 6.0 5.9  --  8.4* 4.1 4.8  HGB 14.6 13.8 14.2 12.9 12.2 12.4  HCT 44.2 44.1 43.4 40.0 38.1 38.5  MCV 83.6 89.8  --  86.4 88.6 86.5  PLT 261 300  --  321 253 629   Basic Metabolic Panel: Recent Labs  Lab 05/26/19 1121 05/27/19 0037 05/28/19 0340 05/29/19 0830 05/30/19 0633  NA 145 151* 148* 144 137  K 3.6 4.5 3.9 3.9 3.5  CL 106 110 112* 113* 106  CO2 24 22 23  20* 21*  GLUCOSE 300* 381* 108* 154* 134*  BUN 54* 53* 57* 28* 14  CREATININE 1.68* 1.64* 1.82* 1.19* 0.95  CALCIUM 8.9 9.1 8.7* 8.0* 7.7*  MG  --   --   --  1.6*  --    GFR: Estimated Creatinine Clearance: 38.6 mL/min (by C-G formula based on SCr of 0.95 mg/dL). Liver Function  Tests: Recent Labs  Lab 05/26/19 1121 05/27/19 0037 05/28/19 0340 05/29/19 0830 05/30/19 0633  AST 34 29 23 21 20   ALT 21 19 19 14 13   ALKPHOS 72 76 69 66 63  BILITOT 0.9 0.9 0.8 0.4 0.8  PROT 7.4 7.1 6.8 5.7* 6.0*  ALBUMIN 3.1* 2.9* 2.9* 2.4* 2.4*   No results for input(s): LIPASE, AMYLASE in the last 168 hours. No results for input(s): AMMONIA in the last 168 hours. Coagulation Profile: No results for input(s): INR, PROTIME in the last 168 hours.  Cardiac Enzymes: No results for input(s): CKTOTAL, CKMB, CKMBINDEX, TROPONINI in the last 168 hours. BNP (last 3 results) No results for input(s): PROBNP in the last 8760 hours. HbA1C: No results for input(s): HGBA1C in the last 72 hours. CBG: Recent Labs  Lab 05/29/19 2103 05/29/19 2352 05/30/19 0408 05/30/19 0758 05/30/19 1155  GLUCAP 122* 129* 156* 122* 175*   Lipid Profile: No results for input(s): CHOL, HDL, LDLCALC, TRIG, CHOLHDL, LDLDIRECT in the last 72 hours. Thyroid Function Tests: Recent Labs    05/28/19 0500 05/29/19 0830  TSH 5.120*  --   FREET4  --  0.97   Anemia Panel: Recent Labs    05/29/19 0830 05/30/19 0633  FERRITIN 524* 660*   Sepsis Labs: Recent Labs  Lab 05/26/19 1121 05/26/19 1534  PROCALCITON  --  <0.10  LATICACIDVEN 2.9* 1.5    Recent Results (from the past 240 hour(s))  Urine culture     Status: Abnormal   Collection Time: 05/26/19 11:21 AM   Specimen: Urine, Catheterized  Result Value Ref Range Status   Specimen Description   Final    URINE, CATHETERIZED Performed at [facility], [address] [place], [place] [address]    Special Requests   Final    NONE Performed at [facility], [address], [place], [place] [address]    Culture >=100,000 COLONIES/mL KLEBSIELLA PNEUMONIAE (Tequilla Cousineau)  Final   Report Status 05/28/2019 FINAL  Final   Organism ID, Bacteria KLEBSIELLA PNEUMONIAE (Jaylani Mcguinn)  Final      Susceptibility   Klebsiella pneumoniae - MIC*     AMPICILLIN >=32 RESISTANT Resistant     CEFAZOLIN <=4 SENSITIVE Sensitive     CEFTRIAXONE <=1 SENSITIVE Sensitive     CIPROFLOXACIN <=0.25 SENSITIVE Sensitive     GENTAMICIN <=1 SENSITIVE Sensitive     IMIPENEM <=0.25 SENSITIVE Sensitive     NITROFURANTOIN 32 SENSITIVE Sensitive     TRIMETH/SULFA <=20 SENSITIVE Sensitive     AMPICILLIN/SULBACTAM 4 SENSITIVE Sensitive     PIP/TAZO <=4 SENSITIVE Sensitive     Extended ESBL NEGATIVE Sensitive     * >=100,000 COLONIES/mL KLEBSIELLA PNEUMONIAE  Blood culture (routine x 2)     Status: None (Preliminary result)   Collection Time: 05/26/19 11:21 AM   Specimen: BLOOD  Result Value Ref Range Status   Specimen Description BLOOD L FA  Final   Special Requests   Final    BOTTLES DRAWN AEROBIC AND ANAEROBIC Blood Culture adequate volume   Culture   Final    NO GROWTH 4 DAYS Performed at [facility], [address], [place], [place] [address]    Report Status PENDING  Incomplete  SARS CORONAVIRUS 2 (TAT 6-24 HRS) Nasopharyngeal     Status: Abnormal   Collection Time: 05/26/19 11:31 AM   Specimen: Nasopharyngeal  Result Value Ref Range Status   SARS Coronavirus 2 POSITIVE (Clemens Lachman) NEGATIVE Final    Comment: RESULT CALLED TO, READ BACK BY AND VERIFIED WITH: [name] 1820 05/26/2019 [name] (NOTE) SARS-CoV-2 target nucleic acids are DETECTED. The SARS-CoV-2 RNA is generally detectable in upper and lower respiratory specimens during the acute phase of infection. Positive results are indicative of active infection with SARS-CoV-2. Clinical  correlation with patient history and other diagnostic information is necessary to determine patient infection status. Positive results do  not rule out bacterial infection or co-infection with other viruses. The expected result is Negative. Fact Sheet for Patients: HairSlick.no Fact Sheet for Healthcare Providers: quierodirigir.com This  test is not yet approved or cleared by the [place] and  has been authorized for detection and/or diagnosis of SARS-CoV-2 by FDA under an Emergency Use Authorization (EUA). This EUA will remain  in effect (meaning this test can be used)  for the duration of the COVID-19 declaration under Section 564(b)(1) of the Act, 21 U.S.C. section 360bbb-3(b)(1), unless the authorization is terminated or revoked sooner. Performed at [facility], [address] [place], [place] [address]   Blood culture (routine x 2)     Status: None (Preliminary result)   Collection Time: 05/26/19  4:24 PM   Specimen: BLOOD  Result Value Ref Range Status   Specimen Description BLOOD BLOOD LEFT FOREARM  Final   Special Requests   Final    BOTTLES DRAWN AEROBIC AND ANAEROBIC Blood Culture adequate volume   Culture   Final    NO GROWTH 4 DAYS Performed at [facility], [address] [place], [place] [address]    Report Status PENDING  Incomplete         Radiology Studies: Ct Abdomen Pelvis Wo Contrast  Result Date: 05/28/2019 CLINICAL DATA:  Renal failure, acute(kidneyinjury) Limited mobility, AMS, unable to raise arms/follow direction, immobilization used EXAM: CT ABDOMEN AND PELVIS WITHOUT CONTRAST TECHNIQUE: Multidetector CT imaging of the abdomen and pelvis was performed following the standard protocol without IV contrast. COMPARISON:  CT abdomen pelvis FINDINGS: Study is significantly degraded by motion artifact and lack of IV contrast. Lower chest: Coronary artery calcification. Normal heart size no pericardial or pleural effusion. There are bilateral multifocal pulmonary opacities suspicious for infection. Hepatobiliary: No definite focal liver lesion. Small amount of layering gallstones. No evidence of gallbladder wall thickening. Pancreas: Unremarkable Spleen: Normal size. Probable adjacent small splenule. Adrenals/Urinary Tract: Normal appearance of the bilateral renal glands.  Mild atrophy of the right kidney. No evidence of renal calculi no hydronephrosis. No large mass identified. Urinary bladder is unremarkable. Stomach/Bowel: Stomach is within normal limits. Appendix appears normal. No evidence of bowel wall thickening, distention, or inflammatory changes. Colonic diverticula without evidence of diverticulitis. Vascular/Lymphatic: Severe aortoiliac atherosclerotic calcification. No evidence of aneurysm. Vascular patency cannot be assessed in the absence of IV contrast. Reproductive: No adnexal masses. Other: No abdominal wall hernia or abnormality. No abdominopelvic ascites. Musculoskeletal: No acute or significant osseous findings. IMPRESSION: 1. Study is significantly degraded by motion artifact and lack of IV contrast. 2. Bilateral multifocal pulmonary opacities in the visualized lung bases, suspicious for infection. 3. Cholelithiasis without evidence of cholecystitis. 4. Mild atrophy of the right kidney. No renal calculi or hydronephrosis. 5. Severe aortoiliac atherosclerotic calcification. Vascular patency cannot be assessed in the absence of IV contrast. Electronically Signed   By: Emmaline Kluver M.D.   On: 05/28/2019 18:55        Scheduled Meds:  heparin injection (subcutaneous)  5,000 Units Subcutaneous Q8H   insulin aspart  0-9 Units Subcutaneous Q4H   memantine  10 mg Oral BID   mirtazapine  15 mg Oral QHS   pantoprazole  40 mg Oral BID   polyethylene glycol  17 g Oral Daily   pravastatin  20 mg Oral q1800   sodium chloride flush  3 mL Intravenous Q12H   vitamin C  500 mg Oral Daily   zinc sulfate  220 mg Oral Daily   Continuous Infusions:  sodium chloride Stopped (05/30/19 1248)   sodium chloride      ceFAZolin (ANCEF) IV 1 g (05/30/19 1334)  LOS: 4 days    Time spent: over 30 min    Lacretia Nicks, MD [facility] Pager AMION  If 7PM-7AM, please contact night-coverage [email or web address] Password [email or web address] 05/30/2019,  2:13 PM

## 2019-05-30 NOTE — Progress Notes (Signed)
Multiple attempts by multiple staff members to help pt eat lunch unsuccessful, pt refused to open mouth and yelled at staff to 'let me sleep', 'you eat it', and 'leave me alone'.  Pt becoming agitated with every attempt, will let pt rest, call light is within reach, bed is in lowest position. 05/30/2019 [place], RN

## 2019-05-30 NOTE — Evaluation (Signed)
Clinical/Bedside Swallow Evaluation Patient Details  Name: [name] [name] MRN: [id number] Date of Birth: March 19, 1941  Today's Date: 05/30/2019 Time: SLP Start Time (ACUTE ONLY): 0840 SLP Stop Time (ACUTE ONLY): 0915 SLP Time Calculation (min) (ACUTE ONLY): 35 min  Past Medical History:  Past Medical History:  Diagnosis Date  . Allergic rhinitis   . CAD (coronary artery disease)   . Carotid artery occlusion 02/15/2009  . CVA (cerebral infarction)   . Dementia (HCC)   . Diabetes mellitus   . Gastritis   . GERD (gastroesophageal reflux disease)   . Heart murmur   . Hematemesis   . Hemiplegia (HCC)   . HLD (hyperlipidemia)   . Hypertension   . Hypertensive chronic kidney disease   . Macular degeneration   . Persistent mood (affective) disorder, unspecified (HCC)   . Stroke (HCC)   . Thyroid disease    hypothyroidism  . Ulcer of esophagus without bleeding    Past Surgical History:  Past Surgical History:  Procedure Laterality Date  . APPENDECTOMY    . ESOPHAGOGASTRODUODENOSCOPY Left 10/17/2015   Procedure: ESOPHAGOGASTRODUODENOSCOPY (EGD);  Surgeon: Scot Jun, MD;  Location[facility] ENDOSCOPY;  Service: Endoscopy;  Laterality: Left;  . ESOPHAGOGASTRODUODENOSCOPY N/A 08/22/2018   Procedure: ESOPHAGOGASTRODUODENOSCOPY (EGD);  Surgeon: [name], MD;  Location: [facility] ENDOSCOPY;  Service: Gastroenterology;  Laterality: N/A;  . TONSILLECTOMY     HPI:  Pt is a 78 yo female admitted from SNF with weakness, fever, and vomiting (coffee-ground) after testing positive for COVID-19 on 10/28. No prior SLP evaluation in chart but esophagram 2010 showed normal swallow function but with HH, marked GER, and tertiary contractions. PMH: GERD, dementia, CAD, CVA, HTN, DM, esophageal ulcer, thyroid disease, macular degeneration, HTN, HLD   Assessment / Plan / Recommendation Clinical Impression  Pt demonstrates poor appetite, limited intake, but no immediate signs of aspiration. She  only accepted one sip of thin liquids, three sips of nectar thick liquids and 2 oz of ice cream via 1/4 teaspoon quantitites. Other than mild lingual pumping, no significant difficulty noted. Pt is a poor historian, claims she has teeth and can chew but she SLP pointed out she has no teeth she says "you gonna get me some?" Best diet at this time is puree (dys 1) and thin liquids with basic precautions, upright posture, slow rate given hx of esophageal dysphagia. No SLP f/u needed at this time. Will sign off.  SLP Visit Diagnosis: Dysphagia, oral phase (R13.11)    Aspiration Risk  Mild aspiration risk;Risk for inadequate nutrition/hydration    Diet Recommendation Dysphagia 1 (Puree);Thin liquid   Liquid Administration via: Cup;Straw Medication Administration: Crushed with puree Supervision: Staff to assist with self feeding Compensations: Slow rate;Small sips/bites Postural Changes: Seated upright at 90 degrees;Remain upright for at least 30 minutes after po intake    Other  Recommendations Other Recommendations: Have oral suction available   Follow up Recommendations None;Skilled Nursing facility      Frequency and Duration            Prognosis        Swallow Study   General HPI: Pt is a 78 yo female admitted from SNF with weakness, fever, and vomiting (coffee-ground) after testing positive for COVID-19 on 10/28. No prior SLP evaluation in chart but esophagram 2010 showed normal swallow function but with HH, marked GER, and tertiary contractions. PMH: GERD, dementia, CAD, CVA, HTN, DM, esophageal ulcer, thyroid disease, macular degeneration, HTN, HLD Type of Study: Bedside Swallow  Evaluation Previous Swallow Assessment: esophagram see HPI Diet Prior to this Study: NPO Temperature Spikes Noted: No Respiratory Status: Room air History of Recent Intubation: No Behavior/Cognition: Alert;Cooperative;Pleasant mood Oral Cavity Assessment: Within Functional Limits Oral Care Completed by  SLP: No Oral Cavity - Dentition: Edentulous Self-Feeding Abilities: Total assist Patient Positioning: Upright in bed Baseline Vocal Quality: Normal(very high pitched) Volitional Cough: Cognitively unable to elicit(" I have trouble coughing") Volitional Swallow: Unable to elicit    Oral/Motor/Sensory Function Overall Oral Motor/Sensory Function: Within functional limits   Ice Chips Ice chips: Not tested   Thin Liquid Thin Liquid: Within functional limits Presentation: Straw    Nectar Thick Nectar Thick Liquid: Within functional limits Presentation: Straw   Honey Thick Honey Thick Liquid: Not tested   Puree Puree: Within functional limits Presentation: Spoon   Solid     Solid: Not tested      Keila Turan, Katherene Ponto 05/30/2019,9:50 AM

## 2019-05-30 NOTE — Plan of Care (Signed)
Pt A/Ox0

## 2019-05-30 NOTE — Progress Notes (Addendum)
Attempted to feed pt dinner, pt took 3 small bites of ice cream and started spitting food onto staff and blankets, yelling 'leave me alone' when RN cleaned pt and gown.  Checked pt pad and linen, all was dry.  Pt cleaned, call light is within reach, bed is in lowest position. 05/30/2019 [place], RN

## 2019-05-30 NOTE — Progress Notes (Addendum)
Attempted to call pt daughter [name] on the phone twice, once at 1259 and second attempt made at 1337 to [facility] with pt.  No answer, went to voicemail after 2 rings. 05/30/2019 [place], RN

## 2019-05-30 NOTE — Progress Notes (Signed)
Initial Nutrition Assessment  DOCUMENTATION CODES:   Not applicable  INTERVENTION:   Ensure Enlive po BID, each supplement provides 350 kcal and 20 grams of protein  Pt receiving Hormel Shake daily with Breakfast which provides 520 kcals and 22 g of protein and Magic cup BID with lunch and dinner, each supplement provides 290 kcal and 9 grams of protein, automatically on meal trays to optimize nutritional intake.   Encourage PO intake  If within goals of care recommend short term TF via Cortrak tube   NUTRITION DIAGNOSIS:   Increased nutrient needs related to (COVID-19) as evidenced by estimated needs.  GOAL:   Patient will meet greater than or equal to 90% of their needs  MONITOR:   PO intake, Supplement acceptance  REASON FOR ASSESSMENT:   Consult Assessment of nutrition requirement/status  ASSESSMENT:   Pt with PMH of dementia, CAD, CVA, HTN, DM admitted from SNF for fever and vomiting with COVID-19 PNA and UTI.   Per RN pt refused dinner and was spitting out food.   Medications reviewed and include: remeron, miralax, vitamin C and zinc  Labs reviewed    NUTRITION - FOCUSED PHYSICAL EXAM:  Deferred; RD working remotely   Diet Order:   Diet Order            DIET - DYS 1 Room service appropriate? Yes; Fluid consistency: Thin  Diet effective now              EDUCATION NEEDS:   Not appropriate for education at this time  Skin:  Skin Assessment: (MASD/skin tear: buttocks)  Last BM:  unknown  Height:   Ht Readings from Last 1 Encounters:  05/26/19 5\' 2"  (1.575 m)    Weight:   Wt Readings from Last 1 Encounters:  05/26/19 59 kg    Ideal Body Weight:  50 kg  BMI:  Body mass index is 23.8 kg/m.  Estimated Nutritional Needs:   Kcal:  1600-1800  Protein:  80-95 grams  Fluid:  > 1.6 L/day  Maylon Peppers RD, LDN, CNSC [phone] Pager [phone] After Hours Pager

## 2019-05-30 NOTE — Evaluation (Signed)
Physical Therapy Evaluation and Discharge Patient Details Name: [name] [name] MRN: [id number] DOB: 10/04/1940 Today's Date: 05/30/2019   History of Present Illness  78 yo female admitted 05/26/19 from SNF [facility]) with weakness, fever, and vomiting (coffee-ground) after testing positive for COVID-19 on 10/28. PMH: GERD, dementia, CAD, CVA, HTN, DM, esophageal ulcer, thyroid disease, macular degeneration, HTN, HLD   Clinical Impression   Patient evaluated by Physical Therapy with no further PT needs identified. Patient is a resident of a long-term care facility and has been bedbound with staff using lift to get her OOB (if at all) due to her agitation and dementia. Currently she prefers the fetal position and swings at nursing staff (during this session). Able to provide PROM to bil LEs and position upright in chair-like position with blanket rolls to maintain trunk in neutral. PT is signing off. Thank you for this referral.     Follow Up Recommendations No PT follow up;Supervision/Assistance - 24 hour    Equipment Recommendations  Other (comment)(air mattress) for hospitalization and long-term   Recommendations for Other Services       Precautions / Restrictions Precautions Precautions: Fall;Other (comment) Precaution Comments: pt easily agitated and will swing/hit with RUE      Mobility  Bed Mobility Overal bed mobility: Needs Assistance Bed Mobility: Rolling Rolling: Total assist         General bed mobility comments: scoot to [place] +2 total assist  Transfers                 General transfer comment: NA; dependent and too agitated  Ambulation/Gait                Stairs            Wheelchair Mobility    Modified Rankin (Stroke Patients Only)       Balance                                             Pertinent Vitals/Pain Pain Assessment: Faces Faces Pain Scale: No hurt    Home Living Family/patient expects to  be discharged to:: Other (Comment)(residential long-term care )                      Prior Function Level of Independence: Needs assistance   Gait / Transfers Assistance Needed: bedbound or lift to chair (currently unsafe due to dementia, easily agitated)     Comments: noted 08/2018 previous admission, PT contacted daughter re: mobility status and pt is lift to chair and unable to participate in skilled PT due to dementia and agitation; pt is a long term care resident     Hand Dominance        Extremity/Trunk Assessment   Upper Extremity Assessment Upper Extremity Assessment: Difficult to assess due to impaired cognition(swings at RN with RUE)    Lower Extremity Assessment Lower Extremity Assessment: RLE deficits/detail;LLE deficits/detail RLE Deficits / Details: able to extend knee and hip to -30 degrees; ankle in PF; strong adduction with only able to abduct knees 3-4" apart LLE Deficits / Details: able to extend knee and hip to -30 degrees; ankle in PF; strong adduction with only able to abduct knees 3-4" apart    Cervical / Trunk Assessment Cervical / Trunk Assessment: Kyphotic  Communication   Communication: No difficulties  Cognition Arousal/Alertness: Awake/alert Behavior During  Therapy: Agitated Overall Cognitive Status: No family/caregiver present to determine baseline cognitive functioning                                 General Comments: h/o severe dementia      General Comments General comments (skin integrity, edema, etc.): Assisted nursing with rolling patient and ROM of extremities while nursing cleaned pt and changed all bed linen due to incontinence.  RN noted blister-like area of skin breakdown on left buttock.     Exercises     Assessment/Plan    PT Assessment Patent does not need any further PT services  PT Problem List         PT Treatment Interventions      PT Goals (Current goals can be found in the Care Plan section)   Acute Rehab PT Goals PT Goal Formulation: All assessment and education complete, DC therapy    Frequency     Barriers to discharge        Co-evaluation               AM-PAC PT "6 Clicks" Mobility  Outcome Measure Help needed turning from your back to your side while in a flat bed without using bedrails?: Total Help needed moving from lying on your back to sitting on the side of a flat bed without using bedrails?: Total Help needed moving to and from a bed to a chair (including a wheelchair)?: Total Help needed standing up from a chair using your arms (e.g., wheelchair or bedside chair)?: Total Help needed to walk in hospital room?: Total Help needed climbing 3-5 steps with a railing? : Total 6 Click Score: 6    End of Session   Activity Tolerance: Treatment limited secondary to agitation Patient left: in bed;with call bell/phone within reach;Other (comment)(chair position; bed alarm not working) Nurse Communication: Mobility status;Need for lift equipment;Other (comment)(likely too agitated for lift; too restless to be safe in cha) PT Visit Diagnosis: Muscle weakness (generalized) (M62.81)    Time: 5400-8676 PT Time Calculation (min) (ACUTE ONLY): 27 min   Charges:   PT Evaluation $PT Eval Low Complexity: 1 Low PT Treatments $Therapeutic Exercise: 8-22 mins         Veda Canning, PT Pager [phone]   Zena Amos 05/30/2019, 1:30 PM

## 2019-05-31 DIAGNOSIS — J1289 Other viral pneumonia: Secondary | ICD-10-CM

## 2019-05-31 DIAGNOSIS — Z8673 Personal history of transient ischemic attack (TIA), and cerebral infarction without residual deficits: Secondary | ICD-10-CM

## 2019-05-31 DIAGNOSIS — U071 COVID-19: Secondary | ICD-10-CM

## 2019-05-31 DIAGNOSIS — E119 Type 2 diabetes mellitus without complications: Secondary | ICD-10-CM

## 2019-05-31 DIAGNOSIS — F0280 Dementia in other diseases classified elsewhere without behavioral disturbance: Secondary | ICD-10-CM

## 2019-05-31 DIAGNOSIS — G309 Alzheimer's disease, unspecified: Secondary | ICD-10-CM

## 2019-05-31 DIAGNOSIS — N179 Acute kidney failure, unspecified: Secondary | ICD-10-CM

## 2019-05-31 LAB — COMPREHENSIVE METABOLIC PANEL
ALT: 12 U/L (ref 0–44)
AST: 21 U/L (ref 15–41)
Albumin: 2.5 g/dL — ABNORMAL LOW (ref 3.5–5.0)
Alkaline Phosphatase: 63 U/L (ref 38–126)
Anion gap: 13 (ref 5–15)
BUN: 10 mg/dL (ref 8–23)
CO2: 21 mmol/L — ABNORMAL LOW (ref 22–32)
Calcium: 8.5 mg/dL — ABNORMAL LOW (ref 8.9–10.3)
Chloride: 107 mmol/L (ref 98–111)
Creatinine, Ser: 0.93 mg/dL (ref 0.44–1.00)
GFR calc Af Amer: >60 mL/min (ref >60)
GFR calc non Af Amer: 59 mL/min — ABNORMAL LOW (ref >60)
Glucose, Bld: 69 mg/dL — ABNORMAL LOW (ref 70–99)
Potassium: 3.6 mmol/L (ref 3.5–5.1)
Sodium: 141 mmol/L (ref 135–145)
Total Bilirubin: 0.6 mg/dL (ref 0.3–1.2)
Total Protein: 6.3 g/dL — ABNORMAL LOW (ref 6.5–8.1)

## 2019-05-31 LAB — GLUCOSE, CAPILLARY
Glucose-Capillary: 122 mg/dL — ABNORMAL HIGH (ref 70–99)
Glucose-Capillary: 174 mg/dL — ABNORMAL HIGH (ref 70–99)
Glucose-Capillary: 180 mg/dL — ABNORMAL HIGH (ref 70–99)
Glucose-Capillary: 197 mg/dL — ABNORMAL HIGH (ref 70–99)
Glucose-Capillary: 221 mg/dL — ABNORMAL HIGH (ref 70–99)
Glucose-Capillary: 225 mg/dL — ABNORMAL HIGH (ref 70–99)
Glucose-Capillary: 96 mg/dL (ref 70–99)

## 2019-05-31 LAB — CBC WITH DIFFERENTIAL/PLATELET
Abs Immature Granulocytes: 0.09 10*3/uL — ABNORMAL HIGH (ref 0.00–0.07)
Basophils Absolute: 0.1 10*3/uL (ref 0.0–0.1)
Basophils Relative: 1 %
Eosinophils Absolute: 0.1 10*3/uL (ref 0.0–0.5)
Eosinophils Relative: 2 %
HCT: 41.8 % (ref 36.0–46.0)
Hemoglobin: 13.4 g/dL (ref 12.0–15.0)
Immature Granulocytes: 1 %
Lymphocytes Relative: 23 %
Lymphs Abs: 1.6 10*3/uL (ref 0.7–4.0)
MCH: 27.7 pg (ref 26.0–34.0)
MCHC: 32.1 g/dL (ref 30.0–36.0)
MCV: 86.4 fL (ref 80.0–100.0)
Monocytes Absolute: 0.4 10*3/uL (ref 0.1–1.0)
Monocytes Relative: 6 %
Neutro Abs: 4.7 10*3/uL (ref 1.7–7.7)
Neutrophils Relative %: 67 %
Platelets: 279 10*3/uL (ref 150–400)
RBC: 4.84 MIL/uL (ref 3.87–5.11)
RDW: 13.5 % (ref 11.5–15.5)
WBC: 7 10*3/uL (ref 4.0–10.5)
nRBC: 0 % (ref 0.0–0.2)

## 2019-05-31 LAB — D-DIMER, QUANTITATIVE: D-Dimer, Quant: 2.59 ug/mL-FEU — ABNORMAL HIGH (ref 0.00–0.50)

## 2019-05-31 LAB — CULTURE, BLOOD (ROUTINE X 2)
Culture: NO GROWTH
Culture: NO GROWTH
Special Requests: ADEQUATE
Special Requests: ADEQUATE

## 2019-05-31 LAB — C-REACTIVE PROTEIN: CRP: 13.5 mg/dL — ABNORMAL HIGH (ref <1.0)

## 2019-05-31 LAB — FERRITIN: Ferritin: 1158 ng/mL — ABNORMAL HIGH (ref 11–307)

## 2019-05-31 NOTE — Progress Notes (Signed)
PROGRESS NOTE  [name] [name]  ZOX:[id number] DOB: 01/02/1941 DOA: 05/26/2019 PCP: Karie SchwalbeLetvak, [name] I, MD   Brief Narrative: [name] A Dreweryis a 78 y.o.femalewith medical history significant ofdementia, CAD, CVA, HTN, DMpresents to ed with weakness and fever and vomiting. Pos covid 10/28 at snf pt severely demented and bedbound at baseline due to also h/o cva. No history can be obtained from pt due to dementia. Pt found to have uti and covid pna with nml vitals and 02 sats.   She was found to have klebsiella UTI.  She's improved on abx.  She also had evidence of COVID 19 pneumonia, but has never had an O2 requirement, so steroids (received 1 dose dex) and remdesivir have not been given.  hospitalization c/b delirium which seems to be slowly improving, but still with poor PO intake.  Discharge pending further improvement in delirium and oral intake.   Assessment & Plan: Principal Problem:   UTI (urinary tract infection) Active Problems:   H/O: CVA (cerebrovascular accident)   Diabetes mellitus, type 2 (HCC)   CKD (chronic kidney disease)   Alzheimer's disease (HCC)   ARF (acute renal failure) (HCC)   Pneumonia due to COVID-19 virus  Klebsiella UTI:  - Complete course of abx per urine culture data, resistant only to ampicillin. No abscess/complication noted on CT abd/pelvis.   AKI: Resolved, Cr back to baseline 0.9-1.0.  - Monitor, avoid nephrotoxins  Covid-19 pneumonia: With bibasilar infiltrates without hypoxia. + on 11/6.  - Continue to monitor respiratory status and avoid steroids unless grows hypoxic. If any symptoms occur, would have low threshold to start remdesivir given elevated inflammatory markers.   Acute metabolic encephalopathy on chronic dementia: Suspect the only way to improve this might be discharge out of acute care facility.  - Delirium precautions.  - Continue namenda, remeron. Holding neurontin w/AKI and AMS.   T2DM: Uncontrolled with hypoglycemia  due to poor per oral intake.  - Stopped basal insulin. - Continue SSI   Concern for GI bleed: Hematemesis reported in ED.  Guiac negative per EDP.  Hb stable without anemia or BUN elevation, arguing against GIB.  - Continue po PPI and monitoring. No further episodes reported.  Hypomagnesemia:  - Replace and follow  Subclinical hypothyroidism: TSH 5.120. Free T4 wnl at 0.97.  - Outpatient follow up.   Hyperlipidemia:  - Continue statin  CAD: No chest pain - Continue home medications  History of CVA:  - Continue home medications.    GERD:  - Continue PPI   DVT prophylaxis: Heparin 5k u q8h Code Status: Full reported at admission Family Communication: None at bedside. Will call daughter for update.  Disposition Plan: Uncertain  Consultants:   None  Procedures:   None  Antimicrobials:  Ceftriaxone > Ancef   Subjective: Confused, denies any pain anywhere or trouble breathing. Eating very little.   Objective: Vitals:   05/31/19 0353 05/31/19 0400 05/31/19 0736 05/31/19 1617  BP:  108/86 101/87 (!) 115/54  Pulse:  92 96 79  Resp:    16  Temp: 97.9 F (36.6 C)  97.7 F (36.5 C) 98.7 F (37.1 C)  TempSrc: Axillary  Axillary Axillary  SpO2:  99% 99% 99%  Weight:      Height:        Intake/Output Summary (Last 24 hours) at 05/31/2019 1658 Last data filed at 05/31/2019 1010 Gross per 24 hour  Intake 121 ml  Output 600 ml  Net -479 ml   [employer] PowerFiled Weights  05/26/19 2302  Weight: 59 kg    Gen: Frail elderly female in no distress Pulm: Non-labored breathing room air. Clear to auscultation bilaterally.  CV: Regular rate and rhythm. No murmur, rub, or gallop. No JVD, no pedal edema. GI: Abdomen soft, non-tender, non-distended, with normoactive bowel sounds. No organomegaly or masses felt. Ext: Warm, no deformities Skin: No rashes, lesions or ulcers. Heel boots bilaterally.  Neuro: Alert and not oriented. No focal neurological deficits on limited exam.  Psych: Judgement and insight appear impaired, not cooperative but not combative either.   Data Reviewed: I have personally reviewed following labs and imaging studies  CBC: Recent Labs  Lab 05/27/19 0037 05/27/19 1541 05/28/19 0340 05/29/19 0830 05/30/19 0633 05/31/19 0115  WBC 7.6  --  10.9* 6.5 7.2 7.0  NEUTROABS 5.9  --  8.4* 4.1 4.8 4.7  HGB 13.8 14.2 12.9 12.2 12.4 13.4  HCT 44.1 43.4 40.0 38.1 38.5 41.8  MCV 89.8  --  86.4 88.6 86.5 86.4  PLT 300  --  321 253 261 528   Basic Metabolic Panel: Recent Labs  Lab 05/27/19 0037 05/28/19 0340 05/29/19 0830 05/30/19 0633 05/31/19 0115  NA 151* 148* 144 137 141  K 4.5 3.9 3.9 3.5 3.6  CL 110 112* 113* 106 107  CO2 22 23 20* 21* 21*  GLUCOSE 381* 108* 154* 134* 69*  BUN 53* 57* 28* 14 10  CREATININE 1.64* 1.82* 1.19* 0.95 0.93  CALCIUM 9.1 8.7* 8.0* 7.7* 8.5*  MG  --   --  1.6*  --   --    GFR: Estimated Creatinine Clearance: 39.4 mL/min (by C-G formula based on SCr of 0.93 mg/dL). Liver Function Tests: Recent Labs  Lab 05/27/19 0037 05/28/19 0340 05/29/19 0830 05/30/19 0633 05/31/19 0115  AST 29 23 21 20 21   ALT 19 19 14 13 12   ALKPHOS 76 69 66 63 63  BILITOT 0.9 0.8 0.4 0.8 0.6  PROT 7.1 6.8 5.7* 6.0* 6.3*  ALBUMIN 2.9* 2.9* 2.4* 2.4* 2.5*   No results for input(s): LIPASE, AMYLASE in the last 168 hours. No results for input(s): AMMONIA in the last 168 hours. Coagulation Profile: No results for input(s): INR, PROTIME in the last 168 hours. Cardiac Enzymes: No results for input(s): CKTOTAL, CKMB, CKMBINDEX, TROPONINI in the last 168 hours. BNP (last 3 results) No results for input(s): PROBNP in the last 8760 hours. HbA1C: No results for input(s): HGBA1C in the last 72 hours. CBG: Recent Labs  Lab 05/30/19 2356 05/31/19 0401 05/31/19 0735 05/31/19 1111 05/31/19 1619  GLUCAP 96 122* 180* 221* 197*   Lipid Profile: No results for input(s): CHOL, HDL, LDLCALC, TRIG, CHOLHDL, LDLDIRECT in the last  72 hours. Thyroid Function Tests: Recent Labs    05/29/19 0830  FREET4 0.97   Anemia Panel: Recent Labs    05/30/19 0633 05/31/19 0115  FERRITIN 660* 1,158*   Urine analysis:    Component Value Date/Time   COLORURINE AMBER (A) 05/26/2019 1121   APPEARANCEUR CLOUDY (A) 05/26/2019 1121   APPEARANCEUR Clear 11/16/2013 2342   LABSPEC 1.019 05/26/2019 1121   LABSPEC 1.019 11/16/2013 2342   PHURINE 5.0 05/26/2019 1121   GLUCOSEU 150 (A) 05/26/2019 1121   GLUCOSEU 150 mg/dL 11/16/2013 2342   HGBUR SMALL (A) 05/26/2019 1121   BILIRUBINUR NEGATIVE 05/26/2019 1121   BILIRUBINUR Negative 11/16/2013 2342   KETONESUR 5 (A) 05/26/2019 1121   PROTEINUR 30 (A) 05/26/2019 1121   UROBILINOGEN 0.2 09/22/2011 1412   NITRITE NEGATIVE  05/26/2019 1121   LEUKOCYTESUR LARGE (A) 05/26/2019 1121   LEUKOCYTESUR Negative 11/16/2013 2342   Recent Results (from the past 240 hour(s))  Urine culture     Status: Abnormal   Collection Time: 05/26/19 11:21 AM   Specimen: Urine, Catheterized  Result Value Ref Range Status   Specimen Description   Final    URINE, CATHETERIZED Performed at [facility], [address] [place], [place] [address]    Special Requests   Final    NONE Performed at [facility], [address] [place], [place] [address]    Culture >=100,000 COLONIES/mL KLEBSIELLA PNEUMONIAE (A)  Final   Report Status 05/28/2019 FINAL  Final   Organism ID, Bacteria KLEBSIELLA PNEUMONIAE (A)  Final      Susceptibility   Klebsiella pneumoniae - MIC*    AMPICILLIN >=32 RESISTANT Resistant     CEFAZOLIN <=4 SENSITIVE Sensitive     CEFTRIAXONE <=1 SENSITIVE Sensitive     CIPROFLOXACIN <=0.25 SENSITIVE Sensitive     GENTAMICIN <=1 SENSITIVE Sensitive     IMIPENEM <=0.25 SENSITIVE Sensitive     NITROFURANTOIN 32 SENSITIVE Sensitive     TRIMETH/SULFA <=20 SENSITIVE Sensitive     AMPICILLIN/SULBACTAM 4 SENSITIVE Sensitive     PIP/TAZO <=4 SENSITIVE Sensitive      Extended ESBL NEGATIVE Sensitive     * >=100,000 COLONIES/mL KLEBSIELLA PNEUMONIAE  Blood culture (routine x 2)     Status: None   Collection Time: 05/26/19 11:21 AM   Specimen: BLOOD  Result Value Ref Range Status   Specimen Description BLOOD L FA  Final   Special Requests   Final    BOTTLES DRAWN AEROBIC AND ANAEROBIC Blood Culture adequate volume   Culture   Final    NO GROWTH 5 DAYS Performed at [facility], [address] [place], [place] [address]    Report Status 05/31/2019 FINAL  Final  SARS CORONAVIRUS 2 (TAT 6-24 HRS) Nasopharyngeal     Status: Abnormal   Collection Time: 05/26/19 11:31 AM   Specimen: Nasopharyngeal  Result Value Ref Range Status   SARS Coronavirus 2 POSITIVE (A) NEGATIVE Final    Comment: RESULT CALLED TO, READ BACK BY AND VERIFIED WITH: [name] 1820 05/26/2019 [name] (NOTE) SARS-CoV-2 target nucleic acids are DETECTED. The SARS-CoV-2 RNA is generally detectable in upper and lower respiratory specimens during the acute phase of infection. Positive results are indicative of active infection with SARS-CoV-2. Clinical  correlation with patient history and other diagnostic information is necessary to determine patient infection status. Positive results do  not rule out bacterial infection or co-infection with other viruses. The expected result is Negative. Fact Sheet for Patients: HairSlick.no Fact Sheet for Healthcare Providers: quierodirigir.com This test is not yet approved or cleared by the [place] FDA and  has been authorized for detection and/or diagnosis of SARS-CoV-2 by FDA under an Emergency Use Authorization (EUA). This EUA will remain  in effect (meaning this test can be used)  for the duration of the COVID-19 declaration under Section 564(b)(1) of the Act, 21 U.S.C. section 360bbb-3(b)(1), unless the authorization is terminated or revoked sooner. Performed at  [facility], [address] [place], [place] [address]   Blood culture (routine x 2)     Status: None   Collection Time: 05/26/19  4:24 PM   Specimen: BLOOD  Result Value Ref Range Status   Specimen Description BLOOD BLOOD LEFT FOREARM  Final   Special Requests  Final    BOTTLES DRAWN AEROBIC AND ANAEROBIC Blood Culture adequate volume   Culture   Final    NO GROWTH 5 DAYS Performed at [facility], [address] [place], [place] [address]    Report Status 05/31/2019 FINAL  Final     Scheduled Meds: . feeding supplement (ENSURE ENLIVE)  237 mL Oral BID BM  . heparin injection (subcutaneous)  5,000 Units Subcutaneous Q8H  . insulin aspart  0-9 Units Subcutaneous Q4H  . memantine  10 mg Oral BID  . mirtazapine  15 mg Oral QHS  . pantoprazole  40 mg Oral BID  . polyethylene glycol  17 g Oral Daily  . pravastatin  20 mg Oral q1800  . sodium chloride flush  3 mL Intravenous Q12H  . vitamin C  500 mg Oral Daily  . zinc sulfate  220 mg Oral Daily   Continuous Infusions: . sodium chloride    .  ceFAZolin (ANCEF) IV 1 g (05/31/19 1347)     LOS: 5 days   Time spent: 25 minutes.  Tyrone Nine, MD [facility] [email or web address] 05/31/2019, 4:58 PM

## 2019-05-31 NOTE — Progress Notes (Signed)
Patient asking for food, offered her, apple sauce, jello and a frozen meal. Patient took a few bites and refused to eat it.

## 2019-05-31 NOTE — Plan of Care (Signed)
Updated pt on POC, pt refusing any education or assistance, pt gets agitated when talking to her.

## 2019-05-31 NOTE — Progress Notes (Signed)
Occupational Therapy Evaluation Patient Details Name: [name] [name] MRN: [id number] DOB: November 08, 1940 Today's Date: 05/31/2019    History of Present Illness 78 yo female admitted 05/26/19 from SNF [facility]) with weakness, fever, and vomiting (coffee-ground) after testing positive for COVID-19 on 10/28. PMH: GERD, dementia, CAD, CVA, HTN, DM, esophageal ulcer, thyroid disease, macular degeneration, HTN, HLD    Clinical Impression   Pt long term SNF resident. Requires total A with all ADL tasks, including feeding. Poor PO intake. Offered bites of jello/applesauce. Pt took @ 4 bites then said "No more". Given poor PO intake, RECOMMEND offering pt food/drink with each visit to attempt to increase PO intake.  Pt with R hand contracture, however do not feel pt would tolerate positionoing device. REcommend ROM as allowed during ADL.     Follow Up Recommendations  SNF;Supervision/Assistance - 24 hour    Equipment Recommendations  None recommended by OT    Recommendations for Other Services       Precautions / Restrictions Precautions Precautions: Fall;Other (comment) Precaution Comments: pt easily agitated and will swing/hit with RUE Restrictions Weight Bearing Restrictions: No      Mobility Bed Mobility Overal bed mobility: Needs Assistance Bed Mobility: Rolling Rolling: Total assist            Transfers                 General transfer comment: NA; dependent and too agitated    Balance                                           ADL either performed or assessed with clinical judgement   ADL Overall ADL's : Needs assistance/impaired                                       General ADL Comments: total A with all ADL including feeding. Attempted to self feed - pt unable. Poor PO intake     Vision         Perception     Praxis      Pertinent Vitals/Pain Pain Assessment: Faces Faces Pain Scale: No hurt     Hand  Dominance     Extremity/Trunk Assessment Upper Extremity Assessment Upper Extremity Assessment: RUE deficits/detail;Generalized weakness RUE Deficits / Details: R hand contracture; positions in flexion   Lower Extremity Assessment Lower Extremity Assessment: Defer to PT evaluation RLE Deficits / Details: able to extend knee and hip to -30 degrees; ankle in PF; strong adduction with only able to abduct knees 3-4" apart LLE Deficits / Details: able to extend knee and hip to -30 degrees; ankle in PF; strong adduction with only able to abduct knees 3-4" apart   Cervical / Trunk Assessment Cervical / Trunk Assessment: Kyphotic   Communication Communication Communication: No difficulties   Cognition Arousal/Alertness: Awake/alert Behavior During Therapy: Agitated Overall Cognitive Status: No family/caregiver present to determine baseline cognitive functioning                                 General Comments: h/o severe dementia; most likely close to baseline   General Comments       Exercises     Shoulder Instructions      Home  Living Family/patient expects to be discharged to:: Other (Comment)(residential long-term care )                                        Prior Functioning/Environment Level of Independence: Needs assistance  Gait / Transfers Assistance Needed: bedbound or lift to chair (currently unsafe due to dementia, easily agitated)     Comments: noted 08/2018 previous admission, PT contacted daughter re: mobility status and pt is lift to chair and unable to participate in skilled PT due to dementia and agitation; pt is a long term care resident        OT Problem List: Decreased strength;Decreased range of motion;Decreased activity tolerance;Impaired balance (sitting and/or standing);Impaired vision/perception;Decreased cognition;Decreased safety awareness      OT Treatment/Interventions:      OT Goals(Current goals can be found in  the care plan section) Acute Rehab OT Goals Patient Stated Goal: unable to state OT Goal Formulation: Patient unable to participate in goal setting  OT Frequency:     Barriers to D/C:            Co-evaluation              AM-PAC OT "6 Clicks" Daily Activity     Outcome Measure Help from another person eating meals?: Total Help from another person taking care of personal grooming?: Total Help from another person toileting, which includes using toliet, bedpan, or urinal?: Total Help from another person bathing (including washing, rinsing, drying)?: Total Help from another person to put on and taking off regular upper body clothing?: Total Help from another person to put on and taking off regular lower body clothing?: Total 6 Click Score: 6   End of Session Nurse Communication: Mobility status  Activity Tolerance: Treatment limited secondary to agitation Patient left: in bed;with bed alarm set;with call bell/phone within reach  OT Visit Diagnosis: Muscle weakness (generalized) (M62.81);Other symptoms and signs involving cognitive function                Time: 1583-0940 OT Time Calculation (min): 16 min Charges:  OT General Charges $OT Visit: 1 Visit OT Evaluation $OT Eval Low Complexity: [place], OT/L   Acute OT Clinical Specialist Acute Rehabilitation Services Pager [phone] Office [phone]   [facility] 05/31/2019, 10:45 AM

## 2019-06-01 ENCOUNTER — Encounter (HOSPITAL_COMMUNITY): Payer: Self-pay | Admitting: Family Medicine

## 2019-06-01 DIAGNOSIS — G9341 Metabolic encephalopathy: Secondary | ICD-10-CM | POA: Diagnosis present

## 2019-06-01 LAB — CBC WITH DIFFERENTIAL/PLATELET
Abs Immature Granulocytes: 0.11 10*3/uL — ABNORMAL HIGH (ref 0.00–0.07)
Basophils Absolute: 0 10*3/uL (ref 0.0–0.1)
Basophils Relative: 1 %
Eosinophils Absolute: 0.2 10*3/uL (ref 0.0–0.5)
Eosinophils Relative: 2 %
HCT: 40.2 % (ref 36.0–46.0)
Hemoglobin: 13 g/dL (ref 12.0–15.0)
Immature Granulocytes: 1 %
Lymphocytes Relative: 26 %
Lymphs Abs: 2.1 10*3/uL (ref 0.7–4.0)
MCH: 27.4 pg (ref 26.0–34.0)
MCHC: 32.3 g/dL (ref 30.0–36.0)
MCV: 84.8 fL (ref 80.0–100.0)
Monocytes Absolute: 0.6 10*3/uL (ref 0.1–1.0)
Monocytes Relative: 7 %
Neutro Abs: 5 10*3/uL (ref 1.7–7.7)
Neutrophils Relative %: 63 %
Platelets: 279 10*3/uL (ref 150–400)
RBC: 4.74 MIL/uL (ref 3.87–5.11)
RDW: 13.4 % (ref 11.5–15.5)
WBC: 8 10*3/uL (ref 4.0–10.5)
nRBC: 0 % (ref 0.0–0.2)

## 2019-06-01 LAB — COMPREHENSIVE METABOLIC PANEL
ALT: 8 U/L (ref 0–44)
AST: 21 U/L (ref 15–41)
Albumin: 2.7 g/dL — ABNORMAL LOW (ref 3.5–5.0)
Alkaline Phosphatase: 62 U/L (ref 38–126)
Anion gap: 12 (ref 5–15)
BUN: 13 mg/dL (ref 8–23)
CO2: 22 mmol/L (ref 22–32)
Calcium: 8.6 mg/dL — ABNORMAL LOW (ref 8.9–10.3)
Chloride: 109 mmol/L (ref 98–111)
Creatinine, Ser: 1.27 mg/dL — ABNORMAL HIGH (ref 0.44–1.00)
GFR calc Af Amer: 47 mL/min — ABNORMAL LOW (ref >60)
GFR calc non Af Amer: 40 mL/min — ABNORMAL LOW (ref >60)
Glucose, Bld: 127 mg/dL — ABNORMAL HIGH (ref 70–99)
Potassium: 3.4 mmol/L — ABNORMAL LOW (ref 3.5–5.1)
Sodium: 143 mmol/L (ref 135–145)
Total Bilirubin: 0.7 mg/dL (ref 0.3–1.2)
Total Protein: 6.1 g/dL — ABNORMAL LOW (ref 6.5–8.1)

## 2019-06-01 LAB — GLUCOSE, CAPILLARY
Glucose-Capillary: 121 mg/dL — ABNORMAL HIGH (ref 70–99)
Glucose-Capillary: 121 mg/dL — ABNORMAL HIGH (ref 70–99)
Glucose-Capillary: 163 mg/dL — ABNORMAL HIGH (ref 70–99)
Glucose-Capillary: 78 mg/dL (ref 70–99)
Glucose-Capillary: 91 mg/dL (ref 70–99)

## 2019-06-01 LAB — C-REACTIVE PROTEIN: CRP: 10.3 mg/dL — ABNORMAL HIGH (ref <1.0)

## 2019-06-01 LAB — D-DIMER, QUANTITATIVE: D-Dimer, Quant: 1.8 ug/mL-FEU — ABNORMAL HIGH (ref 0.00–0.50)

## 2019-06-01 MED ORDER — KCL IN DEXTROSE-NACL 10-5-0.45 MEQ/L-%-% IV SOLN
INTRAVENOUS | Status: DC
  Administered 2019-06-01 – 2019-06-02 (×2): via INTRAVENOUS
  Filled 2019-06-01 (×3): qty 1000

## 2019-06-01 NOTE — Progress Notes (Signed)
Refusing food/oral intake and PO meds during the whole day despite encouragement. She took a few sips of tea today and that is all.

## 2019-06-01 NOTE — Progress Notes (Addendum)
PROGRESS NOTE  [name] [name]  LNL:[id number] DOB: 29-Jul-1940 DOA: 05/26/2019 PCP: Venia Carbon, MD   Brief Narrative: [name] A Dreweryis a 78 y.o.femalewith medical history significant ofdementia, CAD, CVA, HTN, DMpresents to ed with weakness and fever and vomiting. Pos covid 10/28 at snf pt severely demented and bedbound at baseline due to also h/o cva. No history can be obtained from pt due to dementia. Pt found to have uti and covid pna with nml vitals and 02 sats.   She was found to have klebsiella UTI.  She's improved on abx.  She also had evidence of COVID 19 pneumonia, but has never had an O2 requirement, so steroids (received 1 dose dex) and remdesivir have not been given.  hospitalization c/b delirium which seems to be slowly improving, but still with poor PO intake.  Discharge pending further improvement in delirium and oral intake.   Assessment & Plan: Principal Problem:   UTI (urinary tract infection) Active Problems:   H/O: CVA (cerebrovascular accident)   Diabetes mellitus, type 2 ([place])   CKD (chronic kidney disease)   Alzheimer's disease ([place])   ARF (acute renal failure) ([place])   Pneumonia due to COVID-19 virus  Klebsiella UTI:  - Complete course of abx per urine culture data, resistant only to ampicillin. No abscess/complication noted on CT abd/pelvis. No leukocytosis.  AKI on stage IIIa CKD (based on available creatinine data): Resolved, Cr back to baseline 0.9-1.0, then rising again 11/12 in setting of poor po intake.  - Will need to restart IVF and monitor renal function.  - Monitor, avoid nephrotoxins  Covid-19 pneumonia: With bibasilar infiltrates without hypoxia. + on 11/6.  - Still 99-100% on RA. Continue to monitor respiratory status and avoid steroids unless grows hypoxic. If any symptoms occur, would have low threshold to start remdesivir given elevated inflammatory markers.   Acute metabolic encephalopathy on chronic dementia: Suspect the  only way to improve this might be discharge out of acute care facility.  - Delirium precautions.  - Continue namenda, remeron. Holding neurontin w/AKI and AMS.   T2DM: Uncontrolled with hypoglycemia due to poor per oral intake.  - Stopped basal insulin. - Continue SSI with no hypoglycemia in 24 hrs.  Concern for GI bleed: Hematemesis reported in ED.  Guiac negative per EDP.  Hb stable without anemia or BUN elevation, arguing against GIB.  - Continue po PPI and monitoring. No further episodes reported.  Hypomagnesemia:  - Replaced, will recheck in AM  Hypokalemia:  - Replace in IVF as ordered.  Subclinical hypothyroidism: TSH 5.120. Free T4 wnl at 0.97.  - Outpatient follow up.   Hyperlipidemia:  - Continue statin  CAD: No chest pain - Continue home medications  History of CVA:  - Continue home medications.    GERD:  - Continue PPI   DVT prophylaxis: Heparin 5k u q8h Code Status: Full, reported at admission Family Communication: None at bedside. Contacting daughter daily by phone, will call after rounds Disposition Plan: Uncertain, if po intake improves, can attempt DC 11/13.   Consultants:   None  Procedures:   None  Antimicrobials:  Ceftriaxone > Ancef   Subjective: Remains confused, refusing some medications and po intake. No agitation, just not cooperative.   Objective: Vitals:   05/31/19 1617 05/31/19 1946 05/31/19 2219 06/01/19 0515  BP: (!) 115/54 108/82 (!) 134/55 122/73  Pulse: 79  75 80  Resp: 16 16 16    Temp: 98.7 F (37.1 C) 98.2 F (36.8 C) (!) 97.5  F (36.4 C) 97.6 F (36.4 C)  TempSrc: Axillary Oral Axillary Axillary  SpO2: 99%  98% 99%  Weight:      Height:        Intake/Output Summary (Last 24 hours) at 06/01/2019 0727 Last data filed at 06/01/2019 6384 Gross per 24 hour  Intake 221 ml  Output 275 ml  Net -54 ml   Filed Weights   05/26/19 2302  Weight: 59 kg   Gen: Frail elderly female in no distress Pulm:  Nonlabored breathing room air. Clear. CV: Regular rate and rhythm. No murmur, rub, or gallop. No JVD, no dependent edema. GI: Abdomen soft, non-tender, non-distended, with normoactive bowel sounds.  Ext: Warm, no deformities Skin: No new rashes, lesions or ulcers on visualized skin. Neuro: Sleeping but rousable, speaks short sentences, not oriented. Not cooperative with neurological exam. Psych: Judgement and insight appear impaired. Mood euthymic & affect congruent.   Data Reviewed: I have personally reviewed following labs and imaging studies  CBC: Recent Labs  Lab 05/28/19 0340 05/29/19 0830 05/30/19 0633 05/31/19 0115 06/01/19 0530  WBC 10.9* 6.5 7.2 7.0 8.0  NEUTROABS 8.4* 4.1 4.8 4.7 5.0  HGB 12.9 12.2 12.4 13.4 13.0  HCT 40.0 38.1 38.5 41.8 40.2  MCV 86.4 88.6 86.5 86.4 84.8  PLT 321 253 261 279 279   Basic Metabolic Panel: Recent Labs  Lab 05/28/19 0340 05/29/19 0830 05/30/19 0633 05/31/19 0115 06/01/19 0530  NA 148* 144 137 141 143  K 3.9 3.9 3.5 3.6 3.4*  CL 112* 113* 106 107 109  CO2 23 20* 21* 21* 22  GLUCOSE 108* 154* 134* 69* 127*  BUN 57* 28* 14 10 13   CREATININE 1.82* 1.19* 0.95 0.93 1.27*  CALCIUM 8.7* 8.0* 7.7* 8.5* 8.6*  MG  --  1.6*  --   --   --    GFR: Estimated Creatinine Clearance: 28.9 mL/min (A) (by C-G formula based on SCr of 1.27 mg/dL (H)). Liver Function Tests: Recent Labs  Lab 05/28/19 0340 05/29/19 0830 05/30/19 0633 05/31/19 0115 06/01/19 0530  AST 23 21 20 21 21   ALT 19 14 13 12 8   ALKPHOS 69 66 63 63 62  BILITOT 0.8 0.4 0.8 0.6 0.7  PROT 6.8 5.7* 6.0* 6.3* 6.1*  ALBUMIN 2.9* 2.4* 2.4* 2.5* 2.7*   No results for input(s): LIPASE, AMYLASE in the last 168 hours. No results for input(s): AMMONIA in the last 168 hours. Coagulation Profile: No results for input(s): INR, PROTIME in the last 168 hours. Cardiac Enzymes: No results for input(s): CKTOTAL, CKMB, CKMBINDEX, TROPONINI in the last 168 hours. BNP (last 3 results)  No results for input(s): PROBNP in the last 8760 hours. HbA1C: No results for input(s): HGBA1C in the last 72 hours. CBG: Recent Labs  Lab 05/31/19 1111 05/31/19 1619 05/31/19 1943 06/01/19 0046 06/01/19 0514  GLUCAP 221* 197* 225* 121* 121*   Lipid Profile: No results for input(s): CHOL, HDL, LDLCALC, TRIG, CHOLHDL, LDLDIRECT in the last 72 hours. Thyroid Function Tests: Recent Labs    05/29/19 0830  FREET4 0.97   Anemia Panel: Recent Labs    05/30/19 0633 05/31/19 0115  FERRITIN 660* 1,158*   Urine analysis:    Component Value Date/Time   COLORURINE AMBER (A) 05/26/2019 1121   APPEARANCEUR CLOUDY (A) 05/26/2019 1121   APPEARANCEUR Clear 11/16/2013 2342   LABSPEC 1.019 05/26/2019 1121   LABSPEC 1.019 11/16/2013 2342   PHURINE 5.0 05/26/2019 1121   GLUCOSEU 150 (A) 05/26/2019 1121  GLUCOSEU 150 mg/dL 91/47/8295 6213   HGBUR SMALL (A) 05/26/2019 1121   BILIRUBINUR NEGATIVE 05/26/2019 1121   BILIRUBINUR Negative 11/16/2013 2342   KETONESUR 5 (A) 05/26/2019 1121   PROTEINUR 30 (A) 05/26/2019 1121   UROBILINOGEN 0.2 09/22/2011 1412   NITRITE NEGATIVE 05/26/2019 1121   LEUKOCYTESUR LARGE (A) 05/26/2019 1121   LEUKOCYTESUR Negative 11/16/2013 2342   Recent Results (from the past 240 hour(s))  Urine culture     Status: Abnormal   Collection Time: 05/26/19 11:21 AM   Specimen: Urine, Catheterized  Result Value Ref Range Status   Specimen Description   Final    URINE, CATHETERIZED Performed at [facility], [address] [place], [place] [address]    Special Requests   Final    NONE Performed at [facility], [address] [place], [place] [address]    Culture >=100,000 COLONIES/mL KLEBSIELLA PNEUMONIAE (A)  Final   Report Status 05/28/2019 FINAL  Final   Organism ID, Bacteria KLEBSIELLA PNEUMONIAE (A)  Final      Susceptibility   Klebsiella pneumoniae - MIC*    AMPICILLIN >=32 RESISTANT Resistant     CEFAZOLIN <=4 SENSITIVE  Sensitive     CEFTRIAXONE <=1 SENSITIVE Sensitive     CIPROFLOXACIN <=0.25 SENSITIVE Sensitive     GENTAMICIN <=1 SENSITIVE Sensitive     IMIPENEM <=0.25 SENSITIVE Sensitive     NITROFURANTOIN 32 SENSITIVE Sensitive     TRIMETH/SULFA <=20 SENSITIVE Sensitive     AMPICILLIN/SULBACTAM 4 SENSITIVE Sensitive     PIP/TAZO <=4 SENSITIVE Sensitive     Extended ESBL NEGATIVE Sensitive     * >=100,000 COLONIES/mL KLEBSIELLA PNEUMONIAE  Blood culture (routine x 2)     Status: None   Collection Time: 05/26/19 11:21 AM   Specimen: BLOOD  Result Value Ref Range Status   Specimen Description BLOOD L FA  Final   Special Requests   Final    BOTTLES DRAWN AEROBIC AND ANAEROBIC Blood Culture adequate volume   Culture   Final    NO GROWTH 5 DAYS Performed at [facility], [address] [place], [place] [address]    Report Status 05/31/2019 FINAL  Final  SARS CORONAVIRUS 2 (TAT 6-24 HRS) Nasopharyngeal     Status: Abnormal   Collection Time: 05/26/19 11:31 AM   Specimen: Nasopharyngeal  Result Value Ref Range Status   SARS Coronavirus 2 POSITIVE (A) NEGATIVE Final    Comment: RESULT CALLED TO, READ BACK BY AND VERIFIED WITH: [name] 1820 05/26/2019 [name] (NOTE) SARS-CoV-2 target nucleic acids are DETECTED. The SARS-CoV-2 RNA is generally detectable in upper and lower respiratory specimens during the acute phase of infection. Positive results are indicative of active infection with SARS-CoV-2. Clinical  correlation with patient history and other diagnostic information is necessary to determine patient infection status. Positive results do  not rule out bacterial infection or co-infection with other viruses. The expected result is Negative. Fact Sheet for Patients: HairSlick.no Fact Sheet for Healthcare Providers: quierodirigir.com This test is not yet approved or cleared by the [place] FDA and  has been  authorized for detection and/or diagnosis of SARS-CoV-2 by FDA under an Emergency Use Authorization (EUA). This EUA will remain  in effect (meaning this test can be used)  for the duration of the COVID-19 declaration under Section 564(b)(1) of the Act, 21 U.S.C. section 360bbb-3(b)(1), unless the authorization is terminated or revoked sooner. Performed at [facility], [address], [place], [place]  [id number]   Blood culture (routine x 2)     Status: None   Collection Time: 05/26/19  4:24 PM   Specimen: BLOOD  Result Value Ref Range Status   Specimen Description BLOOD BLOOD LEFT FOREARM  Final   Special Requests   Final    BOTTLES DRAWN AEROBIC AND ANAEROBIC Blood Culture adequate volume   Culture   Final    NO GROWTH 5 DAYS Performed at [facility], [address] [place], [place] [address]    Report Status 05/31/2019 FINAL  Final     Scheduled Meds: . feeding supplement (ENSURE ENLIVE)  237 mL Oral BID BM  . heparin injection (subcutaneous)  5,000 Units Subcutaneous Q8H  . insulin aspart  0-9 Units Subcutaneous Q4H  . memantine  10 mg Oral BID  . mirtazapine  15 mg Oral QHS  . pantoprazole  40 mg Oral BID  . polyethylene glycol  17 g Oral Daily  . pravastatin  20 mg Oral q1800  . sodium chloride flush  3 mL Intravenous Q12H  . vitamin C  500 mg Oral Daily  . zinc sulfate  220 mg Oral Daily   Continuous Infusions: . sodium chloride    .  ceFAZolin (ANCEF) IV 1 g (06/01/19 0109)  . dextrose 5 % and 0.45 % NaCl with KCl 10 mEq/L       LOS: 6 days   Time spent: 25 minutes.  Tyrone Nineyan B Charlotta Lapaglia, MD [facility] [email or web address] 06/01/2019, 7:27 AM

## 2019-06-01 NOTE — TOC Progression Note (Signed)
Transition of Care [facility]) - Progression Note    Patient Details  Name: [name] [name] MRN: [id number] Date of Birth: [birth year]  [facility] CM/SW Contact  Loletha Grayer Beverely Pace, RN Phone Number: 06/01/2019, 4:10 PM  Clinical Narrative:   Case manager contacted Seth Bake at [facility] to confirm that patient can return. She has been accepted back for Friday 06/02/19. FL2 and DC summary will be faxed in the morning. Case manager will update patient's daughter.         Expected Discharge Plan and Services : return to [facility] Determinants of Health (SDOH) Interventions    Readmission Risk Interventions Readmission Risk Prevention Plan 10/31/2018  Transportation Screening Complete  Medication Review Press photographer) Complete  PCP or Specialist appointment within 3-5 days of discharge Complete  HRI or Home Care Consult Complete  SW Recovery Care/Counseling Consult Patient refused  Palliative Care Screening Complete  Skilled Nursing Facility Complete  Some recent data might be hidden

## 2019-06-01 NOTE — NC FL2 (Signed)
[place] MEDICAID FL2 LEVEL OF CARE SCREENING TOOL     IDENTIFICATION  Patient Name: [name] [name] Birthdate: Jul 19, 1941 Sex: female Admission Date (Current Location): 05/26/2019  [facility] and [place] Number:  Producer, television/film/video and Address:  The . [facility], [address], [place], [place] [address]([address]      Provider Number: [id number]  Attending Physician Name and Address:  Tyrone Nine, MD  Relative Name and Phone Number:  Daughter: [name] [name] [phone]    Current Level of Care: Hospital Recommended Level of Care: Skilled Nursing Facility Prior Approval Number:    Date Approved/Denied:   PASRR Number: [id number]  Discharge Plan: SNF    Current Diagnoses: Patient Active Problem List   Diagnosis Date Noted  . Acute metabolic encephalopathy 06/01/2019  . UTI (urinary tract infection) 05/26/2019  . Pneumonia due to COVID-19 virus 05/26/2019  . Sepsis (HCC) 10/27/2018  . Iron deficiency anemia due to chronic blood loss 09/06/2018  . Pressure injury of skin 09/04/2018  . ARF (acute renal failure) (HCC) 09/03/2018  . GI bleed 07/15/2018  . DKA (diabetic ketoacidoses) (HCC) 03/18/2018  . Upper GI bleed 10/16/2015  . Intractable nausea and vomiting 10/16/2015  . Hypertensive urgency 10/16/2015  . Acute delirium 05/30/2015  . Alzheimer's disease (HCC) 05/30/2015  . Altered mental status 05/24/2015  . Occlusion and stenosis of carotid artery without mention of cerebral infarction 10/08/2011  . CKD (chronic kidney disease) 09/25/2011  . H/O: CVA (cerebrovascular accident) 09/22/2011  . Diabetes mellitus, type 2 (HCC) 09/22/2011    Orientation RESPIRATION BLADDER Height & Weight     (not oriented)  Normal Incontinent Weight: 59 kg Height:  5\' 2"  (157.5 cm)  BEHAVIORAL SYMPTOMS/MOOD NEUROLOGICAL BOWEL NUTRITION STATUS      Incontinent Supplemental, Diet  AMBULATORY STATUS COMMUNICATION OF NEEDS Skin   Extensive  Assist Non-Verbally Normal                       Personal Care Assistance Level of Assistance  Total care       Total Care Assistance: Maximum assistance   Functional Limitations Info             SPECIAL CARE FACTORS FREQUENCY  PT (By licensed PT), OT (By licensed OT)     PT Frequency: 5x/week OT Frequency: 3x/week            Contractures Contractures Info: Not present    Additional Factors Info  Code Status Code Status Info: full             Current Medications (06/01/2019):  This is the current hospital active medication list Current Facility-Administered Medications  Medication Dose Route Frequency Provider Last Rate Last Dose  . 0.9 %  sodium chloride infusion  250 mL Intravenous PRN 13/06/2019, MD      . acetaminophen (TYLENOL) tablet 650 mg  650 mg Oral Q6H PRN Haydee Monica, MD      . chlorpheniramine-HYDROcodone (TUSSIONEX) 10-8 MG/5ML suspension 5 mL  5 mL Oral Q12H PRN Haydee Monica A, MD      . dextrose 5 % and 0.45 % NaCl with KCl 10 mEq/L infusion   Intravenous Continuous Tarry Kos, MD 90 mL/hr at 06/01/19 1035    . feeding supplement (ENSURE ENLIVE) (ENSURE ENLIVE) liquid 237 mL  237 mL Oral BID BM 13/12/20., MD   237 mL at 06/01/19 1247  . guaiFENesin-dextromethorphan (ROBITUSSIN  DM) 100-10 MG/5ML syrup 10 mL  10 mL Oral Q4H PRN Phillips Grout, MD      . heparin injection 5,000 Units  5,000 Units Subcutaneous Q8H Elodia Florence., MD   5,000 Units at 06/01/19 1250  . insulin aspart (novoLOG) injection 0-9 Units  0-9 Units Subcutaneous Q4H Elodia Florence., MD   2 Units at 06/01/19 1241  . memantine (NAMENDA) tablet 10 mg  10 mg Oral BID Elodia Florence., MD   10 mg at 05/31/19 1005  . mirtazapine (REMERON) tablet 15 mg  15 mg Oral QHS Elodia Florence., MD   15 mg at 05/30/19 2131  . ondansetron (ZOFRAN) tablet 4 mg  4 mg Oral Q6H PRN Phillips Grout, MD       Or  . ondansetron (ZOFRAN) injection  4 mg  4 mg Intravenous Q6H PRN Phillips Grout, MD      . pantoprazole (PROTONIX) EC tablet 40 mg  40 mg Oral BID Elodia Florence., MD   40 mg at 05/31/19 1005  . polyethylene glycol (MIRALAX / GLYCOLAX) packet 17 g  17 g Oral Daily Elodia Florence., MD   17 g at 05/29/19 0800  . pravastatin (PRAVACHOL) tablet 20 mg  20 mg Oral q1800 Elodia Florence., MD   20 mg at 05/31/19 1704  . sodium chloride flush (NS) 0.9 % injection 3 mL  3 mL Intravenous Q12H Derrill Kay A, MD   3 mL at 06/01/19 1022  . sodium chloride flush (NS) 0.9 % injection 3 mL  3 mL Intravenous PRN Phillips Grout, MD   3 mL at 06/01/19 0106  . vitamin C (ASCORBIC ACID) tablet 500 mg  500 mg Oral Daily Derrill Kay A, MD   500 mg at 05/31/19 1005  . zinc sulfate capsule 220 mg  220 mg Oral Daily Phillips Grout, MD   220 mg at 05/31/19 1005     Discharge Medications: Please see discharge summary for a list of discharge medications.  Relevant Imaging Results:  Relevant Lab Results:   Additional Information [id number]  Ninfa Meeker, RN

## 2019-06-02 DIAGNOSIS — N1831 Chronic kidney disease, stage 3a: Secondary | ICD-10-CM

## 2019-06-02 DIAGNOSIS — G9341 Metabolic encephalopathy: Secondary | ICD-10-CM

## 2019-06-02 DIAGNOSIS — R410 Disorientation, unspecified: Secondary | ICD-10-CM

## 2019-06-02 LAB — GLUCOSE, CAPILLARY
Glucose-Capillary: 182 mg/dL — ABNORMAL HIGH (ref 70–99)
Glucose-Capillary: 152 mg/dL — ABNORMAL HIGH (ref 70–99)
Glucose-Capillary: 125 mg/dL — ABNORMAL HIGH (ref 70–99)
Glucose-Capillary: 144 mg/dL — ABNORMAL HIGH (ref 70–99)

## 2019-06-02 LAB — C-REACTIVE PROTEIN: CRP: 6.7 mg/dL — ABNORMAL HIGH (ref <1.0)

## 2019-06-02 LAB — BASIC METABOLIC PANEL
Anion gap: 9 (ref 5–15)
BUN: 8 mg/dL (ref 8–23)
CO2: 21 mmol/L — ABNORMAL LOW (ref 22–32)
Calcium: 8 mg/dL — ABNORMAL LOW (ref 8.9–10.3)
Chloride: 107 mmol/L (ref 98–111)
Creatinine, Ser: 0.99 mg/dL (ref 0.44–1.00)
GFR calc Af Amer: >60 mL/min (ref >60)
GFR calc non Af Amer: 55 mL/min — ABNORMAL LOW (ref >60)
Glucose, Bld: 199 mg/dL — ABNORMAL HIGH (ref 70–99)
Potassium: 3.2 mmol/L — ABNORMAL LOW (ref 3.5–5.1)
Sodium: 137 mmol/L (ref 135–145)

## 2019-06-02 LAB — MAGNESIUM: Magnesium: 1.4 mg/dL — ABNORMAL LOW (ref 1.7–2.4)

## 2019-06-02 MED ORDER — MAGNESIUM SULFATE 2 GM/50ML IV SOLN
2.0000 g | Freq: Once | INTRAVENOUS | Status: AC
  Administered 2019-06-02: 2 g via INTRAVENOUS
  Filled 2019-06-02: qty 50

## 2019-06-02 MED ORDER — POTASSIUM CHLORIDE 10 MEQ/100ML IV SOLN
10.0000 meq | INTRAVENOUS | Status: AC
  Administered 2019-06-02 (×4): 10 meq via INTRAVENOUS
  Filled 2019-06-02 (×2): qty 100

## 2019-06-02 NOTE — Progress Notes (Signed)
Report called to [facility] . 

## 2019-06-02 NOTE — Care Management Important Message (Signed)
Important Message  Patient Details  Name: [name] [name] MRN: [id number] Date of Birth: Sep 15, 1940   Medicare Important Message Given:  Yes - Important Message mailed due to current National Emergency  Verbal consent obtained due to current National Emergency  Relationship to patient: Child Contact Name: [name] Call Date: 06/02/19  Time: 1037 Phone: [phone] Outcome: No Answer/Busy Important Message mailed to: Patient address on file    Delorse Lek 06/02/2019, 10:38 AM

## 2019-06-02 NOTE — Discharge Summary (Signed)
Physician Discharge Summary  [name] [name] ZOX:[id number] DOB: 1941/05/25 DOA: 05/26/2019  PCP: Karie Schwalbe, MD  Admit date: 05/26/2019 Discharge date: 06/02/2019  Admitted From: [name] [name] Disposition: [facility] [place]   Recommendations for Outpatient Follow-up:  1. Follow up with PCP in 1-2 weeks 2. Please obtain BMP/CBC in one week 3. TSH 5.120. Free T4 wnl at 0.97. Consider recheck.   Home Health: N/A Equipment/Devices: Per SNF Discharge Condition: Stable CODE STATUS: Full Diet recommendation: Dysphagia 1  Brief/Interim Summary: [name] A Dreweryis a 78 y.o.femalewith medical history significant ofdementia, CAD, CVA, HTN, DM, and recent diagnosis of covid-19 on 10/28 at SNF whopresented to the ED with weakness, fever and vomiting. She is severely demented and bedbound at baseline due to also h/o cva.   She was found to have klebsiella UTI and has improved on abx. She also had evidence of COVID 19 pneumonia, but has never had an O2 requirement or pulmonary symptoms, so now targeted treatment was given. Hospitalization has been complicated by delirium which seems to be slowly improving. Please see below for further details.  Discharge Diagnoses:  Principal Problem:   UTI (urinary tract infection) Active Problems:   H/O: CVA (cerebrovascular accident)   Diabetes mellitus, type 2 (HCC)   CKD (chronic kidney disease)   Acute delirium   Alzheimer's disease (HCC)   ARF (acute renal failure) (HCC)   Pneumonia due to COVID-19 virus   Acute metabolic encephalopathy  Klebsiella UTI:  - Completed course of abx (CTX then ancef) per urine culture data, resistant only to ampicillin. No abscess/complication noted on CT abd/pelvis. No leukocytosis.  AKI on stage IIIa CKD (based on available creatinine data): Resolved, Cr back to baseline. - Avoid nephrotoxins and monitor renal function closely.   Covid-19 pneumonia: With bibasilar infiltrates (?vs. atelectasis) without  hypoxia.  - Has remained without cough, dyspnea, hypoxia. Recommend initiating steroids only if she becomes hypoxic to limit effect on AMS. Note inflammatory markers elevated but trending downward spontaneously. - Patient was admitted presumably due to UTI but cannot have covid-19 ruled out as cause of admission, therefore would qualify as "severe" disease. So would recommend isolation for 21 days from positive test result.  Acute metabolic encephalopathy on chronic dementia: Suspect the only way to improve this might be discharge out of acute care facility.  - Delirium precautions.  - Continue namenda, remeron, neurontin  T2DM: Uncontrolled with hypoglycemia due to poor per oral intake. HbA1c 11.6% indicates poor long term control.  - Stopped basal insulin for now. Once po intake continues improving, would reinitiate lantus. - Continue SSI with no hypoglycemia.  Concern for GI bleed: Coffee-ground emesis reported in ED in the setting of several episodes of vomiting prior. Guiac negative per EDP.  Hb stable without anemia or BUN elevation, arguing against GIB.  - Continue po PPI and monitoring. No further episodes reported.  Hypomagnesemia:  - Replacement ordered, recommend continued monitoring.  Hypokalemia:  - Replacement ordered, would recommend continued monitoring.   Hypothyroidism: TSH 5.120. Free T4 wnl at 0.97.  - Outpatient follow up. Continue synthroid if taking at facility (previous dose was though no dose recorded on facility [facility])  Hyperlipidemia:  - Continue statin  CAD: No chest pain - Continue home medications  History of CVA:  - Continue home medications.    GERD:  - Continue PPI   Discharge Instructions  Allergies as of 06/02/2019      Reactions   Fexofenadine Hcl Rash  Medication List    STOP taking these medications   aspirin 81 MG chewable tablet   insulin glargine 100 UNIT/ML injection Commonly known as: LANTUS    nitrofurantoin (macrocrystal-monohydrate) 100 MG capsule Commonly known as: MACROBID     TAKE these medications   acetaminophen 325 MG tablet Commonly known as: TYLENOL Take 650 mg by mouth every 4 (four) hours as needed.   aluminum hydroxide ointment Commonly known as: DERMAGRAN Apply topically every 12 (twelve) hours as needed. Skin protectant to buttocks   citalopram 10 MG tablet Commonly known as: CELEXA Take 5 mg by mouth daily.   gabapentin 300 MG capsule Commonly known as: NEURONTIN Take 300 mg by mouth every evening.   guaiFENesin 100 MG/5ML liquid Commonly known as: ROBITUSSIN Take 200 mg by mouth every 4 (four) hours as needed for cough.   hydroxypropyl methylcellulose / hypromellose 2.5 % ophthalmic solution Commonly known as: ISOPTO TEARS / GONIOVISC Place 1 drop into both eyes 3 (three) times daily as needed for dry eyes.   insulin regular 100 units/mL injection Commonly known as: NOVOLIN R Inject 0-16 Units into the skin every 4 (four) hours as needed for high blood sugar. Sliding Scale: 70-130 (0 unit);131-180 (4units); 181-240 (8 units); 241-300 (10 units); 301-350 (12 units); 351-400 (14 units); >401 (16 units)   levothyroxine 88 MCG tablet Commonly known as: SYNTHROID Take 88 mcg by mouth every evening.   lovastatin 20 MG tablet Commonly known as: MEVACOR Take 1 tablet (20 mg total) by mouth every evening.   memantine 10 MG tablet Commonly known as: NAMENDA Take 10 mg by mouth 2 (two) times daily.   mirtazapine 15 MG tablet Commonly known as: REMERON Take 15 mg by mouth at bedtime.   omeprazole 20 MG capsule Commonly known as: PRILOSEC Take 20 mg by mouth 2 (two) times daily.   ondansetron 4 MG tablet Commonly known as: ZOFRAN Take 4 mg by mouth daily as needed for nausea or vomiting.   polyethylene glycol 17 g packet Commonly known as: MIRALAX / GLYCOLAX Take 17 g by mouth daily.   Ricola Cherry Honey Herb 2 MG Lozg Generic drug:  Menthol Use as directed 1 lozenge in the mouth or throat every 2 (two) hours as needed (FOR COUGH).   senna-docusate 8.6-50 MG tablet Commonly known as: Senokot-S Take 2 tablets by mouth daily.   Vitamin D3 1.25 MG (50000 UT) Caps Take 1 capsule by mouth every 30 (thirty) days.      Follow-up Information    Venia Carbon, MD Follow up.   Specialties: Internal Medicine, Pediatrics Contact information: [place] [address] [phone]          Allergies  Allergen Reactions  . Fexofenadine Hcl Rash    Consultations:  None  Procedures/Studies: Ct Abdomen Pelvis Wo Contrast  Result Date: 05/28/2019 CLINICAL DATA:  Renal failure, acute(kidneyinjury) Limited mobility, AMS, unable to raise arms/follow direction, immobilization used EXAM: CT ABDOMEN AND PELVIS WITHOUT CONTRAST TECHNIQUE: Multidetector CT imaging of the abdomen and pelvis was performed following the standard protocol without IV contrast. COMPARISON:  CT abdomen pelvis FINDINGS: Study is significantly degraded by motion artifact and lack of IV contrast. Lower chest: Coronary artery calcification. Normal heart size no pericardial or pleural effusion. There are bilateral multifocal pulmonary opacities suspicious for infection. Hepatobiliary: No definite focal liver lesion. Small amount of layering gallstones. No evidence of gallbladder wall thickening. Pancreas: Unremarkable Spleen: Normal size. Probable adjacent small splenule. Adrenals/Urinary Tract:  Normal appearance of the bilateral renal glands. Mild atrophy of the right kidney. No evidence of renal calculi no hydronephrosis. No large mass identified. Urinary bladder is unremarkable. Stomach/Bowel: Stomach is within normal limits. Appendix appears normal. No evidence of bowel wall thickening, distention, or inflammatory changes. Colonic diverticula without evidence of diverticulitis. Vascular/Lymphatic: Severe aortoiliac atherosclerotic  calcification. No evidence of aneurysm. Vascular patency cannot be assessed in the absence of IV contrast. Reproductive: No adnexal masses. Other: No abdominal wall hernia or abnormality. No abdominopelvic ascites. Musculoskeletal: No acute or significant osseous findings. IMPRESSION: 1. Study is significantly degraded by motion artifact and lack of IV contrast. 2. Bilateral multifocal pulmonary opacities in the visualized lung bases, suspicious for infection. 3. Cholelithiasis without evidence of cholecystitis. 4. Mild atrophy of the right kidney. No renal calculi or hydronephrosis. 5. Severe aortoiliac atherosclerotic calcification. Vascular patency cannot be assessed in the absence of IV contrast. Electronically Signed   By: Emmaline Kluver M.D.   On: 05/28/2019 18:55   Dg Chest Port 1 View  Result Date: 05/28/2019 CLINICAL DATA:  COVID-19 positive. EXAM: PORTABLE CHEST 1 VIEW COMPARISON:  05/26/2019 FINDINGS: Patient slightly rotated to the right as the lungs are adequately inflated. Subtle patchy density over the mid to lower lungs with slight interval improvement. No definite effusion. Mild stable cardiomegaly. Remainder of the exam is unchanged. IMPRESSION: Subtle patchy density over the mid to lower lungs with interval improvement. Electronically Signed   By: Elberta Fortis M.D.   On: 05/28/2019 09:02   Dg Chest Portable 1 View  Result Date: 05/26/2019 CLINICAL DATA:  Episode of coffee-ground emesis this morning, GI bleed, COVID-19 EXAM: PORTABLE CHEST 1 VIEW COMPARISON:  Portable exam 1129 hours compared to 10/27/2018 FINDINGS: Enlargement of cardiac silhouette. Atherosclerotic calcification aorta. Mediastinal contours and pulmonary vascularity normal. Scattered infiltrates at the mid to lower lungs bilaterally. Associated mild atelectasis at LEFT base. No pleural effusion or pneumothorax. IMPRESSION: Hazy bibasilar infiltrates and LEFT basilar subsegmental atelectasis. Enlargement of cardiac  silhouette. Aortic Atherosclerosis (ICD10-I70.0). Electronically Signed   By: Ulyses Southward M.D.   On: 05/26/2019 11:58   Subjective: No complaints, remains confused and refusing per oral intake most of the time but denies nausea or abdominal pain. No vomiting or diarrhea/constipation noted either.   Discharge Exam: Vitals:   06/01/19 2055 06/02/19 0419  BP: (!) 87/37 97/85  Pulse: 81 86  Resp: 18 16  Temp: 98.8 F (37.1 C) 98.6 F (37 C)  SpO2: 100% 99%   General: Pt is alert, awake, not in acute distress Cardiovascular: RRR, no LE edema Respiratory: Nonlabored and clear Abdominal: Soft, NT, ND, bowel sounds +  Labs: Basic Metabolic Panel: Recent Labs  Lab 05/29/19 0830 05/30/19 0633 05/31/19 0115 06/01/19 0530 06/02/19 0419  NA 144 137 141 143 137  K 3.9 3.5 3.6 3.4* 3.2*  CL 113* 106 107 109 107  CO2 20* 21* 21* 22 21*  GLUCOSE 154* 134* 69* 127* 199*  BUN 28* 14 10 13 8   CREATININE 1.19* 0.95 0.93 1.27* 0.99  CALCIUM 8.0* 7.7* 8.5* 8.6* 8.0*  MG 1.6*  --   --   --  1.4*   Liver Function Tests: Recent Labs  Lab 05/28/19 0340 05/29/19 0830 05/30/19 0633 05/31/19 0115 06/01/19 0530  AST 23 21 20 21 21   ALT 19 14 13 12 8   ALKPHOS 69 66 63 63 62  BILITOT 0.8 0.4 0.8 0.6 0.7  PROT 6.8 5.7* 6.0* 6.3* 6.1*  ALBUMIN 2.9* 2.4*  2.4* 2.5* 2.7*   CBC: Recent Labs  Lab 05/28/19 0340 05/29/19 0830 05/30/19 0633 05/31/19 0115 06/01/19 0530  WBC 10.9* 6.5 7.2 7.0 8.0  NEUTROABS 8.4* 4.1 4.8 4.7 5.0  HGB 12.9 12.2 12.4 13.4 13.0  HCT 40.0 38.1 38.5 41.8 40.2  MCV 86.4 88.6 86.5 86.4 84.8  PLT 321 253 261 279 279   CBG: Recent Labs  Lab 06/01/19 0514 06/01/19 1211 06/01/19 1644 06/01/19 2058 06/02/19 0407  GLUCAP 121* 163* 91 78 182*   D-Dimer Recent Labs    05/31/19 0115 06/01/19 0530  DDIMER 2.59* 1.80*   Anemia work up Recent Labs    05/31/19 0115  FERRITIN 1,158*   Urinalysis    Component Value Date/Time   COLORURINE AMBER (A)  05/26/2019 1121   APPEARANCEUR CLOUDY (A) 05/26/2019 1121   APPEARANCEUR Clear 11/16/2013 2342   LABSPEC 1.019 05/26/2019 1121   LABSPEC 1.019 11/16/2013 2342   PHURINE 5.0 05/26/2019 1121   GLUCOSEU 150 (A) 05/26/2019 1121   GLUCOSEU 150 mg/dL 16/10/960404/30/2015 [id number]   HGBUR SMALL (A) 05/26/2019 1121   BILIRUBINUR NEGATIVE 05/26/2019 1121   BILIRUBINUR Negative 11/16/2013 2342   KETONESUR 5 (A) 05/26/2019 1121   PROTEINUR 30 (A) 05/26/2019 1121   UROBILINOGEN 0.2 09/22/2011 1412   NITRITE NEGATIVE 05/26/2019 1121   LEUKOCYTESUR LARGE (A) 05/26/2019 1121   LEUKOCYTESUR Negative 11/16/2013 2342    Microbiology Recent Results (from the past 240 hour(s))  Urine culture     Status: Abnormal   Collection Time: 05/26/19 11:21 AM   Specimen: Urine, Catheterized  Result Value Ref Range Status   Specimen Description   Final    URINE, CATHETERIZED Performed at [facility], [address] [place], [place] [address]    Special Requests   Final    NONE Performed at [facility], [address] [place], [place] [address]    Culture >=100,000 COLONIES/mL KLEBSIELLA PNEUMONIAE (A)  Final   Report Status 05/28/2019 FINAL  Final   Organism ID, Bacteria KLEBSIELLA PNEUMONIAE (A)  Final      Susceptibility   Klebsiella pneumoniae - MIC*    AMPICILLIN >=32 RESISTANT Resistant     CEFAZOLIN <=4 SENSITIVE Sensitive     CEFTRIAXONE <=1 SENSITIVE Sensitive     CIPROFLOXACIN <=0.25 SENSITIVE Sensitive     GENTAMICIN <=1 SENSITIVE Sensitive     IMIPENEM <=0.25 SENSITIVE Sensitive     NITROFURANTOIN 32 SENSITIVE Sensitive     TRIMETH/SULFA <=20 SENSITIVE Sensitive     AMPICILLIN/SULBACTAM 4 SENSITIVE Sensitive     PIP/TAZO <=4 SENSITIVE Sensitive     Extended ESBL NEGATIVE Sensitive     * >=100,000 COLONIES/mL KLEBSIELLA PNEUMONIAE  Blood culture (routine x 2)     Status: None   Collection Time: 05/26/19 11:21 AM   Specimen: BLOOD  Result Value Ref Range Status   Specimen  Description BLOOD L FA  Final   Special Requests   Final    BOTTLES DRAWN AEROBIC AND ANAEROBIC Blood Culture adequate volume   Culture   Final    NO GROWTH 5 DAYS Performed at [facility], [address] [place], [place] [address]    Report Status 05/31/2019 FINAL  Final  SARS CORONAVIRUS 2 (TAT 6-24 HRS) Nasopharyngeal     Status: Abnormal   Collection Time: 05/26/19 11:31 AM   Specimen: Nasopharyngeal  Result Value Ref Range Status   SARS Coronavirus 2 POSITIVE (A) NEGATIVE Final    Comment: RESULT CALLED TO, READ  BACK BY AND VERIFIED WITH: [name] 1820 05/26/2019 [name] (NOTE) SARS-CoV-2 target nucleic acids are DETECTED. The SARS-CoV-2 RNA is generally detectable in upper and lower respiratory specimens during the acute phase of infection. Positive results are indicative of active infection with SARS-CoV-2. Clinical  correlation with patient history and other diagnostic information is necessary to determine patient infection status. Positive results do  not rule out bacterial infection or co-infection with other viruses. The expected result is Negative. Fact Sheet for Patients: HairSlick.no Fact Sheet for Healthcare Providers: quierodirigir.com This test is not yet approved or cleared by the [place] FDA and  has been authorized for detection and/or diagnosis of SARS-CoV-2 by FDA under an Emergency Use Authorization (EUA). This EUA will remain  in effect (meaning this test can be used)  for the duration of the COVID-19 declaration under Section 564(b)(1) of the Act, 21 U.S.C. section 360bbb-3(b)(1), unless the authorization is terminated or revoked sooner. Performed at [facility], [address], [place], [place] [address]   Blood culture (routine x 2)     Status: None   Collection Time: 05/26/19  4:24 PM   Specimen: BLOOD  Result Value Ref Range Status   Specimen Description BLOOD  BLOOD LEFT FOREARM  Final   Special Requests   Final    BOTTLES DRAWN AEROBIC AND ANAEROBIC Blood Culture adequate volume   Culture   Final    NO GROWTH 5 DAYS Performed at [facility], [address], [place], [place] [address]    Report Status 05/31/2019 FINAL  Final    Time coordinating discharge: Approximately 40 minutes  Tyrone Nine, MD  [facility] 06/02/2019, 7:51 AM

## 2019-06-02 NOTE — TOC Transition Note (Signed)
Transition of Care [facility]) - CM/SW Discharge Note   Patient Details  Name: [name] [name] MRN: [id number] Date of Birth: 1940/12/08  Transition of [facility]) CM/SW Contact:  Ninfa Meeker, RN Phone Number: [phone] (working remotely) 06/02/2019, 9:49 AM   Clinical Narrative:    Patient will DC to: [facility] Anticipated DC date: 06/02/19 Family notified: Daughter: [name]: [phone] Transport by: Solon [place]   Patient is medically ready for discharge per MD. Cas manager has contacted patient's daughter-[name], she is requesting to speak with Dr. Prior to her mom being discharged for update. Case manager has informed Camera operator, bedside RN and facility of discharge plan. Discharge Summary, FL2, have been sent via the HUB to [facility]. Medical Necessity and Face Sheet have been printed to the unit. Bedside RN to call report to [phone], patient will go to room 205B. Ambulance transport requested for 12Noon.    Final next level of care: [place] Barriers to Discharge: No Barriers Identified   Patient Goals and CMS Choice     Choice offered to / list presented to : Adult Children  Discharge Placement                       Discharge Plan and Services   Discharge Planning Services: CM Consult Post Acute Care Choice: [facility]          DME Arranged: N/A DME Agency: NA       HH Arranged: NA HH Agency: NA        Social Determinants of Health (SDOH) Interventions     Readmission Risk Interventions Readmission Risk Prevention Plan 10/31/2018  Transportation Screening Complete  Medication Review Press photographer) Complete  PCP or Specialist appointment within 3-5 days of discharge Complete  HRI or Home Care Consult Complete  SW Recovery Care/Counseling Consult Patient refused  Palliative Care Screening Complete  Skilled Nursing Facility Complete  Some recent data might be hidden

## 2019-06-02 NOTE — Progress Notes (Signed)
Daughter is updated and all questions answered. Pt expected to be d/c at noon.

## 2019-06-06 DIAGNOSIS — E1142 Type 2 diabetes mellitus with diabetic polyneuropathy: Secondary | ICD-10-CM | POA: Diagnosis not present

## 2019-06-06 DIAGNOSIS — U071 COVID-19: Secondary | ICD-10-CM | POA: Diagnosis not present

## 2019-06-06 DIAGNOSIS — N390 Urinary tract infection, site not specified: Secondary | ICD-10-CM | POA: Diagnosis not present

## 2019-06-06 DIAGNOSIS — K219 Gastro-esophageal reflux disease without esophagitis: Secondary | ICD-10-CM | POA: Diagnosis not present

## 2019-06-19 DIAGNOSIS — L98411 Non-pressure chronic ulcer of buttock limited to breakdown of skin: Secondary | ICD-10-CM | POA: Diagnosis not present

## 2019-07-05 DIAGNOSIS — K219 Gastro-esophageal reflux disease without esophagitis: Secondary | ICD-10-CM

## 2019-07-05 DIAGNOSIS — F0150 Vascular dementia without behavioral disturbance: Secondary | ICD-10-CM | POA: Diagnosis not present

## 2019-07-05 DIAGNOSIS — I69351 Hemiplegia and hemiparesis following cerebral infarction affecting right dominant side: Secondary | ICD-10-CM | POA: Diagnosis not present

## 2019-07-05 DIAGNOSIS — E1140 Type 2 diabetes mellitus with diabetic neuropathy, unspecified: Secondary | ICD-10-CM | POA: Diagnosis not present

## 2019-07-05 DIAGNOSIS — F39 Unspecified mood [affective] disorder: Secondary | ICD-10-CM | POA: Diagnosis not present

## 2019-07-05 DIAGNOSIS — E039 Hypothyroidism, unspecified: Secondary | ICD-10-CM

## 2019-08-17 DIAGNOSIS — B372 Candidiasis of skin and nail: Secondary | ICD-10-CM

## 2019-08-28 DIAGNOSIS — G8191 Hemiplegia, unspecified affecting right dominant side: Secondary | ICD-10-CM

## 2019-08-28 DIAGNOSIS — E1142 Type 2 diabetes mellitus with diabetic polyneuropathy: Secondary | ICD-10-CM

## 2019-08-28 DIAGNOSIS — F39 Unspecified mood [affective] disorder: Secondary | ICD-10-CM

## 2019-08-28 DIAGNOSIS — F0151 Vascular dementia with behavioral disturbance: Secondary | ICD-10-CM

## 2019-08-28 DIAGNOSIS — K219 Gastro-esophageal reflux disease without esophagitis: Secondary | ICD-10-CM

## 2019-08-30 DIAGNOSIS — B372 Candidiasis of skin and nail: Secondary | ICD-10-CM

## 2019-08-30 DIAGNOSIS — L89159 Pressure ulcer of sacral region, unspecified stage: Secondary | ICD-10-CM

## 2019-09-08 DIAGNOSIS — L89620 Pressure ulcer of left heel, unstageable: Secondary | ICD-10-CM | POA: Diagnosis not present

## 2019-10-03 DIAGNOSIS — L97429 Non-pressure chronic ulcer of left heel and midfoot with unspecified severity: Secondary | ICD-10-CM | POA: Diagnosis not present

## 2019-11-08 DIAGNOSIS — F39 Unspecified mood [affective] disorder: Secondary | ICD-10-CM

## 2019-11-08 DIAGNOSIS — I69351 Hemiplegia and hemiparesis following cerebral infarction affecting right dominant side: Secondary | ICD-10-CM

## 2019-11-08 DIAGNOSIS — F0150 Vascular dementia without behavioral disturbance: Secondary | ICD-10-CM | POA: Diagnosis not present

## 2019-11-08 DIAGNOSIS — E1140 Type 2 diabetes mellitus with diabetic neuropathy, unspecified: Secondary | ICD-10-CM

## 2019-11-08 DIAGNOSIS — K219 Gastro-esophageal reflux disease without esophagitis: Secondary | ICD-10-CM

## 2019-11-08 DIAGNOSIS — B351 Tinea unguium: Secondary | ICD-10-CM

## 2019-11-08 DIAGNOSIS — E039 Hypothyroidism, unspecified: Secondary | ICD-10-CM

## 2019-12-29 ENCOUNTER — Inpatient Hospital Stay (HOSPITAL_BASED_OUTPATIENT_CLINIC_OR_DEPARTMENT_OTHER): Payer: Medicare Other | Admitting: Oncology

## 2019-12-29 ENCOUNTER — Other Ambulatory Visit: Payer: Self-pay

## 2019-12-29 ENCOUNTER — Inpatient Hospital Stay: Payer: Medicare Other | Attending: Oncology

## 2019-12-29 ENCOUNTER — Encounter: Payer: Self-pay | Admitting: Oncology

## 2019-12-29 VITALS — BP 141/93 | HR 96

## 2019-12-29 DIAGNOSIS — D509 Iron deficiency anemia, unspecified: Secondary | ICD-10-CM | POA: Diagnosis present

## 2019-12-29 DIAGNOSIS — D500 Iron deficiency anemia secondary to blood loss (chronic): Secondary | ICD-10-CM

## 2019-12-29 DIAGNOSIS — F0390 Unspecified dementia without behavioral disturbance: Secondary | ICD-10-CM | POA: Insufficient documentation

## 2019-12-29 DIAGNOSIS — I129 Hypertensive chronic kidney disease with stage 1 through stage 4 chronic kidney disease, or unspecified chronic kidney disease: Secondary | ICD-10-CM | POA: Insufficient documentation

## 2019-12-29 DIAGNOSIS — Z79899 Other long term (current) drug therapy: Secondary | ICD-10-CM | POA: Diagnosis not present

## 2019-12-29 DIAGNOSIS — E039 Hypothyroidism, unspecified: Secondary | ICD-10-CM | POA: Insufficient documentation

## 2019-12-29 DIAGNOSIS — K317 Polyp of stomach and duodenum: Secondary | ICD-10-CM | POA: Insufficient documentation

## 2019-12-29 DIAGNOSIS — E1122 Type 2 diabetes mellitus with diabetic chronic kidney disease: Secondary | ICD-10-CM | POA: Insufficient documentation

## 2019-12-29 DIAGNOSIS — Z7982 Long term (current) use of aspirin: Secondary | ICD-10-CM | POA: Insufficient documentation

## 2019-12-29 DIAGNOSIS — N189 Chronic kidney disease, unspecified: Secondary | ICD-10-CM | POA: Diagnosis not present

## 2019-12-29 NOTE — Progress Notes (Signed)
Medications reviewed and updated per summary report brought in from [facility].  We were not able to obtain vitals other than a weight in the office today.  The lab was unable to obtain labs.

## 2019-12-29 NOTE — Progress Notes (Signed)
Hematology/Oncology follow up note [name] [name] Telephone:([phone]) [phone] Fax:([phone]) [phone]   Patient Care Team: [name] Carbon, MD as PCP - General (Internal Medicine)  REFERRING PROVIDER: Dr. Marius [name] CHIEF COMPLAINTS/REASON FOR VISIT:  Evaluation of iron deficiency anemia  HISTORY OF PRESENTING ILLNESS:  [name] [name] is a  79 y.o.  female with PMH listed below who was referred to me for evaluation of iron deficiency anemia Patient has advanced dementia, not able to provide any history.  No family present.  Accompanied by caregiver from facility.  She is napping comfortably in her wheelchair not able to have a conversation with me, nonverbal.  I have to call patient's power of attorney daughter [name] [name] and main history was obtained from medical chart review and conversation with [name] [name].  She is a long-term [facility] home resident.  Per daughter, patient was able to have simple conversation with family members at baseline and to recently she appears to be more fatigued and sleeps a lot.  Patient was admitted from 08/21/2020 08/23/2018.  Patient was sent to emergency room after coffee-ground emesis noted by staff at the facility.    She was seen and evaluated by gastroenterology Dr. Marius [name] during the hospitalization.  08/22/2018 upper endoscopy showed normal duodenal bulb and second portion of duodenum.  Multiple gastric polyps.  Medium size hiatal hernia.  Nonbleeding esophageal ulcer.  Grade B reflux esophagitis.  No specimens collected.  Review patient's medication list from nursing home.  She is on aspirin 81 mg daily, ferrous sulfate 325 mg daily with breakfast.  Protonix 40 mg twice daily.  INTERVAL HISTORY [name] [name] is a 79 y.o. female who has above history reviewed by me today presents for follow up visit for management of iron deficiency anemia Problems and complaints are listed below: Patient is demented, not able to obtain meaningful  history.  She was accompanied by staff from her nursing facility.  Lab was not able to get blood drawn despite multiple attempts.   Review of Systems  Unable to perform ROS: Dementia    MEDICAL HISTORY:  Past Medical History:  Diagnosis Date  . Allergic rhinitis   . CAD (coronary artery disease)   . Carotid artery occlusion 02/15/2009  . CVA (cerebral infarction)   . Dementia ([place])   . Diabetes mellitus   . Gastritis   . GERD (gastroesophageal reflux disease)   . Heart murmur   . Hematemesis   . Hemiplegia ([place])   . HLD (hyperlipidemia)   . Hypertension   . Hypertensive chronic kidney disease   . Macular degeneration   . Persistent mood (affective) disorder, unspecified ([place])   . Stroke ([place])   . Thyroid disease    hypothyroidism  . Ulcer of esophagus without bleeding     SURGICAL HISTORY: Past Surgical History:  Procedure Laterality Date  . APPENDECTOMY    . ESOPHAGOGASTRODUODENOSCOPY Left 10/17/2015   Procedure: ESOPHAGOGASTRODUODENOSCOPY (EGD);  Surgeon: [name] Silvas, MD;  Location: [facility] ENDOSCOPY;  Service: Endoscopy;  Laterality: Left;  . ESOPHAGOGASTRODUODENOSCOPY N/A 08/22/2018   Procedure: ESOPHAGOGASTRODUODENOSCOPY (EGD);  Surgeon: [name] Landsman, MD;  Location: [facility] ENDOSCOPY;  Service: Gastroenterology;  Laterality: N/A;  . TONSILLECTOMY      SOCIAL HISTORY: Social History   Socioeconomic History  . Marital status: Divorced    Spouse name: Not on file  . Number of children: Not on file  . Years of education: Not on file  . Highest education level: Not on file  Occupational History  . Occupation: retired  Tobacco Use  . Smoking status: Former Smoker    Types: Cigarettes    Quit date: 07/20/1985    Years since quitting: 34.4  . Smokeless tobacco: Never Used  Vaping Use  . Vaping Use: Never used  Substance and Sexual Activity  . Alcohol use: No  . Drug use: No  . Sexual activity: Never    Birth control/protection: Post-menopausal  Other  Topics Concern  . Not on file  Social History Narrative  . Not on file   Social Determinants of Health   Financial Resource Strain:   . Difficulty of Paying Living Expenses:   Food Insecurity:   . Worried About Programme researcher, broadcasting/film/video in the Last Year:   . Barista in the Last Year:   Transportation Needs:   . Freight forwarder (Medical):   Marland Kitchen Lack of Transportation (Non-Medical):   Physical Activity:   . Days of Exercise per Week:   . Minutes of Exercise per Session:   Stress:   . Feeling of Stress :   Social Connections:   . Frequency of Communication with Friends and Family:   . Frequency of Social Gatherings with Friends and Family:   . Attends Religious Services:   . Active Member of Clubs or Organizations:   . Attends Banker Meetings:   Marland Kitchen Marital Status:   Intimate Partner Violence:   . Fear of Current or Ex-Partner:   . Emotionally Abused:   Marland Kitchen Physically Abused:   . Sexually Abused:     FAMILY HISTORY: Family History  Problem Relation Age of Onset  . Diabetes Mother   . Hypertension Mother   . Diabetes Father   . Hypertension Father   . Diabetes Sister   . Hypertension Sister     ALLERGIES:  is allergic to fexofenadine hcl.  MEDICATIONS:  Current Outpatient Medications  Medication Sig Dispense Refill  . acetaminophen (TYLENOL) 325 MG tablet Take 650 mg by mouth every 4 (four) hours as needed.    Marland Kitchen aluminum hydroxide (DERMAGRAN) ointment Apply topically every 12 (twelve) hours as needed. Skin protectant to buttocks    . Cholecalciferol (VITAMIN D3) 50000 units CAPS Take 1 capsule by mouth every 30 (thirty) days.     . citalopram (CELEXA) 10 MG tablet Take 5 mg by mouth daily.     Marland Kitchen gabapentin (NEURONTIN) 300 MG capsule Take 300 mg by mouth every evening.     Marland Kitchen guaiFENesin (ROBITUSSIN) 100 MG/5ML liquid Take 200 mg by mouth every 4 (four) hours as needed for cough.    . hydroxypropyl methylcellulose / hypromellose (ISOPTO TEARS /  GONIOVISC) 2.5 % ophthalmic solution Place 1 drop into both eyes 3 (three) times daily as needed for dry eyes.    Marland Kitchen LANTUS SOLOSTAR 100 UNIT/ML Solostar Pen Inject 30 Units into the skin at bedtime.    Marland Kitchen levothyroxine (SYNTHROID, LEVOTHROID) 88 MCG tablet Take 88 mcg by mouth every evening.     . lovastatin (MEVACOR) 20 MG tablet Take 1 tablet (20 mg total) by mouth every evening. 30 tablet 0  . memantine (NAMENDA) 10 MG tablet Take 10 mg by mouth 2 (two) times daily.     . Menthol (RICOLA CHERRY HONEY HERB) 2 MG LOZG Use as directed 1 lozenge in the mouth or throat every 2 (two) hours as needed (FOR COUGH).     . mirtazapine (REMERON) 15 MG tablet Take 15 mg by mouth at  bedtime.     Marland Kitchen omeprazole (PRILOSEC) 20 MG capsule Take 20 mg by mouth 2 (two) times daily.     . ondansetron (ZOFRAN) 4 MG tablet Take 4 mg by mouth daily as needed for nausea or vomiting.     . polyethylene glycol (MIRALAX / GLYCOLAX) packet Take 17 g by mouth daily.     Marland Kitchen senna-docusate (SENOKOT-S) 8.6-50 MG tablet Take 2 tablets by mouth daily.    . insulin regular (NOVOLIN R) 100 units/mL injection Inject 0-16 Units into the skin every 4 (four) hours as needed for high blood sugar. Sliding Scale: 70-130 (0 unit);131-180 (4units); 181-240 (8 units); 241-300 (10 units); 301-350 (12 units); 351-400 (14 units); >401 (16 units) (Patient not taking: Reported on 12/29/2019)     No current facility-administered medications for this visit.     PHYSICAL EXAMINATION: ECOG PERFORMANCE STATUS: 3 - Symptomatic, >50% confined to bed Vitals:   12/29/19 1120  BP: (!) 141/93  Pulse: 96   There were no vitals filed for this visit.  Physical Exam Constitutional:      General: She is not in acute distress.    Appearance: She is not ill-appearing.  HENT:     Head: Normocephalic and atraumatic.  Eyes:     General: No scleral icterus.    Pupils: Pupils are equal, round, and reactive to light.  Cardiovascular:     Rate and Rhythm:  Normal rate and regular rhythm.     Heart sounds: Normal heart sounds.  Pulmonary:     Effort: Pulmonary effort is normal. No respiratory distress.     Breath sounds: Normal breath sounds. No wheezing.  Abdominal:     General: Bowel sounds are normal. There is no distension.     Palpations: Abdomen is soft. There is no mass.     Tenderness: There is no abdominal tenderness.  Musculoskeletal:        General: No deformity. Normal range of motion.     Cervical back: Normal range of motion and neck supple.  Skin:    General: Skin is warm and dry.     Findings: No erythema or rash.  Neurological:     Mental Status: She is alert and oriented to person, place, and time.     Cranial Nerves: No cranial nerve deficit.     Coordination: Coordination normal.     Comments: Pleasantly confused. Not following commands.     RADIOGRAPHIC STUDIES: I have personally reviewed the radiological images as listed and agreed with the findings in the report.  CMP Latest Ref Rng & Units 06/02/2019  Glucose 70 - 99 mg/dL 846(N)  BUN 8 - 23 mg/dL 8  Creatinine 6.29 - 5.28 mg/dL 4.13  Sodium 244 - 010 mmol/L 137  Potassium 3.5 - 5.1 mmol/L 3.2(L)  Chloride 98 - 111 mmol/L 107  CO2 22 - 32 mmol/L 21(L)  Calcium 8.9 - 10.3 mg/dL 8.0(L)  Total Protein 6.5 - 8.1 g/dL -  Total Bilirubin 0.3 - 1.2 mg/dL -  Alkaline Phos 38 - 272 U/L -  AST 15 - 41 U/L -  ALT 0 - 44 U/L -   CBC Latest Ref Rng & Units 06/01/2019  WBC 4.0 - 10.5 K/uL 8.0  Hemoglobin 12.0 - 15.0 g/dL 53.6  Hematocrit 36 - 46 % 40.2  Platelets 150 - 400 K/uL 279    LABORATORY DATA:  I have reviewed the data as listed Lab Results  Component Value Date   WBC 8.0 06/01/2019  HGB 13.0 06/01/2019   HCT 40.2 06/01/2019   MCV 84.8 06/01/2019   PLT 279 06/01/2019   Recent Labs    05/30/19 0633 05/30/19 0633 05/31/19 0115 06/01/19 0530 06/02/19 0419  NA 137   < > 141 143 137  K 3.5   < > 3.6 3.4* 3.2*  CL 106   < > 107 109 107    CO2 21*   < > 21* 22 21*  GLUCOSE 134*   < > 69* 127* 199*  BUN 14   < > 10 13 8   CREATININE 0.95   < > 0.93 1.27* 0.99  CALCIUM 7.7*   < > 8.5* 8.6* 8.0*  GFRNONAA 57*   < > 59* 40* 55*  GFRAA >60   < > >60 47* >60  PROT 6.0*  --  6.3* 6.1*  --   ALBUMIN 2.4*  --  2.5* 2.7*  --   AST 20  --  21 21  --   ALT 13  --  12 8  --   ALKPHOS 63  --  63 62  --   BILITOT 0.8  --  0.6 0.7  --    < > = values in this interval not displayed.   Iron/TIBC/Ferritin/ %Sat    Component Value Date/Time   IRON 92 12/28/2018 0926   TIBC 266 12/28/2018 0926   FERRITIN 1,158 (H) 05/31/2019 0115   IRONPCTSAT 35 (H) 12/28/2018 0926        ASSESSMENT & PLAN:  1. Iron deficiency anemia due to chronic blood loss    Iron deficiency anemia.  Not able to obtain labs today due to poor renal access. Patient appears to be comfortable.  Recommend patient to have CBC, iron, TIBC, ferritin done at nursing home and fax to 02/27/2019. Follow my recommendation on the nursing home paperwork and discussed with the staff that accompanied patient to today's visit. Patient is not on iron tablets as her last iron panel was normal. Follow-up to be determined.  Pending patient's blood work. All questions were answered. The patient knows to call the clinic with any problems questions or concerns. We spent sufficient time to discuss many aspect of care, questions were answered to patient's satisfaction.  [place], MD, PhD Hematology Oncology [facility] Pager- [facility] 12/29/2019

## 2020-01-16 ENCOUNTER — Telehealth: Payer: Self-pay

## 2020-01-16 DIAGNOSIS — E1142 Type 2 diabetes mellitus with diabetic polyneuropathy: Secondary | ICD-10-CM | POA: Diagnosis not present

## 2020-01-16 DIAGNOSIS — K219 Gastro-esophageal reflux disease without esophagitis: Secondary | ICD-10-CM

## 2020-01-16 DIAGNOSIS — G8191 Hemiplegia, unspecified affecting right dominant side: Secondary | ICD-10-CM | POA: Diagnosis not present

## 2020-01-16 DIAGNOSIS — F0150 Vascular dementia without behavioral disturbance: Secondary | ICD-10-CM

## 2020-01-16 DIAGNOSIS — F39 Unspecified mood [affective] disorder: Secondary | ICD-10-CM | POA: Diagnosis not present

## 2020-01-16 NOTE — Telephone Encounter (Signed)
MD response from labs scanned in media on 01/05/20:  Labs are reviewed. Patient has normal hemoglobin and adequate iron panel. No change of the plan. Patient will be discharged from my clinic. Happy to see patient in the future if she develops new concerns or problems. Thank you

## 2020-02-09 DIAGNOSIS — B351 Tinea unguium: Secondary | ICD-10-CM

## 2020-03-08 LAB — HM DIABETES EYE EXAM

## 2020-03-21 DIAGNOSIS — G8191 Hemiplegia, unspecified affecting right dominant side: Secondary | ICD-10-CM | POA: Diagnosis not present

## 2020-03-21 DIAGNOSIS — K219 Gastro-esophageal reflux disease without esophagitis: Secondary | ICD-10-CM

## 2020-03-21 DIAGNOSIS — F39 Unspecified mood [affective] disorder: Secondary | ICD-10-CM | POA: Diagnosis not present

## 2020-03-21 DIAGNOSIS — E1142 Type 2 diabetes mellitus with diabetic polyneuropathy: Secondary | ICD-10-CM | POA: Diagnosis not present

## 2020-03-21 DIAGNOSIS — F0151 Vascular dementia with behavioral disturbance: Secondary | ICD-10-CM | POA: Diagnosis not present

## 2020-05-10 DIAGNOSIS — F0150 Vascular dementia without behavioral disturbance: Secondary | ICD-10-CM

## 2020-05-10 DIAGNOSIS — K219 Gastro-esophageal reflux disease without esophagitis: Secondary | ICD-10-CM

## 2020-05-10 DIAGNOSIS — I69354 Hemiplegia and hemiparesis following cerebral infarction affecting left non-dominant side: Secondary | ICD-10-CM

## 2020-05-10 DIAGNOSIS — F39 Unspecified mood [affective] disorder: Secondary | ICD-10-CM

## 2020-05-10 DIAGNOSIS — E1140 Type 2 diabetes mellitus with diabetic neuropathy, unspecified: Secondary | ICD-10-CM

## 2020-05-10 DIAGNOSIS — E039 Hypothyroidism, unspecified: Secondary | ICD-10-CM

## 2020-05-24 DIAGNOSIS — B351 Tinea unguium: Secondary | ICD-10-CM

## 2020-07-18 DIAGNOSIS — F0150 Vascular dementia without behavioral disturbance: Secondary | ICD-10-CM

## 2020-07-18 DIAGNOSIS — E1142 Type 2 diabetes mellitus with diabetic polyneuropathy: Secondary | ICD-10-CM | POA: Diagnosis not present

## 2020-07-18 DIAGNOSIS — G8191 Hemiplegia, unspecified affecting right dominant side: Secondary | ICD-10-CM | POA: Diagnosis not present

## 2020-07-18 DIAGNOSIS — F39 Unspecified mood [affective] disorder: Secondary | ICD-10-CM | POA: Diagnosis not present

## 2020-07-18 DIAGNOSIS — K219 Gastro-esophageal reflux disease without esophagitis: Secondary | ICD-10-CM

## 2020-09-11 DIAGNOSIS — E119 Type 2 diabetes mellitus without complications: Secondary | ICD-10-CM | POA: Diagnosis not present

## 2020-09-11 DIAGNOSIS — K219 Gastro-esophageal reflux disease without esophagitis: Secondary | ICD-10-CM

## 2020-09-11 DIAGNOSIS — F39 Unspecified mood [affective] disorder: Secondary | ICD-10-CM

## 2020-09-11 DIAGNOSIS — F0150 Vascular dementia without behavioral disturbance: Secondary | ICD-10-CM

## 2020-09-11 DIAGNOSIS — E039 Hypothyroidism, unspecified: Secondary | ICD-10-CM

## 2020-09-11 DIAGNOSIS — N390 Urinary tract infection, site not specified: Secondary | ICD-10-CM | POA: Diagnosis not present

## 2020-09-11 DIAGNOSIS — I69359 Hemiplegia and hemiparesis following cerebral infarction affecting unspecified side: Secondary | ICD-10-CM | POA: Diagnosis not present

## 2020-11-12 DIAGNOSIS — F39 Unspecified mood [affective] disorder: Secondary | ICD-10-CM

## 2020-11-12 DIAGNOSIS — K219 Gastro-esophageal reflux disease without esophagitis: Secondary | ICD-10-CM

## 2020-11-12 DIAGNOSIS — E1142 Type 2 diabetes mellitus with diabetic polyneuropathy: Secondary | ICD-10-CM

## 2020-11-12 DIAGNOSIS — G8191 Hemiplegia, unspecified affecting right dominant side: Secondary | ICD-10-CM

## 2020-11-12 DIAGNOSIS — F0150 Vascular dementia without behavioral disturbance: Secondary | ICD-10-CM | POA: Diagnosis not present

## 2021-01-03 DIAGNOSIS — I69351 Hemiplegia and hemiparesis following cerebral infarction affecting right dominant side: Secondary | ICD-10-CM

## 2021-01-03 DIAGNOSIS — F39 Unspecified mood [affective] disorder: Secondary | ICD-10-CM

## 2021-01-03 DIAGNOSIS — F0150 Vascular dementia without behavioral disturbance: Secondary | ICD-10-CM | POA: Diagnosis not present

## 2021-01-03 DIAGNOSIS — K219 Gastro-esophageal reflux disease without esophagitis: Secondary | ICD-10-CM

## 2021-01-03 DIAGNOSIS — N183 Chronic kidney disease, stage 3 unspecified: Secondary | ICD-10-CM

## 2021-01-03 DIAGNOSIS — E039 Hypothyroidism, unspecified: Secondary | ICD-10-CM

## 2021-01-03 DIAGNOSIS — E119 Type 2 diabetes mellitus without complications: Secondary | ICD-10-CM

## 2021-01-15 DIAGNOSIS — B351 Tinea unguium: Secondary | ICD-10-CM

## 2021-03-17 DIAGNOSIS — G8191 Hemiplegia, unspecified affecting right dominant side: Secondary | ICD-10-CM | POA: Diagnosis not present

## 2021-03-17 DIAGNOSIS — F0150 Vascular dementia without behavioral disturbance: Secondary | ICD-10-CM | POA: Diagnosis not present

## 2021-03-17 DIAGNOSIS — E1142 Type 2 diabetes mellitus with diabetic polyneuropathy: Secondary | ICD-10-CM | POA: Diagnosis not present

## 2021-03-17 DIAGNOSIS — K219 Gastro-esophageal reflux disease without esophagitis: Secondary | ICD-10-CM

## 2021-03-17 DIAGNOSIS — F39 Unspecified mood [affective] disorder: Secondary | ICD-10-CM | POA: Diagnosis not present

## 2021-04-30 DIAGNOSIS — K219 Gastro-esophageal reflux disease without esophagitis: Secondary | ICD-10-CM | POA: Diagnosis not present

## 2021-04-30 DIAGNOSIS — N183 Chronic kidney disease, stage 3 unspecified: Secondary | ICD-10-CM

## 2021-04-30 DIAGNOSIS — E162 Hypoglycemia, unspecified: Secondary | ICD-10-CM

## 2021-04-30 DIAGNOSIS — E119 Type 2 diabetes mellitus without complications: Secondary | ICD-10-CM | POA: Diagnosis not present

## 2021-04-30 DIAGNOSIS — F39 Unspecified mood [affective] disorder: Secondary | ICD-10-CM | POA: Diagnosis not present

## 2021-04-30 DIAGNOSIS — F0150 Vascular dementia without behavioral disturbance: Secondary | ICD-10-CM | POA: Diagnosis not present

## 2021-04-30 DIAGNOSIS — I69351 Hemiplegia and hemiparesis following cerebral infarction affecting right dominant side: Secondary | ICD-10-CM

## 2021-04-30 DIAGNOSIS — E039 Hypothyroidism, unspecified: Secondary | ICD-10-CM

## 2021-05-20 DIAGNOSIS — F321 Major depressive disorder, single episode, moderate: Secondary | ICD-10-CM

## 2021-05-21 ENCOUNTER — Other Ambulatory Visit: Payer: Self-pay

## 2021-05-21 ENCOUNTER — Inpatient Hospital Stay
Admission: EM | Admit: 2021-05-21 | Discharge: 2021-05-26 | DRG: 682 | Disposition: A | Discharge status: Skilled Nursing Facility | Payer: Medicare Other | Source: Skilled Nursing Facility | Attending: Internal Medicine | Admitting: Internal Medicine

## 2021-05-21 ENCOUNTER — Emergency Department: Payer: Medicare Other

## 2021-05-21 ENCOUNTER — Observation Stay: Payer: Medicare Other

## 2021-05-21 DIAGNOSIS — B964 Proteus (mirabilis) (morganii) as the cause of diseases classified elsewhere: Secondary | ICD-10-CM | POA: Diagnosis present

## 2021-05-21 DIAGNOSIS — Z794 Long term (current) use of insulin: Secondary | ICD-10-CM

## 2021-05-21 DIAGNOSIS — H3530 Unspecified macular degeneration: Secondary | ICD-10-CM | POA: Diagnosis present

## 2021-05-21 DIAGNOSIS — Z20822 Contact with and (suspected) exposure to covid-19: Secondary | ICD-10-CM | POA: Diagnosis present

## 2021-05-21 DIAGNOSIS — Z79890 Hormone replacement therapy (postmenopausal): Secondary | ICD-10-CM

## 2021-05-21 DIAGNOSIS — Z8673 Personal history of transient ischemic attack (TIA), and cerebral infarction without residual deficits: Secondary | ICD-10-CM

## 2021-05-21 DIAGNOSIS — E785 Hyperlipidemia, unspecified: Secondary | ICD-10-CM | POA: Diagnosis present

## 2021-05-21 DIAGNOSIS — N390 Urinary tract infection, site not specified: Secondary | ICD-10-CM | POA: Diagnosis present

## 2021-05-21 DIAGNOSIS — M24541 Contracture, right hand: Secondary | ICD-10-CM | POA: Diagnosis present

## 2021-05-21 DIAGNOSIS — F0280 Dementia in other diseases classified elsewhere without behavioral disturbance: Secondary | ICD-10-CM | POA: Diagnosis present

## 2021-05-21 DIAGNOSIS — N179 Acute kidney failure, unspecified: Principal | ICD-10-CM | POA: Diagnosis present

## 2021-05-21 DIAGNOSIS — R4182 Altered mental status, unspecified: Secondary | ICD-10-CM

## 2021-05-21 DIAGNOSIS — E876 Hypokalemia: Secondary | ICD-10-CM | POA: Diagnosis present

## 2021-05-21 DIAGNOSIS — K219 Gastro-esophageal reflux disease without esophagitis: Secondary | ICD-10-CM | POA: Diagnosis present

## 2021-05-21 DIAGNOSIS — E870 Hyperosmolality and hypernatremia: Secondary | ICD-10-CM | POA: Diagnosis present

## 2021-05-21 DIAGNOSIS — N1832 Chronic kidney disease, stage 3b: Secondary | ICD-10-CM | POA: Diagnosis present

## 2021-05-21 DIAGNOSIS — G309 Alzheimer's disease, unspecified: Secondary | ICD-10-CM | POA: Diagnosis present

## 2021-05-21 DIAGNOSIS — G8191 Hemiplegia, unspecified affecting right dominant side: Secondary | ICD-10-CM | POA: Diagnosis present

## 2021-05-21 DIAGNOSIS — Z87440 Personal history of urinary (tract) infections: Secondary | ICD-10-CM

## 2021-05-21 DIAGNOSIS — E860 Dehydration: Secondary | ICD-10-CM | POA: Diagnosis present

## 2021-05-21 DIAGNOSIS — I2510 Atherosclerotic heart disease of native coronary artery without angina pectoris: Secondary | ICD-10-CM | POA: Diagnosis present

## 2021-05-21 DIAGNOSIS — E039 Hypothyroidism, unspecified: Secondary | ICD-10-CM | POA: Diagnosis present

## 2021-05-21 DIAGNOSIS — E162 Hypoglycemia, unspecified: Secondary | ICD-10-CM | POA: Diagnosis present

## 2021-05-21 DIAGNOSIS — Z87891 Personal history of nicotine dependence: Secondary | ICD-10-CM

## 2021-05-21 DIAGNOSIS — J309 Allergic rhinitis, unspecified: Secondary | ICD-10-CM | POA: Diagnosis present

## 2021-05-21 DIAGNOSIS — Z8249 Family history of ischemic heart disease and other diseases of the circulatory system: Secondary | ICD-10-CM

## 2021-05-21 DIAGNOSIS — G9341 Metabolic encephalopathy: Secondary | ICD-10-CM | POA: Diagnosis not present

## 2021-05-21 DIAGNOSIS — E1122 Type 2 diabetes mellitus with diabetic chronic kidney disease: Secondary | ICD-10-CM | POA: Diagnosis present

## 2021-05-21 DIAGNOSIS — Z79899 Other long term (current) drug therapy: Secondary | ICD-10-CM

## 2021-05-21 DIAGNOSIS — Z993 Dependence on wheelchair: Secondary | ICD-10-CM

## 2021-05-21 DIAGNOSIS — Z888 Allergy status to other drugs, medicaments and biological substances status: Secondary | ICD-10-CM

## 2021-05-21 DIAGNOSIS — I129 Hypertensive chronic kidney disease with stage 1 through stage 4 chronic kidney disease, or unspecified chronic kidney disease: Secondary | ICD-10-CM | POA: Diagnosis present

## 2021-05-21 DIAGNOSIS — E11649 Type 2 diabetes mellitus with hypoglycemia without coma: Secondary | ICD-10-CM | POA: Diagnosis present

## 2021-05-21 DIAGNOSIS — Z833 Family history of diabetes mellitus: Secondary | ICD-10-CM

## 2021-05-21 LAB — PROCALCITONIN: Procalcitonin: <0.1 ng/mL

## 2021-05-21 LAB — COMPREHENSIVE METABOLIC PANEL
ALT: 7 U/L (ref 0–44)
AST: 19 U/L (ref 15–41)
Albumin: 3.4 g/dL — ABNORMAL LOW (ref 3.5–5.0)
Alkaline Phosphatase: 89 U/L (ref 38–126)
Anion gap: 9 (ref 5–15)
BUN: 31 mg/dL — ABNORMAL HIGH (ref 8–23)
CO2: 30 mmol/L (ref 22–32)
Calcium: 9.5 mg/dL (ref 8.9–10.3)
Chloride: 107 mmol/L (ref 98–111)
Creatinine, Ser: 2.36 mg/dL — ABNORMAL HIGH (ref 0.44–1.00)
GFR, Estimated: 20 mL/min — ABNORMAL LOW (ref >60)
Glucose, Bld: 75 mg/dL (ref 70–99)
Potassium: 4.1 mmol/L (ref 3.5–5.1)
Sodium: 146 mmol/L — ABNORMAL HIGH (ref 135–145)
Total Bilirubin: 0.9 mg/dL (ref 0.3–1.2)
Total Protein: 7.8 g/dL (ref 6.5–8.1)

## 2021-05-21 LAB — CBG MONITORING, ED
Glucose-Capillary: 143 mg/dL — ABNORMAL HIGH (ref 70–99)
Glucose-Capillary: 59 mg/dL — ABNORMAL LOW (ref 70–99)
Glucose-Capillary: 161 mg/dL — ABNORMAL HIGH (ref 70–99)
Glucose-Capillary: 178 mg/dL — ABNORMAL HIGH (ref 70–99)
Glucose-Capillary: 140 mg/dL — ABNORMAL HIGH (ref 70–99)
Glucose-Capillary: 95 mg/dL (ref 70–99)

## 2021-05-21 LAB — URINALYSIS, COMPLETE (UACMP) WITH MICROSCOPIC
Bilirubin Urine: NEGATIVE
Glucose, UA: >=500 mg/dL — AB
Ketones, ur: NEGATIVE mg/dL
Nitrite: NEGATIVE
Protein, ur: 100 mg/dL — AB
Specific Gravity, Urine: 1.01 (ref 1.005–1.030)
WBC, UA: >50 WBC/hpf — ABNORMAL HIGH (ref 0–5)
pH: 8 (ref 5.0–8.0)

## 2021-05-21 LAB — HEMOGLOBIN A1C
Hgb A1c MFr Bld: 7.9 % — ABNORMAL HIGH (ref 4.8–5.6)
Mean Plasma Glucose: 180.03 mg/dL

## 2021-05-21 LAB — MAGNESIUM: Magnesium: 2.1 mg/dL (ref 1.7–2.4)

## 2021-05-21 LAB — CBC
HCT: 49.2 % — ABNORMAL HIGH (ref 36.0–46.0)
Hemoglobin: 15.8 g/dL — ABNORMAL HIGH (ref 12.0–15.0)
MCH: 27.1 pg (ref 26.0–34.0)
MCHC: 32.1 g/dL (ref 30.0–36.0)
MCV: 84.5 fL (ref 80.0–100.0)
Platelets: 246 10*3/uL (ref 150–400)
RBC: 5.82 MIL/uL — ABNORMAL HIGH (ref 3.87–5.11)
RDW: 13.8 % (ref 11.5–15.5)
WBC: 10.2 10*3/uL (ref 4.0–10.5)
nRBC: 0 % (ref 0.0–0.2)

## 2021-05-21 LAB — RESP PANEL BY RT-PCR (FLU A&B, COVID) ARPGX2
Influenza A by PCR: NEGATIVE
Influenza B by PCR: NEGATIVE
SARS Coronavirus 2 by RT PCR: NEGATIVE

## 2021-05-21 LAB — PHOSPHORUS: Phosphorus: 4 mg/dL (ref 2.5–4.6)

## 2021-05-21 LAB — TROPONIN I (HIGH SENSITIVITY): Troponin I (High Sensitivity): 27 ng/L — ABNORMAL HIGH (ref <18)

## 2021-05-21 MED ORDER — SODIUM CHLORIDE 0.9 % IV SOLN
1.0000 g | Freq: Once | INTRAVENOUS | Status: AC
  Administered 2021-05-21: 1 g via INTRAVENOUS
  Filled 2021-05-21: qty 10

## 2021-05-21 MED ORDER — INSULIN ASPART 100 UNIT/ML IJ SOLN
0.0000 [IU] | Freq: Three times a day (TID) | INTRAMUSCULAR | Status: DC
  Administered 2021-05-22: 5 [IU] via SUBCUTANEOUS
  Administered 2021-05-22: 2 [IU] via SUBCUTANEOUS
  Administered 2021-05-22: 3 [IU] via SUBCUTANEOUS
  Administered 2021-05-23: 1 [IU] via SUBCUTANEOUS
  Administered 2021-05-23 – 2021-05-24 (×2): 2 [IU] via SUBCUTANEOUS
  Administered 2021-05-24 – 2021-05-25 (×3): 1 [IU] via SUBCUTANEOUS
  Administered 2021-05-25: 2 [IU] via SUBCUTANEOUS
  Administered 2021-05-26: 1 [IU] via SUBCUTANEOUS
  Filled 2021-05-21 (×10): qty 1

## 2021-05-21 MED ORDER — HEPARIN SODIUM (PORCINE) 5000 UNIT/ML IJ SOLN
5000.0000 [IU] | Freq: Three times a day (TID) | INTRAMUSCULAR | Status: DC
  Administered 2021-05-22 – 2021-05-26 (×9): 5000 [IU] via SUBCUTANEOUS
  Filled 2021-05-21 (×10): qty 1

## 2021-05-21 MED ORDER — DEXTROSE 50 % IV SOLN: 1.0000 | INTRAVENOUS | Status: AC | PRN

## 2021-05-21 MED ORDER — LACTATED RINGERS IV SOLN: INTRAVENOUS | Status: DC

## 2021-05-21 MED ORDER — ACETAMINOPHEN 650 MG RE SUPP
650.0000 mg | Freq: Four times a day (QID) | RECTAL | Status: AC | PRN
  Filled 2021-05-21: qty 1

## 2021-05-21 MED ORDER — SODIUM CHLORIDE 0.9 % IV SOLN
500.0000 mg | INTRAVENOUS | Status: AC
  Administered 2021-05-21 – 2021-05-23 (×3): 500 mg via INTRAVENOUS
  Filled 2021-05-21 (×3): qty 500

## 2021-05-21 MED ORDER — PRAVASTATIN SODIUM 20 MG PO TABS
20.0000 mg | ORAL_TABLET | Freq: Every day | ORAL | Status: DC
  Filled 2021-05-21: qty 1

## 2021-05-21 MED ORDER — DULOXETINE HCL 30 MG PO CPEP
30.0000 mg | ORAL_CAPSULE | Freq: Every day | ORAL | Status: DC
  Filled 2021-05-21 (×4): qty 1

## 2021-05-21 MED ORDER — ONDANSETRON HCL 4 MG/2ML IJ SOLN: 4.0000 mg | Freq: Four times a day (QID) | INTRAMUSCULAR | Status: DC | PRN

## 2021-05-21 MED ORDER — DEXTROSE 50 % IV SOLN
25.0000 g | Freq: Once | INTRAVENOUS | Status: AC
  Administered 2021-05-21: 25 g via INTRAVENOUS
  Filled 2021-05-21: qty 50

## 2021-05-21 MED ORDER — PANTOPRAZOLE SODIUM 40 MG PO TBEC
40.0000 mg | DELAYED_RELEASE_TABLET | Freq: Every day | ORAL | Status: DC
  Filled 2021-05-21 (×4): qty 1

## 2021-05-21 MED ORDER — ACETAMINOPHEN 325 MG PO TABS: 650.0000 mg | ORAL_TABLET | Freq: Four times a day (QID) | ORAL | Status: AC | PRN

## 2021-05-21 MED ORDER — ONDANSETRON HCL 4 MG PO TABS: 4.0000 mg | ORAL_TABLET | Freq: Four times a day (QID) | ORAL | Status: DC | PRN

## 2021-05-21 MED ORDER — PANTOPRAZOLE SODIUM 40 MG PO TBEC: 40.0000 mg | DELAYED_RELEASE_TABLET | Freq: Every day | ORAL | Status: DC

## 2021-05-21 MED ORDER — LEVOTHYROXINE SODIUM 88 MCG PO TABS
88.0000 ug | ORAL_TABLET | Freq: Every day | ORAL | Status: DC
  Filled 2021-05-21 (×4): qty 1

## 2021-05-21 MED ORDER — SODIUM CHLORIDE 0.9 % IV SOLN
2.0000 g | INTRAVENOUS | Status: AC
  Administered 2021-05-22 – 2021-05-25 (×4): 2 g via INTRAVENOUS
  Filled 2021-05-21 (×2): qty 20
  Filled 2021-05-21: qty 2
  Filled 2021-05-21: qty 20
  Filled 2021-05-21 (×2): qty 2

## 2021-05-21 MED ORDER — SODIUM CHLORIDE 0.9 % IV BOLUS
1000.0000 mL | Freq: Once | INTRAVENOUS | Status: AC
  Administered 2021-05-21: 1000 mL via INTRAVENOUS

## 2021-05-21 MED ORDER — INSULIN ASPART 100 UNIT/ML IJ SOLN: 0.0000 [IU] | Freq: Every day | INTRAMUSCULAR | Status: DC

## 2021-05-21 MED ORDER — MEMANTINE HCL 10 MG PO TABS
10.0000 mg | ORAL_TABLET | Freq: Two times a day (BID) | ORAL | Status: DC
  Administered 2021-05-22 – 2021-05-25 (×2): 10 mg via ORAL
  Filled 2021-05-21 (×10): qty 1

## 2021-05-21 MED ORDER — LABETALOL HCL 5 MG/ML IV SOLN: 20.0000 mg | INTRAVENOUS | Status: DC | PRN

## 2021-05-21 NOTE — ED Notes (Signed)
Pt awake & resting comfortably in bed, NAD, chest rise & fall. No needs identified at this time. Bed low & locked; call light & personal items within reach.

## 2021-05-21 NOTE — ED Notes (Signed)
PRN orders received.

## 2021-05-21 NOTE — ED Notes (Signed)
Pt resting comfortably in bed, NAD. No needs identified at this time. Bed low & locked; call light & personal items within reach. 

## 2021-05-21 NOTE — ED Notes (Signed)
External purewick placed

## 2021-05-21 NOTE — ED Triage Notes (Signed)
Pt comes into the ED via EMS from [facility], decreased appetite of the past several days,AMS..  Right sided weakness with a previous stroke  Staff reports pt was unconscious and barely breathing [id number] 97%RA 143/103

## 2021-05-21 NOTE — ED Notes (Signed)
Repeat CBG 143, MD aware.

## 2021-05-21 NOTE — H&P (Signed)
History and Physical   [name] [name] NTI:[id number] DOB: [birth year] DOA: 05/21/2021  PCP: Venia Carbon, MD  Outpatient Specialists: Dr. Tasia Catchings, medical oncology Patient coming from: [facility]  I have personally briefly reviewed patient's old medical records in [place].  Chief Concern: Lethargy  HPI: [name] [name] is a 80 y.o. female with medical history significant for Alzheimer dementia, history of remote right MCA with bilateral cerebellar infarcts, cerebral atrophy, small vessel ischemic changes, insulin-dependent diabetes mellitus, hyperlipidemia, GERD, who presents to the emergency department for chief concerns of altered mental status from [facility]  At bedside, patient was lethargic and arousable with sternal rub and loud verbal stimuli. She opened her mouth for me.   She was able to squeeze my hands, she does withdraw to mild pain stimuli, responding, "Will you STOP!". She was not abel to tell me her name, age, current location, or current year. She responds with, "Ouch" with suprapubic abdominal exam.   Social history: Patient is from [facility].  Vaccination history: Unknown  ROS: Unable to complete as patient has altered mental status on chronic Alzheimer dementia  ED Course: Discussed with emergency medicine provider, patient requiring hospitalization for chief concerns of lethargy and altered mental status.  Vitals in the emergency department was remarkable for temperature of 98.6, respiration rate of 12, heart rate of 81, initial blood pressure 158/55, SPO2 of 98% on room air.  Labs in the emergency department was remarkable for sodium 146, potassium 4.1, chloride 107, bicarb 30, serum creatinine of 2.36, BUN 31, nonfasting blood glucose of 73, WBC of 10.2, hemoglobin 15.8, platelets 246.  GFR of 20.  Troponin is 27.  COVID/influenza A/influenza B PCR were negative.  UA showed large leukocytes.  In the emergency department patient was started on sodium  chloride 1 L bolus, dextrose 25 g IV, ceftriaxone 1 g IV per EDP.  Assessment/Plan  Principal Problem:   Acute metabolic encephalopathy Active Problems:   H/O: CVA (cerebrovascular accident)   Altered mental status   Alzheimer's disease ([place])   UTI (urinary tract infection)   Hypoglycemia   AKI (acute kidney injury) ([place])   # Acute metabolic encephalopathy-etiology work-up in progress, suspect likely multifactorial in setting of urinary tract infection present on admission and hypoglycemia - Status post dextrose 25 g IV per EDP - Dextrose 25, 1 amp, as needed for hypoglycemia, 3 days ordered - Ceftriaxone 2 g IV starting on 05/22/2021 initiated, 4 doses ordered - At this time n.p.o. except for sips with meds and ice chips  - Urine culture in process - Aspiration precautions - Fall precautions  # Acute kidney injury-presumed secondary to dehydration given that hemoglobin is elevated  # Hypernatremia - Serum creatinine on presentation is 2.36 - Status post sodium and chloride total of 1 L bolus - Ordered lactated ringer IVF at 150 mL/h, 16 hours ordered - Unclear if patient has CKD at this time - Baseline serum creatinine in 2020 has been 0.93-1.82 - eGFR was greater than 60 in November 2020 - BMP in the a.m.  # Hypoglycemia-multifactorial, presumed secondary to acute infection and continued insulin use - Holding home insulin dosing at this time - Insulin SSI with at bedtime coverage ordered, dextrose 1 amp as needed ordered - Blood glucose monitoring every 2 hours for 12 hours and then every 4 hours for 12 hours - Progressive cardiac, with telemetry, observation  # Alzheimer dementia-outpatient follow-up  # History of cva  Chart reviewed.   DVT  prophylaxis: Heparin 5000 units subcu, every 8 hours Code Status: full code Diet: N.p.o. except for sips with meds and ice chips Family Communication: Updated Ms. [name], daughter over the phone. Disposition Plan: Pending  clinical course Consults called: none at this time  Admission status: Progressive cardiac, observation, telemetry  Past Medical History:  Diagnosis Date   Allergic rhinitis    CAD (coronary artery disease)    Carotid artery occlusion 02/15/2009   CVA (cerebral infarction)    Dementia ([place])    Diabetes mellitus    Gastritis    GERD (gastroesophageal reflux disease)    Heart murmur    Hematemesis    Hemiplegia (HCC)    HLD (hyperlipidemia)    Hypertension    Hypertensive chronic kidney disease    Macular degeneration    Persistent mood (affective) disorder, unspecified (HCC)    Stroke ([place])    Thyroid disease    hypothyroidism   Ulcer of esophagus without bleeding    Past Surgical History:  Procedure Laterality Date   APPENDECTOMY     ESOPHAGOGASTRODUODENOSCOPY Left 10/17/2015   Procedure: ESOPHAGOGASTRODUODENOSCOPY (EGD);  Surgeon: Manya Silvas, MD;  Location: [facility] ENDOSCOPY;  Service: Endoscopy;  Laterality: Left;   ESOPHAGOGASTRODUODENOSCOPY N/A 08/22/2018   Procedure: ESOPHAGOGASTRODUODENOSCOPY (EGD);  Surgeon: Lin Landsman, MD;  Location: [facility] ENDOSCOPY;  Service: Gastroenterology;  Laterality: N/A;   TONSILLECTOMY     Social History:  reports that she quit smoking about 35 years ago. Her smoking use included cigarettes. She has never used smokeless tobacco. She reports that she does not drink alcohol and does not use drugs.  Allergies  Allergen Reactions   Fexofenadine Hcl Rash   Family History  Problem Relation Age of Onset   Diabetes Mother    Hypertension Mother    Diabetes Father    Hypertension Father    Diabetes Sister    Hypertension Sister    Family history: Family history reviewed and not pertinent  Prior to Admission medications   Medication Sig Start Date End Date Taking? Authorizing Provider  acetaminophen (TYLENOL) 325 MG tablet Take 650 mg by mouth every 4 (four) hours as needed.    [provider]  aluminum hydroxide  [facility]) ointment Apply topically every 12 (twelve) hours as needed. Skin protectant to buttocks    [provider]  Cholecalciferol (VITAMIN D3) 50000 units CAPS Take 1 capsule by mouth every 30 (thirty) days.     [provider]  citalopram (CELEXA) 10 MG tablet Take 5 mg by mouth daily.     [provider]  gabapentin (NEURONTIN) 300 MG capsule Take 300 mg by mouth every evening.     [provider]  guaiFENesin (ROBITUSSIN) 100 MG/5ML liquid Take 200 mg by mouth every 4 (four) hours as needed for cough.    [provider]  hydroxypropyl methylcellulose / hypromellose (ISOPTO TEARS / GONIOVISC) 2.5 % ophthalmic solution Place 1 drop into both eyes 3 (three) times daily as needed for dry eyes.    [provider]  insulin regular (NOVOLIN R) 100 units/mL injection Inject 0-16 Units into the skin every 4 (four) hours as needed for high blood sugar. Sliding Scale: 70-130 (0 unit);131-180 (4units); 181-240 (8 units); 241-300 (10 units); 301-350 (12 units); 351-400 (14 units); >401 (16 units) Patient not taking: Reported on 12/29/2019    [provider]  LANTUS SOLOSTAR 100 UNIT/ML Solostar Pen Inject 30 Units into the skin at bedtime. 11/16/19   [provider]  levothyroxine (SYNTHROID, LEVOTHROID) 88 MCG tablet Take 88 mcg by mouth every evening.     [provider]  lovastatin (MEVACOR) 20 MG tablet Take 1 tablet (20 mg total) by mouth every evening. 10/31/18   Vachhani, Vaibhavkumar, MD  memantine (NAMENDA) 10 MG tablet Take 10 mg by mouth 2 (two) times daily.     [provider]  Menthol (RICOLA CHERRY HONEY HERB) 2 MG LOZG Use as directed 1 lozenge in the mouth or throat every 2 (two) hours as needed (FOR COUGH).     [provider]  mirtazapine (REMERON) 15 MG tablet Take 15 mg by mouth at bedtime.     [provider]  omeprazole (PRILOSEC) 20 MG capsule Take 20 mg by mouth 2 (two) times  daily.     [provider]  ondansetron (ZOFRAN) 4 MG tablet Take 4 mg by mouth daily as needed for nausea or vomiting.     [provider]  polyethylene glycol (MIRALAX / GLYCOLAX) packet Take 17 g by mouth daily.     [provider]  senna-docusate (SENOKOT-S) 8.6-50 MG tablet Take 2 tablets by mouth daily.    [provider]   Physical Exam: Vitals:   05/21/21 0900 05/21/21 0930 05/21/21 1103 05/21/21 1130  BP: (!) 158/55 (!) 200/70 (!) 160/96 (!) 206/84  Pulse: 81 79 80 77  Resp: 12 12 18 20  Temp:      TempSrc:      SpO2: 98% 99% 98% 99%   Constitutional: appears older than chronological age, NAD, calm, comfortable Eyes: PERRL, lids and conjunctivae normal ENMT: Mucous membranes are moist. Posterior pharynx clear of any exudate or lesions. Age-appropriate dentition. Hearing appropriate Neck: normal, supple, no masses, no thyromegaly Respiratory: clear to auscultation bilaterally, no wheezing, no crackles. Normal respiratory effort. No accessory muscle use.  Cardiovascular: Regular rate and rhythm, no murmurs / rubs / gallops. No extremity edema. 2+ pedal pulses. No carotid bruits.  Abdomen: obese abdomen, + suprapubic tenderness, no masses palpated, no hepatosplenomegaly. Bowel sounds positive.  Musculoskeletal: no clubbing / cyanosis. No joint deformity upper and lower extremities. Good ROM, no contractures, no atrophy. Normal muscle tone.  Skin: no rashes, lesions, ulcers. No induration Neurologic: Sensation intact. Strength 5/5 in all 4.  Psychiatric: lacks judgment and insight in setting of dementia.  Not alert or oriented to self, age, location.  Cannot assess mood  EKG: independently reviewed, showing sinus rhythm with rate of 73, QTc 480  Chest x-ray on Admission: I personally reviewed and I agree with radiologist reading as below.  CT HEAD WO CONTRAST (5MM)  Result Date: 05/21/2021 CLINICAL DATA:  Mental status changes.  Unresponsive.  EXAM: CT HEAD WITHOUT CONTRAST TECHNIQUE: Contiguous axial images were obtained from the base of the skull through the vertex without intravenous contrast. COMPARISON:  /03/2019 FINDINGS: Brain: Advanced cerebral and cerebellar atrophy. Resultant ventriculomegaly. Again identified is a right MCA remote infarct. Multiple bilateral remote cerebellar infarcts also identified. Moderate to marked low density in the periventricular white matter likely related to small vessel disease. No acute hemorrhage, cortically based infarct, intra-axial, or extra-axial fluid collection. Vascular: Intracranial atherosclerosis. Skull: Hyperostosis frontalis interna. Sinuses/Orbits: Normal imaged portions of the orbits and globes. Clear paranasal sinuses and mastoid air cells. Other: None. IMPRESSION: 1. No acute intracranial abnormality. 2. Cerebral atrophy and small vessel ischemic change. 3. Remote right MCA and bilateral cerebellar infarcts. Electronically Signed   By: Kyle  Talbot M.D.   On: 05/21/2021 10:15      Labs on Admission: I have personally reviewed following labs  CBC: Recent Labs  Lab 05/21/21 0828  WBC 10.2  HGB 15.8*  HCT 49.2*  MCV 84.5  PLT 246   Basic Metabolic Panel: Recent Labs  Lab 05/21/21 0828  NA 146*  K 4.1  CL 107  CO2 30  GLUCOSE 75  BUN 31*  CREATININE 2.36*  CALCIUM 9.5  MG 2.1  PHOS 4.0   GFR: CrCl cannot be calculated (Unknown ideal weight.).  Liver Function Tests: Recent Labs  Lab 05/21/21 0828  AST 19  ALT 7  ALKPHOS 89  BILITOT 0.9  PROT 7.8  ALBUMIN 3.4*   CBG: Recent Labs  Lab 05/21/21 0832 05/21/21 0908 05/21/21 1021 05/21/21 1127  GLUCAP 59* 143* 161* 178*   Urine analysis:    Component Value Date/Time   COLORURINE YELLOW (A) 05/21/2021 0925   APPEARANCEUR HAZY (A) 05/21/2021 0925   APPEARANCEUR Clear 11/16/2013 2342   LABSPEC 1.010 05/21/2021 0925   LABSPEC 1.019 11/16/2013 2342   PHURINE 8.0 05/21/2021 0925   GLUCOSEU >=500 (A)  05/21/2021 0925   GLUCOSEU 150 mg/dL 11/16/2013 2342   HGBUR SMALL (A) 05/21/2021 0925   BILIRUBINUR NEGATIVE 05/21/2021 0925   BILIRUBINUR Negative 11/16/2013 2342   KETONESUR NEGATIVE 05/21/2021 0925   PROTEINUR 100 (A) 05/21/2021 0925   UROBILINOGEN 0.2 09/22/2011 1412   NITRITE NEGATIVE 05/21/2021 0925   LEUKOCYTESUR LARGE (A) 05/21/2021 0925   LEUKOCYTESUR Negative 11/16/2013 2342   Dr.  [facility]  If 7PM-7AM, please contact overnight-coverage provider If 7AM-7PM, please contact day coverage provider [email or web address]  05/21/2021, 12:18 PM   

## 2021-05-21 NOTE — ED Provider Notes (Signed)
[facility] Emergency Department Provider Note  Time seen: 9:05 AM  I have reviewed the triage vital signs and the nursing notes.   HISTORY  Chief Complaint Altered Mental Status   HPI [name] [name] is a 80 y.o. female with a past medical history of CAD, prior CVA, diabetes, dementia, hypertension, hyperlipidemia, presents to the emergency department for decreased mental status.  According to report patient lives at [facility] facility, has baseline dementia but they state over the past 2 days she has been less alert/active than normal, not eating much.  This morning staff reports that the patient was largely unconscious/unresponsive so EMS was called.  EMS brought the patient to the emergency department, reported CBG of 78, 59 on arrival and then repeat of 31 per nurse.  Patient will look at you when you talk to her but does not follow commands or answer questions.  It is unclear if this is her baseline.   Past Medical History:  Diagnosis Date   Allergic rhinitis    CAD (coronary artery disease)    Carotid artery occlusion 02/15/2009   CVA (cerebral infarction)    Dementia (HCC)    Diabetes mellitus    Gastritis    GERD (gastroesophageal reflux disease)    Heart murmur    Hematemesis    Hemiplegia (HCC)    HLD (hyperlipidemia)    Hypertension    Hypertensive chronic kidney disease    Macular degeneration    Persistent mood (affective) disorder, unspecified (HCC)    Stroke (HCC)    Thyroid disease    hypothyroidism   Ulcer of esophagus without bleeding     Patient Active Problem List   Diagnosis Date Noted   Acute metabolic encephalopathy 06/01/2019   UTI (urinary tract infection) 05/26/2019   Pneumonia due to COVID-19 virus 05/26/2019   Sepsis (HCC) 10/27/2018   Iron deficiency anemia due to chronic blood loss 09/06/2018   Pressure injury of skin 09/04/2018   ARF (acute renal failure) (HCC) 09/03/2018   GI bleed 07/15/2018   DKA  (diabetic ketoacidoses) 03/18/2018   Upper GI bleed 10/16/2015   Intractable nausea and vomiting 10/16/2015   Hypertensive urgency 10/16/2015   Acute delirium 05/30/2015   Alzheimer's disease (HCC) 05/30/2015   Altered mental status 05/24/2015   Occlusion and stenosis of carotid artery without mention of cerebral infarction 10/08/2011   CKD (chronic kidney disease) 09/25/2011   H/O: CVA (cerebrovascular accident) 09/22/2011   Diabetes mellitus, type 2 (HCC) 09/22/2011    Past Surgical History:  Procedure Laterality Date   APPENDECTOMY     ESOPHAGOGASTRODUODENOSCOPY Left 10/17/2015   Procedure: ESOPHAGOGASTRODUODENOSCOPY (EGD);  Surgeon: Scot Jun, MD;  Location[facility] ENDOSCOPY;  Service: Endoscopy;  Laterality: Left;   ESOPHAGOGASTRODUODENOSCOPY N/A 08/22/2018   Procedure: ESOPHAGOGASTRODUODENOSCOPY (EGD);  Surgeon: [name], MD;  Location[facility] ENDOSCOPY;  Service: Gastroenterology;  Laterality: N/A;   TONSILLECTOMY      Prior to Admission medications   Medication Sig Start Date End Date Taking? Authorizing Provider  acetaminophen (TYLENOL) 325 MG tablet Take 650 mg by mouth every 4 (four) hours as needed.    [provider]  aluminum hydroxide [facility]) ointment Apply topically every 12 (twelve) hours as needed. Skin protectant to buttocks    [provider]  Cholecalciferol (VITAMIN D3) 50000 units CAPS Take 1 capsule by mouth every 30 (thirty) days.     [provider]  citalopram (CELEXA) 10 MG tablet Take 5 mg by mouth  daily.     [provider]  gabapentin (NEURONTIN) 300 MG capsule Take 300 mg by mouth every evening.     [provider]  guaiFENesin (ROBITUSSIN) 100 MG/5ML liquid Take 200 mg by mouth every 4 (four) hours as needed for cough.    [provider]  hydroxypropyl methylcellulose / hypromellose (ISOPTO TEARS / GONIOVISC) 2.5 % ophthalmic solution Place 1 drop into both eyes 3 (three) times daily  as needed for dry eyes.    [provider]  insulin regular (NOVOLIN R) 100 units/mL injection Inject 0-16 Units into the skin every 4 (four) hours as needed for high blood sugar. Sliding Scale: 70-130 (0 unit);131-180 (4units); 181-240 (8 units); 241-300 (10 units); 301-350 (12 units); 351-400 (14 units); >401 (16 units) Patient not taking: Reported on 12/29/2019    [provider]  LANTUS SOLOSTAR 100 UNIT/ML Solostar Pen Inject 30 Units into the skin at bedtime. 11/16/19   [provider]  levothyroxine (SYNTHROID, LEVOTHROID) 88 MCG tablet Take 88 mcg by mouth every evening.     [provider]  lovastatin (MEVACOR) 20 MG tablet Take 1 tablet (20 mg total) by mouth every evening. 10/31/18   [name], MD  memantine (NAMENDA) 10 MG tablet Take 10 mg by mouth 2 (two) times daily.     [provider]  Menthol (RICOLA CHERRY HONEY HERB) 2 MG LOZG Use as directed 1 lozenge in the mouth or throat every 2 (two) hours as needed (FOR COUGH).     [provider]  mirtazapine (REMERON) 15 MG tablet Take 15 mg by mouth at bedtime.     [provider]  omeprazole (PRILOSEC) 20 MG capsule Take 20 mg by mouth 2 (two) times daily.     [provider]  ondansetron (ZOFRAN) 4 MG tablet Take 4 mg by mouth daily as needed for nausea or vomiting.     [provider]  polyethylene glycol (MIRALAX / GLYCOLAX) packet Take 17 g by mouth daily.     [provider]  senna-docusate (SENOKOT-S) 8.6-50 MG tablet Take 2 tablets by mouth daily.    [provider]    Allergies  Allergen Reactions   Fexofenadine Hcl Rash    Family History  Problem Relation Age of Onset   Diabetes Mother    Hypertension Mother    Diabetes Father    Hypertension Father    Diabetes Sister    Hypertension Sister     Social History Social History   Tobacco Use   Smoking status: Former    Types: Cigarettes    Quit date:  07/20/1985    Years since quitting: 35.8   Smokeless tobacco: Never  Vaping Use   Vaping Use: Never used  Substance Use Topics   Alcohol use: No   Drug use: No    Review of Systems Unable to obtain adequate Edmonia Lynch review of systems secondary to altered mental status and baseline dementia ____________________________________________   PHYSICAL EXAM:  VITAL SIGNS: ED Triage Vitals [05/21/21 0819]  Enc Vitals Group     BP 135/72     Pulse Rate 73     Resp 16     Temp 98.6 F (37 C)     Temp Source Oral     SpO2 100 %     Weight      Height      Head Circumference      [place] Flow      Pain Score  Pain Loc      Pain Edu?      Excl. in GC?    Constitutional: Alert and oriented. Well appearing and in no distress. Eyes: Normal exam ENT      Head: Normocephalic and atraumatic.      Mouth/Throat: Mucous membranes are moist. Cardiovascular: Normal rate, regular rhythm.  Respiratory: Normal respiratory effort without tachypnea nor retractions. Breath sounds are clear  Gastrointestinal: Soft and nontender. No distention.   Musculoskeletal: Nontender with normal range of motion in all extremities. Neurologic:  Normal speech and language. No gross focal neurologic deficits. Skin:  Skin is warm, dry and intact.  Psychiatric: Mood and affect are normal.   ____________________________________________    EKG  EKG viewed and interpreted by myself shows normal sinus rhythm at 73 bpm with a narrow QRS, normal axis, normal intervals, no concerning ST changes.  ____________________________________________    RADIOLOGY  Chest x-ray appears most consistent atelectasis. CT scan head shows no acute abnormality but remote infarct.  ____________________________________________   INITIAL IMPRESSION / ASSESSMENT AND PLAN / ED COURSE  Pertinent labs & imaging results that were available during my care of the patient were reviewed by me and considered in my medical decision making  (see chart for details).   Patient presents to the emergency department for altered mental status and decreased responsiveness this morning.  Patient found to be hypoglycemic in the emergency department we will dose dextrose.  Patient cannot answer questions or follow most commands however is not clear what her baseline is.  Patient is awake and will look at you when you talk to her.  We will check labs, obtain a CT scan of the head, COVID test, urinalysis chest x-ray and continue to closely monitor.  Vital signs are reassuring.  Patient's work-up shows urinary tract infection as well as acute kidney injury.  Given the patient's somnolence and unresponsiveness as well as hypoglycemia this morning patient responded well to dextrose we will admit to the hospitalist service for IV antibiotics, urine culture has been sent.  Daughter agreeable to plan of care  Karizma A Simoni was evaluated in Emergency Department on 05/21/2021 for the symptoms described in the history of present illness. She was evaluated in the context of the global COVID-19 pandemic, which necessitated consideration that the patient might be at risk for infection with the SARS-CoV-2 virus that causes COVID-19. Institutional protocols and algorithms that pertain to the evaluation of patients at risk for COVID-19 are in a state of rapid change based on information released by regulatory bodies including the CDC and federal and state organizations. These policies and algorithms were followed during the patient's care in the ED.  ____________________________________________   FINAL CLINICAL IMPRESSION(S) / ED DIAGNOSES  Altered mental status/decreased responsiveness Urinary tract infection   Minna Antis, MD 05/21/21 1539

## 2021-05-21 NOTE — ED Notes (Addendum)
Pt is alert with light touch but does appear to be slightly lethargic at this, pt not safe to take oral pills at this time.  Pt not able to follow commands for neuro checks at this time

## 2021-05-21 NOTE — ED Notes (Addendum)
UA sent to lab  Daughter states UTI about approx 1 month ago

## 2021-05-21 NOTE — ED Notes (Signed)
Pt sleeping, resting comfortably in bed, NAD, chest rise & fall. Pt arouses to verbal stimulation. No needs verbalized at this time. Bed low & locked; call light & personal items within reach.

## 2021-05-21 NOTE — ED Notes (Signed)
Pt presents to ED via EMS with c/o of being unresponsive and lethargic at facility. Triage reports CBG in the 50's, D50 given. Pt is more alert at this time and is responsive. NAD noted at this time.

## 2021-05-21 NOTE — ED Notes (Signed)
Secure msg sent to Dr. Arville Care re: pt's BP trending up.

## 2021-05-22 DIAGNOSIS — Z20822 Contact with and (suspected) exposure to covid-19: Secondary | ICD-10-CM | POA: Diagnosis present

## 2021-05-22 DIAGNOSIS — N390 Urinary tract infection, site not specified: Secondary | ICD-10-CM | POA: Diagnosis present

## 2021-05-22 DIAGNOSIS — E870 Hyperosmolality and hypernatremia: Secondary | ICD-10-CM | POA: Diagnosis present

## 2021-05-22 DIAGNOSIS — E11649 Type 2 diabetes mellitus with hypoglycemia without coma: Secondary | ICD-10-CM | POA: Diagnosis present

## 2021-05-22 DIAGNOSIS — N179 Acute kidney failure, unspecified: Secondary | ICD-10-CM | POA: Diagnosis present

## 2021-05-22 DIAGNOSIS — E785 Hyperlipidemia, unspecified: Secondary | ICD-10-CM | POA: Diagnosis present

## 2021-05-22 DIAGNOSIS — J309 Allergic rhinitis, unspecified: Secondary | ICD-10-CM | POA: Diagnosis present

## 2021-05-22 DIAGNOSIS — B964 Proteus (mirabilis) (morganii) as the cause of diseases classified elsewhere: Secondary | ICD-10-CM | POA: Diagnosis present

## 2021-05-22 DIAGNOSIS — K219 Gastro-esophageal reflux disease without esophagitis: Secondary | ICD-10-CM | POA: Diagnosis present

## 2021-05-22 DIAGNOSIS — N1832 Chronic kidney disease, stage 3b: Secondary | ICD-10-CM | POA: Diagnosis present

## 2021-05-22 DIAGNOSIS — M24541 Contracture, right hand: Secondary | ICD-10-CM | POA: Diagnosis present

## 2021-05-22 DIAGNOSIS — E162 Hypoglycemia, unspecified: Secondary | ICD-10-CM | POA: Diagnosis not present

## 2021-05-22 DIAGNOSIS — H3530 Unspecified macular degeneration: Secondary | ICD-10-CM | POA: Diagnosis present

## 2021-05-22 DIAGNOSIS — G309 Alzheimer's disease, unspecified: Secondary | ICD-10-CM | POA: Diagnosis present

## 2021-05-22 DIAGNOSIS — G8191 Hemiplegia, unspecified affecting right dominant side: Secondary | ICD-10-CM | POA: Diagnosis present

## 2021-05-22 DIAGNOSIS — E860 Dehydration: Secondary | ICD-10-CM | POA: Diagnosis present

## 2021-05-22 DIAGNOSIS — Z993 Dependence on wheelchair: Secondary | ICD-10-CM | POA: Diagnosis not present

## 2021-05-22 DIAGNOSIS — E039 Hypothyroidism, unspecified: Secondary | ICD-10-CM | POA: Diagnosis present

## 2021-05-22 DIAGNOSIS — G9341 Metabolic encephalopathy: Secondary | ICD-10-CM | POA: Diagnosis present

## 2021-05-22 DIAGNOSIS — E876 Hypokalemia: Secondary | ICD-10-CM | POA: Diagnosis present

## 2021-05-22 DIAGNOSIS — R4182 Altered mental status, unspecified: Secondary | ICD-10-CM | POA: Diagnosis not present

## 2021-05-22 DIAGNOSIS — F0280 Dementia in other diseases classified elsewhere without behavioral disturbance: Secondary | ICD-10-CM | POA: Diagnosis present

## 2021-05-22 DIAGNOSIS — I2510 Atherosclerotic heart disease of native coronary artery without angina pectoris: Secondary | ICD-10-CM | POA: Diagnosis present

## 2021-05-22 DIAGNOSIS — E1122 Type 2 diabetes mellitus with diabetic chronic kidney disease: Secondary | ICD-10-CM | POA: Diagnosis present

## 2021-05-22 DIAGNOSIS — I129 Hypertensive chronic kidney disease with stage 1 through stage 4 chronic kidney disease, or unspecified chronic kidney disease: Secondary | ICD-10-CM | POA: Diagnosis present

## 2021-05-22 LAB — CBG MONITORING, ED
Glucose-Capillary: 69 mg/dL — ABNORMAL LOW (ref 70–99)
Glucose-Capillary: 225 mg/dL — ABNORMAL HIGH (ref 70–99)

## 2021-05-22 LAB — CBC
HCT: 43.5 % (ref 36.0–46.0)
Hemoglobin: 13.6 g/dL (ref 12.0–15.0)
MCH: 27 pg (ref 26.0–34.0)
MCHC: 31.3 g/dL (ref 30.0–36.0)
MCV: 86.5 fL (ref 80.0–100.0)
Platelets: 165 10*3/uL (ref 150–400)
RBC: 5.03 MIL/uL (ref 3.87–5.11)
RDW: 13.6 % (ref 11.5–15.5)
WBC: 6.5 10*3/uL (ref 4.0–10.5)
nRBC: 0 % (ref 0.0–0.2)

## 2021-05-22 LAB — BASIC METABOLIC PANEL
Anion gap: 7 (ref 5–15)
BUN: 17 mg/dL (ref 8–23)
CO2: 26 mmol/L (ref 22–32)
Calcium: 8 mg/dL — ABNORMAL LOW (ref 8.9–10.3)
Chloride: 104 mmol/L (ref 98–111)
Creatinine, Ser: 1.31 mg/dL — ABNORMAL HIGH (ref 0.44–1.00)
GFR, Estimated: 41 mL/min — ABNORMAL LOW (ref >60)
Glucose, Bld: 288 mg/dL — ABNORMAL HIGH (ref 70–99)
Potassium: 3.2 mmol/L — ABNORMAL LOW (ref 3.5–5.1)
Sodium: 137 mmol/L (ref 135–145)

## 2021-05-22 LAB — MRSA NEXT GEN BY PCR, NASAL: MRSA by PCR Next Gen: NOT DETECTED

## 2021-05-22 LAB — PROCALCITONIN: Procalcitonin: <0.1 ng/mL

## 2021-05-22 LAB — GLUCOSE, CAPILLARY
Glucose-Capillary: 281 mg/dL — ABNORMAL HIGH (ref 70–99)
Glucose-Capillary: 180 mg/dL — ABNORMAL HIGH (ref 70–99)
Glucose-Capillary: 53 mg/dL — ABNORMAL LOW (ref 70–99)
Glucose-Capillary: 177 mg/dL — ABNORMAL HIGH (ref 70–99)

## 2021-05-22 MED ORDER — DEXTROSE 50 % IV SOLN
25.0000 g | INTRAVENOUS | Status: AC
  Administered 2021-05-22: 25 g via INTRAVENOUS
  Filled 2021-05-22: qty 50

## 2021-05-22 MED ORDER — DEXTROSE 10 % IV SOLN: INTRAVENOUS | Status: DC

## 2021-05-22 MED ORDER — POTASSIUM CHLORIDE CRYS ER 20 MEQ PO TBCR: 40.0000 meq | EXTENDED_RELEASE_TABLET | Freq: Once | ORAL | Status: DC

## 2021-05-22 NOTE — Progress Notes (Addendum)
PROGRESS NOTE    [name] [name]  [id number] DOB: 1941/07/13 DOA: 05/21/2021 PCP: Venia Carbon, MD   Brief Narrative:  This 80 years old female with PMH significant for Alzheimer's dementia, history of remote right MCA with bilateral cerebellar infarcts, cerebral atrophy, small vessel ischemic changes, insulin-dependent diabetes, hyperlipidemia, GERD presented in the ED with complaints of altered mental status from [facility] skilled nursing facility. Patient is admitted for altered mental status secondary to hypoglycemia and possible UTI started on IV antibiotics and D10W.  Assessment & Plan:   Principal Problem:   Acute metabolic encephalopathy Active Problems:   H/O: CVA (cerebrovascular accident)   Altered mental status   Alzheimer's disease ([place])   UTI (urinary tract infection)   Hypoglycemia   AKI (acute kidney injury) ([place])  Acute metabolic encephalopathy: Could be multifactorial. In the setting of UTI, POA and hypoglycemia. Continue dextrose 10% for now. UA consistent with UTI, continue IV ceftriaxone. Follow urine cultures. Continue IV gentle hydration.   Continue aspiration precautions, fall precautions.  AKI on CKD stage IIIb: > Improving Baseline serum creatinine 0.93-1.82. Serum creatinine on admission 2.36 > 1.36  Suspect prerenal due to decreased p.o. intake. Continue IV gentle hydration, avoid nephrotoxic medications.  Hypoglycemia; multifactorial Presumed secondary to acute infection and continued insulin use. Hold home insulin. Consider regular insulin sliding scale once blood sugar stabilizes.  Alzheimer's dementia: Continue [place] Needs outpatient work-up.  Hypothyroidism: Continue levothyroxine  History of CVA: Continue home medications  DVT prophylaxis: Heparin sq Code Status:Full code. Family Communication: No family at bed side. Disposition Plan:  Status is: Inpatient  Remains inpatient appropriate because: Acute metabolic  encephalopathy could be due to UTI and hyperglycemia.   Anticipated discharge back to skilled nursing facility in 1 to 2 days. Consultants:   None  Procedures:None  Antimicrobials: Ceftriaxone Subjective: Patient was seen and examined at bedside.  Overnight events noted.  Patient seems alert and oriented , it appears like she does not want to talk , responds " nothing" to any questions asked. Objective: Vitals:   05/22/21 0859 05/22/21 0948 05/22/21 1151 05/22/21 1536  BP: (!) 151/101  114/87 (!) 144/81  Pulse: 81  80 81  Resp:      Temp:  98 F (36.7 C) 99.1 F (37.3 C) (!) 97.5 F (36.4 C)  TempSrc:  Oral  Oral  SpO2: 91%  100% 100%    Intake/Output Summary (Last 24 hours) at 05/22/2021 1549 Last data filed at 05/22/2021 1512 Gross per 24 hour  Intake 586.09 ml  Output 650 ml  Net -63.91 ml   There were no vitals filed for this visit.  Examination:  General exam: Appears comfortable, not in any acute distress.  Deconditioned Respiratory system: Clear to auscultation. Respiratory effort normal. Cardiovascular system: S1-S2 heard, regular rate and rhythm, no murmur.   Gastrointestinal system: Abdomen is soft, nontender, nondistended, BS +. Central nervous system: Alert and oriented. No focal neurological deficits. Extremities: Symmetric 5 x 5 power. Skin: No rashes, lesions or ulcers Psychiatry: Judgement and insight appear normal. Mood & affect appropriate.     Data Reviewed: I have personally reviewed following labs and imaging studies  CBC: Recent Labs  Lab 05/21/21 0828 05/22/21 0915  WBC 10.2 6.5  HGB 15.8* 13.6  HCT 49.2* 43.5  MCV 84.5 86.5  PLT 246 [id number]   Basic Metabolic Panel: Recent Labs  Lab 05/21/21 0828 05/22/21 0915  NA 146* 137  K 4.1 3.2*  CL 107 104  CO2  30 26  GLUCOSE 75 288*  BUN 31* 17  CREATININE 2.36* 1.31*  CALCIUM 9.5 8.0*  MG 2.1  --   PHOS 4.0  --    GFR: CrCl cannot be calculated (Unknown ideal weight.). Liver  Function Tests: Recent Labs  Lab 05/21/21 0828  AST 19  ALT 7  ALKPHOS 89  BILITOT 0.9  PROT 7.8  ALBUMIN 3.4*   No results for input(s): LIPASE, AMYLASE in the last 168 hours. No results for input(s): AMMONIA in the last 168 hours. Coagulation Profile: No results for input(s): INR, PROTIME in the last 168 hours. Cardiac Enzymes: No results for input(s): CKTOTAL, CKMB, CKMBINDEX, TROPONINI in the last 168 hours. BNP (last 3 results) No results for input(s): PROBNP in the last 8760 hours. HbA1C: Recent Labs    05/21/21 0828  HGBA1C 7.9*   CBG: Recent Labs  Lab 05/21/21 2215 05/22/21 0405 05/22/21 0749 05/22/21 1150 05/22/21 1537  GLUCAP 95 69* 225* 281* 180*   Lipid Profile: No results for input(s): CHOL, HDL, LDLCALC, TRIG, CHOLHDL, LDLDIRECT in the last 72 hours. Thyroid Function Tests: No results for input(s): TSH, T4TOTAL, FREET4, T3FREE, THYROIDAB in the last 72 hours. Anemia Panel: No results for input(s): VITAMINB12, FOLATE, FERRITIN, TIBC, IRON, RETICCTPCT in the last 72 hours. Sepsis Labs: Recent Labs  Lab 05/21/21 0828 05/22/21 0915  PROCALCITON <0.10 <0.10    Recent Results (from the past 240 hour(s))  Resp Panel by RT-PCR (Flu A&B, Covid) Nasopharyngeal Swab     Status: None   Collection Time: 05/21/21  9:23 AM   Specimen: Nasopharyngeal Swab; Nasopharyngeal(NP) swabs in vial transport medium  Result Value Ref Range Status   SARS Coronavirus 2 by RT PCR NEGATIVE NEGATIVE Final    Comment: (NOTE) SARS-CoV-2 target nucleic acids are NOT DETECTED.  The SARS-CoV-2 RNA is generally detectable in upper respiratory specimens during the acute phase of infection. The lowest concentration of SARS-CoV-2 viral copies this assay can detect is 138 copies/mL. A negative result does not preclude SARS-Cov-2 infection and should not be used as the sole basis for treatment or other patient management decisions. A negative result may occur with  improper  specimen collection/handling, submission of specimen other than nasopharyngeal swab, presence of viral mutation(s) within the areas targeted by this assay, and inadequate number of viral copies(<138 copies/mL). A negative result must be combined with clinical observations, patient history, and epidemiological information. The expected result is Negative.  Fact Sheet for Patients:  BloggerCourse.comhttps://www.fda.gov/media/152166/download  Fact Sheet for Healthcare Providers:  SeriousBroker.ithttps://www.fda.gov/media/152162/download  This test is no t yet approved or cleared by the [place] FDA and  has been authorized for detection and/or diagnosis of SARS-CoV-2 by FDA under an Emergency Use Authorization (EUA). This EUA will remain  in effect (meaning this test can be used) for the duration of the COVID-19 declaration under Section 564(b)(1) of the Act, 21 U.S.C.section 360bbb-3(b)(1), unless the authorization is terminated  or revoked sooner.       Influenza A by PCR NEGATIVE NEGATIVE Final   Influenza B by PCR NEGATIVE NEGATIVE Final    Comment: (NOTE) The Xpert Xpress SARS-CoV-2/FLU/RSV plus assay is intended as an aid in the diagnosis of influenza from Nasopharyngeal swab specimens and should not be used as a sole basis for treatment. Nasal washings and aspirates are unacceptable for Xpert Xpress SARS-CoV-2/FLU/RSV testing.  Fact Sheet for Patients: BloggerCourse.comhttps://www.fda.gov/media/152166/download  Fact Sheet for Healthcare Providers: SeriousBroker.ithttps://www.fda.gov/media/152162/download  This test is not yet approved or cleared by the [place] FDA and has been authorized for detection and/or diagnosis of SARS-CoV-2 by FDA under an Emergency Use Authorization (EUA). This EUA will remain in effect (meaning this test can be used) for the duration of the COVID-19 declaration under Section 564(b)(1) of the Act, 21 U.S.C. section 360bbb-3(b)(1), unless the authorization is terminated or revoked.  Performed at  [facility], [address], [place], [place] [address]   Urine Culture     Status: Abnormal (Preliminary result)   Collection Time: 05/21/21  9:25 AM   Specimen: Urine, Clean Catch  Result Value Ref Range Status   Specimen Description   Final    URINE, CLEAN CATCH Performed at [facility], [address] [place], [place] [address]    Special Requests   Final    NONE Performed at [facility], [address] [place], [place] [address]    Culture >=100,000 COLONIES/mL GRAM NEGATIVE RODS (A)  Final   Report Status PENDING  Incomplete  MRSA Next Gen by PCR, Nasal     Status: None   Collection Time: 05/22/21  1:09 PM   Specimen: Nasal Mucosa; Nasal Swab  Result Value Ref Range Status   MRSA by PCR Next Gen NOT DETECTED NOT DETECTED Final    Comment: (NOTE) The GeneXpert MRSA Assay (FDA approved for NASAL specimens only), is one component of a comprehensive MRSA colonization surveillance program. It is not intended to diagnose MRSA infection nor to guide or monitor treatment for MRSA infections. Test performance is not FDA approved in patients less than 20 years old. Performed at [facility], [address], [place], [place] [address]     Radiology Studies: CT HEAD WO CONTRAST ( )  Result Date: 05/21/2021 CLINICAL DATA:  Mental status changes.  Unresponsive. EXAM: CT HEAD WITHOUT CONTRAST TECHNIQUE: Contiguous axial images were obtained from the base of the skull through the vertex without intravenous contrast. COMPARISON:  /03/2019 FINDINGS: Brain: Advanced cerebral and cerebellar atrophy. Resultant ventriculomegaly. Again identified is a right MCA remote infarct. Multiple bilateral remote cerebellar infarcts also identified. Moderate to marked low density in the periventricular white matter likely related to small vessel disease. No acute hemorrhage, cortically based infarct, intra-axial, or extra-axial fluid collection. Vascular:  Intracranial atherosclerosis. Skull: Hyperostosis frontalis interna. Sinuses/Orbits: Normal imaged portions of the orbits and globes. Clear paranasal sinuses and mastoid air cells. Other: None. IMPRESSION: 1. No acute intracranial abnormality. 2. Cerebral atrophy and small vessel ischemic change. 3. Remote right MCA and bilateral cerebellar infarcts. Electronically Signed   By: Jeronimo Greaves M.D.   On: 05/21/2021 10:15   DG Chest Port 1 View  Result Date: 05/21/2021 CLINICAL DATA:  Altered mental status.  80 year old female. EXAM: PORTABLE CHEST 1 VIEW COMPARISON:  May 28, 2019 FINDINGS: EKG leads project over the chest. Image rotated to the RIGHT. Heart size and mediastinal contours are stable. There is no lobar consolidative process. No visible pneumothorax. Mild increased density at the LEFT lung base but with improved aeration in this area since previous imaging. On limited assessment there is no acute skeletal process. IMPRESSION: Increased aeration since a remote prior at the LEFT lung base, persistent opacity may represent atelectasis or developing infection. Otherwise stable appearance of the chest. Electronically Signed   By: Donzetta Kohut M.D.   On: 05/21/2021 12:33    Scheduled Meds:  DULoxetine  30 mg Oral Daily   heparin  5,000 Units Subcutaneous Q8H   insulin aspart  0-5 Units Subcutaneous QHS  insulin aspart  0-9 Units Subcutaneous TID WC   levothyroxine  88 mcg Oral Q0600   memantine  10 mg Oral BID   pantoprazole  40 mg Oral Daily   potassium chloride  40 mEq Oral Once   pravastatin  20 mg Oral q1800   Continuous Infusions:  azithromycin 250 mL/hr at 05/22/21 1512   cefTRIAXone (ROCEPHIN)  IV Stopped (05/22/21 1337)     LOS: 0 days    Time spent: 35 mins    Saraih Lorton, MD [facility]   If 7PM-7AM, please contact night-coverage

## 2021-05-22 NOTE — Progress Notes (Signed)
Patient blood sugar 53. Patient alert and states her name. IV attempted to flush, occluded. This nurse attempted IV while stat IV team order placed. Requires assist with IV placement. Patient is combative and wont remain still.   1657- dextrose 25g given per order set. Patient states her date of birth, but was unable to tell me the month or year.   0920- blood sugar rechecked after dextrose given- 177 at this time. Patient asymptomatic. Confusion at baseline. No distress. No cold sweats, denies feeling any different that herself.

## 2021-05-22 NOTE — Progress Notes (Signed)
Attempted to crush medications and give to patient in applesauce but she was not cooperative.    Attempted to give a small sip of water and she was also not cooperative with that.    Patient does not have any teeth/dentures and will need a dysphagia diet if she gets put on a diet today.  Currently NPO.

## 2021-05-22 NOTE — ED Notes (Signed)
Lab to bedside to draw morning labs.  Unable to d/t not cooperative.

## 2021-05-22 NOTE — Progress Notes (Signed)
SLP Cancellation Note  Patient Details Name: [name] [name] MRN: [id number] DOB: 05-14-1941   Cancelled treatment:       Reason Eval/Treat Not Completed: Medical issues which prohibited therapy;Patient not medically ready (chart reviewed; consulted NSG).  Per NSG report (w/ attempts at oral care and po meds), pt becomes quickly agitated and uncooperative. Suspect the Dementia is impacted by the Acuity of the situation and the medical illness at this time.  ST services will hold on services today and f/u tomorrow when pt may be calmer, improved overall. Recommend oral care for hygiene and stimulation of swallowing as able. NSG agreed.       Jerilynn Som, MS, CCC-SLP Speech Language Pathologist Rehab Services [phone] [facility] 05/22/2021, 12:51 PM

## 2021-05-23 DIAGNOSIS — G9341 Metabolic encephalopathy: Secondary | ICD-10-CM | POA: Diagnosis not present

## 2021-05-23 LAB — CBC
HCT: 41.9 % (ref 36.0–46.0)
Hemoglobin: 13.4 g/dL (ref 12.0–15.0)
MCH: 26.6 pg (ref 26.0–34.0)
MCHC: 32 g/dL (ref 30.0–36.0)
MCV: 83.3 fL (ref 80.0–100.0)
Platelets: 167 10*3/uL (ref 150–400)
RBC: 5.03 MIL/uL (ref 3.87–5.11)
RDW: 13.2 % (ref 11.5–15.5)
WBC: 8.1 10*3/uL (ref 4.0–10.5)
nRBC: 0 % (ref 0.0–0.2)

## 2021-05-23 LAB — BASIC METABOLIC PANEL
Anion gap: 10 (ref 5–15)
BUN: 11 mg/dL (ref 8–23)
CO2: 25 mmol/L (ref 22–32)
Calcium: 7.7 mg/dL — ABNORMAL LOW (ref 8.9–10.3)
Chloride: 103 mmol/L (ref 98–111)
Creatinine, Ser: 1.39 mg/dL — ABNORMAL HIGH (ref 0.44–1.00)
GFR, Estimated: 38 mL/min — ABNORMAL LOW (ref >60)
Glucose, Bld: 160 mg/dL — ABNORMAL HIGH (ref 70–99)
Potassium: 2.9 mmol/L — ABNORMAL LOW (ref 3.5–5.1)
Sodium: 138 mmol/L (ref 135–145)

## 2021-05-23 LAB — GLUCOSE, CAPILLARY
Glucose-Capillary: 124 mg/dL — ABNORMAL HIGH (ref 70–99)
Glucose-Capillary: 152 mg/dL — ABNORMAL HIGH (ref 70–99)
Glucose-Capillary: 162 mg/dL — ABNORMAL HIGH (ref 70–99)
Glucose-Capillary: 164 mg/dL — ABNORMAL HIGH (ref 70–99)
Glucose-Capillary: 178 mg/dL — ABNORMAL HIGH (ref 70–99)
Glucose-Capillary: 188 mg/dL — ABNORMAL HIGH (ref 70–99)

## 2021-05-23 LAB — MAGNESIUM: Magnesium: 1.3 mg/dL — ABNORMAL LOW (ref 1.7–2.4)

## 2021-05-23 LAB — URINE CULTURE: Culture: >=100000 — AB

## 2021-05-23 LAB — PHOSPHORUS: Phosphorus: 2.2 mg/dL — ABNORMAL LOW (ref 2.5–4.6)

## 2021-05-23 LAB — PROCALCITONIN: Procalcitonin: 0.19 ng/mL

## 2021-05-23 MED ORDER — MAGNESIUM SULFATE 2 GM/50ML IV SOLN
2.0000 g | Freq: Once | INTRAVENOUS | Status: AC
  Administered 2021-05-23: 2 g via INTRAVENOUS
  Filled 2021-05-23: qty 50

## 2021-05-23 MED ORDER — POTASSIUM CHLORIDE 20 MEQ PO PACK
40.0000 meq | PACK | Freq: Once | ORAL | Status: DC
  Filled 2021-05-23: qty 2

## 2021-05-23 MED ORDER — POTASSIUM CHLORIDE 10 MEQ/100ML IV SOLN
10.0000 meq | INTRAVENOUS | Status: AC
  Administered 2021-05-23 (×3): 10 meq via INTRAVENOUS
  Filled 2021-05-23 (×3): qty 100

## 2021-05-23 NOTE — Progress Notes (Addendum)
SLP Cancellation Note  Patient Details Name: [name] [name] MRN: [id number] DOB: [birth year]   Cancelled treatment:       Reason Eval/Treat Not Completed: Medical issues which prohibited therapy;Patient not medically ready (chart reviewed; consulted NSG) Per NSG report (w/ attempts at po trial of water), pt refused to open her mouth and turned away from the cup. D/t quick agitation and  refusal behavior demonstrated w/ attempts at oral trials w/ NSG, recommend continue NPO status d/t risk for choking/aspiration. Suspect pt's Baseline Dementia is impacted by the Acuity of the situation and the medical illness at this time.  ST services will hold on services today and f/u tomorrow when pt may be calmer, more receptive overall. Recommend oral care for hygiene and stimulation of swallowing as able. NSG agreed.      Jerilynn Som, MS, CCC-SLP Speech Language Pathologist Rehab Services [phone] [facility] 05/23/2021, 1:23 PM

## 2021-05-23 NOTE — TOC Initial Note (Signed)
Transition of Care [facility]) - Initial/Assessment Note    Patient Details  Name: [name] [name] MRN: [id number] Date of Birth: [birth year]  Transition of Care [facility]) CM/SW Contact:    Maree Krabbe, LCSW Phone Number: 05/23/2021, 10:09 AM  Clinical Narrative:   CSW spoke with pt's daughter Okey Regal. Okey Regal confirmed pt is a resident at [facility] CSW confirmed with Sue Lush at [facility] that pt was in the rehab [facility]) prior to admission--confirmed pt can return at d/c.                 Expected Discharge Plan: Skilled Nursing Facility Barriers to Discharge: Continued Medical Work up   Patient Goals and CMS Choice Patient states their goals for this hospitalization and ongoing recovery are:: to return to [facility]- per daughter   Choice offered to / list presented to : Adult Children  Expected Discharge Plan and Services Expected Discharge Plan: Skilled Nursing Facility In-house Referral: Clinical Social Work   Post Acute Care Choice: Skilled Nursing Facility Living arrangements for the past 2 months: Assisted Living Facility                                      Prior Living Arrangements/Services Living arrangements for the past 2 months: Assisted Living Facility Lives with:: Facility Resident Patient language and need for interpreter reviewed:: Yes Do you feel safe going back to the place where you live?: Yes      Need for Family Participation in Patient Care: Yes (Comment) Care giver support system in place?: Yes (comment)   Criminal Activity/Legal Involvement Pertinent to Current Situation/Hospitalization: No - Comment as needed  Activities of Daily Living   ADL Screening (condition at time of admission) Patient's cognitive ability adequate to safely complete daily activities?: No Is the patient deaf or have difficulty hearing?: No Does the patient have difficulty seeing, even when wearing glasses/contacts?: No Does the patient have difficulty concentrating,  remembering, or making decisions?: Yes Patient able to express need for assistance with ADLs?: No Does the patient have difficulty dressing or bathing?: Yes Independently performs ADLs?: No Does the patient have difficulty walking or climbing stairs?: Yes Weakness of Legs: Both Weakness of Arms/Hands: Both  Permission Sought/Granted Permission sought to share information with : Family Supports Permission granted to share information with : Yes, Release of Information Signed  Share Information with NAME: Okey Regal  Permission granted to share info w AGENCY: [facility]  Permission granted to share info w Relationship: daughter     Emotional Assessment Appearance:: Appears stated age Attitude/Demeanor/Rapport: Unable to Assess Affect (typically observed): Unable to Assess Orientation: : Oriented to Self Alcohol / Substance Use: Not Applicable Psych Involvement: No (comment)  Admission diagnosis:  Lower urinary tract infectious disease [N39.0] Altered mental status [R41.82] Hypoglycemia [E16.2] Altered mental status, unspecified altered mental status type [R41.82] Acute metabolic encephalopathy [G93.41] Patient Active Problem List   Diagnosis Date Noted   Hypoglycemia 05/21/2021   AKI (acute kidney injury) (HCC) 05/21/2021   Acute metabolic encephalopathy 06/01/2019   UTI (urinary tract infection) 05/26/2019   Pneumonia due to COVID-19 virus 05/26/2019   Sepsis (HCC) 10/27/2018   Iron deficiency anemia due to chronic blood loss 09/06/2018   Pressure injury of skin 09/04/2018   ARF (acute renal failure) (HCC) 09/03/2018   GI bleed 07/15/2018   DKA (diabetic ketoacidoses) 03/18/2018   Upper GI bleed 10/16/2015  Intractable nausea and vomiting 10/16/2015   Hypertensive urgency 10/16/2015   Acute delirium 05/30/2015   Alzheimer's disease (HCC) 05/30/2015   Altered mental status 05/24/2015   Occlusion and stenosis of carotid artery without mention of cerebral infarction 10/08/2011    CKD (chronic kidney disease) 09/25/2011   H/O: CVA (cerebrovascular accident) 09/22/2011   Diabetes mellitus, type 2 (HCC) 09/22/2011   PCP:  Karie Schwalbe, MD Pharmacy:   [facility], [place] - [address] [place] [address] Phone: [phone] Fax: [phone]     Social Determinants of Health (SDOH) Interventions    Readmission Risk Interventions Readmission Risk Prevention Plan 10/31/2018  Transportation Screening Complete  Medication Review (RN Care Manager) Complete  PCP or Specialist appointment within 3-5 days of discharge Complete  HRI or Home Care Consult Complete  SW Recovery Care/Counseling Consult Patient refused  Palliative Care Screening Complete  Skilled Nursing Facility Complete  Some recent data might be hidden

## 2021-05-23 NOTE — Plan of Care (Signed)
  Problem: Clinical Measurements: Goal: Ability to maintain clinical measurements within normal limits will improve Outcome: Progressing   Problem: Clinical Measurements: Goal: Will remain free from infection Outcome: Progressing   Problem: Clinical Measurements: Goal: Diagnostic test results will improve Outcome: Progressing   

## 2021-05-23 NOTE — Progress Notes (Addendum)
Attempted to give pt sip of water and meds, she refused to open her mouth and turned her head away from cup. MD made aware. Will continue to monitor.

## 2021-05-23 NOTE — [place] FL2 (Signed)
[place] MEDICAID FL2 LEVEL OF CARE SCREENING TOOL     IDENTIFICATION  Patient Name: [name] [name] Birthdate: [birth year] Sex: female Admission Date (Current Location): 05/21/2021  [facility] and [place] Number:  Chiropodist and Address:  [facility], [address] [place], [place] [address]      Provider Number: [id number]  Attending Physician Name and Address:  Cipriano Bunker, MD  Relative Name and Phone Number:       Current Level of Care: Hospital Recommended Level of Care: Skilled Nursing Facility Prior Approval Number:    Date Approved/Denied:   PASRR Number:    Discharge Plan: SNF    Current Diagnoses: Patient Active Problem List   Diagnosis Date Noted   Hypoglycemia 05/21/2021   AKI (acute kidney injury) (HCC) 05/21/2021   Acute metabolic encephalopathy 06/01/2019   UTI (urinary tract infection) 05/26/2019   Pneumonia due to COVID-19 virus 05/26/2019   Sepsis (HCC) 10/27/2018   Iron deficiency anemia due to chronic blood loss 09/06/2018   Pressure injury of skin 09/04/2018   ARF (acute renal failure) (HCC) 09/03/2018   GI bleed 07/15/2018   DKA (diabetic ketoacidoses) 03/18/2018   Upper GI bleed 10/16/2015   Intractable nausea and vomiting 10/16/2015   Hypertensive urgency 10/16/2015   Acute delirium 05/30/2015   Alzheimer's disease (HCC) 05/30/2015   Altered mental status 05/24/2015   Occlusion and stenosis of carotid artery without mention of cerebral infarction 10/08/2011   CKD (chronic kidney disease) 09/25/2011   H/O: CVA (cerebrovascular accident) 09/22/2011   Diabetes mellitus, type 2 (HCC) 09/22/2011    Orientation RESPIRATION BLADDER Height & Weight     Self  Normal Incontinent Weight:   Height:     BEHAVIORAL SYMPTOMS/MOOD NEUROLOGICAL BOWEL NUTRITION STATUS      Continent Diet (NPO at this time)  AMBULATORY STATUS COMMUNICATION OF NEEDS Skin   Extensive Assist Verbally Normal                        Personal Care Assistance Level of Assistance  Bathing, Feeding, Dressing Bathing Assistance: Maximum assistance Feeding assistance: Independent Dressing Assistance: Maximum assistance     Functional Limitations Info  Sight, Hearing, Speech Sight Info: Adequate Hearing Info: Adequate Speech Info: Adequate    SPECIAL CARE FACTORS FREQUENCY  PT (By licensed PT), OT (By licensed OT)     PT Frequency: 5x OT Frequency: 5x            Contractures Contractures Info: Not present    Additional Factors Info  Code Status, Allergies Code Status Info: Full Code Allergies Info: Fexonfendaine Hcl           Current Medications (05/23/2021):  This is the current hospital active medication list Current Facility-Administered Medications  Medication Dose Route Frequency Provider Last Rate Last Admin   acetaminophen (TYLENOL) tablet 650 mg  650 mg Oral Q6H PRN Cox, Amy N, DO       Or   acetaminophen (TYLENOL) suppository 650 mg  650 mg Rectal Q6H PRN Cox, Amy N, DO       azithromycin (ZITHROMAX) 500 mg in sodium chloride 0.9 % 250 mL IVPB  500 mg Intravenous Q24H Cox, Amy N, DO 250 mL/hr at 05/22/21 1512 Infusion Verify at 05/22/21 1512   cefTRIAXone (ROCEPHIN) 2 g in sodium chloride 0.9 % 100 mL IVPB  2 g Intravenous Q24H Cox, Amy N, DO   Stopped at 05/22/21 1337   dextrose 50 %  solution 50 mL  1 ampule Intravenous PRN Cox, Amy N, DO       DULoxetine (CYMBALTA) DR capsule 30 mg  30 mg Oral Daily Cox, Amy N, DO       heparin injection 5,000 Units  5,000 Units Subcutaneous Q8H Cox, Amy N, DO   5,000 Units at 05/23/21 0513   insulin aspart (novoLOG) injection 0-5 Units  0-5 Units Subcutaneous QHS Cox, Amy N, DO       insulin aspart (novoLOG) injection 0-9 Units  0-9 Units Subcutaneous TID WC Cox, Amy N, DO   2 Units at 05/22/21 1600   labetalol (NORMODYNE) injection 20 mg  20 mg Intravenous Q3H PRN Mansy, Jan A, MD       levothyroxine (SYNTHROID) tablet 88 mcg  88 mcg Oral Q0600 Cox,  Amy N, DO       memantine (NAMENDA) tablet 10 mg  10 mg Oral BID Cox, Amy N, DO   10 mg at 05/22/21 1210   ondansetron (ZOFRAN) tablet 4 mg  4 mg Oral Q6H PRN Cox, Amy N, DO       Or   ondansetron (ZOFRAN) injection 4 mg  4 mg Intravenous Q6H PRN Cox, Amy N, DO       pantoprazole (PROTONIX) EC tablet 40 mg  40 mg Oral Daily Cox, Amy N, DO       potassium chloride (KLOR-CON) packet 40 mEq  40 mEq Oral Once Ronnald Ramp, RPH       pravastatin (PRAVACHOL) tablet 20 mg  20 mg Oral q1800 Cox, Amy N, DO         Discharge Medications: Please see discharge summary for a list of discharge medications.  Relevant Imaging Results:  Relevant Lab Results:   Additional Information SSN: [id number]  Maree Krabbe, LCSW

## 2021-05-23 NOTE — Progress Notes (Signed)
PROGRESS NOTE    [name] [name]  [id number] DOB: January 18, 1941 DOA: 05/21/2021 PCP: Venia Carbon, MD   Brief Narrative:  This 80 years old female with PMH significant for Alzheimer's dementia, history of remote right MCA with bilateral cerebellar infarcts, cerebral atrophy, small vessel ischemic changes, insulin-dependent diabetes, hyperlipidemia, GERD presented in the ED with complaints of altered mental status from [facility] skilled nursing facility. Patient is admitted for altered mental status secondary to hypoglycemia and possible UTI started on IV antibiotics and D10W.  Blood sugars has improved.  D10W discontinued.  Awaiting urine culture results.  Assessment & Plan:   Principal Problem:   Acute metabolic encephalopathy Active Problems:   H/O: CVA (cerebrovascular accident)   Altered mental status   Alzheimer's disease ([place])   UTI (urinary tract infection)   Hypoglycemia   AKI (acute kidney injury) ([place])  Acute metabolic encephalopathy: Could be multifactorial. In the setting of UTI, POA and hypoglycemia. Continued dextrose 10% . FS improved. UA consistent with UTI, continue IV ceftriaxone. Follow urine cultures. Continue IV gentle hydration.   Continue aspiration precautions, fall precautions.  AKI on CKD stage IIIb: > Improved. Baseline serum creatinine 0.93-1.82. Serum creatinine on admission 2.36 > 1.36  Suspect prerenal due to decreased p.o. intake. Continue IV gentle hydration, avoid nephrotoxic medications.  Hypoglycemia; multifactorial Presumed secondary to acute infection and continued insulin use. Hold home insulin. Consider regular insulin sliding scale once blood sugar stabilizes.  Hypomagnesemia: Replaced, continue to monitor.  Hypokalemia: Replaced, continue to monitor  Alzheimer's dementia: Continue Namenda Needs outpatient work-up.  Hypothyroidism: Continue levothyroxine  History of CVA: Continue home medications  DVT  prophylaxis: Heparin sq Code Status:Full code. Family Communication: No family at bed side. Disposition Plan:  Status is: Inpatient  Remains inpatient appropriate because: Acute metabolic encephalopathy could be due to UTI and hyperglycemia.   Anticipated discharge back to skilled nursing facility in 1 to 2 days.  Consultants:   None  Procedures:None  Antimicrobials: Ceftriaxone.  Subjective: Patient was seen and examined at bedside.  Overnight events noted.   Patient seems alert and oriented x 1.  She is not following commands. She does have intermittent changes in mental status.   Objective: Vitals:   05/22/21 2305 05/23/21 0313 05/23/21 0847 05/23/21 1153  BP: 139/65 (!) 143/60 (!) 127/53 128/81  Pulse: 99 98 85 88  Resp: 18 20 16 16   Temp: 100.2 F (37.9 C) 99.3 F (37.4 C) 98 F (36.7 C) 98.4 F (36.9 C)  TempSrc: Oral Oral    SpO2: 100% 98% 100% 100%    Intake/Output Summary (Last 24 hours) at 05/23/2021 1222 Last data filed at 05/23/2021 1030 Gross per 24 hour  Intake 586.09 ml  Output 350 ml  Net 236.09 ml   There were no vitals filed for this visit.  Examination:  General exam: Appears comfortable, not in any acute distress.  Deconditioned Respiratory system: Clear to auscultation. Respiratory effort normal. Cardiovascular system: S1-S2 heard, regular rate and rhythm, no murmur.   Gastrointestinal system: Abdomen is soft, nontender, nondistended, BS +. Central nervous system: Alert and oriented. No focal neurological deficits. Extremities: No edema, no cyanosis, no clubbing. Skin: No rashes, lesions or ulcers Psychiatry: Judgement and insight appear normal. Mood & affect appropriate.     Data Reviewed: I have personally reviewed following labs and imaging studies  CBC: Recent Labs  Lab 05/21/21 0828 05/22/21 0915 05/23/21 0926  WBC 10.2 6.5 8.1  HGB 15.8* 13.6 13.4  HCT 49.2*  43.5 41.9  MCV 84.5 86.5 83.3  PLT 246 165 167   Basic  Metabolic Panel: Recent Labs  Lab 05/21/21 0828 05/22/21 0915 05/23/21 0926  NA 146* 137 138  K 4.1 3.2* 2.9*  CL 107 104 103  CO2 30 26 25   GLUCOSE 75 288* 160*  BUN 31* 17 11  CREATININE 2.36* 1.31* 1.39*  CALCIUM 9.5 8.0* 7.7*  MG 2.1  --  1.3*  PHOS 4.0  --  2.2*   GFR: CrCl cannot be calculated (Unknown ideal weight.). Liver Function Tests: Recent Labs  Lab 05/21/21 0828  AST 19  ALT 7  ALKPHOS 89  BILITOT 0.9  PROT 7.8  ALBUMIN 3.4*   No results for input(s): LIPASE, AMYLASE in the last 168 hours. No results for input(s): AMMONIA in the last 168 hours. Coagulation Profile: No results for input(s): INR, PROTIME in the last 168 hours. Cardiac Enzymes: No results for input(s): CKTOTAL, CKMB, CKMBINDEX, TROPONINI in the last 168 hours. BNP (last 3 results) No results for input(s): PROBNP in the last 8760 hours. HbA1C: Recent Labs    05/21/21 0828  HGBA1C 7.9*   CBG: Recent Labs  Lab 05/22/21 2118 05/23/21 0049 05/23/21 0407 05/23/21 0848 05/23/21 1154  GLUCAP 177* 152* 162* 178* 188*   Lipid Profile: No results for input(s): CHOL, HDL, LDLCALC, TRIG, CHOLHDL, LDLDIRECT in the last 72 hours. Thyroid Function Tests: No results for input(s): TSH, T4TOTAL, FREET4, T3FREE, THYROIDAB in the last 72 hours. Anemia Panel: No results for input(s): VITAMINB12, FOLATE, FERRITIN, TIBC, IRON, RETICCTPCT in the last 72 hours. Sepsis Labs: Recent Labs  Lab 05/21/21 0828 05/22/21 0915 05/23/21 0926  PROCALCITON <0.10 <0.10 0.19    Recent Results (from the past 240 hour(s))  Resp Panel by RT-PCR (Flu A&B, Covid) Nasopharyngeal Swab     Status: None   Collection Time: 05/21/21  9:23 AM   Specimen: Nasopharyngeal Swab; Nasopharyngeal(NP) swabs in vial transport medium  Result Value Ref Range Status   SARS Coronavirus 2 by RT PCR NEGATIVE NEGATIVE Final    Comment: (NOTE) SARS-CoV-2 target nucleic acids are NOT DETECTED.  The SARS-CoV-2 RNA is generally  detectable in upper respiratory specimens during the acute phase of infection. The lowest concentration of SARS-CoV-2 viral copies this assay can detect is 138 copies/mL. A negative result does not preclude SARS-Cov-2 infection and should not be used as the sole basis for treatment or other patient management decisions. A negative result may occur with  improper specimen collection/handling, submission of specimen other than nasopharyngeal swab, presence of viral mutation(s) within the areas targeted by this assay, and inadequate number of viral copies(<138 copies/mL). A negative result must be combined with clinical observations, patient history, and epidemiological information. The expected result is Negative.  Fact Sheet for Patients:  13/02/22  Fact Sheet for Healthcare Providers:  BloggerCourse.com  This test is no t yet approved or cleared by the [employer] FDA and  has been authorized for detection and/or diagnosis of SARS-CoV-2 by FDA under an Emergency Use Authorization (EUA). This EUA will remain  in effect (meaning this test can be used) for the duration of the COVID-19 declaration under Section 564(b)(1) of the Act, 21 U.S.C.section 360bbb-3(b)(1), unless the authorization is terminated  or revoked sooner.       Influenza A by PCR NEGATIVE NEGATIVE Final   Influenza B by PCR NEGATIVE NEGATIVE Final    Comment: (NOTE) The Xpert Xpress SARS-CoV-2/FLU/RSV plus assay is intended as an aid in the  diagnosis of influenza from Nasopharyngeal swab specimens and should not be used as a sole basis for treatment. Nasal washings and aspirates are unacceptable for Xpert Xpress SARS-CoV-2/FLU/RSV testing.  Fact Sheet for Patients: EntrepreneurPulse.com.au  Fact Sheet for Healthcare Providers: IncredibleEmployment.be  This test is not yet approved or cleared by the [place] FDA  and has been authorized for detection and/or diagnosis of SARS-CoV-2 by FDA under an Emergency Use Authorization (EUA). This EUA will remain in effect (meaning this test can be used) for the duration of the COVID-19 declaration under Section 564(b)(1) of the Act, 21 U.S.C. section 360bbb-3(b)(1), unless the authorization is terminated or revoked.  Performed at [facility], [address] [place], [place] [address]   Urine Culture     Status: Abnormal   Collection Time: 05/21/21  9:25 AM   Specimen: Urine, Clean Catch  Result Value Ref Range Status   Specimen Description   Final    URINE, CLEAN CATCH Performed at [facility], [address] [place], [place] [address]    Special Requests   Final    NONE Performed at [facility], [place], [place] [address]    Culture >=100,000 COLONIES/mL PROTEUS MIRABILIS (A)  Final   Report Status 05/23/2021 FINAL  Final   Organism ID, Bacteria PROTEUS MIRABILIS (A)  Final      Susceptibility   Proteus mirabilis - MIC*    AMPICILLIN <=2 SENSITIVE Sensitive     CEFAZOLIN <=4 SENSITIVE Sensitive     CEFEPIME <=0.12 SENSITIVE Sensitive     CEFTRIAXONE <=0.25 SENSITIVE Sensitive     CIPROFLOXACIN 2 RESISTANT Resistant     GENTAMICIN <=1 SENSITIVE Sensitive     IMIPENEM 1 SENSITIVE Sensitive     NITROFURANTOIN 128 RESISTANT Resistant     TRIMETH/SULFA <=20 SENSITIVE Sensitive     AMPICILLIN/SULBACTAM <=2 SENSITIVE Sensitive     PIP/TAZO <=4 SENSITIVE Sensitive     * >=100,000 COLONIES/mL PROTEUS MIRABILIS  MRSA Next Gen by PCR, Nasal     Status: None   Collection Time: 05/22/21  1:09 PM   Specimen: Nasal Mucosa; Nasal Swab  Result Value Ref Range Status   MRSA by PCR Next Gen NOT DETECTED NOT DETECTED Final    Comment: (NOTE) The GeneXpert MRSA Assay (FDA approved for NASAL specimens only), is one component of a comprehensive MRSA colonization surveillance program. It is not intended to  diagnose MRSA infection nor to guide or monitor treatment for MRSA infections. Test performance is not FDA approved in patients less than 81 years old. Performed at [facility], [address], [place], [place] [address]     Radiology Studies: [facility] Chest [place] 1 View  Result Date: 05/21/2021 CLINICAL DATA:  Altered mental status.  80 year old female. EXAM: PORTABLE CHEST 1 VIEW COMPARISON:  May 28, 2019 FINDINGS: EKG leads project over the chest. Image rotated to the RIGHT. Heart size and mediastinal contours are stable. There is no lobar consolidative process. No visible pneumothorax. Mild increased density at the LEFT lung base but with improved aeration in this area since previous imaging. On limited assessment there is no acute skeletal process. IMPRESSION: Increased aeration since a remote prior at the LEFT lung base, persistent opacity may represent atelectasis or developing infection. Otherwise stable appearance of the chest. Electronically Signed   By: Zetta Bills M.D.   On: 05/21/2021 12:33    Scheduled Meds:  DULoxetine  30 mg Oral Daily   heparin  5,000  Units Subcutaneous Q8H   insulin aspart  0-5 Units Subcutaneous QHS   insulin aspart  0-9 Units Subcutaneous TID WC   levothyroxine  88 mcg Oral Q0600   memantine  10 mg Oral BID   pantoprazole  40 mg Oral Daily   pravastatin  20 mg Oral q1800   Continuous Infusions:  azithromycin 250 mL/hr at 05/22/21 1512   cefTRIAXone (ROCEPHIN)  IV 2 g (05/23/21 1021)   magnesium sulfate bolus IVPB     potassium chloride       LOS: 1 day    Time spent: 25 mins    Shekela Goodridge, MD [facility]   If 7PM-7AM, please contact night-coverage

## 2021-05-24 DIAGNOSIS — G9341 Metabolic encephalopathy: Secondary | ICD-10-CM | POA: Diagnosis not present

## 2021-05-24 LAB — CBC
HCT: 39.9 % (ref 36.0–46.0)
Hemoglobin: 12.9 g/dL (ref 12.0–15.0)
MCH: 26.5 pg (ref 26.0–34.0)
MCHC: 32.3 g/dL (ref 30.0–36.0)
MCV: 82.1 fL (ref 80.0–100.0)
Platelets: 167 10*3/uL (ref 150–400)
RBC: 4.86 MIL/uL (ref 3.87–5.11)
RDW: 13.3 % (ref 11.5–15.5)
WBC: 9.3 10*3/uL (ref 4.0–10.5)
nRBC: 0 % (ref 0.0–0.2)

## 2021-05-24 LAB — GLUCOSE, CAPILLARY
Glucose-Capillary: 134 mg/dL — ABNORMAL HIGH (ref 70–99)
Glucose-Capillary: 180 mg/dL — ABNORMAL HIGH (ref 70–99)
Glucose-Capillary: 189 mg/dL — ABNORMAL HIGH (ref 70–99)
Glucose-Capillary: 196 mg/dL — ABNORMAL HIGH (ref 70–99)
Glucose-Capillary: 73 mg/dL (ref 70–99)
Glucose-Capillary: 94 mg/dL (ref 70–99)

## 2021-05-24 LAB — PHOSPHORUS: Phosphorus: 2.2 mg/dL — ABNORMAL LOW (ref 2.5–4.6)

## 2021-05-24 LAB — MAGNESIUM: Magnesium: 1.9 mg/dL (ref 1.7–2.4)

## 2021-05-24 MED ORDER — MAGNESIUM SULFATE 2 GM/50ML IV SOLN
2.0000 g | Freq: Once | INTRAVENOUS | Status: AC
  Administered 2021-05-24: 2 g via INTRAVENOUS
  Filled 2021-05-24: qty 50

## 2021-05-24 MED ORDER — MAGNESIUM SULFATE 50 % IJ SOLN: 2.0000 g | Freq: Once | INTRAVENOUS | Status: DC

## 2021-05-24 MED ORDER — POTASSIUM CHLORIDE 10 MEQ/100ML IV SOLN
10.0000 meq | INTRAVENOUS | Status: AC
  Administered 2021-05-24 (×2): 10 meq via INTRAVENOUS
  Filled 2021-05-24 (×2): qty 100

## 2021-05-24 NOTE — Progress Notes (Addendum)
SLP Cancellation Note  Patient Details Name: [name] [name] MRN: [id number] DOB: [birth year]   Cancelled treatment:        Chart reviewed, attempted eval again today without success. Pt was awake and attentive. Sitting fully upright in bed. Pt would not part her lips or attempt to take any PO's offered. Distractions limited. Use of slow approach and gentle talking Pt shook her head no and pulled her head back as ST approached with spoon, cup and straw. Several attempts made with rest in between. Pt would not take a cup sitting on bedside table while ST stayed on the other side of the room. No attempts to verbalize but Pt did clearly shake her head no when approached with any PO's. NO swallow assessment achieved today. Pt is currently NPO. Secure chat with MD to consider ordering a Dys 1 diet with honey thick liquids as a trial for staff to offer to Pt if she is willing. STOP feeding with any throat clearing or coughing. It is likely that Pt will not take any PO's. May need to consider discussion with family re comfort care verses alternative nutrition if mental status does not improve for oral PO's.  As ST was leavig the room, Pt stated " I want to leave this room" Told her she needed to try something to eat and she said " what am I going to eat?" Again, ST reattempted offering many items with no success.   Eather Colas 05/24/2021, 9:13 AM

## 2021-05-24 NOTE — Progress Notes (Addendum)
PROGRESS NOTE  [name] [name] [id number] DOB: 01-Oct-1940 DOA: 05/21/2021 PCP: Venia Carbon, MD  HPI/Recap of past 24 hours: Brief Narrative:  Per admitting HPI:  This 80 years old female with PMH significant for Alzheimer's dementia, history of remote right MCA with bilateral cerebellar infarcts, cerebral atrophy, small vessel ischemic changes, insulin-dependent diabetes, hyperlipidemia, GERD presented in the ED with complaints of altered mental status from [facility] skilled nursing facility. Patient is admitted for altered mental status secondary to hypoglycemia and possible UTI started on IV antibiotics and D10W.  Blood sugars has improved.  D10W discontinued. urine culture results.showed proteus mirabilis  Subjective: May 24, 2021 Patient seen and examined at bedside she was sleeping quietly she did not want to be moved initially or touched. Nurse reported patient has been refusing blood draw and medication she gets combative when trying to bend care.     Assessment/Plan: Principal Problem:   Acute metabolic encephalopathy Active Problems:   H/O: CVA (cerebrovascular accident)   Altered mental status   Alzheimer's disease ([place])   UTI (urinary tract infection)   Hypoglycemia   AKI (acute kidney injury) ([place])  Acute metabolic encephalopathy This is multifactorial she does have urinary tract infection prior to admission also she had hypoglycemia this could have been contributing He is currently on dextrose 20% Fasting blood sugar has improved  Urinary tract infection Urine culture is growing Proteus mirabilis  patient is on ceftriaxone and Zithromax Continue IV hydration  AKI on chronic kidney disease stage IIIb improved Baseline creatinine was 0.93-1.82 Suspect AKI is prerenal due to decreased intake Continue gentle IV hydration and avoid nephrotoxic medications  Hypokalemia Replace and continue to monitor  Alzheimer's/dementia Continue Namenda I  have consulted with palliative care  Hypothyroidism: Continue levothyroxine  Code Status: Full  Severity of Illness: The appropriate patient status for this patient is INPATIENT. Inpatient status is judged to be reasonable and necessary in order to provide the required intensity of service to ensure the patient's safety. The patient's presenting symptoms, physical exam findings, and initial radiographic and laboratory data in the context of their chronic comorbidities is felt to place them at high risk for further clinical deterioration. Furthermore, it is not anticipated that the patient will be medically stable for discharge from the hospital within 2 midnights of admission.  Altered mental status, electrolyte imbalance and abnormalities  * I certify that at the point of admission it is my clinical judgment that the patient will require inpatient hospital care spanning beyond 2 midnights from the point of admission due to high intensity of service, high risk for further deterioration and high frequency of surveillance required.*   Family Communication: None at bedside.  Spoke with her daughter Arbie Cookey over the phone and updated her on patient's progress and plan  Disposition Plan: SNF Status is: Inpatient   Dispo: The patient is from: SNF              Anticipated d/c is to: SNF              Anticipated d/c date is:               Patient currently not medically stable for discharge  Consultants: None  Procedures: None  Antimicrobials: Azithromycin Ceftriaxone   DVT prophylaxis: subacute heparin   Objective: Vitals:   05/23/21 1939 05/24/21 0010 05/24/21 0439 05/24/21 0739  BP: (!) 131/56 102/67 (!) 151/87 136/66  Pulse: 93 76 84 88  Resp: 16 17 18  18  Temp: 98.2 F (36.8 C) 98.2 F (36.8 C) 98.1 F (36.7 C) 98.1 F (36.7 C)  TempSrc:   Axillary   SpO2: 100% 98% 96% 94%    Intake/Output Summary (Last 24 hours) at 05/24/2021 0914 Last data filed at 05/23/2021  1800 Gross per 24 hour  Intake 0 ml  Output 350 ml  Net -350 ml   There were no vitals filed for this visit. There is no height or weight on file to calculate BMI.  Exam:  General: 80 y.o. year-old female well developed well nourished in no acute distress.  Alert and oriented x1 Cardiovascular: Regular rate and rhythm with no rubs or gallops.  No thyromegaly or JVD noted.   Respiratory: Clear to auscultation with no wheezes or rales. Good inspiratory effort. Abdomen: Soft nontender nondistended with normal bowel sounds x4 quadrants. Musculoskeletal: No lower extremity edema. 2/4 pulses in all 4 extremities. Skin: No ulcerative lesions noted or rashes, Psychiatry: Mood is appropriate for condition and setting Neurology: Patient initially refused to be discharged but later on she listened to her responded to my question about if she was hungry: She said no    Data Reviewed: CBC: Recent Labs  Lab 05/21/21 0828 05/22/21 0915 05/23/21 0926  WBC 10.2 6.5 8.1  HGB 15.8* 13.6 13.4  HCT 49.2* 43.5 41.9  MCV 84.5 86.5 83.3  PLT 246 165 [id number]   Basic Metabolic Panel: Recent Labs  Lab 05/21/21 0828 05/22/21 0915 05/23/21 0926  NA 146* 137 138  K 4.1 3.2* 2.9*  CL 107 104 103  CO2 30 26 25   GLUCOSE 75 288* 160*  BUN 31* 17 11  CREATININE 2.36* 1.31* 1.39*  CALCIUM 9.5 8.0* 7.7*  MG 2.1  --  1.3*  PHOS 4.0  --  2.2*   GFR: CrCl cannot be calculated (Unknown ideal weight.). Liver Function Tests: Recent Labs  Lab 05/21/21 0828  AST 19  ALT 7  ALKPHOS 89  BILITOT 0.9  PROT 7.8  ALBUMIN 3.4*   No results for input(s): LIPASE, AMYLASE in the last 168 hours. No results for input(s): AMMONIA in the last 168 hours. Coagulation Profile: No results for input(s): INR, PROTIME in the last 168 hours. Cardiac Enzymes: No results for input(s): CKTOTAL, CKMB, CKMBINDEX, TROPONINI in the last 168 hours. BNP (last 3 results) No results for input(s): PROBNP in the last 8760  hours. HbA1C: No results for input(s): HGBA1C in the last 72 hours. CBG: Recent Labs  Lab 05/23/21 1623 05/23/21 1936 05/24/21 0013 05/24/21 0441 05/24/21 0743  GLUCAP 124* 164* 196* 189* 180*   Lipid Profile: No results for input(s): CHOL, HDL, LDLCALC, TRIG, CHOLHDL, LDLDIRECT in the last 72 hours. Thyroid Function Tests: No results for input(s): TSH, T4TOTAL, FREET4, T3FREE, THYROIDAB in the last 72 hours. Anemia Panel: No results for input(s): VITAMINB12, FOLATE, FERRITIN, TIBC, IRON, RETICCTPCT in the last 72 hours. Urine analysis:    Component Value Date/Time   COLORURINE YELLOW (A) 05/21/2021 0925   APPEARANCEUR HAZY (A) 05/21/2021 0925   APPEARANCEUR Clear 11/16/2013 2342   LABSPEC 1.010 05/21/2021 0925   LABSPEC 1.019 11/16/2013 2342   PHURINE 8.0 05/21/2021 0925   GLUCOSEU >=500 (A) 05/21/2021 0925   GLUCOSEU 150 mg/dL 11/16/2013 2342   HGBUR SMALL (A) 05/21/2021 0925   BILIRUBINUR NEGATIVE 05/21/2021 0925   BILIRUBINUR Negative 11/16/2013 [place] 05/21/2021 0925   PROTEINUR 100 (A) 05/21/2021 0925   UROBILINOGEN 0.2 09/22/2011 1412   NITRITE NEGATIVE 05/21/2021 0925  LEUKOCYTESUR LARGE (A) 05/21/2021 0925   LEUKOCYTESUR Negative 11/16/2013 2342   Sepsis Labs: @LABRCNTIP (procalcitonin:4,lacticidven:4)  ) Recent Results (from the past 240 hour(s))  Resp Panel by RT-PCR (Flu A&B, Covid) Nasopharyngeal Swab     Status: None   Collection Time: 05/21/21  9:23 AM   Specimen: Nasopharyngeal Swab; Nasopharyngeal(NP) swabs in vial transport medium  Result Value Ref Range Status   SARS Coronavirus 2 by RT PCR NEGATIVE NEGATIVE Final    Comment: (NOTE) SARS-CoV-2 target nucleic acids are NOT DETECTED.  The SARS-CoV-2 RNA is generally detectable in upper respiratory specimens during the acute phase of infection. The lowest concentration of SARS-CoV-2 viral copies this assay can detect is 138 copies/mL. A negative result does not preclude  SARS-Cov-2 infection and should not be used as the sole basis for treatment or other patient management decisions. A negative result may occur with  improper specimen collection/handling, submission of specimen other than nasopharyngeal swab, presence of viral mutation(s) within the areas targeted by this assay, and inadequate number of viral copies(<138 copies/mL). A negative result must be combined with clinical observations, patient history, and epidemiological information. The expected result is Negative.  Fact Sheet for Patients:  13/02/22  Fact Sheet for Healthcare Providers:  BloggerCourse.com  This test is no t yet approved or cleared by the [employer] FDA and  has been authorized for detection and/or diagnosis of SARS-CoV-2 by FDA under an Emergency Use Authorization (EUA). This EUA will remain  in effect (meaning this test can be used) for the duration of the COVID-19 declaration under Section 564(b)(1) of the Act, 21 U.S.C.section 360bbb-3(b)(1), unless the authorization is terminated  or revoked sooner.       Influenza A by PCR NEGATIVE NEGATIVE Final   Influenza B by PCR NEGATIVE NEGATIVE Final    Comment: (NOTE) The Xpert Xpress SARS-CoV-2/FLU/RSV plus assay is intended as an aid in the diagnosis of influenza from Nasopharyngeal swab specimens and should not be used as a sole basis for treatment. Nasal washings and aspirates are unacceptable for Xpert Xpress SARS-CoV-2/FLU/RSV testing.  Fact Sheet for Patients: [place]  Fact Sheet for Healthcare Providers: BloggerCourse.com  This test is not yet approved or cleared by the [employer] FDA and has been authorized for detection and/or diagnosis of SARS-CoV-2 by FDA under an Emergency Use Authorization (EUA). This EUA will remain in effect (meaning this test can be used) for the duration of  the COVID-19 declaration under Section 564(b)(1) of the Act, 21 U.S.C. section 360bbb-3(b)(1), unless the authorization is terminated or revoked.  Performed at [facility], [address], [place], [place]   Urine Culture     Status: Abnormal   Collection Time: 05/21/21  9:25 AM   Specimen: Urine, Clean Catch  Result Value Ref Range Status   Specimen Description   Final    URINE, CLEAN CATCH Performed at [facility], [address], [place], [place]    Special Requests   Final    NONE Performed at [facility], [address] [place], [place]    Culture >=100,000 COLONIES/mL PROTEUS MIRABILIS (A)  Final   Report Status 05/23/2021 FINAL  Final   Organism ID, Bacteria PROTEUS MIRABILIS (A)  Final      Susceptibility   Proteus mirabilis - MIC*    AMPICILLIN <=2 SENSITIVE Sensitive     CEFAZOLIN <=4 SENSITIVE Sensitive     CEFEPIME <=0.12 SENSITIVE Sensitive     CEFTRIAXONE <=0.25 SENSITIVE  Sensitive     CIPROFLOXACIN 2 RESISTANT Resistant     GENTAMICIN <=1 SENSITIVE Sensitive     IMIPENEM 1 SENSITIVE Sensitive     NITROFURANTOIN 128 RESISTANT Resistant     TRIMETH/SULFA <=20 SENSITIVE Sensitive     AMPICILLIN/SULBACTAM <=2 SENSITIVE Sensitive     PIP/TAZO <=4 SENSITIVE Sensitive     * >=100,000 COLONIES/mL PROTEUS MIRABILIS  MRSA Next Gen by PCR, Nasal     Status: None   Collection Time: 05/22/21  1:09 PM   Specimen: Nasal Mucosa; Nasal Swab  Result Value Ref Range Status   MRSA by PCR Next Gen NOT DETECTED NOT DETECTED Final    Comment: (NOTE) The GeneXpert MRSA Assay (FDA approved for NASAL specimens only), is one component of a comprehensive MRSA colonization surveillance program. It is not intended to diagnose MRSA infection nor to guide or monitor treatment for MRSA infections. Test performance is not FDA approved in patients less than 53 years old. Performed at [facility], [address] [place], [place] [address]       Studies: No results found.  Scheduled Meds:  DULoxetine  30 mg Oral Daily   heparin  5,000 Units Subcutaneous Q8H   insulin aspart  0-5 Units Subcutaneous QHS   insulin aspart  0-9 Units Subcutaneous TID WC   levothyroxine  88 mcg Oral Q0600   memantine  10 mg Oral BID   pantoprazole  40 mg Oral Daily   pravastatin  20 mg Oral q1800    Continuous Infusions:  cefTRIAXone (ROCEPHIN)  IV 2 g (05/23/21 1021)     LOS: 2 days     Cristal Deer, MD [facility]  To reach me or the doctor on call, go to: [email or web address] Password [facility]  05/24/2021, 9:14 AM

## 2021-05-24 NOTE — Progress Notes (Addendum)
Lab attempted to draw labs/pt pulling away and combative. Pt also refusing to meds and any PO intake. MD made aware via secure chat/no new orders at this time. Will continue to monitor.

## 2021-05-25 DIAGNOSIS — G9341 Metabolic encephalopathy: Secondary | ICD-10-CM | POA: Diagnosis not present

## 2021-05-25 LAB — BASIC METABOLIC PANEL
Anion gap: 14 (ref 5–15)
BUN: 12 mg/dL (ref 8–23)
CO2: 18 mmol/L — ABNORMAL LOW (ref 22–32)
Calcium: 8.1 mg/dL — ABNORMAL LOW (ref 8.9–10.3)
Chloride: 107 mmol/L (ref 98–111)
Creatinine, Ser: 1.55 mg/dL — ABNORMAL HIGH (ref 0.44–1.00)
GFR, Estimated: 34 mL/min — ABNORMAL LOW (ref >60)
Glucose, Bld: 120 mg/dL — ABNORMAL HIGH (ref 70–99)
Potassium: 4.8 mmol/L (ref 3.5–5.1)
Sodium: 139 mmol/L (ref 135–145)

## 2021-05-25 LAB — GLUCOSE, CAPILLARY
Glucose-Capillary: 100 mg/dL — ABNORMAL HIGH (ref 70–99)
Glucose-Capillary: 126 mg/dL — ABNORMAL HIGH (ref 70–99)
Glucose-Capillary: 139 mg/dL — ABNORMAL HIGH (ref 70–99)
Glucose-Capillary: 169 mg/dL — ABNORMAL HIGH (ref 70–99)
Glucose-Capillary: 94 mg/dL (ref 70–99)

## 2021-05-25 MED ORDER — LEVOTHYROXINE SODIUM 100 MCG/5ML IV SOLN
75.0000 ug | Freq: Every day | INTRAVENOUS | Status: DC
  Administered 2021-05-25: 75 ug via INTRAVENOUS
  Filled 2021-05-25 (×2): qty 5

## 2021-05-25 MED ORDER — CHLORHEXIDINE GLUCONATE 0.12 % MT SOLN
15.0000 mL | Freq: Two times a day (BID) | OROMUCOSAL | Status: DC
  Administered 2021-05-26: 15 mL via OROMUCOSAL
  Filled 2021-05-25: qty 15

## 2021-05-25 MED ORDER — ORAL CARE MOUTH RINSE
15.0000 mL | Freq: Two times a day (BID) | OROMUCOSAL | Status: DC
  Administered 2021-05-25 – 2021-05-26 (×3): 15 mL via OROMUCOSAL

## 2021-05-25 MED ORDER — MORPHINE SULFATE (PF) 2 MG/ML IV SOLN: 1.0000 mg | Freq: Once | INTRAVENOUS | Status: DC

## 2021-05-25 NOTE — Progress Notes (Signed)
PROGRESS NOTE  [name] [name] [id number] DOB: 1940/08/07 DOA: 05/21/2021 PCP: Venia Carbon, MD  HPI/Recap of past 24 hours: Brief Narrative:  Per admitting HPI:  This 80 years old female with PMH significant for Alzheimer's dementia, history of remote right MCA with bilateral cerebellar infarcts, cerebral atrophy, small vessel ischemic changes, insulin-dependent diabetes, hyperlipidemia, GERD presented in the ED with complaints of altered mental status from [facility] facility. Patient is admitted for altered mental status secondary to hypoglycemia and possible UTI started on IV antibiotics and D10W.  Blood sugars has improved.  D10W discontinued. urine culture results.showed proteus mirabilis  Subjective: May 24, 2021 Patient seen and examined at bedside she was sleeping quietly she did not want to be moved initially or touched. Nurse reported patient has been refusing blood draw and medication she gets combative when trying to bend care.  May 25, 2021 Patient seen and examined at bedside she is more aware and interactive this morning.  She stated that she needed assistance with feeding but nurse reported they went to assist her and she refused earlier on     Assessment/Plan: Principal Problem:   Acute metabolic encephalopathy Active Problems:   H/O: CVA (cerebrovascular accident)   Altered mental status   Alzheimer's disease ([place])   UTI (urinary tract infection)   Hypoglycemia   AKI (acute kidney injury) ([place])  Acute metabolic encephalopathy This is multifactorial she does have urinary tract infection prior to admission also she had hypoglycemia this could have been contributing He is currently on dextrose 20% Fasting blood sugar has improved  Urinary tract infection Urine culture is growing Proteus mirabilis  patient is on ceftriaxone and Zithromax Continue IV hydration  AKI on chronic kidney disease stage IIIb improved Baseline  creatinine was 0.93-1.82 Suspect AKI is prerenal due to decreased intake Continue gentle IV hydration and avoid nephrotoxic medications  Hypokalemia Replace and continue to monitor  Alzheimer's/dementia Continue [place] I have consulted with palliative care  Hypothyroidism: Continue levothyroxine  Code Status: Full  Severity of Illness: The appropriate patient status for this patient is INPATIENT. Inpatient status is judged to be reasonable and necessary in order to provide the required intensity of service to ensure the patient's safety. The patient's presenting symptoms, physical exam findings, and initial radiographic and laboratory data in the context of their chronic comorbidities is felt to place them at high risk for further clinical deterioration. Furthermore, it is not anticipated that the patient will be medically stable for discharge from the hospital within 2 midnights of admission.  Altered mental status, electrolyte imbalance and abnormalities  * I certify that at the point of admission it is my clinical judgment that the patient will require inpatient hospital care spanning beyond 2 midnights from the point of admission due to high intensity of service, high risk for further deterioration and high frequency of surveillance required.*   Family Communication: None at bedside.  Spoke with her daughter Arbie Cookey over the phone and updated her on patient's progress and plan  Disposition Plan: SNF Status is: Inpatient   Dispo: The patient is from: SNF              Anticipated d/c is to: SNF              Anticipated d/c date is:               Patient currently not medically stable for discharge  Consultants: None  Procedures: None  Antimicrobials: Azithromycin Ceftriaxone  DVT prophylaxis: subacute heparin   Objective: Vitals:   05/24/21 2327 05/25/21 0334 05/25/21 0814 05/25/21 1111  BP: 105/83 107/89 (!) 168/87 113/61  Pulse: 92 84 79 85  Resp: 18 20 18 18    Temp: 98 F (36.7 C) 97.8 F (36.6 C) 97.9 F (36.6 C) 98.4 F (36.9 C)  TempSrc: Oral Oral    SpO2: 100% 98% 99% 100%    Intake/Output Summary (Last 24 hours) at 05/25/2021 1353 Last data filed at 05/24/2021 2030 Gross per 24 hour  Intake 368.27 ml  Output 1000 ml  Net -631.73 ml    There were no vitals filed for this visit. There is no height or weight on file to calculate BMI.  Exam:  General: 80 y.o. year-old female well developed well nourished in no acute distress.  Alert and oriented x1 Cardiovascular: Regular rate and rhythm with no rubs or gallops.  No thyromegaly or JVD noted.   Respiratory: Clear to auscultation with no wheezes or rales. Good inspiratory effort. Abdomen: Soft nontender nondistended with normal bowel sounds x4 quadrants. Musculoskeletal: No lower extremity edema. 2/4 pulses in all 4 extremities. Skin: No ulcerative lesions noted or rashes, Psychiatry: Mood is appropriate for condition and setting Neurology: Patient initially refused to be discharged but later on she listened to her responded to my question about if she was hungry: She said no    Data Reviewed: CBC: Recent Labs  Lab 05/21/21 0828 05/22/21 0915 05/23/21 0926 05/24/21 1625  WBC 10.2 6.5 8.1 9.3  HGB 15.8* 13.6 13.4 12.9  HCT 49.2* 43.5 41.9 39.9  MCV 84.5 86.5 83.3 82.1  PLT 246 165 167 [id number]    Basic Metabolic Panel: Recent Labs  Lab 05/21/21 0828 05/22/21 0915 05/23/21 0926 05/24/21 1625 05/25/21 0638  NA 146* 137 138  --  139  K 4.1 3.2* 2.9*  --  4.8  CL 107 104 103  --  107  CO2 30 26 25   --  18*  GLUCOSE 75 288* 160*  --  120*  BUN 31* 17 11  --  12  CREATININE 2.36* 1.31* 1.39*  --  1.55*  CALCIUM 9.5 8.0* 7.7*  --  8.1*  MG 2.1  --  1.3* 1.9  --   PHOS 4.0  --  2.2* 2.2*  --     GFR: CrCl cannot be calculated (Unknown ideal weight.). Liver Function Tests: Recent Labs  Lab 05/21/21 0828  AST 19  ALT 7  ALKPHOS 89  BILITOT 0.9  PROT 7.8   ALBUMIN 3.4*    No results for input(s): LIPASE, AMYLASE in the last 168 hours. No results for input(s): AMMONIA in the last 168 hours. Coagulation Profile: No results for input(s): INR, PROTIME in the last 168 hours. Cardiac Enzymes: No results for input(s): CKTOTAL, CKMB, CKMBINDEX, TROPONINI in the last 168 hours. BNP (last 3 results) No results for input(s): PROBNP in the last 8760 hours. HbA1C: No results for input(s): HGBA1C in the last 72 hours. CBG: Recent Labs  Lab 05/24/21 1159 05/24/21 1633 05/24/21 1941 05/25/21 0815 05/25/21 1113  GLUCAP 134* 73 94 126* 169*    Lipid Profile: No results for input(s): CHOL, HDL, LDLCALC, TRIG, CHOLHDL, LDLDIRECT in the last 72 hours. Thyroid Function Tests: No results for input(s): TSH, T4TOTAL, FREET4, T3FREE, THYROIDAB in the last 72 hours. Anemia Panel: No results for input(s): VITAMINB12, FOLATE, FERRITIN, TIBC, IRON, RETICCTPCT in the last 72 hours. Urine analysis:    Component Value Date/Time  COLORURINE YELLOW (A) 05/21/2021 0925   APPEARANCEUR HAZY (A) 05/21/2021 0925   APPEARANCEUR Clear 11/16/2013 2342   LABSPEC 1.010 05/21/2021 0925   LABSPEC 1.019 11/16/2013 2342   PHURINE 8.0 05/21/2021 0925   GLUCOSEU >=500 (A) 05/21/2021 0925   GLUCOSEU 150 mg/dL 11/16/2013 2342   HGBUR SMALL (A) 05/21/2021 0925   BILIRUBINUR NEGATIVE 05/21/2021 0925   BILIRUBINUR Negative 11/16/2013 2342   KETONESUR NEGATIVE 05/21/2021 0925   PROTEINUR 100 (A) 05/21/2021 0925   UROBILINOGEN 0.2 09/22/2011 1412   NITRITE NEGATIVE 05/21/2021 0925   LEUKOCYTESUR LARGE (A) 05/21/2021 0925   LEUKOCYTESUR Negative 11/16/2013 2342   Sepsis Labs: @LABRCNTIP (procalcitonin:4,lacticidven:4)  ) Recent Results (from the past 240 hour(s))  Resp Panel by RT-PCR (Flu A&B, Covid) Nasopharyngeal Swab     Status: None   Collection Time: 05/21/21  9:23 AM   Specimen: Nasopharyngeal Swab; Nasopharyngeal(NP) swabs in vial transport medium  Result  Value Ref Range Status   SARS Coronavirus 2 by RT PCR NEGATIVE NEGATIVE Final    Comment: (NOTE) SARS-CoV-2 target nucleic acids are NOT DETECTED.  The SARS-CoV-2 RNA is generally detectable in upper respiratory specimens during the acute phase of infection. The lowest concentration of SARS-CoV-2 viral copies this assay can detect is 138 copies/mL. A negative result does not preclude SARS-Cov-2 infection and should not be used as the sole basis for treatment or other patient management decisions. A negative result may occur with  improper specimen collection/handling, submission of specimen other than nasopharyngeal swab, presence of viral mutation(s) within the areas targeted by this assay, and inadequate number of viral copies(<138 copies/mL). A negative result must be combined with clinical observations, patient history, and epidemiological information. The expected result is Negative.  Fact Sheet for Patients:  EntrepreneurPulse.com.au  Fact Sheet for Healthcare Providers:  IncredibleEmployment.be  This test is no t yet approved or cleared by the [place] FDA and  has been authorized for detection and/or diagnosis of SARS-CoV-2 by FDA under an Emergency Use Authorization (EUA). This EUA will remain  in effect (meaning this test can be used) for the duration of the COVID-19 declaration under Section 564(b)(1) of the Act, 21 U.S.C.section 360bbb-3(b)(1), unless the authorization is terminated  or revoked sooner.       Influenza A by PCR NEGATIVE NEGATIVE Final   Influenza B by PCR NEGATIVE NEGATIVE Final    Comment: (NOTE) The Xpert Xpress SARS-CoV-2/FLU/RSV plus assay is intended as an aid in the diagnosis of influenza from Nasopharyngeal swab specimens and should not be used as a sole basis for treatment. Nasal washings and aspirates are unacceptable for Xpert Xpress SARS-CoV-2/FLU/RSV testing.  Fact Sheet for  Patients: EntrepreneurPulse.com.au  Fact Sheet for Healthcare Providers: IncredibleEmployment.be  This test is not yet approved or cleared by the [place] FDA and has been authorized for detection and/or diagnosis of SARS-CoV-2 by FDA under an Emergency Use Authorization (EUA). This EUA will remain in effect (meaning this test can be used) for the duration of the COVID-19 declaration under Section 564(b)(1) of the Act, 21 U.S.C. section 360bbb-3(b)(1), unless the authorization is terminated or revoked.  Performed at [facility], [address], [place], [place] [address]   Urine Culture     Status: Abnormal   Collection Time: 05/21/21  9:25 AM   Specimen: Urine, Clean Catch  Result Value Ref Range Status   Specimen Description   Final    URINE, CLEAN CATCH Performed at [facility], [place], [place]  [address]    Special Requests   Final    NONE Performed at [facility], [address] [place], [place] [address]    Culture >=100,000 COLONIES/mL PROTEUS MIRABILIS (A)  Final   Report Status 05/23/2021 FINAL  Final   Organism ID, Bacteria PROTEUS MIRABILIS (A)  Final      Susceptibility   Proteus mirabilis - MIC*    AMPICILLIN <=2 SENSITIVE Sensitive     CEFAZOLIN <=4 SENSITIVE Sensitive     CEFEPIME <=0.12 SENSITIVE Sensitive     CEFTRIAXONE <=0.25 SENSITIVE Sensitive     CIPROFLOXACIN 2 RESISTANT Resistant     GENTAMICIN <=1 SENSITIVE Sensitive     IMIPENEM 1 SENSITIVE Sensitive     NITROFURANTOIN 128 RESISTANT Resistant     TRIMETH/SULFA <=20 SENSITIVE Sensitive     AMPICILLIN/SULBACTAM <=2 SENSITIVE Sensitive     PIP/TAZO <=4 SENSITIVE Sensitive     * >=100,000 COLONIES/mL PROTEUS MIRABILIS  MRSA Next Gen by PCR, Nasal     Status: None   Collection Time: 05/22/21  1:09 PM   Specimen: Nasal Mucosa; Nasal Swab  Result Value Ref Range Status   MRSA by PCR Next Gen NOT DETECTED NOT  DETECTED Final    Comment: (NOTE) The GeneXpert MRSA Assay (FDA approved for NASAL specimens only), is one component of a comprehensive MRSA colonization surveillance program. It is not intended to diagnose MRSA infection nor to guide or monitor treatment for MRSA infections. Test performance is not FDA approved in patients less than 38 years old. Performed at [facility], [address] [place], [place] [address]       Studies: No results found.  Scheduled Meds:  DULoxetine  30 mg Oral Daily   heparin  5,000 Units Subcutaneous Q8H   insulin aspart  0-5 Units Subcutaneous QHS   insulin aspart  0-9 Units Subcutaneous TID WC   levothyroxine  75 mcg Intravenous Daily   memantine  10 mg Oral BID    morphine injection  1 mg Intravenous Once   pantoprazole  40 mg Oral Daily   pravastatin  20 mg Oral q1800    Continuous Infusions:     LOS: 3 days     Myrtie Neither, MD [facility]  To reach me or the doctor on call, go to: [email or web address] Password [email or web address]  05/25/2021, 1:53 PM

## 2021-05-25 NOTE — Evaluation (Signed)
Clinical/Bedside Swallow Evaluation Patient Details  Name: [name] [name] MRN: [id number] Date of Birth: 21-Aug-1940  Today's Date: 05/25/2021 Time: SLP Start Time (ACUTE ONLY): 0915 SLP Stop Time (ACUTE ONLY): 0935 SLP Time Calculation (min) (ACUTE ONLY): 20 min  Past Medical History:  Past Medical History:  Diagnosis Date   Allergic rhinitis    CAD (coronary artery disease)    Carotid artery occlusion 02/15/2009   CVA (cerebral infarction)    Dementia (HCC)    Diabetes mellitus    Gastritis    GERD (gastroesophageal reflux disease)    Heart murmur    Hematemesis    Hemiplegia (HCC)    HLD (hyperlipidemia)    Hypertension    Hypertensive chronic kidney disease    Macular degeneration    Persistent mood (affective) disorder, unspecified (HCC)    Stroke (HCC)    Thyroid disease    hypothyroidism   Ulcer of esophagus without bleeding    Past Surgical History:  Past Surgical History:  Procedure Laterality Date   APPENDECTOMY     ESOPHAGOGASTRODUODENOSCOPY Left 10/17/2015   Procedure: ESOPHAGOGASTRODUODENOSCOPY (EGD);  Surgeon: Scot Jun, MD;  [facility] END[facility];  Service: Endoscopy;  Laterality: Left;   ESOPHAGOGASTRODUODENOSCOPY N/A 08/22/2018   Procedure: ESOPHAGOGASTRODUODENOSCOPY (EGD);  Surgeon: [name], MD;  Location: [facility] ENDOSCOPY;  Service: Gastroenterology;  Laterality: N/A;   TONSILLECTOMY     HPI:  Per Physician's H&P "[name] [name] is a 80 y.o. female with medical history significant for Alzheimer dementia, history of remote right MCA with bilateral cerebellar infarcts, cerebral atrophy, small vessel ischemic changes, insulin-dependent diabetes mellitus, hyperlipidemia, GERD, who presents to the emergency department for chief concerns of altered mental status from [facility].     At bedside, patient was lethargic and arousable with sternal rub and loud verbal stimuli. She opened her mouth for me.      She was able to squeeze my hands, she  does withdraw to mild pain stimuli, responding, "Will you STOP!". She was not abel to tell me her name, age, current location, or current year. She responds with, "Ouch" with suprapubic abdominal exam."    Assessment / Plan / Recommendation  Clinical Impression  Pt seen for clinical swallowing evaluation. Pt alert. Confusion evident. RN present.   Pt unable to participate in oral motor examination given decreased ability to follow commands. Edentulism noted. No obvious oral motor deficits appreciated during clinical assessment.   Pt presents with s/sx oral dysphagia c/b limited mouth opening to accept boluses from teaspoon. Pt accepting 1/4-1/2 teaspoons of applesauce and ice cream at a time; approx 2 oz total. Pt consumed ~2 oz of thin liquids (water; soda) via straw sip. Inconsistent delayed swallow initiation to palpation observed. No overt s/sx pharyngeal dysphagia. No change to vocal quality across trials. Suspect pt prefers sweeter food and beverages as pt more readily accepted soda and ice cream. Solid trials deferred given AMS, dental status, and hx of oral dysphagia noted on SLP evaluation 04/29/19.  Per chart review, findings on today's assessment were consistent with findings on 05/08/21. Recommend initiation of a pureed diet with thin liquids. Meds crushed with pudding/ice cream for ease of swallowing. Safe swallowing strategies as outlined below.   Concern for pt's ability to meet nutritional needs orally. May wish to consider RD consult after PO diet is initiated.  Pt is at increased risk for aspiration/aspiration PNA given AMS, multiple medical comorbidities, dental status, and dependence for feeding.   SLP to f/u x1  for diet tolerance.  SLP Visit Diagnosis: Dysphagia, oropharyngeal phase (R13.12)    Aspiration Risk  Mild aspiration risk    Diet Recommendation Dysphagia 1 (Puree);Thin liquid   Liquid Administration via: Straw Medication Administration: Crushed with  puree Supervision: Staff to assist with self feeding;Full supervision/cueing for compensatory strategies Compensations: Minimize environmental distractions;Slow rate;Small sips/bites Postural Changes: Seated upright at 90 degrees    Other  Recommendations Oral Care Recommendations: Oral care BID    Recommendations for follow up therapy are one component of a multi-disciplinary discharge planning process, led by the attending physician.  Recommendations may be updated based on patient status, additional functional criteria and insurance authorization.  Follow up Recommendations 24 hour supervision/assistance;Skilled Nursing facility      Frequency and Duration min 1 x/week  1 week       Prognosis Prognosis for Safe Diet Advancement: Guarded Barriers to Reach Goals: Cognitive deficits;Behavior (likely at baseline given results of previous clinical swallowing evaluation)      Swallow Study   General Date of Onset: 05/21/21 HPI: Per Physician's H&P "[name] [name] is a 80 y.o. female with medical history significant for Alzheimer dementia, history of remote right MCA with bilateral cerebellar infarcts, cerebral atrophy, small vessel ischemic changes, insulin-dependent diabetes mellitus, hyperlipidemia, GERD, who presents to the emergency department for chief concerns of altered mental status from [facility].     At bedside, patient was lethargic and arousable with sternal rub and loud verbal stimuli. She opened her mouth for me.      She was able to squeeze my hands, she does withdraw to mild pain stimuli, responding, "Will you STOP!". She was not abel to tell me her name, age, current location, or current year. She responds with, "Ouch" with suprapubic abdominal exam."  CXR on admission "Increased aeration since a remote prior at the LEFT lung base, persistent opacity may represent atelectasis or developing infection. Otherwise stable appearance of the chest."  Type of Study: Bedside  Swallow Evaluation Previous Swallow Assessment: 05/30/19 - recommended pureed, thin; see note for details Diet Prior to this Study: Dysphagia 1 (puree);Thin liquids Temperature Spikes Noted: No Respiratory Status: Room air History of Recent Intubation: No Behavior/Cognition: Alert;Confused;Doesn't follow directions Oral Cavity Assessment: Within Functional Limits Oral Care Completed by SLP:  (pt declined) Oral Cavity - Dentition: Edentulous Vision:  (pt did not attempt self-feeding) Self-Feeding Abilities: Total assist Patient Positioning: Upright in bed;Partially reclined Baseline Vocal Quality: Normal (high pitch) Volitional Cough: Cognitively unable to elicit Volitional Swallow: Unable to elicit    Oral/Motor/Sensory Function Overall Oral Motor/Sensory Function:  (unable to assess; no obvious deficits)   Thin Liquid Thin Liquid: Impaired Presentation: Straw Pharyngeal  Phase Impairments: Suspected delayed Swallow (inconsistent) Other Comments: ~2 oz; single sips via straw    Puree Puree: Impaired Presentation: Spoon Oral Phase Impairments:  (limited mouth opening to accept boluses) Oral Phase Functional Implications:  (minimal bolus retrieval) Other Comments: ~2 oz; accepted 1/4-1/2 teaspoons at a time    Clyde Canterbury, M.S., CCC-SLP Speech-Language Pathologist [place] [facility] [phone] (ASCOM)   [name] [name] 05/25/2021,10:57 AM

## 2021-05-26 DIAGNOSIS — R4182 Altered mental status, unspecified: Secondary | ICD-10-CM | POA: Diagnosis not present

## 2021-05-26 DIAGNOSIS — Z789 Other specified health status: Secondary | ICD-10-CM

## 2021-05-26 DIAGNOSIS — G9341 Metabolic encephalopathy: Secondary | ICD-10-CM | POA: Diagnosis not present

## 2021-05-26 DIAGNOSIS — Z8673 Personal history of transient ischemic attack (TIA), and cerebral infarction without residual deficits: Secondary | ICD-10-CM

## 2021-05-26 DIAGNOSIS — G309 Alzheimer's disease, unspecified: Secondary | ICD-10-CM | POA: Diagnosis not present

## 2021-05-26 DIAGNOSIS — Z515 Encounter for palliative care: Secondary | ICD-10-CM

## 2021-05-26 DIAGNOSIS — E162 Hypoglycemia, unspecified: Secondary | ICD-10-CM

## 2021-05-26 DIAGNOSIS — N390 Urinary tract infection, site not specified: Secondary | ICD-10-CM | POA: Diagnosis not present

## 2021-05-26 DIAGNOSIS — F0280 Dementia in other diseases classified elsewhere without behavioral disturbance: Secondary | ICD-10-CM

## 2021-05-26 LAB — BASIC METABOLIC PANEL
Anion gap: 12 (ref 5–15)
BUN: 10 mg/dL (ref 8–23)
CO2: 24 mmol/L (ref 22–32)
Calcium: 8.6 mg/dL — ABNORMAL LOW (ref 8.9–10.3)
Chloride: 103 mmol/L (ref 98–111)
Creatinine, Ser: 1.34 mg/dL — ABNORMAL HIGH (ref 0.44–1.00)
GFR, Estimated: 40 mL/min — ABNORMAL LOW (ref >60)
Glucose, Bld: 95 mg/dL (ref 70–99)
Potassium: 3.1 mmol/L — ABNORMAL LOW (ref 3.5–5.1)
Sodium: 139 mmol/L (ref 135–145)

## 2021-05-26 LAB — GLUCOSE, CAPILLARY
Glucose-Capillary: 115 mg/dL — ABNORMAL HIGH (ref 70–99)
Glucose-Capillary: 135 mg/dL — ABNORMAL HIGH (ref 70–99)
Glucose-Capillary: 143 mg/dL — ABNORMAL HIGH (ref 70–99)

## 2021-05-26 LAB — RESP PANEL BY RT-PCR (FLU A&B, COVID) ARPGX2
Influenza A by PCR: NEGATIVE
Influenza B by PCR: NEGATIVE
SARS Coronavirus 2 by RT PCR: NEGATIVE

## 2021-05-26 LAB — PHOSPHORUS: Phosphorus: 1.5 mg/dL — ABNORMAL LOW (ref 2.5–4.6)

## 2021-05-26 MED ORDER — POTASSIUM CHLORIDE CRYS ER 20 MEQ PO TBCR: 40.0000 meq | EXTENDED_RELEASE_TABLET | Freq: Once | ORAL | Status: DC

## 2021-05-26 MED ORDER — K PHOS MONO-SOD PHOS DI & MONO 155-852-130 MG PO TABS
500.0000 mg | ORAL_TABLET | Freq: Once | ORAL | Status: DC
  Filled 2021-05-26: qty 2

## 2021-05-26 MED ORDER — K PHOS MONO-SOD PHOS DI & MONO 155-852-130 MG PO TABS: 500.0000 mg | ORAL_TABLET | Freq: Every day | ORAL | 0 refills | Status: AC

## 2021-05-26 MED ORDER — POTASSIUM CHLORIDE CRYS ER 20 MEQ PO TBCR: 40.0000 meq | EXTENDED_RELEASE_TABLET | Freq: Every day | ORAL | 0 refills | Status: AC

## 2021-05-26 NOTE — Progress Notes (Addendum)
After reviewing the patient's medical records and epic notes, I assessed the patient at bedside.  She was resting in no apparent distress.  She was attempting to use the phone to call her daughter.  She knew her name and date of birth but could not recall any other facts.  I appreciate she has a history of Alzheimer's dementia.  So, I attempted to speak with her daughter Okey Regal.  No answer.  I left a HIPAA appropriate voicemail.  Awaiting callback to discuss palliative care needs and goals of care.  Samara Deist L. Manon Hilding, FNP-BC Palliative Medicine Team Team Phone # [phone]  NO CHARGE

## 2021-05-26 NOTE — Consult Note (Signed)
Consultation Note Date: 05/26/2021   Patient Name: [name] [name]  DOB: 04-19-1941  MRN: [id number]  Age / Sex: 80 y.o., female  PCP: Karie Schwalbe, MD Referring Physician: Burnadette Pop, MD  Reason for Consultation: Establishing goals of care  HPI/Patient Profile: 80 y.o. female  with past medical history of Alzheimer's dementia, history of remote right MCA with bilateral cerebellar infarcts, cerebral atrophy, small vessel ischemic changes, insulin-dependent diabetic, HLD, CAD, macular degeneration, GERD and thyroid disease admitted on 05/21/2021 with altered mental status.  Patient is a resident of [facility] where she was being treated for a urinary tract infection that started approximately 1 month ago.  Patient is scheduled for discharge today.  Palliative medicine team was consulted to discuss goals of care.  Clinical Assessment and Goals of Care: I have reviewed medical records including EPIC notes, labs and imaging,  assessed the patient and then spoke with the patient's daughter Okey Regal over the phone to discuss diagnosis prognosis, GOC, EOL wishes, disposition and options.  I introduced Palliative Medicine as specialized medical care for people living with serious illness. It focuses on providing relief from the symptoms and stress of a serious illness. The goal is to improve quality of life for both the patient and the family.  As far as functional and nutritional status prior to admission patient is wheelchair-bound.  Daughter reports she eats like a bird and does not eat large portions of any meals.  Daughter reports patient has been struggling with recurrent urinary tract infections.  Daughter is asking what she can do to prevent these in the future.   I reviewed that dementia is a chronic and progressive disease.  Her dementia layered on top of her cerebellar infarcts as well as ischemic  changes could be contributing to the decline the patient has experienced over the last month or so.  Daughter shares that the patient does well when the daughter is present to feed her.  Daughter believes the patient can eat and swallow well and does not need a pured diet.  I shared the SLP evaluation notes and recommendations.  Daughter was not surprised that the patient did not cooperate with swallow evaluation.  We discussed patient's current illness and what it means in the larger context of patient's on-going co-morbidities.  Daughter is ready to continue with any and all available and appropriate medical treatments.  She is concerned that her mother is not getting enough hydration and would like to know what she can do [facility] to increase her mother's hydration.  I shared that should her mom not want to eat or drink anything that allowing this natural process is an option.  Also reviewed that should she want to move forward with medically aggressive options then a feeding tube may be considered.  I outlined that a feeding tube would not be recommended in someone who has dementia and of advanced age.  Daughter does not believe that patient is at this point to make this type of decision yet.  Advance  directives, concepts specific to code status, artificial feeding and hydration, and rehospitalization were considered and discussed.  Daughter is prepared to keep the patient as a full code and to treat the treatable.  Discussed with patient/family the importance of continued conversation with family and the medical providers regarding overall plan of care and treatment options, ensuring decisions are within the context of the patient's values and GOCs.    Palliative Care services outpatient were explained and offered.  Daughter was agreeable to continuing advanced care planning and goals of care discussions with outpatient palliative services.  Questions and concerns were addressed. The family was  encouraged to call with questions or concerns.   Primary Decision Maker NEXT OF KIN  Code Status/Advance Care Planning: Full code Treat the treatable Daughter is open to any and all available and appropriate medical interventions  Prognosis:   Unable to determine  Discharge Planning: Skilled Nursing Facility for rehab with Palliative care service follow-up  Primary Diagnoses: Present on Admission:  Hypoglycemia  UTI (urinary tract infection)  Altered mental status  Acute metabolic encephalopathy  Alzheimer's disease (HCC)  AKI (acute kidney injury) (HCC)   Physical Exam Vitals and nursing note reviewed.  HENT:     Head: Normocephalic and atraumatic.     Mouth/Throat:     Mouth: Mucous membranes are moist.  Cardiovascular:     Rate and Rhythm: Normal rate.     Pulses: Normal pulses.  Pulmonary:     Effort: Pulmonary effort is normal.  Abdominal:     Palpations: Abdomen is soft.  Musculoskeletal:     Comments: Right side hemiparesis, right hand contracture  Skin:    General: Skin is warm and dry.  Neurological:     Mental Status: She is alert. Mental status is at baseline. She is disoriented.    Vital Signs: BP 132/90 (BP Location: Left Arm)   Pulse 84   Temp 98.8 F (37.1 C)   Resp 16   SpO2 100%  Pain Scale: 0-10   Pain Score: 0-No pain SpO2: SpO2: 100 % O2 Device:SpO2: 100 % O2 Flow Rate: .   Palliative Assessment/Data: 40%     I discussed this patient's plan of care with patient and patient's daughter Okey Regal.  Thank you for this consult. Palliative medicine will continue to follow and assist holistically.   Time Total: 70 minutes Greater than 50%  of this time was spent counseling and coordinating care related to the above assessment and plan.  Signed by: Georgiann Cocker, DNP, FNP-BC Palliative Medicine    Please contact Palliative Medicine Team phone at [phone] for questions and concerns.  For individual provider: See  Loretha Stapler

## 2021-05-26 NOTE — TOC Transition Note (Signed)
Transition of Care [facility]) - CM/SW Discharge Note   Patient Details  Name: [name] [name] MRN: [id number] Date of Birth: 03/02/1941  Transition of Care [facility]) CM/SW Contact:  Maree Krabbe, LCSW Phone Number: 05/26/2021, 2:22 PM   Clinical Narrative:   Clinical Social Worker facilitated patient discharge including contacting patient family and facility to confirm patient discharge plans.  Clinical information faxed to facility and family agreeable with plan.  CSW arranged ambulance transport via [facility] to [facility] (room 208) .  RN to call [phone] for report prior to discharge.   Final next level of care: Skilled Nursing Facility Barriers to Discharge: No Barriers Identified   Patient Goals and CMS Choice Patient states their goals for this hospitalization and ongoing recovery are:: to return to [facility]- per daughter   Choice offered to / list presented to : Adult Children  Discharge Placement              Patient chooses bed at:  ([facility]) Patient to be transferred to facility by: [facility] Name of family member notified: carol Patient and family notified of of transfer: 05/26/21  Discharge Plan and Services In-house Referral: Clinical Social Work   Post Acute Care Choice: Skilled Nursing Facility                               Social Determinants of Health (SDOH) Interventions     Readmission Risk Interventions Readmission Risk Prevention Plan 10/31/2018  Transportation Screening Complete  Medication Review Oceanographer) Complete  PCP or Specialist appointment within 3-5 days of discharge Complete  HRI or Home Care Consult Complete  SW Recovery Care/Counseling Consult Patient refused  Palliative Care Screening Complete  Skilled Nursing Facility Complete  Some recent data might be hidden

## 2021-05-26 NOTE — Evaluation (Signed)
Physical Therapy Evaluation Patient Details Name: [name] [name] MRN: [id number] DOB: [birth year] Today's Date: 05/26/2021  History of Present Illness  Tarissa Desantis is an 50yoF who comes to [facility] on 05/21/21 c concerns of increased lethargy. PMH: [id number] on 05/17/19, GERD, dementia, CAD, CVA (MCA and cerebellar), HTN, DM, esophageal ulcer, thyroid disease, macular degeneration, HTN, HLD.  Clinical Impression  Pt admitted with above diagnosis. Pt currently with functional limitations due to the deficits listed below (see "PT Problem List"). Upon entry, pt in bed, awake and interactive, but very much confused. Pts self report of PLOF very much incongruent with notes in the chart. Pt agreeable to participate. Mod-maxA to come to sitting EOB where she struggles to remain upright independently, appears to have poor awareness of postural drifting backward. Pt attempts to stand, but with maxA is unable to full rise and requests to cease. Pt assisted back to supine +2, reports to be very sleepy, tired. As pt appears to be at her baseline mentation and is a chronic LTC, total care patient,  no additional skilled PT services needed at this time, PT signing off. PT recommends daily ambulation ad lib or with nursing staff as needed to prevent deconditioning.       Recommendations for follow up therapy are one component of a multi-disciplinary discharge planning process, led by the attending physician.  Recommendations may be updated based on patient status, additional functional criteria and insurance authorization.  Follow Up Recommendations Long-term institutional care without follow-up therapy    Assistance Recommended at Discharge Intermittent Supervision/Assistance  Functional Status Assessment Patient has not had a recent decline in their functional status  Equipment Recommendations       Recommendations for Other Services       Precautions / Restrictions Precautions Precautions:  Fall Restrictions Weight Bearing Restrictions: No      Mobility  Bed Mobility Overal bed mobility: Needs Assistance Bed Mobility: Supine to Sit;Sit to Supine     Supine to sit: Max assist;+2 for physical assistance;HOB elevated Sit to supine: Max assist;+2 for physical assistance        Transfers Overall transfer level: Needs assistance Equipment used: None               General transfer comment: dependent style STS transfer attempts, unable to rise fully, pt unable to try again.    Ambulation/Gait                  Stairs            Wheelchair Mobility    Modified Rankin (Stroke Patients Only)       Balance Overall balance assessment: Needs assistance   Sitting balance-Leahy Scale: Poor Sitting balance - Comments: able to balance in short sitting for breif periods, poor postural awarenss with slow posterior sway LOB                                     Pertinent Vitals/Pain Pain Assessment: No/denies pain    Home Living Family/patient expects to be discharged to:: Assisted living (LTC resident [place])                   Additional Comments: per PT note 2020, pt was dependent, total care, hoyer transfers    Prior Function Prior Level of Function : Patient poor historian/Family not available;Needs assist  Cognitive Assist : ADLs (cognitive)   ADLs (Cognitive): Step  by step cues Physical Assist : ADLs (physical);Mobility (physical) Mobility (physical): Bed mobility;Transfers ADLs (physical): Feeding;Grooming;Bathing;Dressing;Toileting         Hand Dominance        Extremity/Trunk Assessment   Upper Extremity Assessment Upper Extremity Assessment: Generalized weakness;RUE deficits/detail RUE Deficits / Details: remote extensor hypotonia, neuro weakness posturing (pt reports due to CVA)    Lower Extremity Assessment Lower Extremity Assessment: Generalized weakness       Communication      Cognition  Arousal/Alertness: Awake/alert Behavior During Therapy: WFL for tasks assessed/performed Overall Cognitive Status: Within Functional Limits for tasks assessed                                          General Comments      Exercises     Assessment/Plan    PT Assessment All further PT needs can be met in the next venue of care  PT Problem List Decreased strength;Decreased activity tolerance;Decreased balance;Decreased mobility;Decreased safety awareness;Decreased knowledge of precautions       PT Treatment Interventions      PT Goals (Current goals can be found in the Care Plan section)  Acute Rehab PT Goals PT Goal Formulation: All assessment and education complete, DC therapy    Frequency     Barriers to discharge        Co-evaluation               AM-PAC PT "6 Clicks" Mobility  Outcome Measure Help needed turning from your back to your side while in a flat bed without using bedrails?: Total Help needed moving from lying on your back to sitting on the side of a flat bed without using bedrails?: Total Help needed moving to and from a bed to a chair (including a wheelchair)?: Total Help needed standing up from a chair using your arms (e.g., wheelchair or bedside chair)?: Total Help needed to walk in hospital room?: Total Help needed climbing 3-5 steps with a railing? : Total 6 Click Score: 6    End of Session Equipment Utilized During Treatment: Gait belt Activity Tolerance: Patient tolerated treatment well;No increased pain Patient left: in bed;with call bell/phone within reach;with bed alarm set Nurse Communication: Mobility status PT Visit Diagnosis: Muscle weakness (generalized) (M62.81)    Time: 2130-8657 PT Time Calculation (min) (ACUTE ONLY): 16 min   Charges:   PT Evaluation $PT Eval Low Complexity: 1 Low         2:50 PM, 05/26/21 Etta Grandchild, PT, DPT Physical Therapist - [facility]   [phone] ([place])     [place] 05/26/2021, 2:47 PM

## 2021-05-26 NOTE — TOC Progression Note (Signed)
Transition of Care [facility]) - Progression Note    Patient Details  Name: [name] [name] MRN: [id number] Date of Birth: 27-May-1941  [facility]) CM/SW Contact  Maree Krabbe, LCSW Phone Number: 05/26/2021, 2:21 PM  Clinical Narrative:   Confirmed with Andrianna at [facility] and they can take pt back. No PT noted needed.     Expected Discharge Plan: Skilled Nursing Facility Barriers to Discharge: Continued Medical Work up  Expected Discharge Plan and Services Expected Discharge Plan: Skilled Nursing Facility In-house Referral: Clinical Social Work   Post Acute Care Choice: Skilled Nursing Facility Living arrangements for the past 2 months: Assisted Living Facility Expected Discharge Date: 05/26/21                                     Social Determinants of Health (SDOH) Interventions    Readmission Risk Interventions Readmission Risk Prevention Plan 10/31/2018  Transportation Screening Complete  Medication Review Oceanographer) Complete  PCP or Specialist appointment within 3-5 days of discharge Complete  HRI or Home Care Consult Complete  SW Recovery Care/Counseling Consult Patient refused  Palliative Care Screening Complete  Skilled Nursing Facility Complete  Some recent data might be hidden

## 2021-05-26 NOTE — Care Management Important Message (Signed)
Important Message  Patient Details  Name: [name] [name] MRN: [id number] Date of Birth: September 13, 1940   Medicare Important Message Given:  Yes     [name] 05/26/2021, 3:09 PM

## 2021-05-26 NOTE — Plan of Care (Signed)

## 2021-05-26 NOTE — Progress Notes (Signed)
Attempted multiple times to call [facility] to give report with no answer. Did notify patient's daughter Okey Regal that transport was here to pick her up to take her back to [facility]. Patient pleasantly confused although she refuses her meds.

## 2021-05-26 NOTE — Progress Notes (Signed)
Speech Language Pathology Treatment:    Patient Details Name: [name] [name] MRN: [id number] DOB: 07-07-1941 Today's Date: 05/26/2021 Time: 6147-0929 SLP Time Calculation (min) (ACUTE ONLY): 10 min  Assessment / Plan / Recommendation Clinical Impression  Pt seen for diet tolerance. Pt observed with pureed textures and thin liquids from lunch tray. Pt without overt s/sx pharyngeal dysphagia across trials. Limited oral intake noted despite encouragement. Pt continues to demonstrate likely preference for sweeter selections from meal tray. Suspect pt is at or near swallowing baseline given clinical swallowing evaluation completed 05/30/2019.  Per chart review, WBC and temp WNL. No recent chest imaging. Pt on room air.   Recommend continuation of a pureed diet with thin liquids and safe swallowing strategies as outlined below.   SLP to sign off as pt has no acute SLP needs at this time. Further SLP needs may be addressed at SNF.   RN made aware of results, recommendations, and POC.     HPI HPI: Per 75 H&P "Tyquisha A Glorioso is a 80 y.o. female with medical history significant for Alzheimer dementia, history of remote right MCA with bilateral cerebellar infarcts, cerebral atrophy, small vessel ischemic changes, insulin-dependent diabetes mellitus, hyperlipidemia, GERD, who presents to the emergency department for chief concerns of altered mental status from [facility].     At bedside, patient was lethargic and arousable with sternal rub and loud verbal stimuli. She opened her mouth for me.      She was able to squeeze my hands, she does withdraw to mild pain stimuli, responding, "Will you STOP!". She was not abel to tell me her name, age, current location, or current year. She responds with, "Ouch" with suprapubic abdominal exam."      SLP Plan  All goals met      Recommendations for follow up therapy are one component of a multi-disciplinary discharge planning process, led by the  attending physician.  Recommendations may be updated based on patient status, additional functional criteria and insurance authorization.    Recommendations  Diet recommendations: Dysphagia 1 (puree);Thin liquid Medication Administration: Crushed with puree Supervision: Staff to assist with self feeding;Full supervision/cueing for compensatory strategies Compensations: Minimize environmental distractions;Slow rate;Small sips/bites Postural Changes and/or Swallow Maneuvers: Seated upright 90 degrees       Oral Care Recommendations: Oral care BID Follow up Recommendations: 24 hour supervision/assistance;Skilled Nursing facility SLP Visit Diagnosis: Dysphagia, oropharyngeal phase (R13.12) Plan: All goals met       Cherrie Gauze, M.S., [facility] [phone] [place])                Quintella Baton  05/26/2021, 1:37 PM

## 2021-05-26 NOTE — Discharge Summary (Addendum)
Physician Discharge Summary  [name] [id number] DOB: 1941-06-15 DOA: 05/21/2021  PCP: Venia Carbon, MD  Admit date: 05/21/2021 Discharge date: 05/26/2021  Admitted From: SNF Disposition:  SNF  Discharge Condition:Stable CODE STATUS:FULL Diet recommendation: Dysphagia 1 diet  Brief/Interim Summary: Patient is a 80 year old female with PMH significant for Alzheimer's dementia, history of remote right MCA with bilateral cerebellar infarcts, cerebral atrophy, small vessel ischemic changes, insulin-dependent diabetes, hyperlipidemia, GERD presented in the ED with complaints of altered mental status [name]. Altered mental status was thought to be secondary to hypoglycemia and possible UTI . She was started on IV antibiotics and D10W.  Blood sugars has improved.  D10W discontinued. urine culture showed proteus mirabilis.  She finished antibiotics course.  Her mental status is back to baseline .  Patient is medically stable for discharge back to skilled nursing facility.  Following problems were addressed during her hospitalization:  Acute metabolic encephalopathy This is multifactorial: Likely combination of hypoglycemia, UTI  Her mental status has improved and this is currently at baseline. Avoid narcotics, sedatives.  Discontinued gabapentin, mirtazapine.  Urinary tract infection Urine culture showed Proteus mirabilis She was treated with 5 days course of antibiotics  AKI on chronic kidney disease stage IIIb Baseline creatinine was 0.93-1.82 Suspect AKI is prerenal due to decreased intake Kidney function currently at baseline  Hypokalemia Supplemented and corrected  Alzheimer's/dementia Continue Namenda I have consulted with palliative care  Hypothyroidism: Continue levothyroxine  Diabetes type 2: She was hypoglycemic on presentation.  She was taking Lantus at the nursing facility.  Her recent hemoglobin A1c is 7.9.  We  recommend to discontinue long-acting insulin and continue only sliding scale at nursing facility .  Her blood pressure should be closely monitored   Discharge Diagnoses:  Principal Problem:   Acute metabolic encephalopathy Active Problems:   H/O: CVA (cerebrovascular accident)   Altered mental status   Alzheimer's disease ([place])   UTI (urinary tract infection)   Hypoglycemia   AKI (acute kidney injury) [facility])    Discharge Instructions  Discharge Instructions     Diet general   Complete by: As directed    Dysphagia 1 diet   Discharge instructions   Complete by: As directed    1)Please take your medications as instructed 2)Monitor your blood sugars 3)Do a BMP and phosphorous level check in a week   Increase activity slowly   Complete by: As directed       Allergies as of 05/26/2021       Reactions   Fexofenadine Hcl Rash        Medication List     STOP taking these medications    citalopram 10 MG tablet Commonly known as: CELEXA   gabapentin 100 MG capsule Commonly known as: NEURONTIN   gabapentin 300 MG capsule Commonly known as: NEURONTIN   Lantus SoloStar 100 UNIT/ML Solostar Pen Generic drug: insulin glargine   mirtazapine 15 MG tablet Commonly known as: REMERON   mirtazapine 7.5 MG tablet Commonly known as: REMERON       TAKE these medications    acetaminophen 325 MG tablet Commonly known as: TYLENOL Take 650 mg by mouth every 4 (four) hours as needed.   aluminum hydroxide ointment Commonly known as: DERMAGRAN Apply topically every 12 (twelve) hours as needed. Skin protectant to buttocks   DULoxetine 30 MG capsule Commonly known as: CYMBALTA Take 30 mg by mouth daily.   guaiFENesin 100 MG/5ML liquid Commonly known as:  ROBITUSSIN Take 200 mg by mouth every 4 (four) hours as needed for cough.   hydroxypropyl methylcellulose / hypromellose 2.5 % ophthalmic solution Commonly known as: ISOPTO TEARS / GONIOVISC Place 1 drop into both  eyes 3 (three) times daily as needed for dry eyes.   insulin regular 100 units/mL injection Commonly known as: NOVOLIN R Inject 0-16 Units into the skin every 4 (four) hours as needed for high blood sugar. Sliding Scale: 70-130 (0 unit);131-180 (4units); 181-240 (8 units); 241-300 (10 units); 301-350 (12 units); 351-400 (14 units); >401 (16 units)   levothyroxine 88 MCG tablet Commonly known as: SYNTHROID Take 88 mcg by mouth every evening.   lovastatin 20 MG tablet Commonly known as: MEVACOR Take 1 tablet (20 mg total) by mouth every evening.   memantine 10 MG tablet Commonly known as: NAMENDA Take 10 mg by mouth 2 (two) times daily.   Menthol 2 MG Lozg Use as directed 1 lozenge in the mouth or throat every 2 (two) hours as needed (FOR COUGH).   omeprazole 20 MG capsule Commonly known as: PRILOSEC Take 20 mg by mouth at bedtime as needed.   ondansetron 4 MG tablet Commonly known as: ZOFRAN Take 4 mg by mouth daily as needed for nausea or vomiting.   phosphorus 155-852-130 MG tablet Commonly known as: K PHOS NEUTRAL Take 2 tablets (500 mg total) by mouth daily for 4 days. Start taking on: May 27, 2021   polyethylene glycol 17 g packet Commonly known as: MIRALAX / GLYCOLAX Take 17 g by mouth daily.   potassium chloride SA 20 MEQ tablet Commonly known as: KLOR-CON Take 2 tablets (40 mEq total) by mouth daily for 5 days. Start taking on: May 27, 2021   senna-docusate 8.6-50 MG tablet Commonly known as: Senokot-S Take 2 tablets by mouth daily.   Vitamin D3 1.25 MG (50000 UT) Caps Take 1 capsule by mouth every 30 (thirty) days.        Follow-up Information     Venia Carbon, MD. Schedule an appointment as soon as possible for a visit in 1 week(s).   Specialties: Internal Medicine, Pediatrics Contact information: [place] [address] [phone]                Allergies  Allergen Reactions   Fexofenadine Hcl Rash     Consultations: None   Procedures/Studies: CT HEAD WO CONTRAST (5MM)  Result Date: 05/21/2021 CLINICAL DATA:  Mental status changes.  Unresponsive. EXAM: CT HEAD WITHOUT CONTRAST TECHNIQUE: Contiguous axial images were obtained from the base of the skull through the vertex without intravenous contrast. COMPARISON:  /03/2019 FINDINGS: Brain: Advanced cerebral and cerebellar atrophy. Resultant ventriculomegaly. Again identified is a right MCA remote infarct. Multiple bilateral remote cerebellar infarcts also identified. Moderate to marked low density in the periventricular white matter likely related to small vessel disease. No acute hemorrhage, cortically based infarct, intra-axial, or extra-axial fluid collection. Vascular: Intracranial atherosclerosis. Skull: Hyperostosis frontalis interna. Sinuses/Orbits: Normal imaged portions of the orbits and globes. Clear paranasal sinuses and mastoid air cells. Other: None. IMPRESSION: 1. No acute intracranial abnormality. 2. Cerebral atrophy and small vessel ischemic change. 3. Remote right MCA and bilateral cerebellar infarcts. Electronically Signed   By: Abigail Miyamoto M.D.   On: 05/21/2021 10:15   DG Chest Port 1 View  Result Date: 05/21/2021 CLINICAL DATA:  Altered mental status.  80 year old female. EXAM: PORTABLE CHEST 1 VIEW COMPARISON:  May 28, 2019 FINDINGS: EKG leads project over  the chest. Image rotated to the RIGHT. Heart size and mediastinal contours are stable. There is no lobar consolidative process. No visible pneumothorax. Mild increased density at the LEFT lung base but with improved aeration in this area since previous imaging. On limited assessment there is no acute skeletal process. IMPRESSION: Increased aeration since a remote prior at the LEFT lung base, persistent opacity may represent atelectasis or developing infection. Otherwise stable appearance of the chest. Electronically Signed   By: Zetta Bills M.D.   On: 05/21/2021 12:33       Subjective: Patient seen and examined at the bedside this morning.  Hemodynamically stable.  She was alert and awake and obeys commands.  She knew the current month.  I called the daughter on phone to discuss about discharge planning, call not received  Discharge Exam: Vitals:   05/26/21 1120 05/26/21 1513  BP: 132/90 129/84  Pulse: 84 88  Resp: 16 20  Temp: 98.8 F (37.1 C) 98.4 F (36.9 C)  SpO2: 100% 96%   Vitals:   05/26/21 0430 05/26/21 0813 05/26/21 1120 05/26/21 1513  BP: (!) 133/96 (!) 125/102 132/90 129/84  Pulse: 88 90 84 88  Resp: 20 16 16 20   Temp: 97.8 F (36.6 C) 98 F (36.7 C) 98.8 F (37.1 C) 98.4 F (36.9 C)  TempSrc: Oral     SpO2: 100% 99% 100% 96%    General: Pt is alert, awake, not in acute distress, pleasant elderly female Cardiovascular: RRR, S1/S2 +, no rubs, no gallops Respiratory: CTA bilaterally, no wheezing, no rhonchi Abdominal: Soft, NT, ND, bowel sounds + Extremities: no edema, no cyanosis    The results of significant diagnostics from this hospitalization (including imaging, microbiology, ancillary and laboratory) are listed below for reference.     Microbiology: Recent Results (from the past 240 hour(s))  Resp Panel by RT-PCR (Flu A&B, Covid) Nasopharyngeal Swab     Status: None   Collection Time: 05/21/21  9:23 AM   Specimen: Nasopharyngeal Swab; Nasopharyngeal(NP) swabs in vial transport medium  Result Value Ref Range Status   SARS Coronavirus 2 by RT PCR NEGATIVE NEGATIVE Final    Comment: (NOTE) SARS-CoV-2 target nucleic acids are NOT DETECTED.  The SARS-CoV-2 RNA is generally detectable in upper respiratory specimens during the acute phase of infection. The lowest concentration of SARS-CoV-2 viral copies this assay can detect is 138 copies/mL. A negative result does not preclude SARS-Cov-2 infection and should not be used as the sole basis for treatment or other patient management decisions. A negative result may  occur with  improper specimen collection/handling, submission of specimen other than nasopharyngeal swab, presence of viral mutation(s) within the areas targeted by this assay, and inadequate number of viral copies(<138 copies/mL). A negative result must be combined with clinical observations, patient history, and epidemiological information. The expected result is Negative.  Fact Sheet for Patients:  EntrepreneurPulse.com.au  Fact Sheet for Healthcare Providers:  IncredibleEmployment.be  This test is no t yet approved or cleared by the [place] FDA and  has been authorized for detection and/or diagnosis of SARS-CoV-2 by FDA under an Emergency Use Authorization (EUA). This EUA will remain  in effect (meaning this test can be used) for the duration of the COVID-19 declaration under Section 564(b)(1) of the Act, 21 U.S.C.section 360bbb-3(b)(1), unless the authorization is terminated  or revoked sooner.       Influenza A by PCR NEGATIVE NEGATIVE Final   Influenza B by PCR NEGATIVE NEGATIVE Final  Comment: (NOTE) The Xpert Xpress SARS-CoV-2/FLU/RSV plus assay is intended as an aid in the diagnosis of influenza from Nasopharyngeal swab specimens and should not be used as a sole basis for treatment. Nasal washings and aspirates are unacceptable for Xpert Xpress SARS-CoV-2/FLU/RSV testing.  Fact Sheet for Patients: EntrepreneurPulse.com.au  Fact Sheet for Healthcare Providers: IncredibleEmployment.be  This test is not yet approved or cleared by the [place] FDA and has been authorized for detection and/or diagnosis of SARS-CoV-2 by FDA under an Emergency Use Authorization (EUA). This EUA will remain in effect (meaning this test can be used) for the duration of the COVID-19 declaration under Section 564(b)(1) of the Act, 21 U.S.C. section 360bbb-3(b)(1), unless the authorization is terminated  or revoked.  Performed at [facility], [address] [place], [place] [address]   Urine Culture     Status: Abnormal   Collection Time: 05/21/21  9:25 AM   Specimen: Urine, Clean Catch  Result Value Ref Range Status   Specimen Description   Final    URINE, CLEAN CATCH Performed at [name] [address] [place], [place] [address]    Special Requests   Final    NONE Performed at [name], [place], [place] [address]    Culture >=100,000 COLONIES/mL PROTEUS MIRABILIS (A)  Final   Report Status 05/23/2021 FINAL  Final   Organism ID, Bacteria PROTEUS MIRABILIS (A)  Final      Susceptibility   Proteus mirabilis - MIC*    AMPICILLIN <=2 SENSITIVE Sensitive     CEFAZOLIN <=4 SENSITIVE Sensitive     CEFEPIME <=0.12 SENSITIVE Sensitive     CEFTRIAXONE <=0.25 SENSITIVE Sensitive     CIPROFLOXACIN 2 RESISTANT Resistant     GENTAMICIN <=1 SENSITIVE Sensitive     IMIPENEM 1 SENSITIVE Sensitive     NITROFURANTOIN 128 RESISTANT Resistant     TRIMETH/SULFA <=20 SENSITIVE Sensitive     AMPICILLIN/SULBACTAM <=2 SENSITIVE Sensitive     PIP/TAZO <=4 SENSITIVE Sensitive     * >=100,000 COLONIES/mL PROTEUS MIRABILIS  MRSA Next Gen by PCR, Nasal     Status: None   Collection Time: 05/22/21  1:09 PM   Specimen: Nasal Mucosa; Nasal Swab  Result Value Ref Range Status   MRSA by PCR Next Gen NOT DETECTED NOT DETECTED Final    Comment: (NOTE) The GeneXpert MRSA Assay (FDA approved for NASAL specimens only), is one component of a comprehensive MRSA colonization surveillance program. It is not intended to diagnose MRSA infection nor to guide or monitor treatment for MRSA infections. Test performance is not FDA approved in patients less than 54 years old. Performed at [name], [address] [place], [place] [address]   Resp Panel by RT-PCR (Flu A&B, Covid) Nasopharyngeal Swab     Status: None   Collection Time: 05/26/21  11:40 AM   Specimen: Nasopharyngeal Swab; Nasopharyngeal(NP) swabs in vial transport medium  Result Value Ref Range Status   SARS Coronavirus 2 by RT PCR NEGATIVE NEGATIVE Final    Comment: (NOTE) SARS-CoV-2 target nucleic acids are NOT DETECTED.  The SARS-CoV-2 RNA is generally detectable in upper respiratory specimens during the acute phase of infection. The lowest concentration of SARS-CoV-2 viral copies this assay can detect is 138 copies/mL. A negative result does not preclude SARS-Cov-2 infection and should not be used as the sole basis for treatment or other patient management decisions. A negative result may occur with  improper specimen collection/handling, submission of  specimen other than nasopharyngeal swab, presence of viral mutation(s) within the areas targeted by this assay, and inadequate number of viral copies(<138 copies/mL). A negative result must be combined with clinical observations, patient history, and epidemiological information. The expected result is Negative.  Fact Sheet for Patients:  EntrepreneurPulse.com.au  Fact Sheet for Healthcare Providers:  IncredibleEmployment.be  This test is no t yet approved or cleared by the [place] FDA and  has been authorized for detection and/or diagnosis of SARS-CoV-2 by FDA under an Emergency Use Authorization (EUA). This EUA will remain  in effect (meaning this test can be used) for the duration of the COVID-19 declaration under Section 564(b)(1) of the Act, 21 U.S.C.section 360bbb-3(b)(1), unless the authorization is terminated  or revoked sooner.       Influenza A by PCR NEGATIVE NEGATIVE Final   Influenza B by PCR NEGATIVE NEGATIVE Final    Comment: (NOTE) The Xpert Xpress SARS-CoV-2/FLU/RSV plus assay is intended as an aid in the diagnosis of influenza from Nasopharyngeal swab specimens and should not be used as a sole basis for treatment. Nasal washings and aspirates  are unacceptable for Xpert Xpress SARS-CoV-2/FLU/RSV testing.  Fact Sheet for Patients: EntrepreneurPulse.com.au  Fact Sheet for Healthcare Providers: IncredibleEmployment.be  This test is not yet approved or cleared by the [place] FDA and has been authorized for detection and/or diagnosis of SARS-CoV-2 by FDA under an Emergency Use Authorization (EUA). This EUA will remain in effect (meaning this test can be used) for the duration of the COVID-19 declaration under Section 564(b)(1) of the Act, 21 U.S.C. section 360bbb-3(b)(1), unless the authorization is terminated or revoked.  Performed at [facility], [address] [place], [place] [address]      Labs: BNP (last 3 results) No results for input(s): BNP in the last 8760 hours. Basic Metabolic Panel: Recent Labs  Lab 05/21/21 0828 05/22/21 0915 05/23/21 0926 05/24/21 1625 05/25/21 0638 05/26/21 1204  NA 146* 137 138  --  139 139  K 4.1 3.2* 2.9*  --  4.8 3.1*  CL 107 104 103  --  107 103  CO2 30 26 25   --  18* 24  GLUCOSE 75 288* 160*  --  120* 95  BUN 31* 17 11  --  12 10  CREATININE 2.36* 1.31* 1.39*  --  1.55* 1.34*  CALCIUM 9.5 8.0* 7.7*  --  8.1* 8.6*  MG 2.1  --  1.3* 1.9  --   --   PHOS 4.0  --  2.2* 2.2*  --  1.5*   Liver Function Tests: Recent Labs  Lab 05/21/21 0828  AST 19  ALT 7  ALKPHOS 89  BILITOT 0.9  PROT 7.8  ALBUMIN 3.4*   No results for input(s): LIPASE, AMYLASE in the last 168 hours. No results for input(s): AMMONIA in the last 168 hours. CBC: Recent Labs  Lab 05/21/21 0828 05/22/21 0915 05/23/21 0926 05/24/21 1625  WBC 10.2 6.5 8.1 9.3  HGB 15.8* 13.6 13.4 12.9  HCT 49.2* 43.5 41.9 39.9  MCV 84.5 86.5 83.3 82.1  PLT 246 165 167 167   Cardiac Enzymes: No results for input(s): CKTOTAL, CKMB, CKMBINDEX, TROPONINI in the last 168 hours. BNP: Invalid input(s): POCBNP CBG: Recent Labs  Lab 05/25/21 2012 05/25/21 2354  05/26/21 0424 05/26/21 0848 05/26/21 1608  GLUCAP 94 100* 115* 135* 143*   D-Dimer No results for input(s): DDIMER in the last 72 hours. Hgb A1c No results for input(s): HGBA1C in the last  72 hours. Lipid Profile No results for input(s): CHOL, HDL, LDLCALC, TRIG, CHOLHDL, LDLDIRECT in the last 72 hours. Thyroid function studies No results for input(s): TSH, T4TOTAL, T3FREE, THYROIDAB in the last 72 hours.  Invalid input(s): FREET3 Anemia work up No results for input(s): VITAMINB12, FOLATE, FERRITIN, TIBC, IRON, RETICCTPCT in the last 72 hours. Urinalysis    Component Value Date/Time   COLORURINE YELLOW (A) 05/21/2021 0925   APPEARANCEUR HAZY (A) 05/21/2021 0925   APPEARANCEUR Clear 11/16/2013 2342   LABSPEC 1.010 05/21/2021 0925   LABSPEC 1.019 11/16/2013 2342   PHURINE 8.0 05/21/2021 0925   GLUCOSEU >=500 (A) 05/21/2021 0925   GLUCOSEU 150 mg/dL 32/95/1884 1660   HGBUR SMALL (A) 05/21/2021 0925   BILIRUBINUR NEGATIVE 05/21/2021 0925   BILIRUBINUR Negative 11/16/2013 2342   KETONESUR NEGATIVE 05/21/2021 0925   PROTEINUR 100 (A) 05/21/2021 0925   UROBILINOGEN 0.2 09/22/2011 1412   NITRITE NEGATIVE 05/21/2021 0925   LEUKOCYTESUR LARGE (A) 05/21/2021 0925   LEUKOCYTESUR Negative 11/16/2013 2342   Sepsis Labs Invalid input(s): PROCALCITONIN,  WBC,  LACTICIDVEN Microbiology Recent Results (from the past 240 hour(s))  Resp Panel by RT-PCR (Flu A&B, Covid) Nasopharyngeal Swab     Status: None   Collection Time: 05/21/21  9:23 AM   Specimen: Nasopharyngeal Swab; Nasopharyngeal(NP) swabs in vial transport medium  Result Value Ref Range Status   SARS Coronavirus 2 by RT PCR NEGATIVE NEGATIVE Final    Comment: (NOTE) SARS-CoV-2 target nucleic acids are NOT DETECTED.  The SARS-CoV-2 RNA is generally detectable in upper respiratory specimens during the acute phase of infection. The lowest concentration of SARS-CoV-2 viral copies this assay can detect is 138 copies/mL. A  negative result does not preclude SARS-Cov-2 infection and should not be used as the sole basis for treatment or other patient management decisions. A negative result may occur with  improper specimen collection/handling, submission of specimen other than nasopharyngeal swab, presence of viral mutation(s) within the areas targeted by this assay, and inadequate number of viral copies(<138 copies/mL). A negative result must be combined with clinical observations, patient history, and epidemiological information. The expected result is Negative.  Fact Sheet for Patients:  BloggerCourse.com  Fact Sheet for Healthcare Providers:  SeriousBroker.it  This test is no t yet approved or cleared by the [place] FDA and  has been authorized for detection and/or diagnosis of SARS-CoV-2 by FDA under an Emergency Use Authorization (EUA). This EUA will remain  in effect (meaning this test can be used) for the duration of the COVID-19 declaration under Section 564(b)(1) of the Act, 21 U.S.C.section 360bbb-3(b)(1), unless the authorization is terminated  or revoked sooner.       Influenza A by PCR NEGATIVE NEGATIVE Final   Influenza B by PCR NEGATIVE NEGATIVE Final    Comment: (NOTE) The Xpert Xpress SARS-CoV-2/FLU/RSV plus assay is intended as an aid in the diagnosis of influenza from Nasopharyngeal swab specimens and should not be used as a sole basis for treatment. Nasal washings and aspirates are unacceptable for Xpert Xpress SARS-CoV-2/FLU/RSV testing.  Fact Sheet for Patients: BloggerCourse.com  Fact Sheet for Healthcare Providers: SeriousBroker.it  This test is not yet approved or cleared by the [place] FDA and has been authorized for detection and/or diagnosis of SARS-CoV-2 by FDA under an Emergency Use Authorization (EUA). This EUA will remain in effect (meaning this test can  be used) for the duration of the COVID-19 declaration under Section 564(b)(1) of the Act, 21 U.S.C. section 360bbb-3(b)(1), unless the authorization  is terminated or revoked.  Performed at [facility], [address] [place], [place] [address]   Urine Culture     Status: Abnormal   Collection Time: 05/21/21  9:25 AM   Specimen: Urine, Clean Catch  Result Value Ref Range Status   Specimen Description   Final    URINE, CLEAN CATCH Performed at [facility], [address], [place], [place] [address]    Special Requests   Final    NONE Performed at [facility], [place], [place] [address]    Culture >=100,000 COLONIES/mL PROTEUS MIRABILIS (A)  Final   Report Status 05/23/2021 FINAL  Final   Organism ID, Bacteria PROTEUS MIRABILIS (A)  Final      Susceptibility   Proteus mirabilis - MIC*    AMPICILLIN <=2 SENSITIVE Sensitive     CEFAZOLIN <=4 SENSITIVE Sensitive     CEFEPIME <=0.12 SENSITIVE Sensitive     CEFTRIAXONE <=0.25 SENSITIVE Sensitive     CIPROFLOXACIN 2 RESISTANT Resistant     GENTAMICIN <=1 SENSITIVE Sensitive     IMIPENEM 1 SENSITIVE Sensitive     NITROFURANTOIN 128 RESISTANT Resistant     TRIMETH/SULFA <=20 SENSITIVE Sensitive     AMPICILLIN/SULBACTAM <=2 SENSITIVE Sensitive     PIP/TAZO <=4 SENSITIVE Sensitive     * >=100,000 COLONIES/mL PROTEUS MIRABILIS  MRSA Next Gen by PCR, Nasal     Status: None   Collection Time: 05/22/21  1:09 PM   Specimen: Nasal Mucosa; Nasal Swab  Result Value Ref Range Status   MRSA by PCR Next Gen NOT DETECTED NOT DETECTED Final    Comment: (NOTE) The GeneXpert MRSA Assay (FDA approved for NASAL specimens only), is one component of a comprehensive MRSA colonization surveillance program. It is not intended to diagnose MRSA infection nor to guide or monitor treatment for MRSA infections. Test performance is not FDA approved in patients less than 26 years old. Performed at [name], [address] [place], [place] [address]   Resp Panel by RT-PCR (Flu A&B, Covid) Nasopharyngeal Swab     Status: None   Collection Time: 05/26/21 11:40 AM   Specimen: Nasopharyngeal Swab; Nasopharyngeal(NP) swabs in vial transport medium  Result Value Ref Range Status   SARS Coronavirus 2 by RT PCR NEGATIVE NEGATIVE Final    Comment: (NOTE) SARS-CoV-2 target nucleic acids are NOT DETECTED.  The SARS-CoV-2 RNA is generally detectable in upper respiratory specimens during the acute phase of infection. The lowest concentration of SARS-CoV-2 viral copies this assay can detect is 138 copies/mL. A negative result does not preclude SARS-Cov-2 infection and should not be used as the sole basis for treatment or other patient management decisions. A negative result may occur with  improper specimen collection/handling, submission of specimen other than nasopharyngeal swab, presence of viral mutation(s) within the areas targeted by this assay, and inadequate number of viral copies(<138 copies/mL). A negative result must be combined with clinical observations, patient history, and epidemiological information. The expected result is Negative.  Fact Sheet for Patients:  EntrepreneurPulse.com.au  Fact Sheet for Healthcare Providers:  IncredibleEmployment.be  This test is no t yet approved or cleared by the [place] FDA and  has been authorized for detection and/or diagnosis of SARS-CoV-2 by FDA under an Emergency Use Authorization (EUA). This EUA will remain  in effect (meaning this test can be used) for the duration of the COVID-19 declaration under Section 564(b)(1) of the Act, 21 U.S.C.section 360bbb-3(b)(1), unless the  authorization is terminated  or revoked sooner.       Influenza A by PCR NEGATIVE NEGATIVE Final   Influenza B by PCR NEGATIVE NEGATIVE Final    Comment: (NOTE) The Xpert Xpress SARS-CoV-2/FLU/RSV plus assay is  intended as an aid in the diagnosis of influenza from Nasopharyngeal swab specimens and should not be used as a sole basis for treatment. Nasal washings and aspirates are unacceptable for Xpert Xpress SARS-CoV-2/FLU/RSV testing.  Fact Sheet for Patients: EntrepreneurPulse.com.au  Fact Sheet for Healthcare Providers: IncredibleEmployment.be  This test is not yet approved or cleared by the [place] FDA and has been authorized for detection and/or diagnosis of SARS-CoV-2 by FDA under an Emergency Use Authorization (EUA). This EUA will remain in effect (meaning this test can be used) for the duration of the COVID-19 declaration under Section 564(b)(1) of the Act, 21 U.S.C. section 360bbb-3(b)(1), unless the authorization is terminated or revoked.  Performed at [facility], [address] [place], [place] [address]     Please note: You were cared for by a hospitalist during your [name] stay. Once you are discharged, your primary care physician will handle any further medical issues. Please note that NO REFILLS for any discharge medications will be authorized once you are discharged, as it is imperative that you return to your primary care physician (or establish a relationship with a primary care physician if you do not have one) for your post [name] discharge needs so that they can reassess your need for medications and monitor your lab values.    Time coordinating discharge: 40 minutes  SIGNED:   Shelly Coss, MD  [facility] 05/26/2021, 4:27 PM Pager [phone]  If 7PM-7AM, please contact night-coverage [email or web address] Password [email or web address]

## 2021-05-27 DIAGNOSIS — N390 Urinary tract infection, site not specified: Secondary | ICD-10-CM

## 2021-05-27 DIAGNOSIS — E1142 Type 2 diabetes mellitus with diabetic polyneuropathy: Secondary | ICD-10-CM

## 2021-05-27 DIAGNOSIS — N179 Acute kidney failure, unspecified: Secondary | ICD-10-CM

## 2021-05-27 DIAGNOSIS — E440 Moderate protein-calorie malnutrition: Secondary | ICD-10-CM

## 2021-06-06 ENCOUNTER — Non-Acute Institutional Stay: Payer: Medicare Other | Admitting: Student

## 2021-06-06 ENCOUNTER — Other Ambulatory Visit: Payer: Self-pay

## 2021-06-06 ENCOUNTER — Encounter: Payer: Self-pay | Admitting: Oncology

## 2021-06-06 NOTE — Progress Notes (Signed)
[facility] Consult Note Telephone: [phone]  Fax: [phone]   Date of encounter: 06/06/21  PATIENT NAME: [name] [name] [address] [place] [address]   [phone] (home)  DOB: 08/08/1940 MRN: [id number] PRIMARY CARE PROVIDER:    Venia Carbon, MD,  [place] [address] [phone]  REFERRING PROVIDER:   Venia Carbon, MD [address] [place],  [place] [address] [phone]  RESPONSIBLE PARTY:    Contact Information     Name Relation Home Work Mobile   [name] [name] Daughter [phone]  [phone]   [name] [name] Relative [phone]  [phone]        I met face to face with patient  in the facility. Palliative Care was asked to follow this patient by consultation request of  [name] Carbon, MD to address advance care planning and complex medical decision making. This is the initial visit.   Discussed with daughter [name] [name] via telephone.                                    ASSESSMENT AND PLAN / RECOMMENDATIONS:   Advance Care Planning/Goals of Care: Goals include to maximize quality of life and symptom management. Patient/health care surrogate gave his/her permission to discuss.Our advance care planning conversation included a discussion about:    The value and importance of advance care planning  Experiences with loved ones who have been seriously ill or have died  Exploration of personal, cultural or spiritual beliefs that might influence medical decisions  Exploration of goals of care in the event of a sudden injury or illness  Identification and preparation of a healthcare agent  Review and updating or creation of an  advance directive document. Decision not to resuscitate or to de-escalate disease focused treatments due to poor prognosis. CODE STATUS: Full Code  Education provided on Palliative Medicine vs. Hospice services. We discussed patient's  diagnoses, recent declines, poor appetite. Discussed code status, what CPR would look like for patient, quality of life. Daughter states she will likely proceed with DNR for patient. She states she will reach out to facility. Patient is a DNH. [name] [name] is unsure about a feeding tube; she wants to discuss further with patient/family. Palliative medicine will continue to provide support.   Symptom Management/Plan:  Alzheimer's dementia-FAST Score 7C-patient is dependent for all adl's. Staff to continue assisting with/anticipating all adl care needs. Reorient and redirect as needed. Monitor for falls safety. Monitor for aspiration.   Protein calorie malnutrition/weight loss/decline in appetite-albumin 3.4. patient continues with poor appetite; continue Thrive nutritional supplement BID. Offer foods patient enjoys.    Follow up Palliative Care Visit: Palliative care will continue to follow for complex medical decision making, advance care planning, and clarification of goals. Return in 3-4 weeks or prn.  I spent 45 minutes providing this consultation. More than 50% of the time in this consultation was spent in counseling and care coordination.   PPS: 30%  HOSPICE ELIGIBILITY/DIAGNOSIS: TBD  Chief Complaint: Palliative Medicine initial visit.   HISTORY OF PRESENT ILLNESS:  [name] [name] is a 80 y.o. year old female  with Alzheimer's dementia, hx of remote right MCA with bilateral cerebellar infarcts, small vessel ischemic changes, T2DM, hyperlipidemia, GERD. Recently in the hospital 11/2-11/01/2021 due to acute metabolic encephalopathy, UTI, AKI on CKD 3b.  Patient currently resides at [facility]. She is dependent  for all adl's. She is not eating well; poor appetite. Facility staff has tried multiple approaches, she is back on a liberalized diet. Staff states she will not drink ensures; she is receiving Thrive nutritional supplement BID. Daughter states she has also attempted to feed with little  success. Weight 117 pounds 06/04/2021; weight 130.2 pounds 03/19/2021. Patient also refusing medications at times. She denies pain. No shortness of breath reported or observed. She is out of bed daily via hoyer lift. She is sleeping more during the day. Communication has declined; she is speaking few words. No falls reported. No skin breakdown per staff. HPI and ROS primarily obtained from staff due to patient's impaired cognition. Patient will shake her head yes/no to few questions.    History obtained from review of EMR, discussion with primary team, and interview with family, facility staff/caregiver and/or [name] [name].  I reviewed available labs, medications, imaging, studies and related documents from the EMR.  Records reviewed and summarized above.   ROS  Unable to obtain due to dementia.    Physical Exam: Weight: 117 pounds Constitutional: NAD General: frail appearing, thin  EYES: anicteric sclera, lids intact, no discharge  ENMT: intact hearing, oral mucous membranes moist CV: S1S2, RRR, no LE edema Pulmonary: LCTA, no increased work of breathing, no cough, room air Abdomen:normo-active BS + 4 quadrants, soft and non tender GU: deferred MSK: sarcopenia, non ambulatory, right wrist contracture Skin: warm and dry, no rashes or wounds on visible skin Neuro: generalized weakness, A & O x 1 Psych: non-anxious affect Hem/lymph/immuno: no widespread bruising CURRENT PROBLEM LIST:  Patient Active Problem List   Diagnosis Date Noted   Hypoglycemia 05/21/2021   AKI (acute kidney injury) ([place]) 93/81/0175   Acute metabolic encephalopathy 05/13/8526   UTI (urinary tract infection) 05/26/2019   Pneumonia due to COVID-19 virus 05/26/2019   Sepsis ([place]) 10/27/2018   Iron deficiency anemia due to chronic blood loss 09/06/2018   Pressure injury of skin 09/04/2018   ARF (acute renal failure) ([place]) 09/03/2018   GI bleed 07/15/2018   DKA (diabetic ketoacidoses) 03/18/2018   Upper GI bleed  10/16/2015   Intractable nausea and vomiting 10/16/2015   Hypertensive urgency 10/16/2015   Acute delirium 05/30/2015   Alzheimer's disease ([place]) 05/30/2015   Altered mental status 05/24/2015   Occlusion and stenosis of carotid artery without mention of cerebral infarction 10/08/2011   CKD (chronic kidney disease) 09/25/2011   H/O: CVA (cerebrovascular accident) 09/22/2011   Diabetes mellitus, type 2 ([place]) 09/22/2011   PAST MEDICAL HISTORY:  Active Ambulatory Problems    Diagnosis Date Noted   H/O: CVA (cerebrovascular accident) 09/22/2011   Diabetes mellitus, type 2 ([place]) 09/22/2011   CKD (chronic kidney disease) 09/25/2011   Occlusion and stenosis of carotid artery without mention of cerebral infarction 10/08/2011   Altered mental status 05/24/2015   Acute delirium 05/30/2015   Alzheimer's disease ([place]) 05/30/2015   Upper GI bleed 10/16/2015   Intractable nausea and vomiting 10/16/2015   Hypertensive urgency 10/16/2015   DKA (diabetic ketoacidoses) 03/18/2018   GI bleed 07/15/2018   ARF (acute renal failure) ([place]) 09/03/2018   Pressure injury of skin 09/04/2018   Iron deficiency anemia due to chronic blood loss 09/06/2018   Sepsis ([place]) 10/27/2018   UTI (urinary tract infection) 05/26/2019   Pneumonia due to COVID-19 virus 78/24/2353   Acute metabolic encephalopathy 61/44/3154   Hypoglycemia 05/21/2021   AKI (acute kidney injury) ([place]) 05/21/2021   Resolved Ambulatory Problems    Diagnosis Date  Noted   Nausea and vomiting 09/22/2011   HTN (hypertension) 09/22/2011   NSTEMI (non-ST elevated myocardial infarction) ([place]) 05/24/2015   Past Medical History:  Diagnosis Date   Allergic rhinitis    CAD (coronary artery disease)    Carotid artery occlusion 02/15/2009   CVA (cerebral infarction)    Dementia (HCC)    Diabetes mellitus    Gastritis    GERD (gastroesophageal reflux disease)    Heart murmur    Hematemesis    Hemiplegia (HCC)    HLD (hyperlipidemia)     Hypertension    Hypertensive chronic kidney disease    Macular degeneration    Persistent mood (affective) disorder, unspecified ([place])    Stroke ([place])    Thyroid disease    Ulcer of esophagus without bleeding    SOCIAL HX:  Social History   Tobacco Use   Smoking status: Former    Types: Cigarettes    Quit date: 07/20/1985    Years since quitting: 35.9   Smokeless tobacco: Never  Substance Use Topics   Alcohol use: No   FAMILY HX:  Family History  Problem Relation Age of Onset   Diabetes Mother    Hypertension Mother    Diabetes Father    Hypertension Father    Diabetes Sister    Hypertension Sister      ALLERGIES:  Allergies  Allergen Reactions   Fexofenadine Hcl Rash     PERTINENT MEDICATIONS:  Outpatient Encounter Medications as of 06/06/2021  Medication Sig   acetaminophen (TYLENOL) 325 MG tablet Take 650 mg by mouth every 4 (four) hours as needed.   aluminum hydroxide (DERMAGRAN) ointment Apply topically every 12 (twelve) hours as needed. Skin protectant to buttocks   Cholecalciferol (VITAMIN D3) 50000 units CAPS Take 1 capsule by mouth every 30 (thirty) days.    DULoxetine (CYMBALTA) 30 MG capsule Take 30 mg by mouth daily.   guaiFENesin (ROBITUSSIN) 100 MG/5ML liquid Take 200 mg by mouth every 4 (four) hours as needed for cough.   hydroxypropyl methylcellulose / hypromellose (ISOPTO TEARS / GONIOVISC) 2.5 % ophthalmic solution Place 1 drop into both eyes 3 (three) times daily as needed for dry eyes.   insulin regular (NOVOLIN R) 100 units/mL injection Inject 0-16 Units into the skin every 4 (four) hours as needed for high blood sugar. Sliding Scale: 70-130 (0 unit);131-180 (4units); 181-240 (8 units); 241-300 (10 units); 301-350 (12 units); 351-400 (14 units); >401 (16 units) (Patient not taking: Reported on 12/29/2019)   levothyroxine (SYNTHROID, LEVOTHROID) 88 MCG tablet Take 88 mcg by mouth every evening.    lovastatin (MEVACOR) 20 MG tablet Take 1 tablet (20 mg  total) by mouth every evening.   memantine (NAMENDA) 10 MG tablet Take 10 mg by mouth 2 (two) times daily.    Menthol 2 MG LOZG Use as directed 1 lozenge in the mouth or throat every 2 (two) hours as needed (FOR COUGH).    omeprazole (PRILOSEC) 20 MG capsule Take 20 mg by mouth at bedtime as needed.   ondansetron (ZOFRAN) 4 MG tablet Take 4 mg by mouth daily as needed for nausea or vomiting.    polyethylene glycol (MIRALAX / GLYCOLAX) packet Take 17 g by mouth daily.    potassium chloride SA (KLOR-CON) 20 MEQ tablet Take 2 tablets (40 mEq total) by mouth daily for 5 days.   senna-docusate (SENOKOT-S) 8.6-50 MG tablet Take 2 tablets by mouth daily.   No facility-administered encounter medications on file as of 06/06/2021.  Thank you for the opportunity to participate in the care of Ms. Sampley.  The palliative care team will continue to follow. Please call our office at [phone] if we can be of additional assistance.   Ezekiel Slocumb, NP   COVID-19 PATIENT SCREENING TOOL Asked and negative response unless otherwise noted:  Have you had symptoms of covid, tested positive or been in contact with someone with symptoms/positive test in the past 5-10 days? No

## 2021-06-24 ENCOUNTER — Non-Acute Institutional Stay: Payer: Medicare Other | Admitting: Student

## 2021-06-24 ENCOUNTER — Other Ambulatory Visit: Payer: Self-pay

## 2021-06-24 DIAGNOSIS — F0280 Dementia in other diseases classified elsewhere without behavioral disturbance: Secondary | ICD-10-CM

## 2021-06-24 DIAGNOSIS — F0150 Vascular dementia without behavioral disturbance: Secondary | ICD-10-CM | POA: Diagnosis not present

## 2021-06-24 DIAGNOSIS — Z515 Encounter for palliative care: Secondary | ICD-10-CM

## 2021-06-24 NOTE — Progress Notes (Signed)
[facility] Consult Note Telephone: [phone]  Fax: [phone]    Date of encounter: 06/24/21 4:45 PM PATIENT NAME: [name] [name] [address] [place] [address]   [phone] (home)  DOB: September 01, 1940 MRN: [id number] PRIMARY CARE PROVIDER:    Venia Carbon, MD,  [place] [address] [phone]  REFERRING PROVIDER:   Venia Carbon, MD [address] [place],  [place] [address] [phone]  RESPONSIBLE PARTY:    Contact Information     Name Relation Home Work Mobile   [name] [name] Daughter [phone]  [phone]   [name] [name] Relative [phone]  [phone]        I met face to face with patient and family in the facility. Palliative Care was asked to follow this patient by consultation request of  [name] Carbon, MD to address advance care planning and complex medical decision making. This is a follow up visit.                                   ASSESSMENT AND PLAN / RECOMMENDATIONS:   Advance Care Planning/Goals of Care: Goals include to maximize quality of life and symptom management. Our advance care planning conversation included a discussion about:    The value and importance of advance care planning  Experiences with loved ones who have been seriously ill or have died  Exploration of personal, cultural or spiritual beliefs that might influence medical decisions  Exploration of goals of care in the event of a sudden injury or illness  Decision not to resuscitate or to de-escalate disease focused treatments due to poor prognosis. CODE STATUS: DNR   Education provided on palliative vs. Hospice services. Daughter wishes for comfort care, patient managed at facility, no further hospitalizations. Discussed patient being eligible for hospice. Daughter is in agreement with referral to hospice. Facility asked to fax hospice order to referral center. NP answered  questions about hospice and support they can provide to patient and family.    Symptom Management/Plan:  Alzheimer's dementia-FAST score 38F. Patient dependent for all adl's. She has had a marked functional and cognitive decline. She is no longer eating or drinking. Staff to continue providing supportive care. Patient being referred for hospice evaluation.   Follow up Palliative Care Visit: Palliative care will continue to follow for complex medical decision making, advance care planning, and clarification of goals. Return  prn.  I spent 40 minutes providing this consultation. More than 50% of the time in this consultation was spent in counseling and care coordination.    PPS: 10%  HOSPICE ELIGIBILITY/DIAGNOSIS: Alzheimer's dementia  Chief Complaint: Palliative Medicine follow up visit.  HISTORY OF PRESENT ILLNESS:  [name] [name] is a 80 y.o. year old female  with Alzheimer's dementia, hx of remote right MCA with bilateral cerebellar infarcts, small vessel ischemic changes, T2DM, hyperlipidemia, GERD. Recently in the hospital 11/2-11/01/2021 due to acute metabolic encephalopathy, UTI, AKI on CKD 3b.   Patient with marked functional and cognitive decline. She is no longer speaking, no longer eating. She is refusing all medications. Daughter states patient held her hand over her mouth earlier today and refused to eat or drink. She has increased weakness, now bed bound. She is unable to hold head up. Dependent for all adl's. Very little urine output per staff. PPS was 30% three weeks ago, now a PPS of 10%. Patient  was seen by PCP earlier today; morphine ordered PRN pain, restlessness. Family has agreed to a DNR today and comfort measures and hospice evaluation.    Weights:  112 pounds on 06/19/21 117 pounds 06/04/2021 130.2 pounds 03/19/2021.  Patient received resting in bed. She does not respond to verbal stimulation. She does move around with tactile stimulation. She does not exhibit any  signs of pain or discomfort. HPI and ROS obtained per family and facility staff due to patient's impaired cognition.   History obtained from review of EMR, discussion with primary team, and interview with family, facility staff/caregiver and/or [name] [name].  I reviewed available labs, medications, imaging, studies and related documents from the EMR.  Records reviewed and summarized above.    Physical Exam: Constitutional: NAD, PAINAD-0 General: frail appearing, thin EYES: anicteric sclera, lids intact, no discharge  ENMT: intact hearing, oral mucous membranes moist, dentition intact CV: S1S2, RRR, no LE edema Pulmonary: LCTA, no increased work of breathing, no cough, room air Abdomen: BS + 4 quadrants, soft and non tender GU: deferred MSK: non-ambulatory Skin: warm and dry, no rashes or wounds on visible skin Neuro: generalized weakness,  disoriented Psych: non-anxious affect Hem/lymph/immuno: no widespread bruising   Thank you for the opportunity to participate in the care of [name] [name].  The palliative care team will continue to follow. Please call our office at [phone] if we can be of additional assistance.   [name] Slocumb, NP   COVID-19 PATIENT SCREENING TOOL Asked and negative response unless otherwise noted:   Have you had symptoms of covid, tested positive or been in contact with someone with symptoms/positive test in the past 5-10 days?  No

## 2021-07-20 DEATH — deceased

## 2022-01-06 IMAGING — CT CT HEAD W/O CM
3 series · 16 of 47 positions shown, 19 images · non-contrast
Comparison: /[DATE]

CLINICAL DATA: Mental status changes.  Unresponsive.

EXAM:
CT HEAD WITHOUT CONTRAST
TECHNIQUE: Contiguous axial images were obtained from the base of the skull
through the vertex without intravenous contrast.

[Series 3: head wo · axial · 0.41mm/px · z∈[-73,+52]mm · 10 of 31 slices shown, 13 images]
[im 3/31  brain]
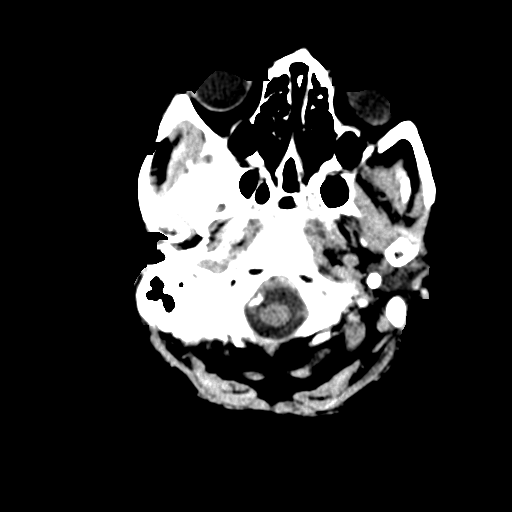
[im 3/31  bone]
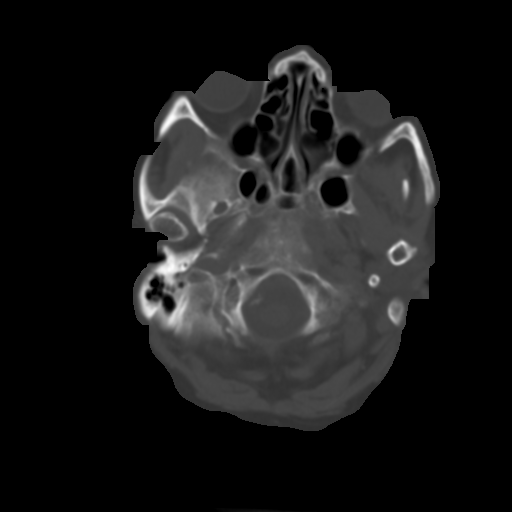
[im 6/31  brain]
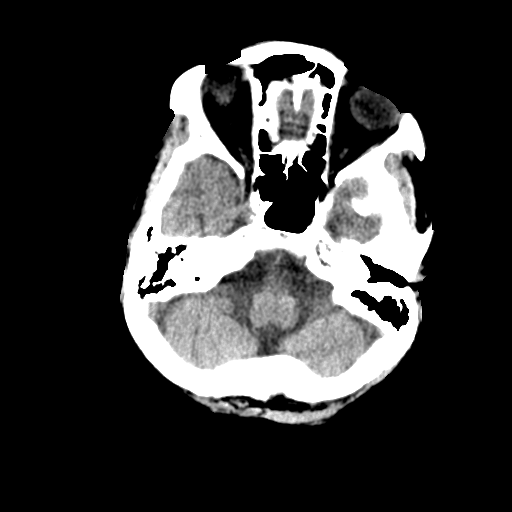
[im 9/31  brain]
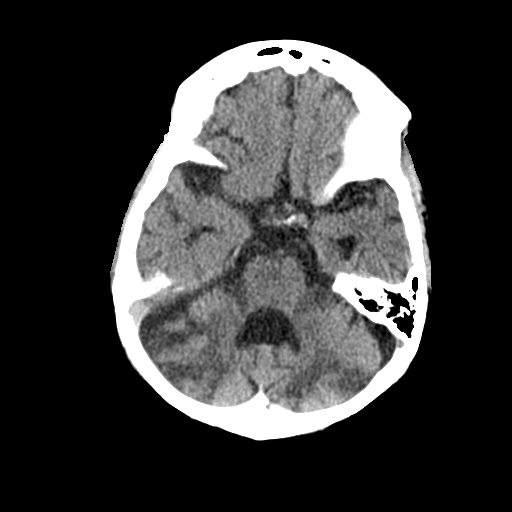
[im 11/31  brain]
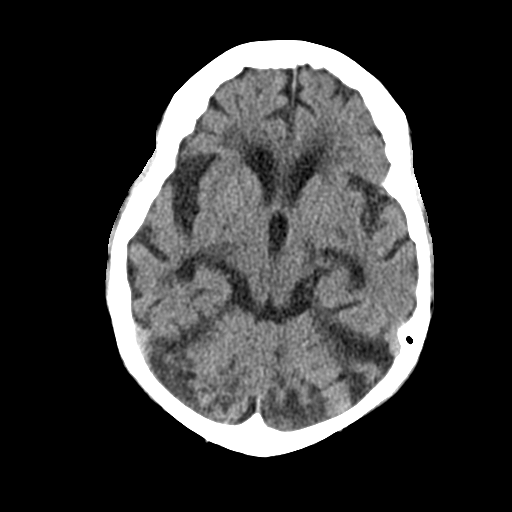
[im 14/31  brain]
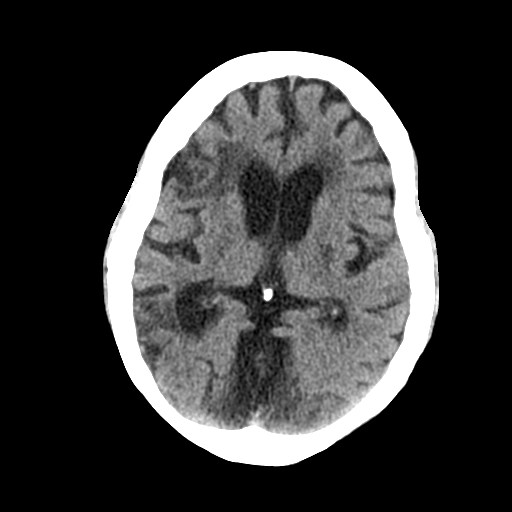
[im 14/31  bone]
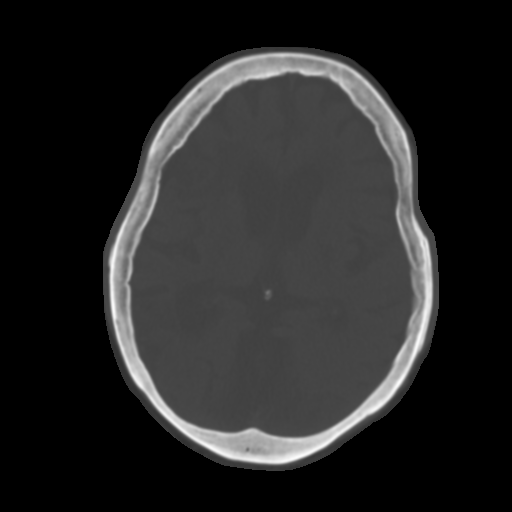
[im 17/31  brain]
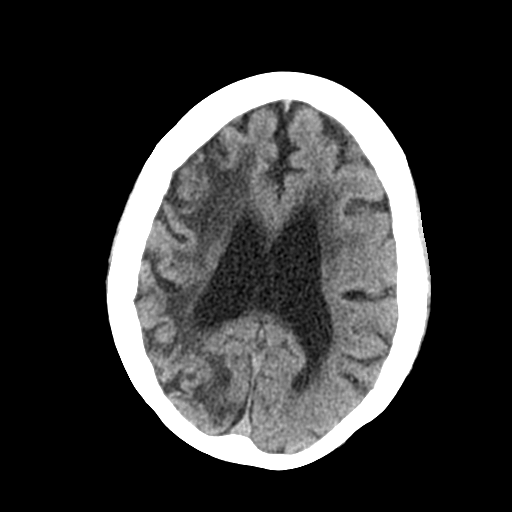
[im 20/31  brain]
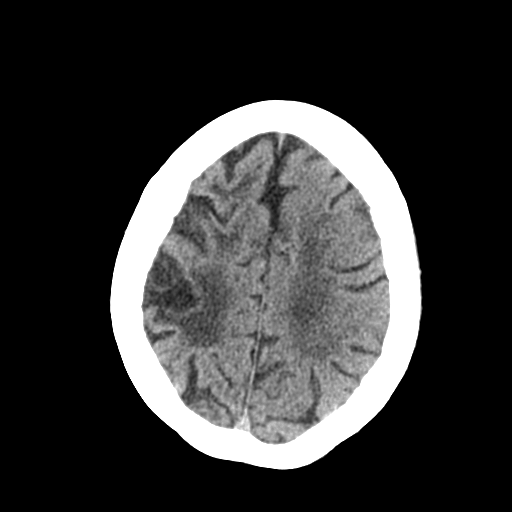
[im 23/31  brain]
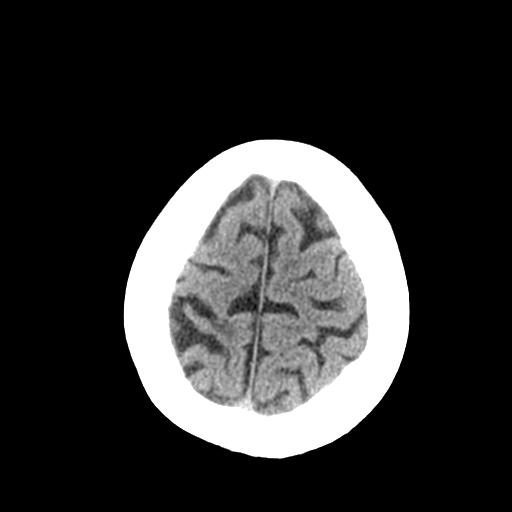
[im 25/31  brain]
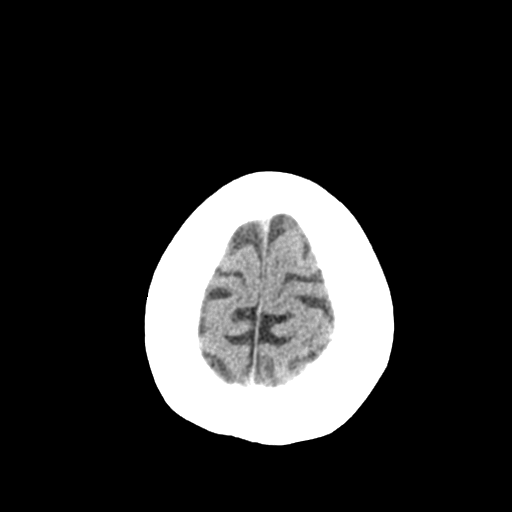
[im 25/31  bone]
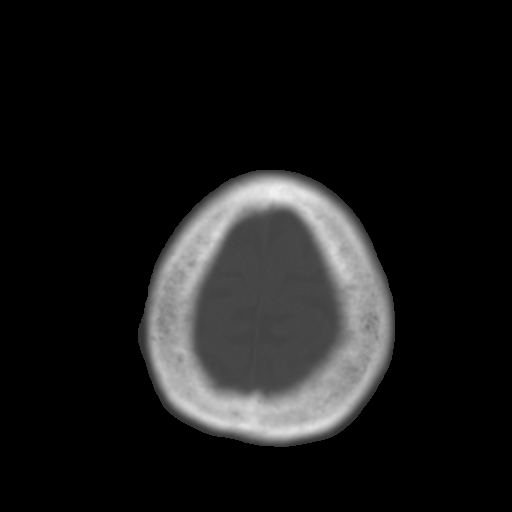
[im 28/31  brain]
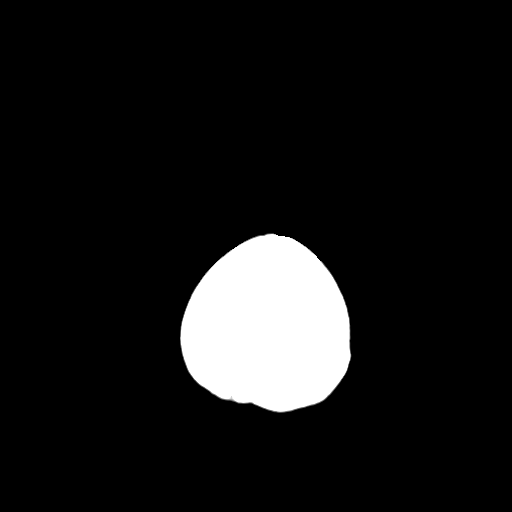

[Series 4: coronal soft tissue · coronal · 0.29mm/px · 3 of 63 slices shown]
[im 21/63  brain]
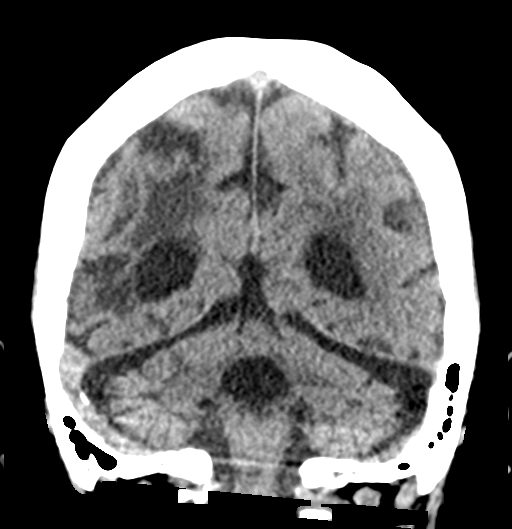
[im 28/63  brain]
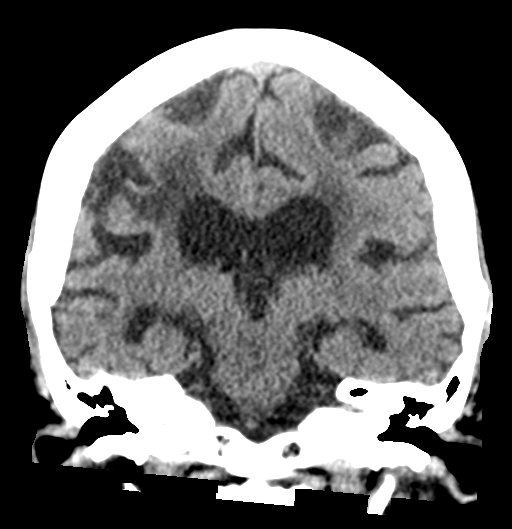
[im 35/63  brain]
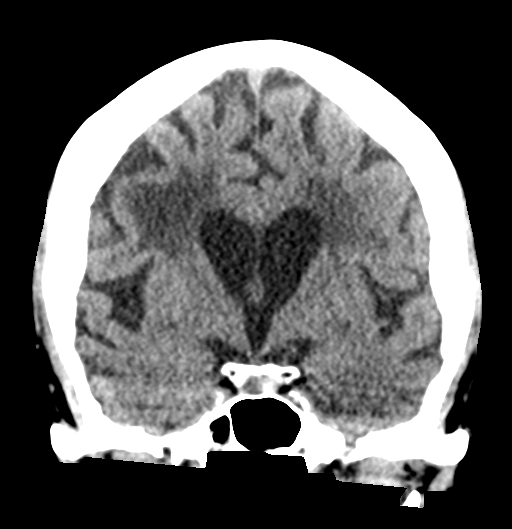

[Series 5: sagittal soft tissue · sagittal · 0.30mm/px · 3 of 51 slices shown]
[im 17/51  brain]
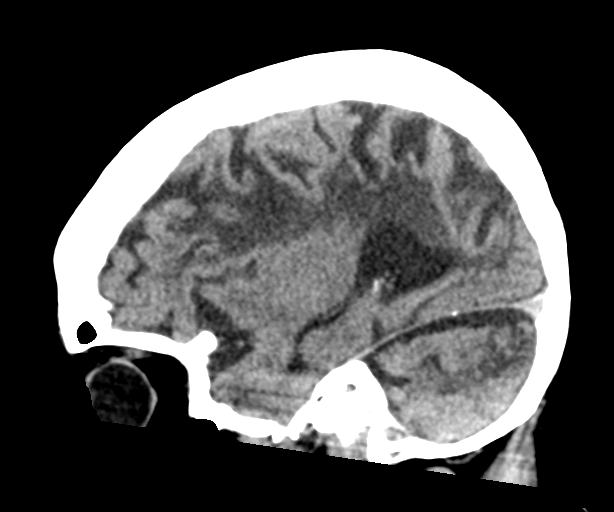
[im 26/51  brain]
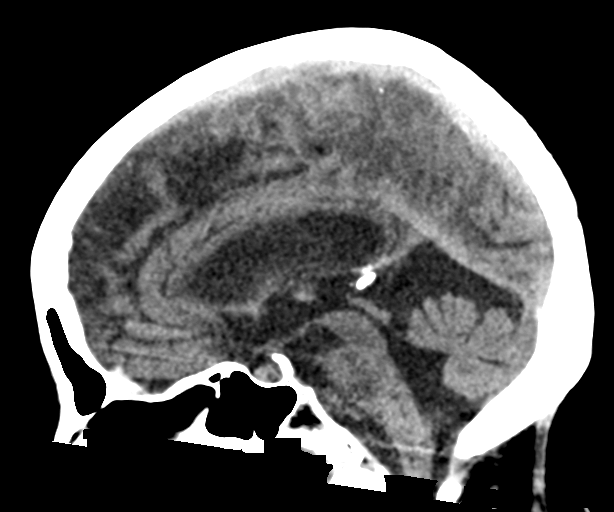
[im 34/51  brain]
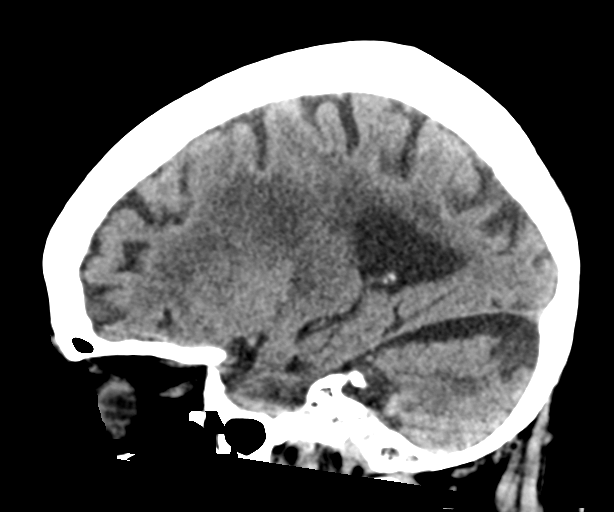

[16 of 47 positions shown; findings below may reference images not displayed]

FINDINGS: Brain: Advanced cerebral and cerebellar atrophy. Resultant
ventriculomegaly.

Again identified is a right MCA remote infarct. Multiple bilateral
remote cerebellar infarcts also identified.

Moderate to marked low density in the periventricular white matter
likely related to small vessel disease.

No acute hemorrhage, cortically based infarct, intra-axial, or
extra-axial fluid collection.

Vascular: Intracranial atherosclerosis.

Skull: Hyperostosis frontalis interna.

Sinuses/Orbits: Normal imaged portions of the orbits and globes.
Clear paranasal sinuses and mastoid air cells.

Other: None.
IMPRESSION: 1. No acute intracranial abnormality.
2. Cerebral atrophy and small vessel ischemic change.
3. Remote right MCA and bilateral cerebellar infarcts.

## 2022-01-06 IMAGING — DX DG CHEST 1V PORT
1 series · 1 of 1 positions shown · non-contrast
Comparison: May 28, 2019

CLINICAL DATA: Altered mental status.  79-year-old female.

EXAM:
PORTABLE CHEST 1 VIEW

[chest ap]
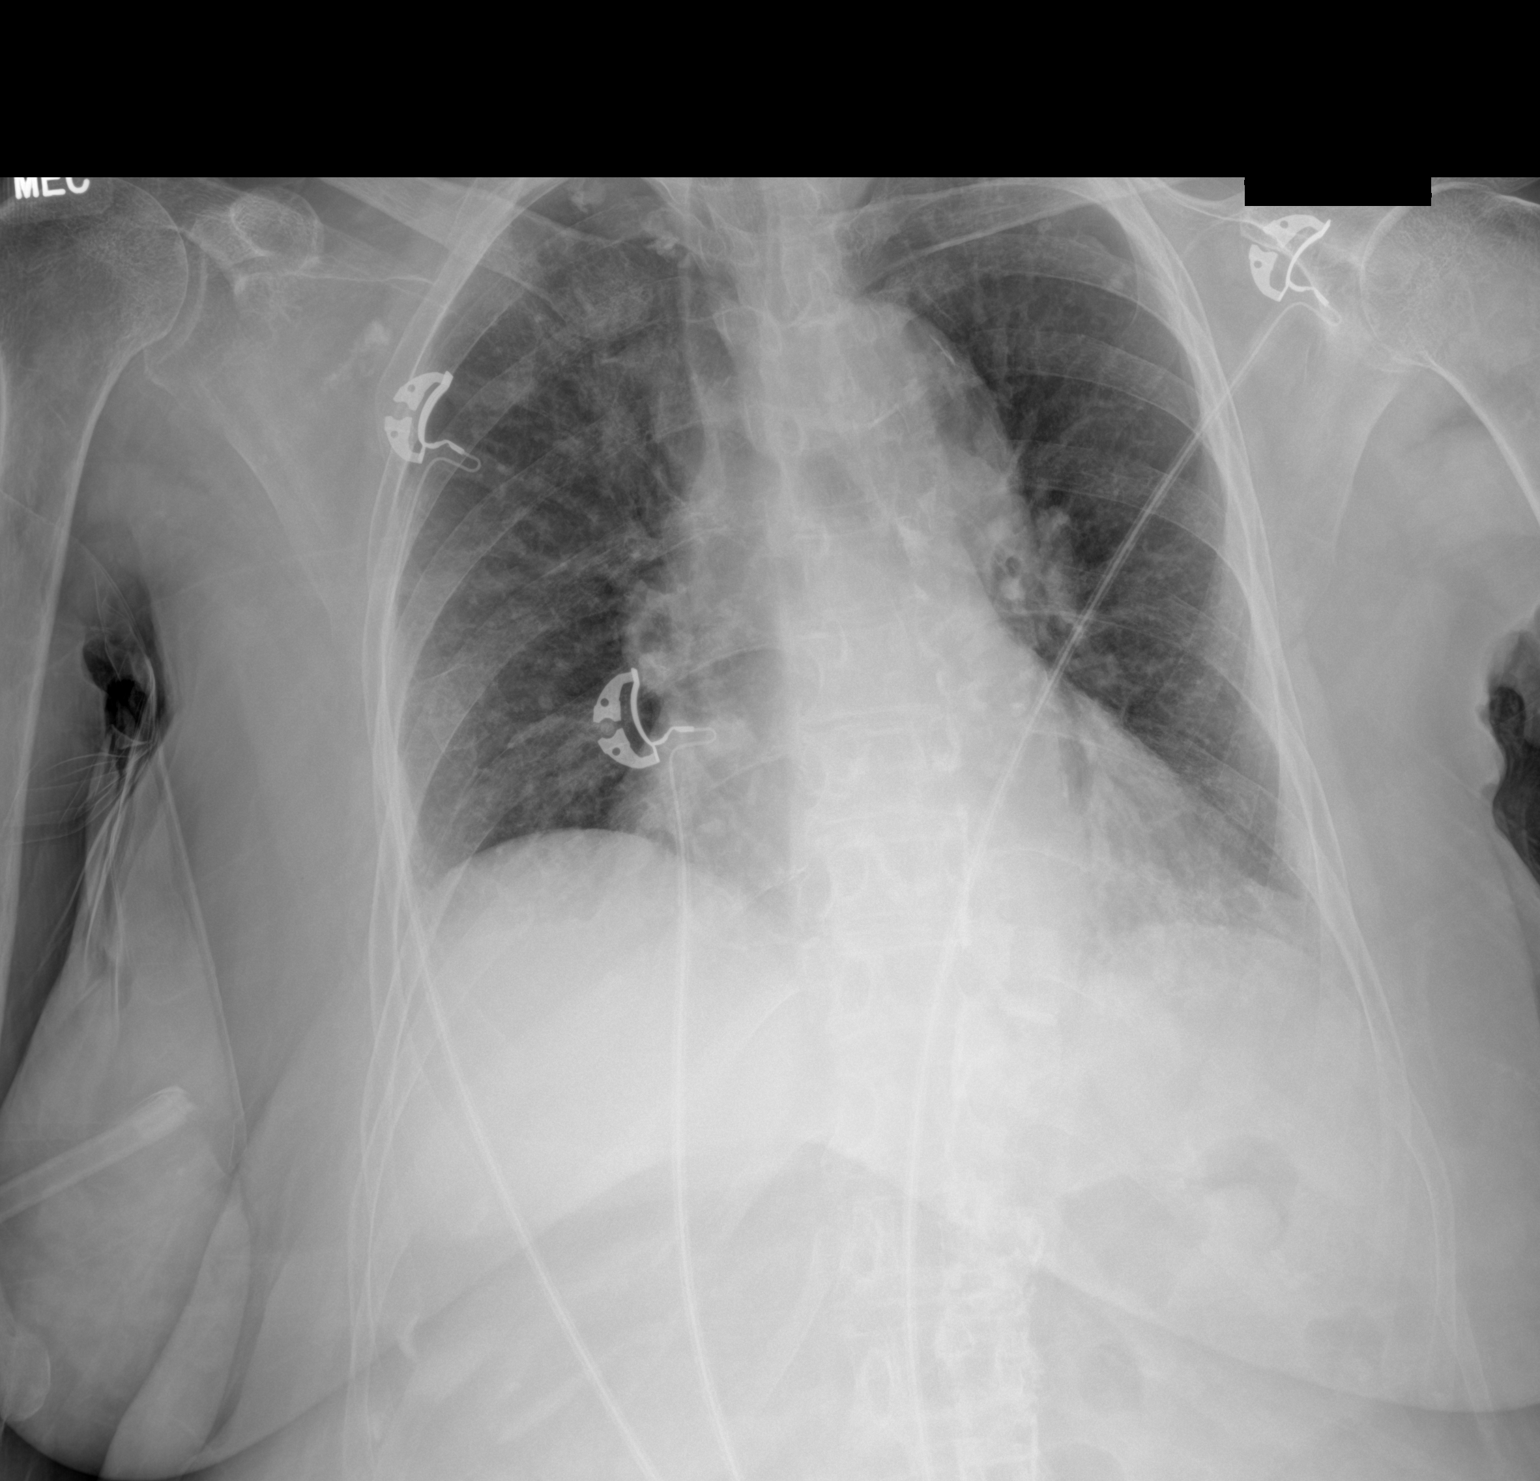

[1 of 1 positions shown; findings below may reference images not displayed]

FINDINGS: EKG leads project over the chest.

Image rotated to the RIGHT.

Heart size and mediastinal contours are stable. There is no lobar
consolidative process. No visible pneumothorax. Mild increased
density at the LEFT lung base but with improved aeration in this
area since previous imaging.

On limited assessment there is no acute skeletal process.
IMPRESSION: Increased aeration since a remote prior at the LEFT lung base,
persistent opacity may represent atelectasis or developing
infection.

Otherwise stable appearance of the chest.
